# Patient Record
Sex: Female | Born: 1935
Health system: Southern US, Community
[De-identification: ages and names within clinical notes are randomized; demographics above are authoritative.]

## PROBLEM LIST (undated history)

## (undated) DIAGNOSIS — N289 Disorder of kidney and ureter, unspecified: Secondary | ICD-10-CM

## (undated) DIAGNOSIS — R0609 Other forms of dyspnea: Secondary | ICD-10-CM

## (undated) DIAGNOSIS — I10 Essential (primary) hypertension: Secondary | ICD-10-CM

## (undated) DIAGNOSIS — K449 Diaphragmatic hernia without obstruction or gangrene: Secondary | ICD-10-CM

## (undated) DIAGNOSIS — R112 Nausea with vomiting, unspecified: Secondary | ICD-10-CM

## (undated) DIAGNOSIS — J189 Pneumonia, unspecified organism: Secondary | ICD-10-CM

## (undated) DIAGNOSIS — K219 Gastro-esophageal reflux disease without esophagitis: Secondary | ICD-10-CM

## (undated) DIAGNOSIS — C9110 Chronic lymphocytic leukemia of B-cell type not having achieved remission: Secondary | ICD-10-CM

## (undated) DIAGNOSIS — J302 Other seasonal allergic rhinitis: Secondary | ICD-10-CM

## (undated) DIAGNOSIS — E785 Hyperlipidemia, unspecified: Secondary | ICD-10-CM

## (undated) DIAGNOSIS — Z973 Presence of spectacles and contact lenses: Secondary | ICD-10-CM

## (undated) DIAGNOSIS — R351 Nocturia: Secondary | ICD-10-CM

## (undated) DIAGNOSIS — M1990 Unspecified osteoarthritis, unspecified site: Secondary | ICD-10-CM

## (undated) DIAGNOSIS — E039 Hypothyroidism, unspecified: Secondary | ICD-10-CM

## (undated) DIAGNOSIS — N390 Urinary tract infection, site not specified: Secondary | ICD-10-CM

## (undated) DIAGNOSIS — Z9889 Other specified postprocedural states: Secondary | ICD-10-CM

## (undated) DIAGNOSIS — K529 Noninfective gastroenteritis and colitis, unspecified: Secondary | ICD-10-CM

## (undated) DIAGNOSIS — N200 Calculus of kidney: Secondary | ICD-10-CM

## (undated) DIAGNOSIS — Z8489 Family history of other specified conditions: Secondary | ICD-10-CM

## (undated) DIAGNOSIS — IMO0001 Reserved for inherently not codable concepts without codable children: Secondary | ICD-10-CM

## (undated) DIAGNOSIS — R7989 Other specified abnormal findings of blood chemistry: Secondary | ICD-10-CM

## (undated) DIAGNOSIS — R0600 Dyspnea, unspecified: Secondary | ICD-10-CM

## (undated) DIAGNOSIS — C4490 Unspecified malignant neoplasm of skin, unspecified: Secondary | ICD-10-CM

## (undated) HISTORY — DX: Other specified abnormal findings of blood chemistry: R79.89

## (undated) HISTORY — PX: ESOPHAGOGASTRODUODENOSCOPY: SHX1529

## (undated) HISTORY — PX: WISDOM TOOTH EXTRACTION: SHX21

## (undated) HISTORY — DX: Pneumonia, unspecified organism: J18.9

## (undated) HISTORY — DX: Essential (primary) hypertension: I10

## (undated) HISTORY — PX: CYSTOSCOPY W/ STONE MANIPULATION: SHX1427

## (undated) HISTORY — DX: Nocturia: R35.1

## (undated) HISTORY — PX: CYSTOSCOPY WITH STENT PLACEMENT: SHX5790

## (undated) HISTORY — PX: SKIN CANCER EXCISION: SHX779

## (undated) HISTORY — PX: ESOPHAGOGASTRODUODENOSCOPY (EGD) WITH ESOPHAGEAL DILATION: SHX5812

## (undated) HISTORY — DX: Diaphragmatic hernia without obstruction or gangrene: K44.9

## (undated) HISTORY — PX: TONSILLECTOMY: SUR1361

## (undated) HISTORY — DX: Dyspnea, unspecified: R06.00

## (undated) HISTORY — PX: LITHOTRIPSY: SUR834

## (undated) HISTORY — DX: Calculus of kidney: N20.0

## (undated) HISTORY — DX: Other forms of dyspnea: R06.09

## (undated) HISTORY — DX: Noninfective gastroenteritis and colitis, unspecified: K52.9

## (undated) HISTORY — PX: COLONOSCOPY: SHX174

## (undated) HISTORY — DX: Hyperlipidemia, unspecified: E78.5

## (undated) HISTORY — PX: KNEE ARTHROSCOPY: SUR90

---

## 1998-08-25 ENCOUNTER — Emergency Department (HOSPITAL_COMMUNITY): Admission: EM | Admit: 1998-08-25 | Discharge: 1998-08-25 | Payer: Self-pay | Admitting: Emergency Medicine

## 1998-09-19 ENCOUNTER — Other Ambulatory Visit: Admission: RE | Admit: 1998-09-19 | Discharge: 1998-09-19 | Payer: Self-pay | Admitting: *Deleted

## 1998-09-26 ENCOUNTER — Encounter: Payer: Self-pay | Admitting: *Deleted

## 1998-09-26 ENCOUNTER — Ambulatory Visit (HOSPITAL_COMMUNITY): Admission: RE | Admit: 1998-09-26 | Discharge: 1998-09-26 | Payer: Self-pay | Admitting: *Deleted

## 2001-02-20 ENCOUNTER — Ambulatory Visit (HOSPITAL_COMMUNITY): Admission: RE | Admit: 2001-02-20 | Discharge: 2001-02-20 | Payer: Self-pay | Admitting: *Deleted

## 2001-02-20 ENCOUNTER — Encounter: Payer: Self-pay | Admitting: *Deleted

## 2002-12-10 ENCOUNTER — Ambulatory Visit (HOSPITAL_COMMUNITY): Admission: RE | Admit: 2002-12-10 | Discharge: 2002-12-10 | Payer: Self-pay | Admitting: Family Medicine

## 2002-12-10 ENCOUNTER — Encounter: Payer: Self-pay | Admitting: Family Medicine

## 2008-04-26 ENCOUNTER — Emergency Department (HOSPITAL_COMMUNITY): Admission: EM | Admit: 2008-04-26 | Discharge: 2008-04-26 | Payer: Self-pay | Admitting: Emergency Medicine

## 2008-06-24 HISTORY — PX: CATARACT EXTRACTION, BILATERAL: SHX1313

## 2008-08-12 ENCOUNTER — Encounter: Admission: RE | Admit: 2008-08-12 | Discharge: 2008-08-12 | Payer: Self-pay | Admitting: Family Medicine

## 2009-03-01 ENCOUNTER — Other Ambulatory Visit: Admission: RE | Admit: 2009-03-01 | Discharge: 2009-03-01 | Payer: Self-pay | Admitting: Interventional Radiology

## 2009-03-01 ENCOUNTER — Encounter: Admission: RE | Admit: 2009-03-01 | Discharge: 2009-03-01 | Payer: Self-pay | Admitting: Family Medicine

## 2009-03-01 ENCOUNTER — Encounter (INDEPENDENT_AMBULATORY_CARE_PROVIDER_SITE_OTHER): Payer: Self-pay | Admitting: Interventional Radiology

## 2009-04-10 ENCOUNTER — Encounter (HOSPITAL_COMMUNITY): Admission: RE | Admit: 2009-04-10 | Discharge: 2009-06-22 | Payer: Self-pay | Admitting: Internal Medicine

## 2009-09-19 ENCOUNTER — Encounter: Payer: Self-pay | Admitting: Cardiology

## 2010-07-15 ENCOUNTER — Encounter: Payer: Self-pay | Admitting: Family Medicine

## 2010-09-23 DIAGNOSIS — K529 Noninfective gastroenteritis and colitis, unspecified: Secondary | ICD-10-CM

## 2010-09-23 HISTORY — DX: Noninfective gastroenteritis and colitis, unspecified: K52.9

## 2010-09-30 ENCOUNTER — Emergency Department (HOSPITAL_COMMUNITY): Payer: Medicare Other

## 2010-09-30 ENCOUNTER — Emergency Department (HOSPITAL_COMMUNITY)
Admission: EM | Admit: 2010-09-30 | Discharge: 2010-09-30 | Disposition: A | Discharge status: Home / Self Care | Payer: Medicare Other | Attending: Emergency Medicine | Admitting: Emergency Medicine

## 2010-09-30 DIAGNOSIS — K219 Gastro-esophageal reflux disease without esophagitis: Secondary | ICD-10-CM | POA: Insufficient documentation

## 2010-09-30 DIAGNOSIS — Q619 Cystic kidney disease, unspecified: Secondary | ICD-10-CM | POA: Insufficient documentation

## 2010-09-30 DIAGNOSIS — N390 Urinary tract infection, site not specified: Secondary | ICD-10-CM | POA: Insufficient documentation

## 2010-09-30 DIAGNOSIS — K449 Diaphragmatic hernia without obstruction or gangrene: Secondary | ICD-10-CM | POA: Insufficient documentation

## 2010-09-30 DIAGNOSIS — R109 Unspecified abdominal pain: Secondary | ICD-10-CM | POA: Insufficient documentation

## 2010-09-30 DIAGNOSIS — E785 Hyperlipidemia, unspecified: Secondary | ICD-10-CM | POA: Insufficient documentation

## 2010-09-30 DIAGNOSIS — K5730 Diverticulosis of large intestine without perforation or abscess without bleeding: Secondary | ICD-10-CM | POA: Insufficient documentation

## 2010-09-30 DIAGNOSIS — R110 Nausea: Secondary | ICD-10-CM | POA: Insufficient documentation

## 2010-09-30 DIAGNOSIS — I10 Essential (primary) hypertension: Secondary | ICD-10-CM | POA: Insufficient documentation

## 2010-09-30 LAB — CBC
HCT: 35.6 % — ABNORMAL LOW (ref 36.0–46.0)
MCV: 89.9 fL (ref 78.0–100.0)
RBC: 3.96 MIL/uL (ref 3.87–5.11)
WBC: 8.6 10*3/uL (ref 4.0–10.5)

## 2010-09-30 LAB — BASIC METABOLIC PANEL
Calcium: 9.1 mg/dL (ref 8.4–10.5)
GFR calc Af Amer: 59 mL/min — ABNORMAL LOW (ref >60)
GFR calc non Af Amer: 49 mL/min — ABNORMAL LOW (ref >60)
Sodium: 133 mEq/L — ABNORMAL LOW (ref 135–145)

## 2010-09-30 LAB — URINALYSIS, ROUTINE W REFLEX MICROSCOPIC
Bilirubin Urine: NEGATIVE
Glucose, UA: NEGATIVE mg/dL
Hgb urine dipstick: NEGATIVE
Specific Gravity, Urine: 1.012 (ref 1.005–1.030)
pH: 7 (ref 5.0–8.0)

## 2010-09-30 LAB — URINE MICROSCOPIC-ADD ON

## 2010-09-30 LAB — DIFFERENTIAL
Eosinophils Relative: 7 % — ABNORMAL HIGH (ref 0–5)
Lymphocytes Relative: 36 % (ref 12–46)
Lymphs Abs: 3.1 10*3/uL (ref 0.7–4.0)
Neutrophils Relative %: 47 % (ref 43–77)

## 2010-10-01 LAB — URINE CULTURE: Culture  Setup Time: 201204081135

## 2010-10-06 ENCOUNTER — Emergency Department (HOSPITAL_COMMUNITY): Payer: Medicare Other

## 2010-10-06 ENCOUNTER — Inpatient Hospital Stay (HOSPITAL_COMMUNITY)
Admission: EM | Admit: 2010-10-06 | Discharge: 2010-10-13 | DRG: 871 | Disposition: A | Discharge status: Home / Self Care | Payer: Medicare Other | Attending: Internal Medicine | Admitting: Internal Medicine

## 2010-10-06 DIAGNOSIS — R5381 Other malaise: Secondary | ICD-10-CM | POA: Diagnosis present

## 2010-10-06 DIAGNOSIS — A414 Sepsis due to anaerobes: Principal | ICD-10-CM | POA: Diagnosis present

## 2010-10-06 DIAGNOSIS — R109 Unspecified abdominal pain: Secondary | ICD-10-CM | POA: Diagnosis present

## 2010-10-06 DIAGNOSIS — Z7982 Long term (current) use of aspirin: Secondary | ICD-10-CM

## 2010-10-06 DIAGNOSIS — Z87440 Personal history of urinary (tract) infections: Secondary | ICD-10-CM

## 2010-10-06 DIAGNOSIS — D72829 Elevated white blood cell count, unspecified: Secondary | ICD-10-CM | POA: Diagnosis present

## 2010-10-06 DIAGNOSIS — R6521 Severe sepsis with septic shock: Secondary | ICD-10-CM | POA: Diagnosis present

## 2010-10-06 DIAGNOSIS — R4182 Altered mental status, unspecified: Secondary | ICD-10-CM | POA: Diagnosis present

## 2010-10-06 DIAGNOSIS — A419 Sepsis, unspecified organism: Secondary | ICD-10-CM | POA: Diagnosis present

## 2010-10-06 DIAGNOSIS — A0472 Enterocolitis due to Clostridium difficile, not specified as recurrent: Secondary | ICD-10-CM | POA: Diagnosis present

## 2010-10-06 DIAGNOSIS — K219 Gastro-esophageal reflux disease without esophagitis: Secondary | ICD-10-CM | POA: Diagnosis present

## 2010-10-06 DIAGNOSIS — E872 Acidosis, unspecified: Secondary | ICD-10-CM | POA: Diagnosis present

## 2010-10-06 DIAGNOSIS — E860 Dehydration: Secondary | ICD-10-CM | POA: Diagnosis present

## 2010-10-06 DIAGNOSIS — E876 Hypokalemia: Secondary | ICD-10-CM | POA: Diagnosis present

## 2010-10-06 DIAGNOSIS — I10 Essential (primary) hypertension: Secondary | ICD-10-CM | POA: Diagnosis present

## 2010-10-06 DIAGNOSIS — N179 Acute kidney failure, unspecified: Secondary | ICD-10-CM | POA: Diagnosis present

## 2010-10-06 DIAGNOSIS — N390 Urinary tract infection, site not specified: Secondary | ICD-10-CM | POA: Diagnosis present

## 2010-10-06 DIAGNOSIS — D649 Anemia, unspecified: Secondary | ICD-10-CM | POA: Diagnosis not present

## 2010-10-06 DIAGNOSIS — Z9849 Cataract extraction status, unspecified eye: Secondary | ICD-10-CM

## 2010-10-06 LAB — URINALYSIS, ROUTINE W REFLEX MICROSCOPIC
Bilirubin Urine: NEGATIVE
Hgb urine dipstick: NEGATIVE
Ketones, ur: 15 mg/dL — AB
Nitrite: NEGATIVE
pH: 5.5 (ref 5.0–8.0)

## 2010-10-06 LAB — CBC
HCT: 35.6 % — ABNORMAL LOW (ref 36.0–46.0)
MCH: 31 pg (ref 26.0–34.0)
MCV: 90.4 fL (ref 78.0–100.0)
Platelets: 247 10*3/uL (ref 150–400)
RBC: 3.94 MIL/uL (ref 3.87–5.11)

## 2010-10-06 LAB — DIFFERENTIAL
Eosinophils Absolute: 0 10*3/uL (ref 0.0–0.7)
Eosinophils Relative: 0 % (ref 0–5)
Lymphocytes Relative: 12 % (ref 12–46)
Lymphs Abs: 2.8 10*3/uL (ref 0.7–4.0)
Monocytes Relative: 8 % (ref 3–12)
Neutrophils Relative %: 80 % — ABNORMAL HIGH (ref 43–77)

## 2010-10-06 LAB — COMPREHENSIVE METABOLIC PANEL
ALT: 23 U/L (ref 0–35)
AST: 33 U/L (ref 0–37)
Albumin: 3.6 g/dL (ref 3.5–5.2)
Calcium: 9.7 mg/dL (ref 8.4–10.5)
Creatinine, Ser: 1.71 mg/dL — ABNORMAL HIGH (ref 0.4–1.2)
GFR calc Af Amer: 35 mL/min — ABNORMAL LOW (ref >60)
Sodium: 134 mEq/L — ABNORMAL LOW (ref 135–145)
Total Protein: 7.4 g/dL (ref 6.0–8.3)

## 2010-10-06 LAB — URINE MICROSCOPIC-ADD ON

## 2010-10-07 ENCOUNTER — Inpatient Hospital Stay (HOSPITAL_COMMUNITY): Payer: Medicare Other

## 2010-10-07 LAB — URINE CULTURE: Colony Count: 25000

## 2010-10-07 LAB — CBC
HCT: 30.3 % — ABNORMAL LOW (ref 36.0–46.0)
Hemoglobin: 10.2 g/dL — ABNORMAL LOW (ref 12.0–15.0)
MCHC: 33.7 g/dL (ref 30.0–36.0)
MCV: 89.4 fL (ref 78.0–100.0)
RDW: 13.1 % (ref 11.5–15.5)

## 2010-10-07 LAB — COMPREHENSIVE METABOLIC PANEL
ALT: 18 U/L (ref 0–35)
Alkaline Phosphatase: 69 U/L (ref 39–117)
BUN: 21 mg/dL (ref 6–23)
CO2: 19 mEq/L (ref 19–32)
Calcium: 8.6 mg/dL (ref 8.4–10.5)
GFR calc non Af Amer: 36 mL/min — ABNORMAL LOW (ref >60)
Glucose, Bld: 96 mg/dL (ref 70–99)
Sodium: 134 mEq/L — ABNORMAL LOW (ref 135–145)
Total Protein: 6 g/dL (ref 6.0–8.3)

## 2010-10-07 LAB — PHOSPHORUS: Phosphorus: 2.5 mg/dL (ref 2.3–4.6)

## 2010-10-07 LAB — TSH: TSH: 0.85 u[IU]/mL (ref 0.350–4.500)

## 2010-10-07 LAB — MAGNESIUM: Magnesium: 1.2 mg/dL — ABNORMAL LOW (ref 1.5–2.5)

## 2010-10-08 ENCOUNTER — Inpatient Hospital Stay (HOSPITAL_COMMUNITY): Payer: Medicare Other

## 2010-10-08 DIAGNOSIS — R6520 Severe sepsis without septic shock: Secondary | ICD-10-CM

## 2010-10-08 DIAGNOSIS — A0472 Enterocolitis due to Clostridium difficile, not specified as recurrent: Secondary | ICD-10-CM

## 2010-10-08 DIAGNOSIS — R6521 Severe sepsis with septic shock: Secondary | ICD-10-CM

## 2010-10-08 DIAGNOSIS — A419 Sepsis, unspecified organism: Secondary | ICD-10-CM

## 2010-10-08 LAB — COMPREHENSIVE METABOLIC PANEL
Alkaline Phosphatase: 59 U/L (ref 39–117)
BUN: 16 mg/dL (ref 6–23)
Chloride: 114 mEq/L — ABNORMAL HIGH (ref 96–112)
Glucose, Bld: 97 mg/dL (ref 70–99)
Potassium: 3.9 mEq/L (ref 3.5–5.1)
Total Bilirubin: 0.4 mg/dL (ref 0.3–1.2)

## 2010-10-08 LAB — D-DIMER, QUANTITATIVE: D-Dimer, Quant: 1.76 ug/mL-FEU — ABNORMAL HIGH (ref 0.00–0.48)

## 2010-10-08 LAB — DIFFERENTIAL
Eosinophils Absolute: 0 10*3/uL (ref 0.0–0.7)
Lymphocytes Relative: 12 % (ref 12–46)
Monocytes Absolute: 1.9 10*3/uL — ABNORMAL HIGH (ref 0.1–1.0)
Neutrophils Relative %: 80 % — ABNORMAL HIGH (ref 43–77)
WBC Morphology: INCREASED

## 2010-10-08 LAB — BASIC METABOLIC PANEL
CO2: 16 mEq/L — ABNORMAL LOW (ref 19–32)
Chloride: 107 mEq/L (ref 96–112)
Creatinine, Ser: 1.53 mg/dL — ABNORMAL HIGH (ref 0.4–1.2)
GFR calc Af Amer: 39 mL/min — ABNORMAL LOW (ref >60)
Potassium: 3.8 mEq/L (ref 3.5–5.1)
Sodium: 134 mEq/L — ABNORMAL LOW (ref 135–145)

## 2010-10-08 LAB — BRAIN NATRIURETIC PEPTIDE: Pro B Natriuretic peptide (BNP): 202 pg/mL — ABNORMAL HIGH (ref 0.0–100.0)

## 2010-10-08 LAB — APTT: aPTT: 31 seconds (ref 24–37)

## 2010-10-08 LAB — LIPASE, BLOOD: Lipase: 24 U/L (ref 11–59)

## 2010-10-08 LAB — LACTIC ACID, PLASMA: Lactic Acid, Venous: 1.6 mmol/L (ref 0.5–2.2)

## 2010-10-08 LAB — BLOOD GAS, ARTERIAL
Drawn by: 33234
FIO2: 0.21 %
O2 Saturation: 98.7 %
Patient temperature: 98.6

## 2010-10-08 LAB — PROTIME-INR: Prothrombin Time: 16.9 seconds — ABNORMAL HIGH (ref 11.6–15.2)

## 2010-10-08 LAB — CBC
Hemoglobin: 9.9 g/dL — ABNORMAL LOW (ref 12.0–15.0)
Platelets: 188 10*3/uL (ref 150–400)
RBC: 3.25 MIL/uL — ABNORMAL LOW (ref 3.87–5.11)
WBC: 23.9 10*3/uL — ABNORMAL HIGH (ref 4.0–10.5)

## 2010-10-08 LAB — AMYLASE: Amylase: 34 U/L (ref 0–105)

## 2010-10-08 LAB — CARDIAC PANEL(CRET KIN+CKTOT+MB+TROPI): Troponin I: 0.01 ng/mL (ref 0.00–0.06)

## 2010-10-08 LAB — CARBOXYHEMOGLOBIN: Total hemoglobin: 8.3 g/dL — ABNORMAL LOW (ref 12.5–16.0)

## 2010-10-09 DIAGNOSIS — R6521 Severe sepsis with septic shock: Secondary | ICD-10-CM

## 2010-10-09 DIAGNOSIS — R6520 Severe sepsis without septic shock: Secondary | ICD-10-CM

## 2010-10-09 DIAGNOSIS — A0472 Enterocolitis due to Clostridium difficile, not specified as recurrent: Secondary | ICD-10-CM

## 2010-10-09 DIAGNOSIS — A419 Sepsis, unspecified organism: Secondary | ICD-10-CM

## 2010-10-09 LAB — DIFFERENTIAL
Basophils Relative: 0 % (ref 0–1)
Lymphs Abs: 3.9 10*3/uL (ref 0.7–4.0)
Monocytes Relative: 7 % (ref 3–12)
Neutro Abs: 17.2 10*3/uL — ABNORMAL HIGH (ref 1.7–7.7)
Neutrophils Relative %: 74 % (ref 43–77)

## 2010-10-09 LAB — BASIC METABOLIC PANEL
CO2: 14 mEq/L — ABNORMAL LOW (ref 19–32)
Chloride: 117 mEq/L — ABNORMAL HIGH (ref 96–112)
Creatinine, Ser: 1.12 mg/dL (ref 0.4–1.2)
GFR calc Af Amer: 56 mL/min — ABNORMAL LOW (ref >60)
Potassium: 3.5 mEq/L (ref 3.5–5.1)
Sodium: 138 mEq/L (ref 135–145)

## 2010-10-09 LAB — CBC
Hemoglobin: 9.9 g/dL — ABNORMAL LOW (ref 12.0–15.0)
MCH: 30.2 pg (ref 26.0–34.0)
RBC: 3.28 MIL/uL — ABNORMAL LOW (ref 3.87–5.11)
WBC: 23.1 10*3/uL — ABNORMAL HIGH (ref 4.0–10.5)

## 2010-10-10 ENCOUNTER — Inpatient Hospital Stay (HOSPITAL_COMMUNITY): Payer: Medicare Other

## 2010-10-10 LAB — COMPREHENSIVE METABOLIC PANEL
ALT: 24 U/L (ref 0–35)
Alkaline Phosphatase: 65 U/L (ref 39–117)
BUN: 9 mg/dL (ref 6–23)
CO2: 17 mEq/L — ABNORMAL LOW (ref 19–32)
GFR calc non Af Amer: 57 mL/min — ABNORMAL LOW (ref >60)
Glucose, Bld: 83 mg/dL (ref 70–99)
Potassium: 3.4 mEq/L — ABNORMAL LOW (ref 3.5–5.1)
Sodium: 140 mEq/L (ref 135–145)
Total Bilirubin: 0.5 mg/dL (ref 0.3–1.2)

## 2010-10-10 LAB — MAGNESIUM: Magnesium: 1 mg/dL — ABNORMAL LOW (ref 1.5–2.5)

## 2010-10-10 LAB — PHOSPHORUS: Phosphorus: 2 mg/dL — ABNORMAL LOW (ref 2.3–4.6)

## 2010-10-10 LAB — DIFFERENTIAL
Eosinophils Absolute: 0.5 10*3/uL (ref 0.0–0.7)
Eosinophils Relative: 5 % (ref 0–5)
Lymphocytes Relative: 31 % (ref 12–46)
Lymphs Abs: 3.5 10*3/uL (ref 0.7–4.0)
Monocytes Relative: 7 % (ref 3–12)
Neutrophils Relative %: 57 % (ref 43–77)

## 2010-10-10 LAB — CBC
HCT: 26.1 % — ABNORMAL LOW (ref 36.0–46.0)
MCH: 30.4 pg (ref 26.0–34.0)
MCV: 89.1 fL (ref 78.0–100.0)
RBC: 2.93 MIL/uL — ABNORMAL LOW (ref 3.87–5.11)
WBC: 11.1 10*3/uL — ABNORMAL HIGH (ref 4.0–10.5)

## 2010-10-11 ENCOUNTER — Inpatient Hospital Stay (HOSPITAL_COMMUNITY): Payer: Medicare Other

## 2010-10-11 LAB — CBC
HCT: 31.1 % — ABNORMAL LOW (ref 36.0–46.0)
MCH: 30.3 pg (ref 26.0–34.0)
MCHC: 34.7 g/dL (ref 30.0–36.0)
MCV: 87.1 fL (ref 78.0–100.0)
Platelets: 260 10*3/uL (ref 150–400)
RDW: 13.3 % (ref 11.5–15.5)

## 2010-10-11 LAB — BASIC METABOLIC PANEL
BUN: 8 mg/dL (ref 6–23)
Calcium: 8.5 mg/dL (ref 8.4–10.5)
Creatinine, Ser: 0.91 mg/dL (ref 0.4–1.2)
GFR calc non Af Amer: 59 mL/min — ABNORMAL LOW (ref >60)
Glucose, Bld: 91 mg/dL (ref 70–99)
Potassium: 3.3 mEq/L — ABNORMAL LOW (ref 3.5–5.1)

## 2010-10-12 DIAGNOSIS — R609 Edema, unspecified: Secondary | ICD-10-CM

## 2010-10-13 LAB — BASIC METABOLIC PANEL
CO2: 22 mEq/L (ref 19–32)
Calcium: 8.2 mg/dL — ABNORMAL LOW (ref 8.4–10.5)
Creatinine, Ser: 1.03 mg/dL (ref 0.4–1.2)
GFR calc Af Amer: >60 mL/min (ref >60)
GFR calc non Af Amer: 51 mL/min — ABNORMAL LOW (ref >60)
Glucose, Bld: 97 mg/dL (ref 70–99)

## 2010-10-13 LAB — CBC
Hemoglobin: 9.5 g/dL — ABNORMAL LOW (ref 12.0–15.0)
MCH: 30.6 pg (ref 26.0–34.0)
MCHC: 35.1 g/dL (ref 30.0–36.0)
RDW: 13.7 % (ref 11.5–15.5)

## 2010-10-14 LAB — CULTURE, BLOOD (ROUTINE X 2)
Culture  Setup Time: 201204161330
Culture  Setup Time: 201204161330
Culture: NO GROWTH

## 2010-10-16 NOTE — Discharge Summary (Signed)
NAME:  Donna Burch, Donna Burch               ACCOUNT NO.:  [id number]  MEDICAL RECORD NO.:  [id number]           PATIENT TYPE:  I  LOCATION:  4508                         FACILITY:  [facility]  PHYSICIAN:  Thad Ranger, MD       DATE OF BIRTH:  [birth year]  DATE OF ADMISSION:  10/06/2010 DATE OF DISCHARGE:  10/13/2010                        DISCHARGE SUMMARY - REFERRING   PRIMARY CARE PHYSICIAN:  Stacie Acres. White, MD  DISCHARGE DIAGNOSES: 1. Septic shock, resolved. 2. Sepsis secondary to C diff colitis. 3. Altered mental status likely from sepsis and hypotension. 4. Acute renal failure, resolved. 5. Hypokalemia, hypomagnesemia, hyperphosphatemia, replaced     aggressively. 6. Non anion gap metabolic acidosis secondary to C diff colitis and     diarrhea, improved. 7. Dehydration, improved. 8. Severe deconditioning secondary to sepsis and colitis.  CONSULTATIONS:  Pulmonary Critical Care Medicine.  DISCHARGE MEDICATIONS: 1. Potassium 40 mg p.o. daily for 3 days. 2. Florastor 500 mg p.o. b.i.d. 3. Oral vancomycin 125 mg p.o. four times daily for 14 days. 4. Cholestyramine 4 g p.o. daily, to discontinue if no bowel movement     over 24 hours. 5. Atorvastatin. 6. Amlodipine 2.5 mg p.o. daily. 7. Aspirin 81 mg daily. 8. Multivitamin 1 tablet daily. 9. Omeprazole 20 mg p.o. daily.  HISTORY OF PRESENT ILLNESS:  At the time of admission, Ms. [name] is an 77 year old female who presented to the emergency room secondary to left costophrenic angle pain and fever and was diagnosed with UTI and was discharged on Bactrim.  She did not complete her antibiotic course and she presented again with feeling weak and lethargic.  She also had a fever with T-max of 102.2.  She also admitted having three to four bowel movements for the last 2 days prior to admission with nausea and one episode of vomiting.  In the emergency room, the patient was noticed to have white blood cell count of 23.4.  Radiological  data chest x-ray two- view October 06, 2010, no evidence of active pulmonary disease, esophageal hiatal hernia, behind the heart, no changes.  CT abdomen and pelvis without contrast October 07, 2010, stable appearance of the kidneys since previous study, tiny nonobstructing stone in the right kidney.  No evidence of renal obstruction, complex cyst in the lower pole of the right kidney, esophageal hiatal hernia.  Chest x-ray on October 08, 2010, no active disease in one-view.  Chest x-ray on October 08, 2010, interval placement of left IJ without complication, improved radiation in the lung volumes.  Abdominal x-ray on October 10, 2010, nonspecific nonobstructive bowel gas pattern with gas throughout the large and small bowel.  Abdominal x-ray on October 11, 2010, mild improvement in the adynamic ileus.  CT abdomen and pelvis without contrast on October 12, 2010, showed stable appearance of the kidney since previous study again demonstrated a nonobstructing right renal stone complex, cystic lesion in the lower pole of right kidney, vascular calcification in the right renal hilum pyelocaliectasis on the left without ureterectasis or obstructing stone changes, reflux nephropathy.  PERTINENT LAB DIAGNOSTIC DATA:  BMET at the time of discharge sodium 140, potassium  3.4, BUN 9, creatinine 1.0.  CBC white count 7.4, hemoglobin 9.5, hematocrit 27.1, platelets 261.  At the time of admission, white count was 23.5, hemoglobin 12.2, hematocrit 35.6, platelets 247.  Urine culture showed 25,000 colonies.  TSH 0.850.  C diff PCR was positive on October 07, 2010, lactic acid 1.6, procalcitonin 1.66.  D-dimer 1.76.  fecal occult blood test negative.  BRIEF HOSPITALIZATION COURSE:  Ms. Nathaniel is an 77 year old female who presented with fevers, nausea, vomiting, diarrhea and left-sided abdominal pain at the time of admission.  The patient had presented to the ED on October 06, 2010 after 1 month history of UTI and went  through three courses of antibiotics.  The patient was admitted and the patient had a rapid response on October 08, 2010 and was admitted to the ICU. 1. Septic shock.  The patient required Levophed and vasopressin for     the septic shock protocol.  On October 08, 2010, the patient was     noted to have positive C diff via PCR.  She was started on IV     Flagyl and p.o. vancomycin.  She is currently tolerating diet and     the diarrhea has been improving.  She has been started on     cholestyramine with a strict instructions to discontinue if no     bowel movement for more than 24 hours.  This was clearly explained     to the family at the time of discharge.  The patient will continue     oral vancomycin for 14 days.  She will follow up with her primary     care physician, Dr. Laurann Montana within next 2 days.  She was also     placed on Florastor. 2. Hypokalemia, hypomagnesemia, hyperphosphatemia likely secondary to     continuous diarrhea and sepsis.  The electrolyte abnormalities were     aggressively repleted. 3. Acute renal insufficiency secondary to septic shock and hypotension     with non anion gap metabolic acidosis.  Creatinine was 1.7 at the     time of admission.  The patient required vasopressors, IV fluids     which did improve the volume status and creatinine is improved to     1.0 and normalized at the time of discharge.  I have placed her     antihypertensives on hold until her follow-up with the primary care     physician. 4. Left-sided costophrenic angle pain.  The patient was initially     started on Rocephin secondary to the possibility of kidney stone or     pyelonephritis.  However, CT abdomen and pelvis repeated again on     October 12, 2010 did not show any obstructive uropathy on the left     side.  The patient was instructed to follow up with her urologist     Dr. Annabell Howells in 2 weeks.  PHYSICAL EXAMINATION:  VITAL SIGNS:  At time of discharge blood pressure 142/73,  temperature 98.2, pulse 88, respirations 18, O2 sats 90% on room air. GENERAL:  The patient is alert, awake and oriented x3, not in acute distress. HEENT:  Anicteric sclerae.  Pink conjunctivae.  Pupils reactive to light and accommodation.  EOMI. NECK:  Supple.  No lymphopathy.  No JVD. CVS:  S1, S2 clear.  Regular rate and rhythm. CHEST:  Clear to auscultation bilaterally. ABDOMEN:  Soft, nontender, nondistended.  Normal bowel sounds. EXTREMITIES:  No cyanosis, clubbing or edema noted in upper  or lower extremities bilaterally.  Discharge follow-up with Dr. Laurann Montana within next 2 weeks and Dr. Annabell Howells, Urology in next 2 weeks.  DISCHARGE TIME:  35 minutes.     Thad Ranger, MD     RR/MEDQ  D:  10/13/2010  T:  10/13/2010  Job:  [id number]  cc:   Stacie Acres. Cliffton Asters, M.D. Dr. Annabell Howells  Electronically Signed by Andres Labrum Khary Schaben  on 10/16/2010 05:36:10 PM

## 2010-10-17 ENCOUNTER — Telehealth: Payer: Self-pay | Admitting: Cardiology

## 2010-10-17 NOTE — Telephone Encounter (Signed)
Fax: [phone] OV, Echo, EKG

## 2010-10-25 ENCOUNTER — Ambulatory Visit (HOSPITAL_BASED_OUTPATIENT_CLINIC_OR_DEPARTMENT_OTHER)
Admission: RE | Admit: 2010-10-25 | Discharge: 2010-10-25 | Disposition: A | Discharge status: Home / Self Care | Payer: Medicare Other | Source: Ambulatory Visit | Attending: Urology | Admitting: Urology

## 2010-10-25 DIAGNOSIS — Z87442 Personal history of urinary calculi: Secondary | ICD-10-CM | POA: Insufficient documentation

## 2010-10-25 DIAGNOSIS — Z79899 Other long term (current) drug therapy: Secondary | ICD-10-CM | POA: Insufficient documentation

## 2010-10-25 DIAGNOSIS — R1031 Right lower quadrant pain: Secondary | ICD-10-CM | POA: Insufficient documentation

## 2010-10-25 DIAGNOSIS — Z7982 Long term (current) use of aspirin: Secondary | ICD-10-CM | POA: Insufficient documentation

## 2010-10-25 DIAGNOSIS — R1032 Left lower quadrant pain: Secondary | ICD-10-CM | POA: Insufficient documentation

## 2010-10-25 DIAGNOSIS — Z87440 Personal history of urinary (tract) infections: Secondary | ICD-10-CM | POA: Insufficient documentation

## 2010-10-25 LAB — POCT I-STAT 4, (NA,K, GLUC, HGB,HCT): Potassium: 4 mEq/L (ref 3.5–5.1)

## 2010-11-08 NOTE — Op Note (Signed)
NAME:  Donna Burch, Donna Burch               ACCOUNT NO.:  [id number]  MEDICAL RECORD NO.:  [id number]           PATIENT TYPE:  LOCATION:                                 FACILITY:  PHYSICIAN:  Excell Seltzer. Annabell Howells, M.D.    DATE OF BIRTH:  10-07-1926  DATE OF PROCEDURE:  10/25/2010 DATE OF DISCHARGE:                              OPERATIVE REPORT   PROCEDURES: 1. Cystoscopy. 2. Left retrograde pyelogram with interpretation. 3. Insertion of left double-J stent.  PREOPERATIVE DIAGNOSIS:  Left flank pain with possible ureteropelvic junction obstruction.  POSTOPERATIVE DIAGNOSIS:  Left flank pain with probable ureteropelvic junction obstruction.  SURGEON:  Excell Seltzer. Annabell Howells, M.D.  ANESTHESIA:  General.  DRAIN:  A 6-French 24-cm double-J stent.  COMPLICATIONS:  None.  INDICATIONS:  Ms. [name] is an 77 year old white female who has had complaint of moderate left flank pain with radiation to left lower quadrant following a recent UTI.  She had a CT scan done at [facility], did have one previously for history of stones.  She was found to have some increase dilation of left renal pelvis up from 13 mm 2 years ago to 23 mm.  There was concern that she may have a mild UPJ obstruction responsible for pain.  FINDINGS OF PROCEDURE:  She was given gentamicin 80 mg and was taken operating room where she was placed on the table in the lithotomy position.  Sedation was induced.  Perineum and genitalia were prepped with Betadine solution.  She was draped in usual sterile fashion. Cystoscopy was performed using a 22-French scope and 12- and 70-degree lenses.  Examination revealed a normal urethra.  The bladder wall had mild trabeculation.  The mucosa was generally unremarkable but there were a few scattered areas of follicular cystitis and evidence of cystitis cystica at the left ureteral orifice.  She did have a deep bas font which made initial localization of the orifice somewhat difficult. No tumors or stones  were identified.  The left ureteral orifice was cannulated with a 5-French open-ended catheter.  Contrast was instilled which revealed an entirely normal ureter up to the level UPJ where there was some narrowing and the renal pelvis had more of an inverted L-shape than a funnel shape consistent with UPJ obstruction.  The calyces were crisp without blunting but it was felt she did likely have some obstruction.  Once the retrograde pyelogram had been completed, a guidewire was passed to the kidney and a 6-French 24-cm double-J stent was inserted to the kidney under fluoroscopic guidance.  The wire was removed leaving a good coil in the kidney and a good coil in the bladder.  She was noted to have brisk flow through the stent consistent with some pressure in the collecting system.  At this point, the bladder was drained.  B and O suppository had been placed at the beginning of the procedure.  She was taken down from lithotomy position.  Her anesthetic was reversed.  She was moved to the recovery room in stable condition. There were no complications.     Excell Seltzer. Annabell Howells, M.D.     JJW/MEDQ  D:  10/25/2010  T:  10/25/2010  Job:  [id number]  cc:   Stacie Acres. Cliffton Asters, M.D. Fax: [phone]  Electronically Signed by Bjorn Pippin M.D. on 11/08/2010 10:50:50 AM

## 2010-11-09 ENCOUNTER — Other Ambulatory Visit: Payer: Self-pay | Admitting: Urology

## 2010-11-09 DIAGNOSIS — R109 Unspecified abdominal pain: Secondary | ICD-10-CM

## 2010-11-09 DIAGNOSIS — N1330 Unspecified hydronephrosis: Secondary | ICD-10-CM

## 2010-11-14 NOTE — H&P (Signed)
NAME:  Donna Burch, Donna Burch               ACCOUNT NO.:  [id number]  MEDICAL RECORD NO.:  [id number]           PATIENT TYPE:  LOCATION:                                 FACILITY:  PHYSICIAN:  Darvin Dials Bosie Helper, MD      DATE OF BIRTH:  01-Oct-1926  DATE OF ADMISSION:  10/06/2010 DATE OF DISCHARGE:                             HISTORY & PHYSICAL   CHIEF COMPLAINT:  Left costophrenic angle pain and fever and chills for 1 day.  HISTORY OF PRESENT ILLNESS:  This is an 77 year old female with a history of, 1. Kidney stone status post lithotripsy in the past. 2. History of recurrent urinary tract infection.  She has been recently seen in the emergency room secondary to left costophrenic angle pain and fever and diagnosed with urinary tract infection.  The patient was discharged on Bactrim.  She did not complete her antibiotic course as the patient presented again with feeling weak and lethargic, it started since yesterday associated with fever and chills.  T-max 102.2 today as per patient's home thermometer.  The patient also admitted she continued to complaint of pain on her left back.  The patient denies any pain with micturition.  The pain radiated from her left back to left inguinal area.  The patient did not notice any hematuria, did not notice any burning micturition, did not notice any foul-smelling urine.  The patient admitted she also have 3 to 4 bowel movement the last 2 days associated, denies any diarrhea but admitted it is more softer than normal.  The patient's condition associated with nausea and 1 episode of vomiting.  Denies any hemoptysis or hematemesis.  The patient also admitted she has chronic rib pain and back pain.  The patient denies any chest pain.  Denies any shortness of breath and she usually is active lady.  Evaluation in the emergency room did show she has white blood cells of 23.4.  A urinalysis adjusting recurrent urinary tract infection and possibility of acute  renal insufficiency.  History of toxic goiter followed up by Dr. Sharl Ma.  ALLERGIES:  PREDNISONE.  MEDICATIONS: 1. Norvasc 2.5 mg. 2. Aspirin 81 mg. 3. Multivitamin 1 tablet daily. 4. Omeprazole 20 mg. 5. Benicar 40/25 mg 1 tablet daily. 6. Atorvastatin 80 mg p.o. daily.  PAST SURGICAL HISTORY:  The patient has a history of cataract surgery and tonsillectomy.  The patient also has a history of rotator cuff tear.  SOCIAL HISTORY:  She lives alone with her dog.  She has 1 daughter and 2 grown sons.  She denies any smoking.  Denies any alcohol abuse.  Denies any illicit drug abuse.  As I mentioned, she is active with her daily living activities.  FAMILY HISTORY:  Positive for heart disease and diabetes mellitus.  SYSTEMIC REVIEW:  The patient denies any headache, denies any blurring of vision, denies any seizure, denies any numbness or weakness of her extremities.  Also denies any cough.  Denies any chest pain or significant shortness of breath.  During conversation, the patient complained of left rib pain, could not really relate this to her left costophrenic angle pain.  The patient denies any palpitations.  Denies any chest pain.  Denies any skipped beat.  Abdomen, denies any nausea, vomiting, or denies any abdominal pain but complained of nausea and she vomited once.  She had 2 to 3 bowel movements since yesterday, seemed soft.  Other per HPI.  PHYSICAL EXAMINATION:  VITAL SIGNS:  Temperature 99.3, blood pressure 108/41, pulse rate 105, respiratory rate 20, saturating 97% on room air. HEENT:  The patient lying comfortably on bed, not in respiratory distress or shortness of breath.  Pupils equal, reactive to light and accommodation.  Mucosa moist. NECK:  Supple.  No lymphadenopathy. HEART:  S1 and S2 with no added sound. LUNG:  Normal vesicular breathing.  No rales or crackles. ABDOMEN:  Soft, nontender.  Bowel sound positive.  No organomegaly, no rebound.  The patient has  positive costophrenic angle tenderness at the left side.  There is no point tenderness on the patient's back. EXTREMITIES:  No lower limb edema.  Peripheral pulse intact. CNS:  The patient awake, alert, and oriented x3 with no focal neurological finding.  LABORATORY DATA:  CBC, white blood cell 23.5, hemoglobin 12.2, hematocrit 35.6, platelets 247.  Urinalysis cloudy in appearance.  There is ketone of 14 and there is moderate leukocytes.  There is white blood cell 7 to 10 with uric acid crystals.  Sodium 134, potassium 4.7, chloride 105, CO2 19, glucose 93, BUN 24, and creatinine 1.71.  ASSESSMENT:  This is an 77 year old female presented with fever, chills, left hypochondriac pain, most likely secondary to recurrent UTI, who presented with leucocytosis and acute renal failure.  There is possibility of  uric acid stone.  The patient will be admitted to the hospital, started on IV fluid.  I will get urinalysis and blood culture. I also will get CT abdomen and pelvis without contrast as the patient's white blood cell is significantly elevated.  We will also get C. diff PCR as patient has recent ANTIBIOTICS ., .  We will start the patient on Rocephin IV.  The patient has uric acid stone.  We will begin the patient on potassium bicarb and we will start the patient on allopurinol.  We will consider consulting Urology for further evaluation of the patient's ongoing and recurrent urinary tract infection.  We also will be underlying stone need to be removed, but looking over the patient's CT scan from previous last evaluation on September 30, 2010,  the patient has mild 2.7 cm right renal cyst noted with tiny 2-mm nonobstructive right renal stone.  With ongoing leucocytosis and fever and acute renal failure, I will proceed with repeating her CT abdomen and pelvis to evaluate if any complicating bacterial nephritis. If the stone is bigger, may need further evaluation by urologist.  We will hold the  patient's Benicar and Bactrim as it may have a role in the increase in renal insufficiency or worsening creatinine.  Further recommendation as the hospital course progress.     Donna Burch Bosie Helper, MD    HIE/MEDQ  D:  10/06/2010  T:  10/07/2010  Job:  [id number]  cc:   Stacie Acres. Cliffton Asters, M.D.  Electronically Signed by Ebony Cargo MD on 11/14/2010 03:49:48 PM

## 2010-11-15 ENCOUNTER — Encounter (HOSPITAL_COMMUNITY)
Admission: RE | Admit: 2010-11-15 | Discharge: 2010-11-15 | Disposition: A | Discharge status: Home / Self Care | Payer: Medicare Other | Source: Ambulatory Visit | Attending: Urology | Admitting: Urology

## 2010-11-15 ENCOUNTER — Encounter (HOSPITAL_COMMUNITY): Payer: Self-pay

## 2010-11-15 DIAGNOSIS — N1330 Unspecified hydronephrosis: Secondary | ICD-10-CM

## 2010-11-15 DIAGNOSIS — R109 Unspecified abdominal pain: Secondary | ICD-10-CM | POA: Insufficient documentation

## 2010-11-15 HISTORY — DX: Disorder of kidney and ureter, unspecified: N28.9

## 2010-11-15 MED ORDER — FUROSEMIDE 10 MG/ML IJ SOLN: 32.0000 mg | Freq: Once | INTRAMUSCULAR | Status: DC

## 2010-11-15 MED ORDER — TECHNETIUM TC 99M MERTIATIDE
15.0000 | Freq: Once | INTRAVENOUS | Status: AC | PRN
  Administered 2010-11-15: 15 via INTRAVENOUS

## 2010-11-30 ENCOUNTER — Inpatient Hospital Stay (INDEPENDENT_AMBULATORY_CARE_PROVIDER_SITE_OTHER)
Admission: RE | Admit: 2010-11-30 | Discharge: 2010-11-30 | Disposition: A | Discharge status: Home / Self Care | Payer: Medicare Other | Source: Ambulatory Visit

## 2010-11-30 DIAGNOSIS — T6391XA Toxic effect of contact with unspecified venomous animal, accidental (unintentional), initial encounter: Secondary | ICD-10-CM

## 2011-01-23 NOTE — Telephone Encounter (Signed)
Already faxed.

## 2011-03-26 LAB — CBC
Platelets: 280
WBC: 8

## 2011-03-26 LAB — URINALYSIS, ROUTINE W REFLEX MICROSCOPIC
Glucose, UA: NEGATIVE
pH: 6.5

## 2011-03-26 LAB — POCT CARDIAC MARKERS
CKMB, poc: 1.8
Troponin i, poc: <0.05

## 2011-03-26 LAB — DIFFERENTIAL
Lymphocytes Relative: 37
Lymphs Abs: 2.9
Neutro Abs: 4
Neutrophils Relative %: 51

## 2011-03-26 LAB — URINE MICROSCOPIC-ADD ON

## 2011-03-26 LAB — POCT I-STAT, CHEM 8
BUN: 27 — ABNORMAL HIGH
Calcium, Ion: 1.22
Creatinine, Ser: 1.3 — ABNORMAL HIGH
TCO2: 25

## 2011-07-02 DIAGNOSIS — Z1231 Encounter for screening mammogram for malignant neoplasm of breast: Secondary | ICD-10-CM | POA: Diagnosis not present

## 2011-08-19 DIAGNOSIS — E785 Hyperlipidemia, unspecified: Secondary | ICD-10-CM | POA: Diagnosis not present

## 2011-08-19 DIAGNOSIS — I129 Hypertensive chronic kidney disease with stage 1 through stage 4 chronic kidney disease, or unspecified chronic kidney disease: Secondary | ICD-10-CM | POA: Diagnosis not present

## 2011-08-19 DIAGNOSIS — M25519 Pain in unspecified shoulder: Secondary | ICD-10-CM | POA: Diagnosis not present

## 2011-08-19 DIAGNOSIS — Z1331 Encounter for screening for depression: Secondary | ICD-10-CM | POA: Diagnosis not present

## 2011-09-18 DIAGNOSIS — E041 Nontoxic single thyroid nodule: Secondary | ICD-10-CM | POA: Diagnosis not present

## 2011-09-27 DIAGNOSIS — R29890 Loss of height: Secondary | ICD-10-CM | POA: Diagnosis not present

## 2011-09-27 DIAGNOSIS — E049 Nontoxic goiter, unspecified: Secondary | ICD-10-CM | POA: Diagnosis not present

## 2011-09-27 DIAGNOSIS — Z780 Asymptomatic menopausal state: Secondary | ICD-10-CM | POA: Diagnosis not present

## 2011-11-13 DIAGNOSIS — R300 Dysuria: Secondary | ICD-10-CM | POA: Diagnosis not present

## 2011-11-13 DIAGNOSIS — N390 Urinary tract infection, site not specified: Secondary | ICD-10-CM | POA: Diagnosis not present

## 2011-11-13 DIAGNOSIS — N8111 Cystocele, midline: Secondary | ICD-10-CM | POA: Diagnosis not present

## 2011-12-06 DIAGNOSIS — J069 Acute upper respiratory infection, unspecified: Secondary | ICD-10-CM | POA: Diagnosis not present

## 2011-12-06 DIAGNOSIS — R0789 Other chest pain: Secondary | ICD-10-CM | POA: Diagnosis not present

## 2012-01-10 ENCOUNTER — Encounter: Payer: Self-pay | Admitting: Cardiology

## 2012-02-17 DIAGNOSIS — I129 Hypertensive chronic kidney disease with stage 1 through stage 4 chronic kidney disease, or unspecified chronic kidney disease: Secondary | ICD-10-CM | POA: Diagnosis not present

## 2012-02-17 DIAGNOSIS — E785 Hyperlipidemia, unspecified: Secondary | ICD-10-CM | POA: Diagnosis not present

## 2012-03-02 DIAGNOSIS — N8111 Cystocele, midline: Secondary | ICD-10-CM | POA: Diagnosis not present

## 2012-03-31 DIAGNOSIS — Z23 Encounter for immunization: Secondary | ICD-10-CM | POA: Diagnosis not present

## 2012-03-31 DIAGNOSIS — E049 Nontoxic goiter, unspecified: Secondary | ICD-10-CM | POA: Diagnosis not present

## 2012-04-08 ENCOUNTER — Encounter: Payer: Self-pay | Admitting: Obstetrics and Gynecology

## 2012-04-08 ENCOUNTER — Ambulatory Visit (INDEPENDENT_AMBULATORY_CARE_PROVIDER_SITE_OTHER): Payer: Medicare Other | Admitting: Obstetrics and Gynecology

## 2012-04-08 VITALS — BP 126/72 | Ht 64.5 in | Wt 141.0 lb

## 2012-04-08 DIAGNOSIS — N8189 Other female genital prolapse: Secondary | ICD-10-CM

## 2012-04-08 DIAGNOSIS — N200 Calculus of kidney: Secondary | ICD-10-CM | POA: Diagnosis not present

## 2012-04-08 DIAGNOSIS — N390 Urinary tract infection, site not specified: Secondary | ICD-10-CM

## 2012-04-08 LAB — POCT URINALYSIS DIPSTICK
Glucose, UA: NEGATIVE
Nitrite, UA: NEGATIVE
Urobilinogen, UA: NEGATIVE

## 2012-04-08 NOTE — Progress Notes (Signed)
GYN PROBLEM VISIT  [name] is a 78 y.o. year old female,G3P3, who presents for a problem visit. She is referred by Dr.Wrenn of [facility] for evaluation of symptomatic pelvic relaxation.  Subjective: In June, she went on a trip to [place], [place] and just before leaving for that trip noticed that she is having pelvic pressure.  D continued on it but did not walk very much and continued having pelvic pressure for about a week.  She was seen by her urologist to whom she said check a urine sample and sent her here for evaluation.[name]  He described urethral prolapse as well as a cystocele and rectocele with a midline Defect.  Patient however.states that she is better now.she specifically denies pelvic pressure, abdominal pain, difficulty with urination, incomplete emptying, or dysuria. She had a remote history of kidney stones and had recurrent urinary tract infections with the last over a year ago. Pt states that she is unsure of when last pap was done but states that she would prefer to see Dr. Cliffton Asters before getting this done.   Objective:  BP 126/72  Ht 5' 4.5" (1.638 m)  Wt 141 lb (63.957 kg)  BMI 23.83 kg/m2    External genitalia: fat pad loss Vaginal: atrophic mucosa.  No significant prolapse or cystocele could be appreciated today Cervix: atrophic Adnexa: no palpable masses Uterus: normal single, nontender Rectal: good sphincter tone and no masses  Assessment: Symptomatic pelvic relaxation noted on previous examination but now with resolution of anatomic findings and symptoms.   Plan: The patient is offered a pessary cleaning as she has already stated that she is not interested in surgery.  She declines that at this time she is without symptoms. Urine culture and sensitivity Followup will be with her primary care physician unless she has other gynecologic concerns, per her request. Return to office prn if symptoms worsen or fail to improve.   Dierdre Forth,  MD  04/08/2012 2:07 PM

## 2012-04-08 NOTE — Patient Instructions (Signed)
Pamphlet on Pelvic relaxation

## 2012-04-10 LAB — URINE CULTURE: Colony Count: 55000

## 2012-07-03 DIAGNOSIS — Z1231 Encounter for screening mammogram for malignant neoplasm of breast: Secondary | ICD-10-CM | POA: Diagnosis not present

## 2012-08-18 DIAGNOSIS — I129 Hypertensive chronic kidney disease with stage 1 through stage 4 chronic kidney disease, or unspecified chronic kidney disease: Secondary | ICD-10-CM | POA: Diagnosis not present

## 2012-08-18 DIAGNOSIS — E785 Hyperlipidemia, unspecified: Secondary | ICD-10-CM | POA: Diagnosis not present

## 2012-08-18 DIAGNOSIS — K219 Gastro-esophageal reflux disease without esophagitis: Secondary | ICD-10-CM | POA: Diagnosis not present

## 2012-08-18 DIAGNOSIS — E049 Nontoxic goiter, unspecified: Secondary | ICD-10-CM | POA: Diagnosis not present

## 2012-09-14 DIAGNOSIS — R29890 Loss of height: Secondary | ICD-10-CM | POA: Diagnosis not present

## 2012-09-14 DIAGNOSIS — E049 Nontoxic goiter, unspecified: Secondary | ICD-10-CM | POA: Diagnosis not present

## 2012-09-14 DIAGNOSIS — Z780 Asymptomatic menopausal state: Secondary | ICD-10-CM | POA: Diagnosis not present

## 2012-10-15 DIAGNOSIS — M25569 Pain in unspecified knee: Secondary | ICD-10-CM | POA: Diagnosis not present

## 2012-10-15 DIAGNOSIS — I10 Essential (primary) hypertension: Secondary | ICD-10-CM | POA: Diagnosis not present

## 2012-10-16 DIAGNOSIS — M1710 Unilateral primary osteoarthritis, unspecified knee: Secondary | ICD-10-CM | POA: Diagnosis not present

## 2012-10-16 DIAGNOSIS — IMO0002 Reserved for concepts with insufficient information to code with codable children: Secondary | ICD-10-CM | POA: Diagnosis not present

## 2012-11-05 DIAGNOSIS — M25569 Pain in unspecified knee: Secondary | ICD-10-CM | POA: Diagnosis not present

## 2012-11-05 DIAGNOSIS — IMO0002 Reserved for concepts with insufficient information to code with codable children: Secondary | ICD-10-CM | POA: Diagnosis not present

## 2012-11-20 DIAGNOSIS — M1710 Unilateral primary osteoarthritis, unspecified knee: Secondary | ICD-10-CM | POA: Diagnosis not present

## 2012-11-24 DIAGNOSIS — IMO0002 Reserved for concepts with insufficient information to code with codable children: Secondary | ICD-10-CM | POA: Diagnosis not present

## 2012-11-27 DIAGNOSIS — M23305 Other meniscus derangements, unspecified medial meniscus, unspecified knee: Secondary | ICD-10-CM | POA: Diagnosis not present

## 2012-11-27 DIAGNOSIS — M2350 Chronic instability of knee, unspecified knee: Secondary | ICD-10-CM | POA: Diagnosis not present

## 2012-11-27 DIAGNOSIS — IMO0002 Reserved for concepts with insufficient information to code with codable children: Secondary | ICD-10-CM | POA: Diagnosis not present

## 2012-11-27 DIAGNOSIS — M23302 Other meniscus derangements, unspecified lateral meniscus, unspecified knee: Secondary | ICD-10-CM | POA: Diagnosis not present

## 2012-11-27 DIAGNOSIS — M9420 Chondromalacia, unspecified site: Secondary | ICD-10-CM | POA: Diagnosis not present

## 2012-11-27 DIAGNOSIS — M2240 Chondromalacia patellae, unspecified knee: Secondary | ICD-10-CM | POA: Diagnosis not present

## 2012-11-27 DIAGNOSIS — S83289A Other tear of lateral meniscus, current injury, unspecified knee, initial encounter: Secondary | ICD-10-CM | POA: Diagnosis not present

## 2013-02-08 DIAGNOSIS — Z961 Presence of intraocular lens: Secondary | ICD-10-CM | POA: Diagnosis not present

## 2013-02-08 DIAGNOSIS — H04129 Dry eye syndrome of unspecified lacrimal gland: Secondary | ICD-10-CM | POA: Diagnosis not present

## 2013-02-08 DIAGNOSIS — H43819 Vitreous degeneration, unspecified eye: Secondary | ICD-10-CM | POA: Diagnosis not present

## 2013-02-16 DIAGNOSIS — Z23 Encounter for immunization: Secondary | ICD-10-CM | POA: Diagnosis not present

## 2013-02-16 DIAGNOSIS — E049 Nontoxic goiter, unspecified: Secondary | ICD-10-CM | POA: Diagnosis not present

## 2013-02-16 DIAGNOSIS — E785 Hyperlipidemia, unspecified: Secondary | ICD-10-CM | POA: Diagnosis not present

## 2013-02-16 DIAGNOSIS — I129 Hypertensive chronic kidney disease with stage 1 through stage 4 chronic kidney disease, or unspecified chronic kidney disease: Secondary | ICD-10-CM | POA: Diagnosis not present

## 2013-03-04 DIAGNOSIS — M25569 Pain in unspecified knee: Secondary | ICD-10-CM | POA: Diagnosis not present

## 2013-03-25 DIAGNOSIS — E785 Hyperlipidemia, unspecified: Secondary | ICD-10-CM | POA: Diagnosis not present

## 2013-03-25 DIAGNOSIS — Z79899 Other long term (current) drug therapy: Secondary | ICD-10-CM | POA: Diagnosis not present

## 2013-08-03 DIAGNOSIS — K219 Gastro-esophageal reflux disease without esophagitis: Secondary | ICD-10-CM | POA: Diagnosis not present

## 2013-08-03 DIAGNOSIS — E785 Hyperlipidemia, unspecified: Secondary | ICD-10-CM | POA: Diagnosis not present

## 2013-08-03 DIAGNOSIS — N183 Chronic kidney disease, stage 3 unspecified: Secondary | ICD-10-CM | POA: Diagnosis not present

## 2013-08-03 DIAGNOSIS — Z0000 Encounter for general adult medical examination without abnormal findings: Secondary | ICD-10-CM | POA: Diagnosis not present

## 2013-08-03 DIAGNOSIS — Z23 Encounter for immunization: Secondary | ICD-10-CM | POA: Diagnosis not present

## 2013-08-03 DIAGNOSIS — J069 Acute upper respiratory infection, unspecified: Secondary | ICD-10-CM | POA: Diagnosis not present

## 2013-08-03 DIAGNOSIS — I129 Hypertensive chronic kidney disease with stage 1 through stage 4 chronic kidney disease, or unspecified chronic kidney disease: Secondary | ICD-10-CM | POA: Diagnosis not present

## 2013-08-03 DIAGNOSIS — N951 Menopausal and female climacteric states: Secondary | ICD-10-CM | POA: Diagnosis not present

## 2013-08-30 DIAGNOSIS — Z780 Asymptomatic menopausal state: Secondary | ICD-10-CM | POA: Diagnosis not present

## 2013-08-30 DIAGNOSIS — M810 Age-related osteoporosis without current pathological fracture: Secondary | ICD-10-CM | POA: Diagnosis not present

## 2013-09-10 DIAGNOSIS — M810 Age-related osteoporosis without current pathological fracture: Secondary | ICD-10-CM | POA: Diagnosis not present

## 2013-09-14 DIAGNOSIS — E049 Nontoxic goiter, unspecified: Secondary | ICD-10-CM | POA: Diagnosis not present

## 2013-09-16 ENCOUNTER — Other Ambulatory Visit: Payer: Self-pay | Admitting: Family Medicine

## 2013-10-06 ENCOUNTER — Other Ambulatory Visit (HOSPITAL_COMMUNITY): Payer: Self-pay | Admitting: Family Medicine

## 2013-10-06 ENCOUNTER — Ambulatory Visit (HOSPITAL_COMMUNITY)
Admission: RE | Admit: 2013-10-06 | Discharge: 2013-10-06 | Disposition: A | Discharge status: Home / Self Care | Payer: Medicare Other | Source: Ambulatory Visit | Attending: Family Medicine | Admitting: Family Medicine

## 2013-10-06 ENCOUNTER — Encounter (HOSPITAL_COMMUNITY): Payer: Self-pay

## 2013-10-06 DIAGNOSIS — M810 Age-related osteoporosis without current pathological fracture: Secondary | ICD-10-CM | POA: Diagnosis not present

## 2013-10-06 MED ORDER — ZOLEDRONIC ACID 5 MG/100ML IV SOLN
5.0000 mg | Freq: Once | INTRAVENOUS | Status: AC
  Administered 2013-10-06: 5 mg via INTRAVENOUS
  Filled 2013-10-06: qty 100

## 2013-10-06 MED ORDER — SODIUM CHLORIDE 0.9 % IV SOLN
Freq: Once | INTRAVENOUS | Status: AC
  Administered 2013-10-06: 13:00:00 via INTRAVENOUS

## 2013-10-06 NOTE — Discharge Instructions (Signed)
Drink  Fluids/water as tolerated over the next 72 hours °Tylenol or ibuprofen OTC as directed °Continue Calcium and Vit D as directed by your MD ° °Zoledronic Acid injection (Paget's Disease, Osteoporosis) °What is this medicine? °ZOLEDRONIC ACID (ZOE le dron ik AS id) lowers the amount of calcium loss from bone. It is used to treat Paget's disease and osteoporosis in women. °This medicine may be used for other purposes; ask your health care provider or pharmacist if you have questions. °COMMON BRAND NAME(S): Reclast, Zometa °What should I tell my health care provider before I take this medicine? °They need to know if you have any of these conditions: °-aspirin-sensitive asthma °-cancer, especially if you are receiving medicines used to treat cancer °-dental disease or wear dentures °-infection °-kidney disease °-low levels of calcium in the blood °-past surgery on the parathyroid gland or intestines °-receiving corticosteroids like dexamethasone or prednisone °-an unusual or allergic reaction to zoledronic acid, other medicines, foods, dyes, or preservatives °-pregnant or trying to get pregnant °-breast-feeding °How should I use this medicine? °This medicine is for infusion into a vein. It is given by a health care professional in a hospital or clinic setting. °Talk to your pediatrician regarding the use of this medicine in children. This medicine is not approved for use in children. °Overdosage: If you think you have taken too much of this medicine contact a poison control center or emergency room at once. °NOTE: This medicine is only for you. Do not share this medicine with others. °What if I miss a dose? °It is important not to miss your dose. Call your doctor or health care professional if you are unable to keep an appointment. °What may interact with this medicine? °-certain antibiotics given by injection °-NSAIDs, medicines for pain and inflammation, like ibuprofen or naproxen °-some diuretics like  bumetanide, furosemide °-teriparatide °This list may not describe all possible interactions. Give your health care provider a list of all the medicines, herbs, non-prescription drugs, or dietary supplements you use. Also tell them if you smoke, drink alcohol, or use illegal drugs. Some items may interact with your medicine. °What should I watch for while using this medicine? °Visit your doctor or health care professional for regular checkups. It may be some time before you see the benefit from this medicine. Do not stop taking your medicine unless your doctor tells you to. Your doctor may order blood tests or other tests to see how you are doing. °Women should inform their doctor if they wish to become pregnant or think they might be pregnant. There is a potential for serious side effects to an unborn child. Talk to your health care professional or pharmacist for more information. °You should make sure that you get enough calcium and vitamin D while you are taking this medicine. Discuss the foods you eat and the vitamins you take with your health care professional. °Some people who take this medicine have severe bone, joint, and/or muscle pain. This medicine may also increase your risk for jaw problems or a broken thigh bone. Tell your doctor right away if you have severe pain in your jaw, bones, joints, or muscles. Tell your doctor if you have any pain that does not go away or that gets worse. °Tell your dentist and dental surgeon that you are taking this medicine. You should not have major dental surgery while on this medicine. See your dentist to have a dental exam and fix any dental problems before starting this medicine. Take good care   care of your teeth while on this medicine. Make sure you see your dentist for regular follow-up appointments. What side effects may I notice from receiving this medicine? Side effects that you should report to your doctor or health care professional as soon as possible: -allergic  reactions like skin rash, itching or hives, swelling of the face, lips, or tongue -anxiety, confusion, or depression -breathing problems -changes in vision -eye pain -feeling faint or lightheaded, falls -jaw pain, especially after dental work -mouth sores -muscle cramps, stiffness, or weakness -trouble passing urine or change in the amount of urine Side effects that usually do not require medical attention (report to your doctor or health care professional if they continue or are bothersome): -bone, joint, or muscle pain -constipation -diarrhea -fever -hair loss -irritation at site where injected -loss of appetite -nausea, vomiting -stomach upset -trouble sleeping -trouble swallowing -weak or tired This list may not describe all possible side effects. Call your doctor for medical advice about side effects. You may report side effects to FDA at 1-800-FDA-1088. Where should I keep my medicine? This drug is given in a hospital or clinic and will not be stored at home. NOTE: This sheet is a summary. It may not cover all possible information. If you have questions about this medicine, talk to your doctor, pharmacist, or health care provider.  2014, [employer]. (2012-11-23 10:03:48)

## 2014-02-08 DIAGNOSIS — E559 Vitamin D deficiency, unspecified: Secondary | ICD-10-CM | POA: Diagnosis not present

## 2014-02-08 DIAGNOSIS — E785 Hyperlipidemia, unspecified: Secondary | ICD-10-CM | POA: Diagnosis not present

## 2014-02-08 DIAGNOSIS — N183 Chronic kidney disease, stage 3 unspecified: Secondary | ICD-10-CM | POA: Diagnosis not present

## 2014-02-08 DIAGNOSIS — I129 Hypertensive chronic kidney disease with stage 1 through stage 4 chronic kidney disease, or unspecified chronic kidney disease: Secondary | ICD-10-CM | POA: Diagnosis not present

## 2014-02-08 DIAGNOSIS — Z23 Encounter for immunization: Secondary | ICD-10-CM | POA: Diagnosis not present

## 2014-02-08 DIAGNOSIS — M810 Age-related osteoporosis without current pathological fracture: Secondary | ICD-10-CM | POA: Diagnosis not present

## 2014-03-17 DIAGNOSIS — R5383 Other fatigue: Secondary | ICD-10-CM | POA: Diagnosis not present

## 2014-03-17 DIAGNOSIS — E041 Nontoxic single thyroid nodule: Secondary | ICD-10-CM | POA: Diagnosis not present

## 2014-03-17 DIAGNOSIS — R6881 Early satiety: Secondary | ICD-10-CM | POA: Diagnosis not present

## 2014-03-17 DIAGNOSIS — R5381 Other malaise: Secondary | ICD-10-CM | POA: Diagnosis not present

## 2014-03-17 DIAGNOSIS — E049 Nontoxic goiter, unspecified: Secondary | ICD-10-CM | POA: Diagnosis not present

## 2014-03-21 ENCOUNTER — Other Ambulatory Visit: Payer: Self-pay | Admitting: Family Medicine

## 2014-03-21 DIAGNOSIS — R634 Abnormal weight loss: Secondary | ICD-10-CM

## 2014-03-25 ENCOUNTER — Ambulatory Visit
Admission: RE | Admit: 2014-03-25 | Discharge: 2014-03-25 | Disposition: A | Discharge status: Home / Self Care | Payer: Medicare Other | Source: Ambulatory Visit | Attending: Family Medicine | Admitting: Family Medicine

## 2014-03-25 DIAGNOSIS — I714 Abdominal aortic aneurysm, without rupture: Secondary | ICD-10-CM | POA: Diagnosis not present

## 2014-03-25 DIAGNOSIS — R634 Abnormal weight loss: Secondary | ICD-10-CM

## 2014-03-25 DIAGNOSIS — K8020 Calculus of gallbladder without cholecystitis without obstruction: Secondary | ICD-10-CM | POA: Diagnosis not present

## 2014-03-25 DIAGNOSIS — N2889 Other specified disorders of kidney and ureter: Secondary | ICD-10-CM | POA: Diagnosis not present

## 2014-03-25 DIAGNOSIS — K449 Diaphragmatic hernia without obstruction or gangrene: Secondary | ICD-10-CM | POA: Diagnosis not present

## 2014-03-25 MED ORDER — IOHEXOL 300 MG/ML  SOLN
100.0000 mL | Freq: Once | INTRAMUSCULAR | Status: AC | PRN
  Administered 2014-03-25: 100 mL via INTRAVENOUS

## 2014-03-26 ENCOUNTER — Encounter (HOSPITAL_COMMUNITY): Payer: Self-pay | Admitting: Emergency Medicine

## 2014-03-26 ENCOUNTER — Emergency Department (HOSPITAL_COMMUNITY)
Admission: EM | Admit: 2014-03-26 | Discharge: 2014-03-26 | Disposition: A | Discharge status: Home / Self Care | Payer: Medicare Other | Attending: Emergency Medicine | Admitting: Emergency Medicine

## 2014-03-26 ENCOUNTER — Emergency Department (HOSPITAL_COMMUNITY): Payer: Medicare Other

## 2014-03-26 DIAGNOSIS — K449 Diaphragmatic hernia without obstruction or gangrene: Secondary | ICD-10-CM | POA: Diagnosis not present

## 2014-03-26 DIAGNOSIS — Z8701 Personal history of pneumonia (recurrent): Secondary | ICD-10-CM | POA: Diagnosis not present

## 2014-03-26 DIAGNOSIS — R079 Chest pain, unspecified: Secondary | ICD-10-CM | POA: Insufficient documentation

## 2014-03-26 DIAGNOSIS — E785 Hyperlipidemia, unspecified: Secondary | ICD-10-CM | POA: Insufficient documentation

## 2014-03-26 DIAGNOSIS — R531 Weakness: Secondary | ICD-10-CM | POA: Diagnosis not present

## 2014-03-26 DIAGNOSIS — R221 Localized swelling, mass and lump, neck: Secondary | ICD-10-CM | POA: Diagnosis not present

## 2014-03-26 DIAGNOSIS — Z7982 Long term (current) use of aspirin: Secondary | ICD-10-CM | POA: Diagnosis not present

## 2014-03-26 DIAGNOSIS — Z79899 Other long term (current) drug therapy: Secondary | ICD-10-CM | POA: Diagnosis not present

## 2014-03-26 DIAGNOSIS — R0600 Dyspnea, unspecified: Secondary | ICD-10-CM | POA: Diagnosis not present

## 2014-03-26 DIAGNOSIS — Z87442 Personal history of urinary calculi: Secondary | ICD-10-CM | POA: Diagnosis not present

## 2014-03-26 DIAGNOSIS — Z8719 Personal history of other diseases of the digestive system: Secondary | ICD-10-CM | POA: Diagnosis not present

## 2014-03-26 DIAGNOSIS — E079 Disorder of thyroid, unspecified: Secondary | ICD-10-CM | POA: Diagnosis not present

## 2014-03-26 DIAGNOSIS — I10 Essential (primary) hypertension: Secondary | ICD-10-CM | POA: Insufficient documentation

## 2014-03-26 DIAGNOSIS — R0602 Shortness of breath: Secondary | ICD-10-CM | POA: Insufficient documentation

## 2014-03-26 DIAGNOSIS — R1310 Dysphagia, unspecified: Secondary | ICD-10-CM | POA: Diagnosis present

## 2014-03-26 DIAGNOSIS — Z87440 Personal history of urinary (tract) infections: Secondary | ICD-10-CM | POA: Diagnosis not present

## 2014-03-26 LAB — CBC WITH DIFFERENTIAL/PLATELET
BASOS ABS: 0 10*3/uL (ref 0.0–0.1)
Basophils Relative: 0 % (ref 0–1)
EOS PCT: 3 % (ref 0–5)
Eosinophils Absolute: 0.3 10*3/uL (ref 0.0–0.7)
HEMATOCRIT: 34.5 % — AB (ref 36.0–46.0)
Hemoglobin: 11.8 g/dL — ABNORMAL LOW (ref 12.0–15.0)
LYMPHS ABS: 6 10*3/uL — AB (ref 0.7–4.0)
LYMPHS PCT: 57 % — AB (ref 12–46)
MCH: 30.4 pg (ref 26.0–34.0)
MCHC: 34.2 g/dL (ref 30.0–36.0)
MCV: 88.9 fL (ref 78.0–100.0)
MONO ABS: 0.7 10*3/uL (ref 0.1–1.0)
Monocytes Relative: 6 % (ref 3–12)
NEUTROS ABS: 3.6 10*3/uL (ref 1.7–7.7)
Neutrophils Relative %: 34 % — ABNORMAL LOW (ref 43–77)
Platelets: 230 10*3/uL (ref 150–400)
RBC: 3.88 MIL/uL (ref 3.87–5.11)
RDW: 12.9 % (ref 11.5–15.5)
WBC: 10.5 10*3/uL (ref 4.0–10.5)

## 2014-03-26 LAB — I-STAT CHEM 8, ED
BUN: 16 mg/dL (ref 6–23)
CHLORIDE: 105 meq/L (ref 96–112)
CREATININE: 1 mg/dL (ref 0.50–1.10)
Calcium, Ion: 1.15 mmol/L (ref 1.13–1.30)
Glucose, Bld: 96 mg/dL (ref 70–99)
HCT: 36 % (ref 36.0–46.0)
Hemoglobin: 12.2 g/dL (ref 12.0–15.0)
Potassium: 3.8 mEq/L (ref 3.7–5.3)
SODIUM: 139 meq/L (ref 137–147)
TCO2: 21 mmol/L (ref 0–100)

## 2014-03-26 LAB — BASIC METABOLIC PANEL
ANION GAP: 13 (ref 5–15)
BUN: 17 mg/dL (ref 6–23)
CALCIUM: 9.7 mg/dL (ref 8.4–10.5)
CHLORIDE: 101 meq/L (ref 96–112)
CO2: 22 meq/L (ref 19–32)
CREATININE: 1.01 mg/dL (ref 0.50–1.10)
GFR calc Af Amer: 57 mL/min — ABNORMAL LOW (ref >90)
GFR calc non Af Amer: 49 mL/min — ABNORMAL LOW (ref >90)
GLUCOSE: 98 mg/dL (ref 70–99)
Potassium: 4.2 mEq/L (ref 3.7–5.3)
SODIUM: 136 meq/L — AB (ref 137–147)

## 2014-03-26 NOTE — ED Notes (Signed)
Pt sent by PMD. Pt had a CT exam of throat done today. Pt reports difficulty swallowing. Difficulty swallowing x 2 weeks. Worse over past few days. States she is able to swallow water, but it takes it a long time to go down. Pt has some soreness on palpation to mid upper abdomen. C/o shortness of breath since this morning, worse now. No acute distress. A/o x 4. Skin warm and dry.

## 2014-03-26 NOTE — ED Provider Notes (Signed)
CSN: [id number]     Arrival date & time 03/26/14  1801 History   First MD Initiated Contact with Patient 03/26/14 1803     Chief Complaint  Patient presents with  . Dysphagia  . Shortness of Breath     (Consider location/radiation/quality/duration/timing/severity/associated sxs/prior Treatment) HPI Comments: Pt presents with difficulty swallowing and SOB.  She states over the last 2-3 weeks she's had trouble swallowing. She feels like food gets stuck in her esophagus. She exhibits a liquids but she feels like it's going down slow. She feels short of breath because of a feeling of a lump in the center of her chest. She denies any cough or chest congestion. She has had some anorexia. She denies any other chest pain or discomfort. She denies any nausea or vomiting. She had a recent CT scan of her abdomen and pelvis for these symptoms which showed a large hiatal hernia and a mass on her left kidney which cannot be further characterized. She also has a small infrarenal aortic aneurysm. She went to a walk-in clinic today for these symptoms and had a chest x-ray which she was told showed a possible mass in her chest and was sent over here for further evaluation.   Past Medical History  Diagnosis Date  . Renal insufficiency   . Elevated brain natriuretic peptide (BNP) level     Slight  . Hiatal hernia   . Hypertension   . Hyperlipidemia   . Pneumonia     Required hospital stay at the time  . Nephrolithiasis   . Nocturia     x3  . Urinary tract infection   . Dyspnea on exertion     Improved with exercise  . Gastroenteritis 09/2010    c. dificile.  hospitalized in ICU x 3 days   Past Surgical History  Procedure Laterality Date  . Cataract extraction, bilateral  2010  . Wisdom tooth extraction    . Tonsillectomy     Family History  Problem Relation Age of Onset  . Heart attack Mother   . Hyperlipidemia Mother   . Asthma Father   . Diabetes Father    History  Substance Use Topics   . Smoking status: Never Smoker   . Smokeless tobacco: Never Used  . Alcohol Use: No   OB History   Grav Para Term Preterm Abortions TAB SAB Ect Mult Living   3 3        3      Review of Systems  Constitutional: Positive for appetite change. Negative for fever, chills, diaphoresis and fatigue.  HENT: Negative for congestion, rhinorrhea and sneezing.   Eyes: Negative.   Respiratory: Positive for shortness of breath. Negative for cough and chest tightness.   Cardiovascular: Positive for chest pain. Negative for leg swelling.  Gastrointestinal: Negative for nausea, vomiting, abdominal pain, diarrhea and blood in stool.  Genitourinary: Negative for frequency, hematuria, flank pain and difficulty urinating.  Musculoskeletal: Negative for arthralgias and back pain.  Skin: Negative for rash.  Neurological: Negative for dizziness, speech difficulty, weakness, numbness and headaches.      Allergies  Prednisone and Reclast  Home Medications   Prior to Admission medications   Medication Sig Start Date End Date Taking? Authorizing Provider  amLODipine (NORVASC) 5 MG tablet Take 5 mg by mouth daily.   Yes Historical Provider, MD  aspirin 81 MG tablet Take 81 mg by mouth daily.   Yes Historical Provider, MD  atorvastatin (LIPITOR) 80 MG tablet Take 80 mg  by mouth daily.   Yes Historical Provider, MD  B Complex-C (SUPER B COMPLEX PO) Take by mouth.   Yes Historical Provider, MD  cholecalciferol (VITAMIN D) 1000 UNITS tablet Take 2,000 Units by mouth 2 (two) times daily.    Yes Historical Provider, MD  magnesium chloride (SLOW-MAG) 64 MG TBEC SR tablet Take 1 tablet by mouth daily.    Yes Historical Provider, MD  omeprazole (PRILOSEC) 20 MG capsule Take 20 mg by mouth daily.   Yes Historical Provider, MD  Probiotic Product ([place]) Take 1 capsule by mouth daily.   Yes Historical Provider, MD  valsartan-hydrochlorothiazide (DIOVAN-HCT) 160-12.5 MG per tablet Take 1 tablet by  mouth daily.   Yes Historical Provider, MD   BP 141/67  Pulse 78  Temp(Src) 98.4 F (36.9 C) (Oral)  Resp 26  SpO2 99% Physical Exam  Constitutional: She is oriented to person, place, and time. She appears well-developed and well-nourished.  HENT:  Head: Normocephalic and atraumatic.  Eyes: Pupils are equal, round, and reactive to light.  Neck: Normal range of motion. Neck supple.  Cardiovascular: Normal rate, regular rhythm and normal heart sounds.   Pulmonary/Chest: Effort normal and breath sounds normal. No respiratory distress. She has no wheezes. She has no rales. She exhibits no tenderness.  Abdominal: Soft. Bowel sounds are normal. There is no tenderness. There is no rebound and no guarding.  Musculoskeletal: Normal range of motion. She exhibits no edema.  Lymphadenopathy:    She has no cervical adenopathy.  Neurological: She is alert and oriented to person, place, and time.  Skin: Skin is warm and dry. No rash noted.  Psychiatric: She has a normal mood and affect.    ED Course  Procedures (including critical care time) Labs Review Labs Reviewed  CBC WITH DIFFERENTIAL - Abnormal; Notable for the following:    Hemoglobin 11.8 (*)    HCT 34.5 (*)    Neutrophils Relative % 34 (*)    Lymphocytes Relative 57 (*)    Lymphs Abs 6.0 (*)    All other components within normal limits  BASIC METABOLIC PANEL - Abnormal; Notable for the following:    Sodium 136 (*)    GFR calc non Af Amer 49 (*)    GFR calc Af Amer 57 (*)    All other components within normal limits  I-STAT CHEM 8, ED    Imaging Review Ct Chest Wo Contrast  03/26/2014   CLINICAL DATA:  Dysphagia for 5 days, history of goiter, known hiatal hernia, shortness of breath  EXAM: CT CHEST WITHOUT CONTRAST  TECHNIQUE: Multidetector CT imaging of the chest was performed following the standard protocol without IV contrast.  COMPARISON:  Chest radiograph performed earlier today  FINDINGS: Large heterogeneous mass in the  neck and upper mediastinum on the right displaces the trachea toward the left side and causes mild tracheal narrowing. Mass measures 52 x 60 x 42 mm and appears to arise from the thyroid.  Moderate calcification of the aorta. Moderate hiatal hernia. No evidence of significant hilar or mediastinal lymphadenopathy. No pleural or pericardial effusion.  Lungs are clear with no evidence of infiltrate or consolidation. Images to the upper abdomen are unremarkable, with dense material within the gallbladder lumen consistent with vicarious excretion of contrast from CT scan performed 03/25/2014. No acute musculoskeletal findings.  IMPRESSION: 1. Large thyroid mass causes mass effect on the trachea as described above. Thyroid malignancy not excluded and thyroid ultrasound recommended. 2. Moderate hiatal hernia.  Electronically Signed   By: Skipper Cliche M.D.   On: 03/26/2014 20:01   Ct Abdomen Pelvis W Contrast  03/25/2014   CLINICAL DATA:  Upper abdominal pain and nausea; unexplained weight loss  EXAM: CT ABDOMEN AND PELVIS WITH CONTRAST  TECHNIQUE: Multidetector CT imaging of the abdomen and pelvis was performed using the standard protocol following bolus administration of intravenous contrast. Oral contrast was also administered.  CONTRAST:  129mL OMNIPAQUE IOHEXOL 300 MG/ML  SOLN  COMPARISON:  October 07, 2010 and October 11, 2010.  FINDINGS: There is a sizable hiatal type hernia. There is scarring in both lung bases. There is no bibasilar airspace consolidation.  There is evidence of hepatic steatosis. No focal liver lesions are identified. The gallbladder contains small calculi. Gallbladder wall is not thickened. There is no appreciable biliary duct dilatation.  Spleen, pancreas, and adrenals appear normal.  There is scarring in the lower pole the right kidney. There is a prominent extrarenal pelvis on the right. There is a small calcified right renal artery aneurysm at the level of the right renal pelvis measuring 7  x 7 mm, stable. There is a stable cyst arising from the lower pole of the right kidney measuring 4.0 x 3.6 cm. There is no appreciable right renal hydronephrosis. There is no intrarenal or ureteral calculus on the right.  On the left, there is a prominent extrarenal pelvis. There is some distortion in the posterior renal pelvis on the left due to a mass measuring 2.1 x 1.6 cm which has attenuation values higher than expected with a cyst. No other mass is seen on the left. There is no appreciable renal or ureteral calculus on the left.  There is aneurysmal dilatation in the mid to distal abdominal aorta inferior to the superior mesenteric and renal arteries with a maximum transverse diameter of 3.0 by 2.5 cm. There is no periaortic fluid. Atherosclerotic change is seen in the aorta and iliac arteries. There is no common iliac artery aneurysm.  In the pelvis, the urinary bladder is midline. There is air noted within the urinary bladder. The rectum is distended with air. There is no pelvic mass or fluid. Terminal ileum appears normal. Appendix is not seen. There is no periappendiceal region inflammation.  There is diffuse stool throughout the colon.  There is no bowel obstruction. No free air or portal venous air. There is no ascites, adenopathy, or abscess in the abdomen or pelvis. There is degenerative change in the lower thoracic and lumbar spine regions. There are no blastic or lytic bone lesions.  IMPRESSION: There is air in the urinary bladder. If patient has not been catheterized recently, infectious etiology must be of concern. Question vesicoenteric or vesicovaginal fistula as a potential etiology for this air. An actual fistula is not seen on this study currently.  Mass in the left kidney distorting the renal pelvis which cannot be classified as a simple cyst. Further evaluation warranted. Further evaluation with pre and post contrast MRI or CT should be considered. MRI is preferred in younger patients (due  to lack of ionizing radiation) and for evaluating calcified lesion(s).  Small distal abdominal aortic aneurysm.  No periaortic fluid.  Small stable right renal artery aneurysm at the level of the right renal pelvis. Scarring right kidney.  Hepatic steatosis.  Stable fairly sizable hiatal hernia.  Diffuse stool throughout colon.  Question a degree of constipation.  Cholelithiasis.   Electronically Signed   By: Lowella Grip M.D.   On: 03/25/2014  09:35   Dg Chest Port 1 View  03/26/2014   CLINICAL DATA:  Dysphasia, shortness of breath, and cough today, medicated hypertension  EXAM: PORTABLE CHEST - 1 VIEW  COMPARISON:  03/26/2014, 10/08/2010  FINDINGS: Heart size and vascular pattern are normal. Calcified and uncoiled aortic arch stable. Lungs clear. Inferior central mediastinal density appears separate from the aorta and likely represents a hiatal hernia. This appears similar to the prior study. Furthermore, CT scan performed April 2012 demonstrates a moderate hiatal hernia. Displacement of tracheal air column towards the left side is stable from 2012 as well.  IMPRESSION: Stable moderate hiatal hernia. Moderate leftward deviation of the trachea, also stable from numerous prior studies dating back to 2012.   Electronically Signed   By: Skipper Cliche M.D.   On: 03/26/2014 18:54     EKG Interpretation None      Date: 03/26/2014  Rate: 93  Rhythm: normal sinus rhythm  QRS Axis: normal  Intervals: normal  ST/T Wave abnormalities: normal  Conduction Disutrbances:none  Narrative Interpretation:   Old EKG Reviewed: none available   MDM   Final diagnoses:  Thyroid mass    Urine reviewed from walk in clinic negative.  Patient with a rather large mass adjacent to her thyroid gland. I had a long discussion with the patient whether she preferred to be admitted to the hospital or have an outpatient workup. She feels that she's able to take enough fluids and to be discharged. She has no increased  work of breathing or current shortness of breath. Oxygen saturation is 100%. She prefers to go home. I feel this is appropriate. I suggested that she start Ensure shakes. I advised her just to maintain a soft diet at this point. I also contacted her primary care physician, Dr. Dema Severin who feels comfortable arranging outpatient studies such as a thyroid ultrasound and possible biopsy of the mass. I also discussed with the patient her abnormal CT of the abdomen and pelvis which was recently done as outpatient. I advised patient to return here if she has any worsening shortness of breath or difficulty swallowing.  Malvin Johns, MD 03/26/14 2053

## 2014-03-26 NOTE — ED Notes (Signed)
Pt verbalized understanding of d/c instructions and directions for follow-up care. Pt voiced no additional questions or concerns.

## 2014-03-26 NOTE — Discharge Instructions (Signed)
YOU NEED TO RETURN TO THE ED IF YOU HAVE WORSENING SHORTNESS OF BREATH OR DIFFICULTY SWALLOWING.  DR WHITE IS GOING TO ARRANGE FOLLOW UP IMAGING STUDIES NEXT WEEK.

## 2014-03-26 NOTE — ED Notes (Signed)
MD at bedside. Dr. Belfi at bedside.  

## 2014-03-28 ENCOUNTER — Other Ambulatory Visit: Payer: Self-pay | Admitting: Internal Medicine

## 2014-03-28 ENCOUNTER — Other Ambulatory Visit: Payer: Self-pay | Admitting: Gastroenterology

## 2014-03-28 DIAGNOSIS — R1310 Dysphagia, unspecified: Secondary | ICD-10-CM

## 2014-03-28 DIAGNOSIS — R130 Aphagia: Secondary | ICD-10-CM

## 2014-03-28 DIAGNOSIS — E049 Nontoxic goiter, unspecified: Secondary | ICD-10-CM

## 2014-03-28 DIAGNOSIS — E041 Nontoxic single thyroid nodule: Secondary | ICD-10-CM | POA: Diagnosis not present

## 2014-03-29 ENCOUNTER — Ambulatory Visit
Admission: RE | Admit: 2014-03-29 | Discharge: 2014-03-29 | Disposition: A | Discharge status: Home / Self Care | Payer: Medicare Other | Source: Ambulatory Visit | Attending: Internal Medicine | Admitting: Internal Medicine

## 2014-03-29 DIAGNOSIS — E041 Nontoxic single thyroid nodule: Secondary | ICD-10-CM

## 2014-03-29 DIAGNOSIS — R130 Aphagia: Secondary | ICD-10-CM

## 2014-03-29 DIAGNOSIS — E042 Nontoxic multinodular goiter: Secondary | ICD-10-CM | POA: Diagnosis not present

## 2014-03-29 DIAGNOSIS — E049 Nontoxic goiter, unspecified: Secondary | ICD-10-CM

## 2014-03-30 ENCOUNTER — Other Ambulatory Visit: Payer: Medicare Other

## 2014-03-30 DIAGNOSIS — N281 Cyst of kidney, acquired: Secondary | ICD-10-CM | POA: Diagnosis not present

## 2014-03-31 ENCOUNTER — Other Ambulatory Visit: Payer: Self-pay | Admitting: Internal Medicine

## 2014-03-31 DIAGNOSIS — E041 Nontoxic single thyroid nodule: Secondary | ICD-10-CM

## 2014-04-06 ENCOUNTER — Ambulatory Visit
Admission: RE | Admit: 2014-04-06 | Discharge: 2014-04-06 | Disposition: A | Discharge status: Home / Self Care | Payer: Medicare Other | Source: Ambulatory Visit | Attending: Internal Medicine | Admitting: Internal Medicine

## 2014-04-06 ENCOUNTER — Other Ambulatory Visit (HOSPITAL_COMMUNITY)
Admission: RE | Admit: 2014-04-06 | Discharge: 2014-04-06 | Disposition: A | Discharge status: Home / Self Care | Payer: Medicare Other | Source: Ambulatory Visit | Attending: Diagnostic Radiology | Admitting: Diagnostic Radiology

## 2014-04-06 DIAGNOSIS — E041 Nontoxic single thyroid nodule: Secondary | ICD-10-CM | POA: Diagnosis not present

## 2014-04-25 ENCOUNTER — Encounter (HOSPITAL_COMMUNITY): Payer: Self-pay | Admitting: Emergency Medicine

## 2014-06-10 DIAGNOSIS — N390 Urinary tract infection, site not specified: Secondary | ICD-10-CM | POA: Diagnosis not present

## 2014-06-13 DIAGNOSIS — N390 Urinary tract infection, site not specified: Secondary | ICD-10-CM | POA: Diagnosis not present

## 2014-06-13 DIAGNOSIS — R109 Unspecified abdominal pain: Secondary | ICD-10-CM | POA: Diagnosis not present

## 2014-06-13 DIAGNOSIS — F439 Reaction to severe stress, unspecified: Secondary | ICD-10-CM | POA: Diagnosis not present

## 2014-06-20 DIAGNOSIS — N1330 Unspecified hydronephrosis: Secondary | ICD-10-CM | POA: Diagnosis not present

## 2014-06-20 DIAGNOSIS — N281 Cyst of kidney, acquired: Secondary | ICD-10-CM | POA: Diagnosis not present

## 2014-06-20 DIAGNOSIS — N8111 Cystocele, midline: Secondary | ICD-10-CM | POA: Diagnosis not present

## 2014-06-20 DIAGNOSIS — R5381 Other malaise: Secondary | ICD-10-CM | POA: Diagnosis not present

## 2014-06-20 DIAGNOSIS — N200 Calculus of kidney: Secondary | ICD-10-CM | POA: Diagnosis not present

## 2014-06-27 DIAGNOSIS — N281 Cyst of kidney, acquired: Secondary | ICD-10-CM | POA: Diagnosis not present

## 2014-06-27 DIAGNOSIS — R1012 Left upper quadrant pain: Secondary | ICD-10-CM | POA: Diagnosis not present

## 2014-06-27 DIAGNOSIS — N390 Urinary tract infection, site not specified: Secondary | ICD-10-CM | POA: Diagnosis not present

## 2014-08-12 DIAGNOSIS — Z0000 Encounter for general adult medical examination without abnormal findings: Secondary | ICD-10-CM | POA: Diagnosis not present

## 2014-08-12 DIAGNOSIS — Z1389 Encounter for screening for other disorder: Secondary | ICD-10-CM | POA: Diagnosis not present

## 2014-08-12 DIAGNOSIS — I700 Atherosclerosis of aorta: Secondary | ICD-10-CM | POA: Diagnosis not present

## 2014-08-12 DIAGNOSIS — I129 Hypertensive chronic kidney disease with stage 1 through stage 4 chronic kidney disease, or unspecified chronic kidney disease: Secondary | ICD-10-CM | POA: Diagnosis not present

## 2014-08-12 DIAGNOSIS — N183 Chronic kidney disease, stage 3 (moderate): Secondary | ICD-10-CM | POA: Diagnosis not present

## 2014-08-12 DIAGNOSIS — I714 Abdominal aortic aneurysm, without rupture: Secondary | ICD-10-CM | POA: Diagnosis not present

## 2014-08-12 DIAGNOSIS — E785 Hyperlipidemia, unspecified: Secondary | ICD-10-CM | POA: Diagnosis not present

## 2014-08-12 DIAGNOSIS — K219 Gastro-esophageal reflux disease without esophagitis: Secondary | ICD-10-CM | POA: Diagnosis not present

## 2014-08-12 DIAGNOSIS — H9193 Unspecified hearing loss, bilateral: Secondary | ICD-10-CM | POA: Diagnosis not present

## 2014-08-12 DIAGNOSIS — E049 Nontoxic goiter, unspecified: Secondary | ICD-10-CM | POA: Diagnosis not present

## 2014-08-12 DIAGNOSIS — M2550 Pain in unspecified joint: Secondary | ICD-10-CM | POA: Diagnosis not present

## 2014-08-12 DIAGNOSIS — M810 Age-related osteoporosis without current pathological fracture: Secondary | ICD-10-CM | POA: Diagnosis not present

## 2014-09-01 DIAGNOSIS — M7541 Impingement syndrome of right shoulder: Secondary | ICD-10-CM | POA: Diagnosis not present

## 2014-09-01 DIAGNOSIS — M79641 Pain in right hand: Secondary | ICD-10-CM | POA: Diagnosis not present

## 2014-10-04 DIAGNOSIS — M79641 Pain in right hand: Secondary | ICD-10-CM | POA: Diagnosis not present

## 2014-10-05 DIAGNOSIS — M79641 Pain in right hand: Secondary | ICD-10-CM | POA: Diagnosis not present

## 2014-10-14 DIAGNOSIS — M79641 Pain in right hand: Secondary | ICD-10-CM | POA: Diagnosis not present

## 2014-11-01 DIAGNOSIS — M79641 Pain in right hand: Secondary | ICD-10-CM | POA: Diagnosis not present

## 2014-11-01 DIAGNOSIS — M67911 Unspecified disorder of synovium and tendon, right shoulder: Secondary | ICD-10-CM | POA: Diagnosis not present

## 2014-11-01 DIAGNOSIS — M67912 Unspecified disorder of synovium and tendon, left shoulder: Secondary | ICD-10-CM | POA: Diagnosis not present

## 2014-11-01 DIAGNOSIS — M79672 Pain in left foot: Secondary | ICD-10-CM | POA: Diagnosis not present

## 2014-11-28 DIAGNOSIS — R1310 Dysphagia, unspecified: Secondary | ICD-10-CM | POA: Diagnosis not present

## 2014-12-01 ENCOUNTER — Emergency Department (HOSPITAL_COMMUNITY): Payer: Medicare Other

## 2014-12-01 ENCOUNTER — Encounter (HOSPITAL_COMMUNITY): Payer: Self-pay | Admitting: Emergency Medicine

## 2014-12-01 ENCOUNTER — Emergency Department (HOSPITAL_COMMUNITY)
Admission: EM | Admit: 2014-12-01 | Discharge: 2014-12-02 | Disposition: A | Discharge status: Home / Self Care | Payer: Medicare Other | Attending: Emergency Medicine | Admitting: Emergency Medicine

## 2014-12-01 DIAGNOSIS — Z8701 Personal history of pneumonia (recurrent): Secondary | ICD-10-CM | POA: Insufficient documentation

## 2014-12-01 DIAGNOSIS — Z87440 Personal history of urinary (tract) infections: Secondary | ICD-10-CM | POA: Insufficient documentation

## 2014-12-01 DIAGNOSIS — R0602 Shortness of breath: Secondary | ICD-10-CM | POA: Diagnosis not present

## 2014-12-01 DIAGNOSIS — Z87442 Personal history of urinary calculi: Secondary | ICD-10-CM | POA: Diagnosis not present

## 2014-12-01 DIAGNOSIS — E010 Iodine-deficiency related diffuse (endemic) goiter: Secondary | ICD-10-CM | POA: Diagnosis not present

## 2014-12-01 DIAGNOSIS — I10 Essential (primary) hypertension: Secondary | ICD-10-CM | POA: Insufficient documentation

## 2014-12-01 DIAGNOSIS — E041 Nontoxic single thyroid nodule: Secondary | ICD-10-CM | POA: Insufficient documentation

## 2014-12-01 DIAGNOSIS — E079 Disorder of thyroid, unspecified: Secondary | ICD-10-CM

## 2014-12-01 DIAGNOSIS — Z79899 Other long term (current) drug therapy: Secondary | ICD-10-CM | POA: Diagnosis not present

## 2014-12-01 DIAGNOSIS — Z7982 Long term (current) use of aspirin: Secondary | ICD-10-CM | POA: Diagnosis not present

## 2014-12-01 DIAGNOSIS — Z8719 Personal history of other diseases of the digestive system: Secondary | ICD-10-CM | POA: Diagnosis not present

## 2014-12-01 DIAGNOSIS — E785 Hyperlipidemia, unspecified: Secondary | ICD-10-CM | POA: Insufficient documentation

## 2014-12-01 DIAGNOSIS — D72820 Lymphocytosis (symptomatic): Secondary | ICD-10-CM | POA: Diagnosis not present

## 2014-12-01 DIAGNOSIS — E049 Nontoxic goiter, unspecified: Secondary | ICD-10-CM | POA: Diagnosis not present

## 2014-12-01 DIAGNOSIS — R1310 Dysphagia, unspecified: Secondary | ICD-10-CM | POA: Diagnosis present

## 2014-12-01 LAB — COMPREHENSIVE METABOLIC PANEL
ALBUMIN: 3.4 g/dL — AB (ref 3.5–5.0)
ALK PHOS: 69 U/L (ref 38–126)
ALT: 15 U/L (ref 14–54)
AST: 27 U/L (ref 15–41)
Anion gap: 10 (ref 5–15)
BUN: 19 mg/dL (ref 6–20)
CHLORIDE: 99 mmol/L — AB (ref 101–111)
CO2: 22 mmol/L (ref 22–32)
Calcium: 9.5 mg/dL (ref 8.9–10.3)
Creatinine, Ser: 1.06 mg/dL — ABNORMAL HIGH (ref 0.44–1.00)
GFR calc non Af Amer: 47 mL/min — ABNORMAL LOW (ref >60)
GFR, EST AFRICAN AMERICAN: 54 mL/min — AB (ref >60)
Glucose, Bld: 126 mg/dL — ABNORMAL HIGH (ref 65–99)
Potassium: 4.1 mmol/L (ref 3.5–5.1)
SODIUM: 131 mmol/L — AB (ref 135–145)
TOTAL PROTEIN: 7 g/dL (ref 6.5–8.1)
Total Bilirubin: 0.3 mg/dL (ref 0.3–1.2)

## 2014-12-01 MED ORDER — IOHEXOL 300 MG/ML  SOLN
75.0000 mL | Freq: Once | INTRAMUSCULAR | Status: AC | PRN
  Administered 2014-12-01: 100 mL via INTRAVENOUS

## 2014-12-01 NOTE — ED Notes (Signed)
Pt. reported difficulty swallowing while eating a peach this evening , pt. stated it feels like it is stuck in her throat with SOB , also reported history thyroid enlargement . Airway intact / speech clear .

## 2014-12-02 ENCOUNTER — Telehealth (HOSPITAL_COMMUNITY): Payer: Self-pay | Admitting: *Deleted

## 2014-12-02 ENCOUNTER — Emergency Department (HOSPITAL_COMMUNITY)
Admission: RE | Admit: 2014-12-02 | Discharge: 2014-12-02 | Disposition: A | Discharge status: Home / Self Care | Payer: Medicare Other | Source: Ambulatory Visit | Attending: Internal Medicine | Admitting: Internal Medicine

## 2014-12-02 ENCOUNTER — Other Ambulatory Visit (HOSPITAL_COMMUNITY): Payer: Self-pay | Admitting: Internal Medicine

## 2014-12-02 DIAGNOSIS — E041 Nontoxic single thyroid nodule: Secondary | ICD-10-CM

## 2014-12-02 LAB — CBC WITH DIFFERENTIAL/PLATELET
Basophils Absolute: 0 10*3/uL (ref 0.0–0.1)
Basophils Relative: 0 % (ref 0–1)
EOS PCT: 4 % (ref 0–5)
Eosinophils Absolute: 0.5 10*3/uL (ref 0.0–0.7)
HCT: 33 % — ABNORMAL LOW (ref 36.0–46.0)
HEMOGLOBIN: 11.3 g/dL — AB (ref 12.0–15.0)
LYMPHS ABS: 7.1 10*3/uL — AB (ref 0.7–4.0)
Lymphocytes Relative: 55 % — ABNORMAL HIGH (ref 12–46)
MCH: 30.8 pg (ref 26.0–34.0)
MCHC: 34.2 g/dL (ref 30.0–36.0)
MCV: 89.9 fL (ref 78.0–100.0)
MONO ABS: 0.9 10*3/uL (ref 0.1–1.0)
Monocytes Relative: 7 % (ref 3–12)
NEUTROS ABS: 4.3 10*3/uL (ref 1.7–7.7)
Neutrophils Relative %: 34 % — ABNORMAL LOW (ref 43–77)
Platelets: 234 10*3/uL (ref 150–400)
RBC: 3.67 MIL/uL — AB (ref 3.87–5.11)
RDW: 12.5 % (ref 11.5–15.5)
WBC: 12.9 10*3/uL — AB (ref 4.0–10.5)

## 2014-12-02 MED ORDER — LIDOCAINE HCL (PF) 1 % IJ SOLN
INTRAMUSCULAR | Status: AC
  Filled 2014-12-02: qty 10

## 2014-12-02 NOTE — Procedures (Signed)
US guided aspiration of the right thyroid cyst.  25 ml of amber colored fluid was removed.  No immediate complication.  Minimal blood loss.

## 2014-12-02 NOTE — ED Notes (Signed)
Dr. Polina at bedside. 

## 2014-12-02 NOTE — ED Notes (Signed)
Pt stable, ambulatory, states understanding of discharge instructions 

## 2014-12-02 NOTE — ED Provider Notes (Signed)
CSN: [id number]     Arrival date & time 12/01/14  2033 History   First MD Initiated Contact with Patient 12/01/14 2154     Chief Complaint  Patient presents with  . Dysphagia     (Consider location/radiation/quality/duration/timing/severity/associated sxs/prior Treatment) HPI Comments: Patient presents to the ER for evaluation of swelling in her throat. Patient reports that she was eating a peach earlier this evening, swallowed and it felt like a peach got stuck. She started to feel some difficulty swallowing, but that has resolved. She still feels fullness on the right side of her neck. Patient reports that she has had a thyroid mass there previously. She had the mass aspirated 6 months ago. She saw her doctor today about the swelling in her neck and he is arranging for the aspiration to be performed again. She was concerned about the difficulty swallowing tonight, which caused her to come to the ER.   Past Medical History  Diagnosis Date  . Renal insufficiency   . Elevated brain natriuretic peptide (BNP) level     Slight  . Hiatal hernia   . Hypertension   . Hyperlipidemia   . Pneumonia     Required hospital stay at the time  . Nephrolithiasis   . Nocturia     x3  . Urinary tract infection   . Dyspnea on exertion     Improved with exercise  . Gastroenteritis 09/2010    c. dificile.  hospitalized in ICU x 3 days   Past Surgical History  Procedure Laterality Date  . Cataract extraction, bilateral  2010  . Wisdom tooth extraction    . Tonsillectomy     Family History  Problem Relation Age of Onset  . Heart attack Mother   . Hyperlipidemia Mother   . Asthma Father   . Diabetes Father    History  Substance Use Topics  . Smoking status: Never Smoker   . Smokeless tobacco: Never Used  . Alcohol Use: No   OB History    Gravida Para Term Preterm AB TAB SAB Ectopic Multiple Living   3 3        3      Review of Systems  HENT: Positive for trouble swallowing.   All other  systems reviewed and are negative.     Allergies  Prednisone and Reclast  Home Medications   Prior to Admission medications   Medication Sig Start Date End Date Taking? Authorizing Provider  amLODipine (NORVASC) 5 MG tablet Take 5 mg by mouth daily.   Yes Historical Provider, MD  aspirin 81 MG tablet Take 81 mg by mouth daily.   Yes Historical Provider, MD  atorvastatin (LIPITOR) 80 MG tablet Take 80 mg by mouth daily at 6 PM.    Yes Historical Provider, MD  B Complex-C (SUPER B COMPLEX PO) Take 1 tablet by mouth daily.    Yes Historical Provider, MD  cholecalciferol (VITAMIN D) 1000 UNITS tablet Take 2,000 Units by mouth 2 (two) times daily.    Yes Historical Provider, MD  magnesium chloride (SLOW-MAG) 64 MG TBEC SR tablet Take 1 tablet by mouth 2 (two) times daily.    Yes Historical Provider, MD  omeprazole (PRILOSEC) 20 MG capsule Take 20 mg by mouth daily.   Yes Historical Provider, MD  Probiotic Product ([place]) Take 1 capsule by mouth daily.   Yes Historical Provider, MD  valsartan-hydrochlorothiazide (DIOVAN-HCT) 160-12.5 MG per tablet Take 1 tablet by mouth daily.   Yes Historical  Provider, MD   BP 155/92 mmHg  Pulse 99  Temp(Src) 97.8 F (36.6 C) (Oral)  Resp 15  Ht 5\' 3"  (1.6 m)  Wt 132 lb (59.875 kg)  BMI 23.39 kg/m2  SpO2 92% Physical Exam  Constitutional: She is oriented to person, place, and time. She appears well-developed and well-nourished. No distress.  HENT:  Head: Normocephalic and atraumatic.  Right Ear: Hearing normal.  Left Ear: Hearing normal.  Nose: Nose normal.  Mouth/Throat: Oropharynx is clear and moist and mucous membranes are normal.  Eyes: Conjunctivae and EOM are normal. Pupils are equal, round, and reactive to light.  Neck: Normal range of motion. Neck supple. No tracheal deviation present. Thyromegaly (right-sided) present.  Cardiovascular: Regular rhythm, S1 normal and S2 normal.  Exam reveals no gallop and no friction rub.    No murmur heard. Pulmonary/Chest: Effort normal and breath sounds normal. No respiratory distress. She exhibits no tenderness.  Abdominal: Soft. Normal appearance and bowel sounds are normal. There is no hepatosplenomegaly. There is no tenderness. There is no rebound, no guarding, no tenderness at McBurney's point and negative Murphy's sign. No hernia.  Musculoskeletal: Normal range of motion.  Neurological: She is alert and oriented to person, place, and time. She has normal strength. No cranial nerve deficit or sensory deficit. Coordination normal. GCS eye subscore is 4. GCS verbal subscore is 5. GCS motor subscore is 6.  Skin: Skin is warm, dry and intact. No rash noted. No cyanosis.  Psychiatric: She has a normal mood and affect. Her speech is normal and behavior is normal. Thought content normal.  Nursing note and vitals reviewed.   ED Course  Procedures (including critical care time) Labs Review Labs Reviewed  CBC WITH DIFFERENTIAL/PLATELET - Abnormal; Notable for the following:    WBC 12.9 (*)    RBC 3.67 (*)    Hemoglobin 11.3 (*)    HCT 33.0 (*)    Neutrophils Relative % 34 (*)    Lymphocytes Relative 55 (*)    Lymphs Abs 7.1 (*)    All other components within normal limits  COMPREHENSIVE METABOLIC PANEL - Abnormal; Notable for the following:    Sodium 131 (*)    Chloride 99 (*)    Glucose, Bld 126 (*)    Creatinine, Ser 1.06 (*)    Albumin 3.4 (*)    GFR calc non Af Amer 47 (*)    GFR calc Af Amer 54 (*)    All other components within normal limits    Imaging Review Dg Chest 2 View  12/01/2014   CLINICAL DATA:  Shortness of breath.  Dysphagia.  EXAM: CHEST  2 VIEW  COMPARISON:  03/26/2014  FINDINGS: Heart size is normal. Both lungs are clear. No evidence pleural effusion. Small to moderate hiatal hernia again seen.  IMPRESSION: No active cardiopulmonary disease.  Stable small to moderate hiatal hernia.   Electronically Signed   By: Earle Gell M.D.   On: 12/01/2014  21:41   Ct Soft Tissue Neck W Contrast  12/02/2014   CLINICAL DATA:  Dysphagia for 3 days, piece of peach got stuck today.  EXAM: CT NECK WITH CONTRAST  TECHNIQUE: Multidetector CT imaging of the neck was performed using the standard protocol following the bolus administration of intravenous contrast.  CONTRAST:  141mL OMNIPAQUE IOHEXOL 300 MG/ML  SOLN  COMPARISON:  None.  FINDINGS: Pharynx and larynx: Negative.  Salivary glands: Negative.  Thyroid: RIGHT thyromegaly with dominant 6 x 3.6 x 7.2 cm (AP by  transverse by CC) cystic and solid nodule. A thyroid displaces the trachea and esophagus to the LEFT , airway remains patent.  Lymph nodes: Subcentimeter pretracheal lymph nodes. No lymphadenopathy by CT size criteria.  Vascular: Calcific atherosclerosis of the carotid bulbs without large vessel occlusion.  Limited intracranial: Normal.  Visualized orbits: Bilateral ocular lens implants, otherwise unremarkable.  Mastoids and visualized paranasal sinuses: Mild paranasal sinus mucosal thickening, small RIGHT maxillary mucosal retention cyst. No paranasal sinus air-fluid levels. The mastoid air cells are well aerated.  Skeleton: Moderate C4-5, moderate C6-7 degenerative discs without destructive bony lesions. Prominent venous structures posterior neck.  Upper chest: Lung apices are clear.  IMPRESSION: RIGHT thyromegaly with dominant cystic and solid 6 x 3.6 x 7.2 cm nodule which displaces the trachea and esophagus to the LEFT, airway remains patent. Recommend further characterization with thyroid sonogram on a nonemergent basis.   Electronically Signed   By: Elon Alas M.D.   On: 12/02/2014 00:18     EKG Interpretation   Date/Time:  Thursday December 01 2014 20:58:53 EDT Ventricular Rate:  80 PR Interval:  174 QRS Duration: 76 QT Interval:  360 QTC Calculation: 415 R Axis:   57 Text Interpretation:  Normal sinus rhythm Normal ECG Confirmed by POLLINA   MD, CHRISTOPHER [phone]) on 12/01/2014 9:58:23  PM      MDM   Final diagnoses:  Thyroid mass    Presents to the emergency department for evaluation of difficulty swallowing. Patient reports a previous history of thyroid mass that was cystic in nature and was drained by needle aspiration. This was 6 months ago. She appears to have had progressive reaccumulation of the fluid. Tonight she had trouble swallowing and felt short of breath, but the symptoms have resolved. She is comfortable. She is breathing without difficulty. CT scan shows reaccumulation of cystic lesion, but airway is patent. Patient is appropriate for discharge, was told that she should stay on liquid diet or thickened liquids, no solid foods and call her doctor tomorrow to see if the aspiration procedure can be performed tomorrow.  He should and family were told that if she starts to have any worsening symptoms she should call 911 in the proper back directly to the ER.   Orpah Greek, MD 12/02/14 0030

## 2014-12-02 NOTE — Discharge Instructions (Signed)
Thyroid Biopsy °The thyroid gland is a butterfly-shaped gland situated in the front of the neck. It produces hormones which affect metabolism, growth and development, and body temperature. A thyroid biopsy is a procedure in which small samples of tissue or fluid are removed from the thyroid gland or mass and examined under a microscope. This test is done to determine the cause of thyroid problems, such as infection, cancer, or other thyroid problems. °There are 2 ways to obtain samples: °1. Fine needle biopsy. Samples are removed using a thin needle inserted through the skin and into the thyroid gland or mass. °2. Open biopsy. Samples are removed after a cut (incision) is made through the skin. °LET YOUR CAREGIVER KNOW ABOUT:  °· Allergies. °· Medications taken including herbs, eye drops, over-the-counter medications, and creams. °· Use of steroids (by mouth or creams). °· Previous problems with anesthetics or numbing medicine. °· Possibility of pregnancy, if this applies. °· History of blood clots (thrombophlebitis). °· History of bleeding or blood problems. °· Previous surgery. °· Other health problems. °RISKS AND COMPLICATIONS °· Bleeding from the site. The risk of bleeding is higher if you have a bleeding disorder or are taking any blood thinning medications (anticoagulants). °· Infection. °· Injury to structures near the thyroid gland. °BEFORE THE PROCEDURE  °This is a procedure that can be done as an outpatient. Confirm the time that you need to arrive for your procedure. Confirm whether there is a need to fast or withhold any medications. A blood sample may be done to determine your blood clotting time. Medicine may be given to help you relax (sedative). °PROCEDURE °Fine needle biopsy. °You will be awake during the procedure. You may be asked to lie on your back with your head tipped backward to extend your neck. Let your caregiver know if you cannot tolerate the positioning. An area on your neck will be  cleansed. A needle is inserted through the skin of your neck. You may feel a mild discomfort during this procedure. You may be asked to avoid coughing, talking, swallowing, or making sounds during some portions of the procedure. The needle is withdrawn once tissue or fluid samples have been removed. Pressure may be applied to the neck to reduce swelling and ensure that bleeding has stopped. The samples will be sent for examination.  °Open biopsy. °You will be given general anesthesia. You will be asleep during the procedure. An incision is made in your neck. A sample of thyroid tissue or the mass is removed. The tissue sample or mass will be sent for examination. The sample or mass may be examined during the biopsy. If the sample or mass contains cancer cells, some or all of the thyroid gland may be removed. The incision is closed with stitches. °AFTER THE PROCEDURE  °Your recovery will be assessed and monitored. If there are no problems, as an outpatient, you should be able to go home shortly after the procedure. °If you had a fine needle biopsy: °· You may have soreness at the biopsy site for 1 to 2 days. °If you had an open biopsy:  °· You may have soreness at the biopsy site for 3 to 4 days. °· You may have a hoarse voice or sore throat for 1 to 2 days. °Obtaining the Test Results °It is your responsibility to obtain your test results. Do not assume everything is normal if you have not heard from your caregiver or the medical facility. It is important for you to follow up   on all of your test results. °HOME CARE INSTRUCTIONS  °· Keeping your head raised on a pillow when you are lying down may ease biopsy site discomfort. °· Supporting the back of your head and neck with both hands as you sit up from a lying position may ease biopsy site discomfort. °· Only take over-the-counter or prescription medicines for pain, discomfort, or fever as directed by your caregiver. °· Throat lozenges or gargling with warm salt  water may help to soothe a sore throat. °SEEK IMMEDIATE MEDICAL CARE IF:  °· You have severe bleeding from the biopsy site. °· You have difficulty swallowing. °· You have a fever. °· You have increased pain, swelling, redness, or warmth at the biopsy site. °· You notice pus coming from the biopsy site. °· You have swollen glands (lymph nodes) in your neck. °Document Released: 04/07/2007 Document Revised: 10/05/2012 Document Reviewed: 09/02/2013 °ExitCare® Patient Information ©2015 [employer]. This information is not intended to replace advice given to you by your health care provider. Make sure you discuss any questions you have with your health care provider. ° °

## 2014-12-04 LAB — PATHOLOGIST SMEAR REVIEW: PATH REVIEW: REACTIVE

## 2014-12-05 ENCOUNTER — Ambulatory Visit: Payer: Self-pay | Admitting: Surgery

## 2014-12-05 ENCOUNTER — Other Ambulatory Visit: Payer: Self-pay | Admitting: Internal Medicine

## 2014-12-05 DIAGNOSIS — E041 Nontoxic single thyroid nodule: Secondary | ICD-10-CM | POA: Diagnosis not present

## 2014-12-07 ENCOUNTER — Other Ambulatory Visit: Payer: Self-pay | Admitting: Physician Assistant

## 2014-12-07 DIAGNOSIS — K449 Diaphragmatic hernia without obstruction or gangrene: Secondary | ICD-10-CM | POA: Diagnosis not present

## 2014-12-07 DIAGNOSIS — R1310 Dysphagia, unspecified: Secondary | ICD-10-CM

## 2014-12-07 DIAGNOSIS — K219 Gastro-esophageal reflux disease without esophagitis: Secondary | ICD-10-CM | POA: Diagnosis not present

## 2014-12-07 DIAGNOSIS — E041 Nontoxic single thyroid nodule: Secondary | ICD-10-CM | POA: Diagnosis not present

## 2014-12-07 DIAGNOSIS — Z8719 Personal history of other diseases of the digestive system: Secondary | ICD-10-CM | POA: Diagnosis not present

## 2014-12-09 ENCOUNTER — Ambulatory Visit
Admission: RE | Admit: 2014-12-09 | Discharge: 2014-12-09 | Disposition: A | Discharge status: Home / Self Care | Payer: Medicare Other | Source: Ambulatory Visit | Attending: Physician Assistant | Admitting: Physician Assistant

## 2014-12-09 DIAGNOSIS — R1310 Dysphagia, unspecified: Secondary | ICD-10-CM | POA: Diagnosis not present

## 2014-12-09 DIAGNOSIS — K449 Diaphragmatic hernia without obstruction or gangrene: Secondary | ICD-10-CM | POA: Diagnosis not present

## 2015-01-02 ENCOUNTER — Encounter (HOSPITAL_COMMUNITY): Payer: Self-pay

## 2015-01-02 ENCOUNTER — Encounter (HOSPITAL_COMMUNITY)
Admission: RE | Admit: 2015-01-02 | Discharge: 2015-01-02 | Disposition: A | Discharge status: Home / Self Care | Payer: Medicare Other | Source: Ambulatory Visit | Attending: Surgery | Admitting: Surgery

## 2015-01-02 DIAGNOSIS — E041 Nontoxic single thyroid nodule: Secondary | ICD-10-CM | POA: Diagnosis present

## 2015-01-02 DIAGNOSIS — K449 Diaphragmatic hernia without obstruction or gangrene: Secondary | ICD-10-CM | POA: Diagnosis not present

## 2015-01-02 DIAGNOSIS — E042 Nontoxic multinodular goiter: Secondary | ICD-10-CM | POA: Diagnosis not present

## 2015-01-02 DIAGNOSIS — Z79899 Other long term (current) drug therapy: Secondary | ICD-10-CM | POA: Diagnosis not present

## 2015-01-02 DIAGNOSIS — K219 Gastro-esophageal reflux disease without esophagitis: Secondary | ICD-10-CM | POA: Diagnosis not present

## 2015-01-02 DIAGNOSIS — M1990 Unspecified osteoarthritis, unspecified site: Secondary | ICD-10-CM | POA: Diagnosis not present

## 2015-01-02 DIAGNOSIS — E063 Autoimmune thyroiditis: Secondary | ICD-10-CM | POA: Diagnosis not present

## 2015-01-02 DIAGNOSIS — R1310 Dysphagia, unspecified: Secondary | ICD-10-CM | POA: Diagnosis not present

## 2015-01-02 DIAGNOSIS — Z7982 Long term (current) use of aspirin: Secondary | ICD-10-CM | POA: Diagnosis not present

## 2015-01-02 DIAGNOSIS — I10 Essential (primary) hypertension: Secondary | ICD-10-CM | POA: Diagnosis not present

## 2015-01-02 HISTORY — DX: Presence of spectacles and contact lenses: Z97.3

## 2015-01-02 HISTORY — DX: Unspecified osteoarthritis, unspecified site: M19.90

## 2015-01-02 HISTORY — DX: Reserved for inherently not codable concepts without codable children: IMO0001

## 2015-01-02 HISTORY — DX: Other seasonal allergic rhinitis: J30.2

## 2015-01-02 HISTORY — DX: Gastro-esophageal reflux disease without esophagitis: K21.9

## 2015-01-02 LAB — BASIC METABOLIC PANEL
ANION GAP: 7 (ref 5–15)
BUN: 20 mg/dL (ref 6–20)
CALCIUM: 9.8 mg/dL (ref 8.9–10.3)
CHLORIDE: 106 mmol/L (ref 101–111)
CO2: 26 mmol/L (ref 22–32)
CREATININE: 1 mg/dL (ref 0.44–1.00)
GFR, EST AFRICAN AMERICAN: 58 mL/min — AB (ref >60)
GFR, EST NON AFRICAN AMERICAN: 50 mL/min — AB (ref >60)
Glucose, Bld: 106 mg/dL — ABNORMAL HIGH (ref 65–99)
Potassium: 4.6 mmol/L (ref 3.5–5.1)
SODIUM: 139 mmol/L (ref 135–145)

## 2015-01-02 LAB — CBC
HCT: 33.5 % — ABNORMAL LOW (ref 36.0–46.0)
Hemoglobin: 11.2 g/dL — ABNORMAL LOW (ref 12.0–15.0)
MCH: 30.4 pg (ref 26.0–34.0)
MCHC: 33.4 g/dL (ref 30.0–36.0)
MCV: 90.8 fL (ref 78.0–100.0)
Platelets: 252 10*3/uL (ref 150–400)
RBC: 3.69 MIL/uL — AB (ref 3.87–5.11)
RDW: 12.7 % (ref 11.5–15.5)
WBC: 13.1 10*3/uL — AB (ref 4.0–10.5)

## 2015-01-02 MED ORDER — CEFAZOLIN SODIUM-DEXTROSE 2-3 GM-% IV SOLR
2.0000 g | INTRAVENOUS | Status: AC
  Administered 2015-01-03: 2 g via INTRAVENOUS
  Filled 2015-01-02: qty 50

## 2015-01-02 NOTE — Pre-Procedure Instructions (Signed)
    Donna Burch  01/02/2015      [facility] - [place], [address] [place] [address] Phone: [phone] Fax: [phone]  [id number] - [place], [place] - [address] [place] [address] Phone: [phone] Fax: [phone]    Your procedure is scheduled on Tuesday, July 12th, 2016 at 9:00 AM  Report to [facility] at 7:00 A.M.  Call this number if you have problems the morning of surgery:  [phone]   Remember:  Do not eat food or drink liquids after midnight.  Take these medicines the morning of surgery with A SIP OF WATER: Amlodipine (Norvasc), Omeprazole (Prilosec).   Stop taking: Aspirin, NSAIDs, Ibuprofen, BC's, Goody's, Naproxen, Aleve, Fish Oil, all herbal medications, and all vitamins.    Do not wear jewelry, make-up or nail polish.  Do not wear lotions, powders, or perfumes.  You may wear deodorant.  Do not shave 48 hours prior to surgery.    Do not bring valuables to the hospital.  [facility] is not responsible for any belongings or valuables.  Contacts, dentures or bridgework may not be worn into surgery.  Leave your suitcase in the car.  After surgery it may be brought to your room.  For patients admitted to the hospital, discharge time will be determined by your treatment team.  Patients discharged the day of surgery will not be allowed to drive home.   Special instructions:  See attached.   Please read over the following fact sheets that you were given. Pain Booklet, Coughing and Deep Breathing and Surgical Site Infection Prevention

## 2015-01-02 NOTE — Progress Notes (Signed)
PCP- Dr. Harlan Stains  Cardiologist - pt. States she no longer has a cardiologist but used to Dr. Doreatha Lew  EKG- 12/01/14 - Epic CXR- 12/01/14 - Epic  Echo- 09/19/09 - Epic Stress Test - 09/19/09 -Epic Cardiac Cath - denies

## 2015-01-03 ENCOUNTER — Encounter (HOSPITAL_COMMUNITY): Payer: Self-pay | Admitting: Surgery

## 2015-01-03 ENCOUNTER — Observation Stay (HOSPITAL_COMMUNITY)
Admission: RE | Admit: 2015-01-03 | Discharge: 2015-01-04 | Disposition: A | Discharge status: Home / Self Care | Payer: Medicare Other | Source: Ambulatory Visit | Attending: Surgery | Admitting: Surgery

## 2015-01-03 ENCOUNTER — Ambulatory Visit (HOSPITAL_COMMUNITY): Payer: Medicare Other | Admitting: Anesthesiology

## 2015-01-03 ENCOUNTER — Encounter (HOSPITAL_COMMUNITY)
Admission: RE | Disposition: A | Discharge status: Home / Self Care | Payer: Self-pay | Source: Ambulatory Visit | Attending: Surgery

## 2015-01-03 ENCOUNTER — Ambulatory Visit (HOSPITAL_COMMUNITY): Payer: Medicare Other | Admitting: Vascular Surgery

## 2015-01-03 DIAGNOSIS — E041 Nontoxic single thyroid nodule: Secondary | ICD-10-CM | POA: Diagnosis not present

## 2015-01-03 DIAGNOSIS — E063 Autoimmune thyroiditis: Secondary | ICD-10-CM | POA: Insufficient documentation

## 2015-01-03 DIAGNOSIS — I10 Essential (primary) hypertension: Secondary | ICD-10-CM | POA: Diagnosis not present

## 2015-01-03 DIAGNOSIS — E042 Nontoxic multinodular goiter: Secondary | ICD-10-CM | POA: Diagnosis not present

## 2015-01-03 DIAGNOSIS — K219 Gastro-esophageal reflux disease without esophagitis: Secondary | ICD-10-CM | POA: Insufficient documentation

## 2015-01-03 DIAGNOSIS — K449 Diaphragmatic hernia without obstruction or gangrene: Secondary | ICD-10-CM | POA: Insufficient documentation

## 2015-01-03 DIAGNOSIS — R1310 Dysphagia, unspecified: Secondary | ICD-10-CM | POA: Diagnosis not present

## 2015-01-03 DIAGNOSIS — Z7982 Long term (current) use of aspirin: Secondary | ICD-10-CM | POA: Insufficient documentation

## 2015-01-03 DIAGNOSIS — M1990 Unspecified osteoarthritis, unspecified site: Secondary | ICD-10-CM | POA: Insufficient documentation

## 2015-01-03 DIAGNOSIS — Z79899 Other long term (current) drug therapy: Secondary | ICD-10-CM | POA: Insufficient documentation

## 2015-01-03 HISTORY — DX: Nausea with vomiting, unspecified: Z98.890

## 2015-01-03 HISTORY — DX: Hypothyroidism, unspecified: E03.9

## 2015-01-03 HISTORY — DX: Family history of other specified conditions: Z84.89

## 2015-01-03 HISTORY — DX: Unspecified malignant neoplasm of skin, unspecified: C44.90

## 2015-01-03 HISTORY — PX: THYROID LOBECTOMY: SHX420

## 2015-01-03 HISTORY — DX: Nausea with vomiting, unspecified: R11.2

## 2015-01-03 HISTORY — DX: Urinary tract infection, site not specified: N39.0

## 2015-01-03 SURGERY — LOBECTOMY, THYROID
Anesthesia: General | Site: Neck | Laterality: Right

## 2015-01-03 MED ORDER — GLYCOPYRROLATE 0.2 MG/ML IJ SOLN
INTRAMUSCULAR | Status: DC | PRN
  Administered 2015-01-03: 0.4 mg via INTRAVENOUS

## 2015-01-03 MED ORDER — EPHEDRINE SULFATE 50 MG/ML IJ SOLN
INTRAMUSCULAR | Status: DC | PRN
  Administered 2015-01-03: 5 mg via INTRAVENOUS
  Administered 2015-01-03: 10 mg via INTRAVENOUS

## 2015-01-03 MED ORDER — ONDANSETRON HCL 4 MG PO TABS: 4.0000 mg | ORAL_TABLET | Freq: Four times a day (QID) | ORAL | Status: DC | PRN

## 2015-01-03 MED ORDER — OXYCODONE HCL 5 MG PO TABS: 5.0000 mg | ORAL_TABLET | Freq: Once | ORAL | Status: DC | PRN

## 2015-01-03 MED ORDER — ONDANSETRON HCL 4 MG/2ML IJ SOLN: 4.0000 mg | Freq: Four times a day (QID) | INTRAMUSCULAR | Status: DC | PRN

## 2015-01-03 MED ORDER — OXYCODONE HCL 5 MG/5ML PO SOLN: 5.0000 mg | Freq: Once | ORAL | Status: DC | PRN

## 2015-01-03 MED ORDER — HYDROCHLOROTHIAZIDE 12.5 MG PO CAPS
12.5000 mg | ORAL_CAPSULE | Freq: Every day | ORAL | Status: DC
  Administered 2015-01-03: 12.5 mg via ORAL
  Filled 2015-01-03: qty 1

## 2015-01-03 MED ORDER — MAGNESIUM CHLORIDE 64 MG PO TBEC
1.0000 | DELAYED_RELEASE_TABLET | Freq: Two times a day (BID) | ORAL | Status: DC
  Administered 2015-01-03: 64 mg via ORAL
  Filled 2015-01-03 (×4): qty 1

## 2015-01-03 MED ORDER — PROPOFOL 10 MG/ML IV BOLUS
INTRAVENOUS | Status: DC | PRN
  Administered 2015-01-03: 90 mg via INTRAVENOUS

## 2015-01-03 MED ORDER — MIDAZOLAM HCL 2 MG/2ML IJ SOLN
INTRAMUSCULAR | Status: AC
  Filled 2015-01-03: qty 2

## 2015-01-03 MED ORDER — KCL IN DEXTROSE-NACL 20-5-0.45 MEQ/L-%-% IV SOLN
INTRAVENOUS | Status: DC
  Administered 2015-01-03: 14:00:00 via INTRAVENOUS
  Filled 2015-01-03: qty 1000

## 2015-01-03 MED ORDER — FENTANYL CITRATE (PF) 100 MCG/2ML IJ SOLN
INTRAMUSCULAR | Status: DC | PRN
  Administered 2015-01-03: 50 ug via INTRAVENOUS
  Administered 2015-01-03: 100 ug via INTRAVENOUS

## 2015-01-03 MED ORDER — HYDROMORPHONE HCL 1 MG/ML IJ SOLN: 1.0000 mg | INTRAMUSCULAR | Status: DC | PRN

## 2015-01-03 MED ORDER — IRBESARTAN 150 MG PO TABS
150.0000 mg | ORAL_TABLET | Freq: Every day | ORAL | Status: DC
  Administered 2015-01-03: 150 mg via ORAL
  Filled 2015-01-03: qty 1

## 2015-01-03 MED ORDER — DEXAMETHASONE SODIUM PHOSPHATE 4 MG/ML IJ SOLN
INTRAMUSCULAR | Status: AC
  Filled 2015-01-03: qty 2

## 2015-01-03 MED ORDER — LIDOCAINE HCL (CARDIAC) 20 MG/ML IV SOLN
INTRAVENOUS | Status: DC | PRN
  Administered 2015-01-03: 20 mg via INTRAVENOUS

## 2015-01-03 MED ORDER — ONDANSETRON HCL 4 MG/2ML IJ SOLN
INTRAMUSCULAR | Status: AC
  Filled 2015-01-03: qty 2

## 2015-01-03 MED ORDER — FENTANYL CITRATE (PF) 100 MCG/2ML IJ SOLN
25.0000 ug | INTRAMUSCULAR | Status: DC | PRN
  Administered 2015-01-03 (×3): 25 ug via INTRAVENOUS

## 2015-01-03 MED ORDER — HEMOSTATIC AGENTS (NO CHARGE) OPTIME
TOPICAL | Status: DC | PRN
  Administered 2015-01-03: 1 via TOPICAL

## 2015-01-03 MED ORDER — PANTOPRAZOLE SODIUM 40 MG PO TBEC
40.0000 mg | DELAYED_RELEASE_TABLET | Freq: Every day | ORAL | Status: DC
  Administered 2015-01-03: 40 mg via ORAL
  Filled 2015-01-03: qty 1

## 2015-01-03 MED ORDER — ROCURONIUM BROMIDE 100 MG/10ML IV SOLN
INTRAVENOUS | Status: DC | PRN
  Administered 2015-01-03: 40 mg via INTRAVENOUS

## 2015-01-03 MED ORDER — FENTANYL CITRATE (PF) 100 MCG/2ML IJ SOLN
INTRAMUSCULAR | Status: AC
  Filled 2015-01-03: qty 2

## 2015-01-03 MED ORDER — PROPOFOL 10 MG/ML IV BOLUS
INTRAVENOUS | Status: AC
  Filled 2015-01-03: qty 20

## 2015-01-03 MED ORDER — EPHEDRINE SULFATE 50 MG/ML IJ SOLN
INTRAMUSCULAR | Status: AC
  Filled 2015-01-03: qty 1

## 2015-01-03 MED ORDER — ONDANSETRON HCL 4 MG/2ML IJ SOLN
INTRAMUSCULAR | Status: DC | PRN
  Administered 2015-01-03: 4 mg via INTRAVENOUS

## 2015-01-03 MED ORDER — ROCURONIUM BROMIDE 50 MG/5ML IV SOLN
INTRAVENOUS | Status: AC
  Filled 2015-01-03: qty 1

## 2015-01-03 MED ORDER — FENTANYL CITRATE (PF) 250 MCG/5ML IJ SOLN
INTRAMUSCULAR | Status: AC
  Filled 2015-01-03: qty 5

## 2015-01-03 MED ORDER — HYDROCODONE-ACETAMINOPHEN 5-325 MG PO TABS: 1.0000 | ORAL_TABLET | ORAL | Status: DC | PRN

## 2015-01-03 MED ORDER — 0.9 % SODIUM CHLORIDE (POUR BTL) OPTIME
TOPICAL | Status: DC | PRN
  Administered 2015-01-03: 1000 mL

## 2015-01-03 MED ORDER — ONDANSETRON HCL 4 MG/2ML IJ SOLN
4.0000 mg | Freq: Once | INTRAMUSCULAR | Status: AC | PRN
  Administered 2015-01-03: 4 mg via INTRAVENOUS

## 2015-01-03 MED ORDER — LIDOCAINE HCL (CARDIAC) 20 MG/ML IV SOLN
INTRAVENOUS | Status: AC
  Filled 2015-01-03: qty 5

## 2015-01-03 MED ORDER — STERILE WATER FOR INJECTION IJ SOLN
INTRAMUSCULAR | Status: AC
  Filled 2015-01-03: qty 10

## 2015-01-03 MED ORDER — LACTATED RINGERS IV SOLN
INTRAVENOUS | Status: DC
  Administered 2015-01-03: 07:00:00 via INTRAVENOUS

## 2015-01-03 MED ORDER — ACETAMINOPHEN 325 MG PO TABS: 650.0000 mg | ORAL_TABLET | ORAL | Status: DC | PRN

## 2015-01-03 MED ORDER — AMLODIPINE BESYLATE 5 MG PO TABS: 5.0000 mg | ORAL_TABLET | Freq: Every day | ORAL | Status: DC

## 2015-01-03 MED ORDER — VALSARTAN-HYDROCHLOROTHIAZIDE 160-12.5 MG PO TABS: 1.0000 | ORAL_TABLET | Freq: Every day | ORAL | Status: DC

## 2015-01-03 MED ORDER — NEOSTIGMINE METHYLSULFATE 10 MG/10ML IV SOLN
INTRAVENOUS | Status: DC | PRN
  Administered 2015-01-03: 3 mg via INTRAVENOUS

## 2015-01-03 MED ORDER — PHENYLEPHRINE HCL 10 MG/ML IJ SOLN
INTRAMUSCULAR | Status: DC | PRN
  Administered 2015-01-03 (×4): 80 ug via INTRAVENOUS

## 2015-01-03 SURGICAL SUPPLY — 48 items
ATTRACTOMAT 16X20 MAGNETIC DRP (DRAPES) ×3 IMPLANT
BENZOIN TINCTURE PRP APPL 2/3 (GAUZE/BANDAGES/DRESSINGS) ×3 IMPLANT
BLADE SURG 10 STRL SS (BLADE) ×3 IMPLANT
BLADE SURG 15 STRL LF DISP TIS (BLADE) ×1 IMPLANT
BLADE SURG 15 STRL SS (BLADE) ×2
CANISTER SUCTION 2500CC (MISCELLANEOUS) ×3 IMPLANT
CHLORAPREP W/TINT 10.5 ML (MISCELLANEOUS) ×3 IMPLANT
CLIP TI MEDIUM 24 (CLIP) ×3 IMPLANT
CLIP TI WIDE RED SMALL 24 (CLIP) ×3 IMPLANT
CLOSURE WOUND 1/2 X4 (GAUZE/BANDAGES/DRESSINGS) ×1
CONT SPEC 4OZ CLIKSEAL STRL BL (MISCELLANEOUS) ×3 IMPLANT
COVER SURGICAL LIGHT HANDLE (MISCELLANEOUS) ×3 IMPLANT
CRADLE DONUT ADULT HEAD (MISCELLANEOUS) ×3 IMPLANT
DRAPE PED LAPAROTOMY (DRAPES) ×3 IMPLANT
DRAPE UTILITY XL STRL (DRAPES) ×3 IMPLANT
ELECT CAUTERY BLADE 6.4 (BLADE) ×3 IMPLANT
ELECT REM PT RETURN 9FT ADLT (ELECTROSURGICAL) ×3
ELECTRODE REM PT RTRN 9FT ADLT (ELECTROSURGICAL) ×1 IMPLANT
GAUZE SPONGE 4X4 12PLY STRL (GAUZE/BANDAGES/DRESSINGS) ×3 IMPLANT
GAUZE SPONGE 4X4 16PLY XRAY LF (GAUZE/BANDAGES/DRESSINGS) ×3 IMPLANT
GLOVE BIO SURGEON STRL SZ7.5 (GLOVE) ×3 IMPLANT
GLOVE BIOGEL PI IND STRL 7.5 (GLOVE) ×2 IMPLANT
GLOVE BIOGEL PI INDICATOR 7.5 (GLOVE) ×4
GLOVE ECLIPSE 7.5 STRL STRAW (GLOVE) ×6 IMPLANT
GLOVE SURG ORTHO 8.0 STRL STRW (GLOVE) ×3 IMPLANT
GOWN STRL REUS W/ TWL LRG LVL3 (GOWN DISPOSABLE) ×1 IMPLANT
GOWN STRL REUS W/ TWL XL LVL3 (GOWN DISPOSABLE) ×1 IMPLANT
GOWN STRL REUS W/TWL LRG LVL3 (GOWN DISPOSABLE) ×2
GOWN STRL REUS W/TWL XL LVL3 (GOWN DISPOSABLE) ×2
HEMOSTAT SURGICEL 2X4 FIBR (HEMOSTASIS) ×3 IMPLANT
KIT BASIN OR (CUSTOM PROCEDURE TRAY) ×3 IMPLANT
KIT ROOM TURNOVER OR (KITS) ×3 IMPLANT
NS IRRIG 1000ML POUR BTL (IV SOLUTION) ×3 IMPLANT
PACK SURGICAL SETUP 50X90 (CUSTOM PROCEDURE TRAY) ×3 IMPLANT
PAD ARMBOARD 7.5X6 YLW CONV (MISCELLANEOUS) ×3 IMPLANT
PENCIL BUTTON HOLSTER BLD 10FT (ELECTRODE) ×3 IMPLANT
SHEARS HARMONIC 9CM CVD (BLADE) ×3 IMPLANT
SPONGE INTESTINAL PEANUT (DISPOSABLE) ×3 IMPLANT
STRIP CLOSURE SKIN 1/2X4 (GAUZE/BANDAGES/DRESSINGS) ×2 IMPLANT
SUT MNCRL AB 4-0 PS2 18 (SUTURE) ×3 IMPLANT
SUT SILK 2 0 (SUTURE) ×3
SUT SILK 2-0 18XBRD TIE 12 (SUTURE) ×1 IMPLANT
SUT VIC AB 3-0 SH 18 (SUTURE) ×6 IMPLANT
SYR BULB 3OZ (MISCELLANEOUS) ×3 IMPLANT
TOWEL OR 17X24 6PK STRL BLUE (TOWEL DISPOSABLE) ×3 IMPLANT
TOWEL OR 17X26 10 PK STRL BLUE (TOWEL DISPOSABLE) ×3 IMPLANT
TUBE CONNECTING 12'X1/4 (SUCTIONS) ×1
TUBE CONNECTING 12X1/4 (SUCTIONS) ×2 IMPLANT

## 2015-01-03 NOTE — Op Note (Signed)
Procedure Note  Pre-operative Diagnosis:  Right thyroid nodule  Post-operative Diagnosis:  same  Surgeon:  Earnstine Regal, MD, FACS  Assistant:  none   Procedure:  Right thyroid lobectomy  Anesthesia:  General  Estimated Blood Loss:  minimal  Drains: none         Specimen: thyroid lobe to pathology  Indications:  The patient is a 81 year old female who presents with a thyroid nodule. Patient is referred by Dr. Delrae Rend for evaluation of right thyroid mass. Patient has had a long-standing complex mass of the right thyroid lobe dating back to 2002. She has had 2 previous aspirations of the cystic portion of this mass. This improves her complaints of dysphagia. Recent ultrasound examination showed the right thyroid lobe to measure 6.9 x 4.2 x 4.7 cm. It was complex with partially cystic and partially solid components. Left thyroid lobe was normal in size with no identified nodules. There was no lymphadenopathy. Thyroid function is normal with a TSH level of 2.13. Patient states that she has never been on thyroid medication. She has had no prior head and neck surgery other than a small skin cancer removed from the right neck. There is a family history of thyroid surgery and the patient's sister. There is no family history of other endocrine neoplasms. Patient complains of significant dysphagia. She has a moderate globus sensation. She does have issues with esophageal reflux and hiatal hernia and is followed by Dr. Teena Irani from gastroenterology. Patient presents today for right thyroid lobectomy for relief of her compressive symptoms.  Procedure Details: Procedure was done in OR #9 at the [facility].  The patient was brought to the operating room and placed in a supine position on the operating room table.  Following administration of general anesthesia, the patient was positioned and then prepped and draped in the usual aseptic fashion.  After ascertaining that an  adequate level of anesthesia had been achieved, a Kocher incision was made with #15 blade.  Dissection was carried through subcutaneous tissues and platysma. Hemostasis was achieved with the electrocautery.  Skin flaps were elevated cephalad and caudad from the thyroid notch to the sternal notch.  A self-retaining retractor was placed for exposure.  Strap muscles were incised in the midline and dissection was begun on the right side.  Strap muscles were reflected laterally.  The right thyroid lobe was markedly enlarged by a dominant nodule occupying nearly the entire lobe.  The lobe was gently mobilized with blunt dissection.  Superior pole vessels were dissected out and divided individually between small and medium Ligaclips with the Harmonic scalpel.  The thyroid lobe was rolled anteriorly.  Branches of the inferior thyroid artery were divided between small Ligaclips with the Harmonic scalpel.  Inferior venous tributaries were divided between Ligaclips.  Both the superior and inferior parathyroid glands were identified and preserved on their vascular pedicles.  The recurrent laryngeal nerve was identified and preserved along its course.  The ligament of Gwenlyn Found was released with the electrocautery and the gland was mobilized onto the anterior trachea. Isthmus was mobilized across the midline.  There was no pyramidal lobe present.  The thyroid parenchyma was transected at the junction of the isthmus and contralateral thyroid lobe with the Harmonic scalpel.  The thyroid lobe and isthmus were submitted to pathology for review.  The neck was irrigated with warm saline.  Fibular was placed throughout the operative field.  Strap muscles were reapproximated in the midline with interrupted 3-0  Vicryl sutures.  Platysma was closed with interrupted 3-0 Vicryl sutures.  Skin was closed with a running 4-0 Monocryl subcuticular suture.  Wound was washed and dried and benzoin and steri-strips were applied.  Dry gauze dressing  was placed.  The patient was awakened from anesthesia and brought to the recovery room.  The patient tolerated the procedure well.   Earnstine Regal, MD, [facility] Office: [phone]

## 2015-01-03 NOTE — H&P (Signed)
General Surgery [facility]  [name] DOB: 02/22/1927 Undefined / Language: Donna Burch / Race: White Female  History of Present Illness  The patient is a 81 year old female who presents with a thyroid nodule. Patient is referred by Dr. Delrae Rend for evaluation of right thyroid mass. Patient has had a long-standing complex mass of the right thyroid lobe dating back to 2002. She has had 2 previous aspirations of the cystic portion of this mass. This improves her complaints of dysphagia. Recent ultrasound examination showed the right thyroid lobe to measure 6.9 x 4.2 x 4.7 cm. It was complex with partially cystic and partially solid components. Left thyroid lobe was normal in size with no identified nodules. There was no lymphadenopathy. Thyroid function is normal with a TSH level of 2.13. Patient states that she has never been on thyroid medication. She has had no prior head and neck surgery other than a small skin cancer removed from the right neck. There is a family history of thyroid surgery and the patient's sister. There is no family history of other endocrine neoplasms. Patient complains of significant dysphagia. She has a moderate globus sensation. She does have issues with esophageal reflux and hiatal hernia and is followed by Dr. Teena Irani from gastroenterology. Patient presents today to discuss right thyroid lobectomy for relief of her compressive symptoms.   Other Problems  Arthritis Gastroesophageal Reflux Disease  Past Surgical History Tonsillectomy  Diagnostic Studies History Mammogram 1-3 years ago Pap Smear >5 years ago  Allergies No Known Drug Allergies06/13/2016  Medication History  Aspirin EC (81MG  Tablet DR, Oral) Active. Magnesium Gluconate (250MG  Tablet, Oral) Active. Atorvastatin Calcium (80MG  Tablet, Oral) Active. Valsartan-Hydrochlorothiazide (160-12.5MG  Tablet, Oral) Active. AmLODIPine Besylate (10MG  Tablet,  Oral) Active. Omeprazole (20MG  Capsule DR, Oral) Active. Medications Reconciled  Social History Tobacco use Never smoker.  Review of Systems HEENT Present- Hoarseness and Wears glasses/contact lenses. Not Present- Earache, Hearing Loss, Nose Bleed, Oral Ulcers, Ringing in the Ears, Seasonal Allergies, Sinus Pain, Sore Throat, Visual Disturbances and Yellow Eyes.   Vitals Weight: 132 lb Height: 63in Body Surface Area: 1.63 m Body Mass Index: 23.38 kg/m Temp.: 97.97F(Temporal)  Pulse: 97 (Regular)  BP: 126/80 (Sitting, Left Arm, Standard)   Physical Exam  General - appears comfortable, no distress; not diaphorectic  HEENT - normocephalic; sclerae clear, gaze conjugate; mucous membranes moist, dentition good; voice normal  Neck - asymmetric on extension; no palpable anterior or posterior cervical adenopathy; obvious smooth relatively firm mass occupying most of the right thyroid lobe, approximately 6 cm in size, mobile, mildly tender; left lobe without palpable abnormality  Chest - clear bilaterally without rhonchi, rales, or wheeze  Cor - regular rhythm with normal rate; no significant murmur  Ext - non-tender without significant edema or lymphedema  Neuro - grossly intact; mild tremor    Assessment & Plan   THYROID NODULE, UNINODULAR (241.0  E04.1)  Patient presents today accompanied by her granddaughter. I provided them with written literature on thyroid surgery to review at home.  Patient has a dominant mass occupying much of the right thyroid lobe. This is partially cystic and she has improved symptomatically with 2 previous aspirations. Patient has noted some hoarseness. Her main complaint is globus sensation and dysphagia. She would like to proceed with right thyroid lobectomy which as been recommended by her endocrinologist.  I discussed right thyroid lobectomy with the patient and her granddaughter. I explained the location of the surgical  incision. We discussed potential  complications including recurrent laryngeal nerve injury and injury to parathyroid glands. We discussed the possible need for thyroid hormone replacement following surgery. We discussed the hospital stay to be anticipated. They understand and wish to proceed in the near future.  The risks and benefits of the procedure have been discussed at length with the patient. The patient understands the proposed procedure, potential alternative treatments, and the course of recovery to be expected. All of the patient's questions have been answered at this time. The patient wishes to proceed with surgery.  Earnstine Regal, MD, [facility] Office: [phone]

## 2015-01-03 NOTE — Transfer of Care (Signed)
Immediate Anesthesia Transfer of Care Note  Patient: Donna Burch  Procedure(s) Performed: Procedure(s): RIGHT THYROID LOBECTOMY (Right)  Patient Location: PACU  Anesthesia Type:General  Level of Consciousness: awake, alert , oriented and patient cooperative  Airway & Oxygen Therapy: Patient Spontanous Breathing and Patient connected to nasal cannula oxygen  Post-op Assessment: Report given to RN and Post -op Vital signs reviewed and stable  Post vital signs: Reviewed and stable  Last Vitals:  Filed Vitals:   01/03/15 0718  BP: 149/59  Pulse: 80  Temp: 36.6 C  Resp: 20    Complications: No apparent anesthesia complications

## 2015-01-03 NOTE — Interval H&P Note (Signed)
History and Physical Interval Note:  01/03/2015 8:51 AM  Donna Burch  has presented today for surgery, with the diagnosis of right thyroid nodule  The various methods of treatment have been discussed with the patient and family. After consideration of risks, benefits and other options for treatment, the patient has consented to    Procedure(s): RIGHT THYROID LOBECTOMY (Right) as a surgical intervention .    The patient's history has been reviewed, patient examined, no change in status, stable for surgery.  I have reviewed the patient's chart and labs.  Questions were answered to the patient's satisfaction.    Earnstine Regal, MD, [facility] Office: [place]

## 2015-01-03 NOTE — Anesthesia Postprocedure Evaluation (Signed)
  Anesthesia Post-op Note  Patient: Donna Burch  Procedure(s) Performed: Procedure(s): RIGHT THYROID LOBECTOMY (Right)  Patient Location: PACU  Anesthesia Type:General  Level of Consciousness: awake, alert  and oriented  Airway and Oxygen Therapy: Patient Spontanous Breathing and Patient connected to nasal cannula oxygen  Post-op Pain: mild  Post-op Assessment: Post-op Vital signs reviewed, Patient's Cardiovascular Status Stable, Respiratory Function Stable, Patent Airway and Pain level controlled              Post-op Vital Signs: stable  Last Vitals:  Filed Vitals:   01/03/15 2144  BP: 130/57  Pulse: 103  Temp: 36.8 C  Resp: 18    Complications: No apparent anesthesia complications

## 2015-01-03 NOTE — Anesthesia Preprocedure Evaluation (Addendum)
Anesthesia Evaluation  Patient identified by MRN, date of birth, ID band Patient awake    Reviewed: Allergy & Precautions, NPO status , Patient's Chart, lab work & pertinent test results, Unable to perform ROS - Chart review only  Airway Mallampati: II  TM Distance: >3 FB Neck ROM: Full    Dental  (+) Teeth Intact, Dental Advisory Given   Pulmonary  breath sounds clear to auscultation        Cardiovascular hypertension, Rhythm:Regular Rate:Normal     Neuro/Psych    GI/Hepatic   Endo/Other    Renal/GU      Musculoskeletal   Abdominal   Peds  Hematology   Anesthesia Other Findings   Reproductive/Obstetrics                            Anesthesia Physical Anesthesia Plan  ASA: II  Anesthesia Plan: General   Post-op Pain Management:    Induction: Intravenous  Airway Management Planned: Oral ETT  Additional Equipment:   Intra-op Plan:   Post-operative Plan: Extubation in OR  Informed Consent: I have reviewed the patients History and Physical, chart, labs and discussed the procedure including the risks, benefits and alternatives for the proposed anesthesia with the patient or authorized representative who has indicated his/her understanding and acceptance.   Dental advisory given  Plan Discussed with: CRNA and Anesthesiologist  Anesthesia Plan Comments:         Anesthesia Quick Evaluation

## 2015-01-04 ENCOUNTER — Encounter (HOSPITAL_COMMUNITY): Payer: Self-pay | Admitting: Surgery

## 2015-01-04 DIAGNOSIS — E042 Nontoxic multinodular goiter: Secondary | ICD-10-CM | POA: Diagnosis not present

## 2015-01-04 MED ORDER — HYDROCODONE-ACETAMINOPHEN 5-325 MG PO TABS: 1.0000 | ORAL_TABLET | ORAL | Status: DC | PRN

## 2015-01-04 NOTE — Progress Notes (Signed)
Patient discharged home with instructions. 

## 2015-01-04 NOTE — Discharge Summary (Signed)
Physician Discharge Summary [facility]  Patient ID: Donna Burch MRN: [id number] DOB/AGE: [birth year] 81 y.o.  Admit date: 01/03/2015 Discharge date: 01/04/2015  Admission Diagnoses:  Right thyroid nodule  Discharge Diagnoses:  Principal Problem:   Thyroid nodule, right Active Problems:   Thyroid nodule   Discharged Condition: good  Hospital Course: Patient was admitted for observation following thyroid surgery.  Post op course was uncomplicated.  Pain was well controlled.  Tolerated diet.  Patient was prepared for discharge home on POD#1.  Consults: None  Treatments: surgery: right thyroid lobectomy  Discharge Exam: Blood pressure 126/61, pulse 99, temperature 98.8 F (37.1 C), temperature source Oral, resp. rate 18, height 5' 3.5" (1.613 m), weight 59.875 kg (132 lb), SpO2 93 %. HEENT - clear Neck - wound dry with small blood on dressing; voice normal, soft Chest - clear bilaterally Cor - RRR  Disposition: Home  Discharge Instructions    Apply dressing    Complete by:  As directed   Apply light gauze dressing to wound before discharge home today.     Diet - low sodium heart healthy    Complete by:  As directed      Discharge instructions    Complete by:  As directed   [facility]  THYROID & PARATHYROID SURGERY:  POST-OP INSTRUCTIONS  Always review your discharge instruction sheet from the facility where your surgery was performed.  A prescription for pain medication may be given to you upon discharge.  Take your pain medication as prescribed.  If narcotic pain medicine is not needed, then you may take acetaminophen (Tylenol) or ibuprofen (Advil) as needed.  Take your usually prescribed medications unless otherwise directed.  If you need a refill on your pain medication, please contact your pharmacy. They will contact our office to request authorization.  Prescriptions will not be processed by our office after 5 pm or on  weekends.  Start with a light diet upon arrival home, such as soup and crackers or toast.  Be sure to drink plenty of fluids daily.  Resume your normal diet the day after surgery.  Most patients will experience some swelling and bruising on the chest and neck area.  Ice packs will help.  Swelling and bruising can take several days to resolve.   It is common to experience some constipation after surgery.  Increasing fluid intake and taking a stool softener will usually help or prevent this problem.  A mild laxative (Milk of Magnesia or Miralax) should be taken according to package directions if there has been no bowel movement after 48 hours.  You have steri-strips and a gauze dressing over your incision.  You may remove the gauze bandage on the second day after surgery, and you may shower at that time.  Leave your steri-strips (small skin tapes) in place directly over the incision.  These strips should remain on the skin for 5-7 days and then be removed.  You may get them wet in the shower and pat them dry.  You may resume regular (light) daily activities beginning the next day - such as daily self-care, walking, climbing stairs - gradually increasing activities as tolerated.  You may have sexual intercourse when it is comfortable.  Refrain from any heavy lifting or straining until approved by your doctor.  You may drive when you no longer are taking prescription pain medication, you can comfortably wear a seatbelt, and you can safely maneuver your car and apply brakes.  You should see your doctor in the office for a follow-up appointment approximately two to three weeks after your surgery.  Make sure that you call for this appointment within a day or two after you arrive home to insure a convenient appointment time.  WHEN TO CALL YOUR DOCTOR: -- Fever greater than 101.5 -- Inability to urinate -- Nausea and/or vomiting - persistent -- Extreme swelling or bruising -- Continued bleeding from  incision -- Increased pain, redness, or drainage from the incision -- Difficulty swallowing or breathing -- Muscle cramping or spasms -- Numbness or tingling in hands or around lips  The clinic staff is available to answer your questions during regular business hours.  Please don't hesitate to call and ask to speak to one of the nurses if you have concerns.  Earnstine Regal, MD, [facility] Office: [phone]  Website: [email or web address]     Increase activity slowly    Complete by:  As directed      Remove dressing in 24 hours    Complete by:  As directed             Medication List    TAKE these medications        amLODipine 5 MG tablet  Commonly known as:  NORVASC  Take 5 mg by mouth daily.     aspirin 81 MG tablet  Take 81 mg by mouth daily.     atorvastatin 80 MG tablet  Commonly known as:  LIPITOR  Take 80 mg by mouth daily at 6 PM.     cholecalciferol 1000 UNITS tablet  Commonly known as:  VITAMIN D  Take 2,000 Units by mouth 2 (two) times daily.     COLON FORMULA PO  Take 1 capsule by mouth daily.     HYDROcodone-acetaminophen 5-325 MG per tablet  Commonly known as:  NORCO/VICODIN  Take 1-2 tablets by mouth every 4 (four) hours as needed for moderate pain.     omeprazole 20 MG capsule  Commonly known as:  PRILOSEC  Take 20 mg by mouth daily.     SLOW-MAG 64 MG Tbec SR tablet  Generic drug:  magnesium chloride  Take 1 tablet by mouth 2 (two) times daily.     valsartan-hydrochlorothiazide 160-12.5 MG per tablet  Commonly known as:  DIOVAN-HCT  Take 1 tablet by mouth daily.         Earnstine Regal, MD, [facility] Office: [phone]   Signed: Earnstine Regal 01/04/2015, 7:46 AM

## 2015-01-05 NOTE — Progress Notes (Signed)
Quick Note:  Please contact patient and notify of benign pathology results.  Amillya Chavira M. Wrenn Willcox, MD, FACS [facility] Office: [phone]   ______ 

## 2015-02-09 DIAGNOSIS — K219 Gastro-esophageal reflux disease without esophagitis: Secondary | ICD-10-CM | POA: Diagnosis not present

## 2015-02-09 DIAGNOSIS — Z23 Encounter for immunization: Secondary | ICD-10-CM | POA: Diagnosis not present

## 2015-02-09 DIAGNOSIS — M79645 Pain in left finger(s): Secondary | ICD-10-CM | POA: Diagnosis not present

## 2015-02-09 DIAGNOSIS — M25561 Pain in right knee: Secondary | ICD-10-CM | POA: Diagnosis not present

## 2015-02-09 DIAGNOSIS — M79644 Pain in right finger(s): Secondary | ICD-10-CM | POA: Diagnosis not present

## 2015-02-09 DIAGNOSIS — I129 Hypertensive chronic kidney disease with stage 1 through stage 4 chronic kidney disease, or unspecified chronic kidney disease: Secondary | ICD-10-CM | POA: Diagnosis not present

## 2015-02-09 DIAGNOSIS — N183 Chronic kidney disease, stage 3 (moderate): Secondary | ICD-10-CM | POA: Diagnosis not present

## 2015-02-09 DIAGNOSIS — E785 Hyperlipidemia, unspecified: Secondary | ICD-10-CM | POA: Diagnosis not present

## 2015-02-10 DIAGNOSIS — H43813 Vitreous degeneration, bilateral: Secondary | ICD-10-CM | POA: Diagnosis not present

## 2015-02-10 DIAGNOSIS — H04123 Dry eye syndrome of bilateral lacrimal glands: Secondary | ICD-10-CM | POA: Diagnosis not present

## 2015-02-10 DIAGNOSIS — Z961 Presence of intraocular lens: Secondary | ICD-10-CM | POA: Diagnosis not present

## 2015-02-10 DIAGNOSIS — H52203 Unspecified astigmatism, bilateral: Secondary | ICD-10-CM | POA: Diagnosis not present

## 2015-02-15 DIAGNOSIS — M79641 Pain in right hand: Secondary | ICD-10-CM | POA: Diagnosis not present

## 2015-02-15 DIAGNOSIS — R202 Paresthesia of skin: Secondary | ICD-10-CM | POA: Diagnosis not present

## 2015-02-15 DIAGNOSIS — M65331 Trigger finger, right middle finger: Secondary | ICD-10-CM | POA: Diagnosis not present

## 2015-02-21 DIAGNOSIS — R1310 Dysphagia, unspecified: Secondary | ICD-10-CM | POA: Diagnosis not present

## 2015-03-15 DIAGNOSIS — R202 Paresthesia of skin: Secondary | ICD-10-CM | POA: Diagnosis not present

## 2015-03-15 DIAGNOSIS — M65331 Trigger finger, right middle finger: Secondary | ICD-10-CM | POA: Diagnosis not present

## 2015-03-20 DIAGNOSIS — Z8639 Personal history of other endocrine, nutritional and metabolic disease: Secondary | ICD-10-CM | POA: Diagnosis not present

## 2015-03-20 DIAGNOSIS — R5383 Other fatigue: Secondary | ICD-10-CM | POA: Diagnosis not present

## 2015-03-20 DIAGNOSIS — E049 Nontoxic goiter, unspecified: Secondary | ICD-10-CM | POA: Diagnosis not present

## 2015-03-22 DIAGNOSIS — D72820 Lymphocytosis (symptomatic): Secondary | ICD-10-CM | POA: Diagnosis not present

## 2015-03-22 DIAGNOSIS — E890 Postprocedural hypothyroidism: Secondary | ICD-10-CM | POA: Diagnosis not present

## 2015-03-22 DIAGNOSIS — R5383 Other fatigue: Secondary | ICD-10-CM | POA: Diagnosis not present

## 2015-03-22 DIAGNOSIS — R700 Elevated erythrocyte sedimentation rate: Secondary | ICD-10-CM | POA: Diagnosis not present

## 2015-04-04 DIAGNOSIS — M65331 Trigger finger, right middle finger: Secondary | ICD-10-CM | POA: Diagnosis not present

## 2015-04-04 DIAGNOSIS — R202 Paresthesia of skin: Secondary | ICD-10-CM | POA: Diagnosis not present

## 2015-04-11 ENCOUNTER — Encounter: Payer: Self-pay | Admitting: Hematology

## 2015-04-11 ENCOUNTER — Other Ambulatory Visit (HOSPITAL_COMMUNITY)
Admission: RE | Admit: 2015-04-11 | Discharge: 2015-04-11 | Disposition: A | Discharge status: Home / Self Care | Payer: Medicare Other | Source: Ambulatory Visit | Attending: Hematology | Admitting: Hematology

## 2015-04-11 ENCOUNTER — Telehealth: Payer: Self-pay | Admitting: Hematology

## 2015-04-11 ENCOUNTER — Ambulatory Visit (HOSPITAL_BASED_OUTPATIENT_CLINIC_OR_DEPARTMENT_OTHER): Payer: Medicare Other | Admitting: Hematology

## 2015-04-11 ENCOUNTER — Other Ambulatory Visit: Payer: PRIVATE HEALTH INSURANCE

## 2015-04-11 ENCOUNTER — Other Ambulatory Visit (HOSPITAL_BASED_OUTPATIENT_CLINIC_OR_DEPARTMENT_OTHER): Payer: Medicare Other

## 2015-04-11 VITALS — BP 154/59 | HR 90 | Temp 97.5°F | Resp 18 | Ht 63.5 in | Wt 132.6 lb

## 2015-04-11 DIAGNOSIS — D649 Anemia, unspecified: Secondary | ICD-10-CM

## 2015-04-11 DIAGNOSIS — C9110 Chronic lymphocytic leukemia of B-cell type not having achieved remission: Secondary | ICD-10-CM | POA: Diagnosis not present

## 2015-04-11 DIAGNOSIS — D72820 Lymphocytosis (symptomatic): Secondary | ICD-10-CM | POA: Insufficient documentation

## 2015-04-11 LAB — CBC & DIFF AND RETIC
BASO%: 0.4 % (ref 0.0–2.0)
Basophils Absolute: 0.1 10*3/uL (ref 0.0–0.1)
EOS%: 2.5 % (ref 0.0–7.0)
Eosinophils Absolute: 0.4 10*3/uL (ref 0.0–0.5)
HCT: 31.6 % — ABNORMAL LOW (ref 34.8–46.6)
HGB: 10.9 g/dL — ABNORMAL LOW (ref 11.6–15.9)
Immature Retic Fract: 2.4 % (ref 1.60–10.00)
LYMPH%: 54.8 % — AB (ref 14.0–49.7)
MCH: 31.1 pg (ref 25.1–34.0)
MCHC: 34.5 g/dL (ref 31.5–36.0)
MCV: 90.3 fL (ref 79.5–101.0)
MONO#: 1.1 10*3/uL — ABNORMAL HIGH (ref 0.1–0.9)
MONO%: 7.7 % (ref 0.0–14.0)
NEUT#: 4.9 10*3/uL (ref 1.5–6.5)
NEUT%: 34.6 % — AB (ref 38.4–76.8)
PLATELETS: 269 10*3/uL (ref 145–400)
RBC: 3.5 10*6/uL — AB (ref 3.70–5.45)
RDW: 13.7 % (ref 11.2–14.5)
Retic %: 1.2 % (ref 0.70–2.10)
Retic Ct Abs: 42 10*3/uL (ref 33.70–90.70)
WBC: 14.1 10*3/uL — ABNORMAL HIGH (ref 3.9–10.3)
lymph#: 7.7 10*3/uL — ABNORMAL HIGH (ref 0.9–3.3)

## 2015-04-11 LAB — COMPREHENSIVE METABOLIC PANEL (CC13)
ALT: 20 U/L (ref 0–55)
ANION GAP: 8 meq/L (ref 3–11)
AST: 25 U/L (ref 5–34)
Albumin: 3.5 g/dL (ref 3.5–5.0)
Alkaline Phosphatase: 88 U/L (ref 40–150)
BUN: 24.7 mg/dL (ref 7.0–26.0)
CHLORIDE: 101 meq/L (ref 98–109)
CO2: 23 meq/L (ref 22–29)
Calcium: 9.5 mg/dL (ref 8.4–10.4)
Creatinine: 0.9 mg/dL (ref 0.6–1.1)
EGFR: 61 mL/min/{1.73_m2} — AB (ref >90)
Glucose: 94 mg/dl (ref 70–140)
POTASSIUM: 4.2 meq/L (ref 3.5–5.1)
Sodium: 132 mEq/L — ABNORMAL LOW (ref 136–145)
Total Bilirubin: 0.4 mg/dL (ref 0.20–1.20)
Total Protein: 7.1 g/dL (ref 6.4–8.3)

## 2015-04-11 LAB — LACTATE DEHYDROGENASE (CC13): LDH: 169 U/L (ref 125–245)

## 2015-04-11 LAB — CHCC SMEAR

## 2015-04-11 NOTE — Telephone Encounter (Signed)
per pof to sch pt appt-gave pt copy of avs °

## 2015-04-11 NOTE — Progress Notes (Signed)
[name]    HEMATOLOGY/ONCOLOGY CONSULTATION NOTE  Date of Service: 04/11/2015  Patient Care Team: Harlan Stains, MD as PCP - General (Family Medicine)  CHIEF COMPLAINTS/PURPOSE OF CONSULTATION:  Lymphocytosis  HISTORY OF PRESENTING ILLNESS:  Donna Burch is a wonderful 81 y.o. female who has been referred to [place] by Dr Harlan Stains for evaluation and management of lymphocytosis.  She has a history of hypertension, dyslipidemia, renal insufficiency, GERD, seasonal allergies who recently in July 2016 had a left thyroid lobectomy for a thyroid nodule which showed benign multinodular goiter with scattered area of lymphocytic thyroiditis. Around that time she had labs which showed some mild lymphocytosis. She was having some fatigue after surgery and decreased appetite but no fevers or night sweats. Her CBC was followed up with a repeat CBC on 03/22/2015 which showed increase in leukocytosis with a WBC count of 16.9k of which 9.5k were lymphocytes. As a result of her increasing lymphocytosis she was referred to [place] for further evaluation. In the clinic today the patient notes she is feeling overall quite okay. Has not noticed any increasing lymph nodes swelling in her neck under the armpits or her groin. No abdominal pain or distention. No fever/chills/night sweats. The fatigue that she had after surgery has improved. She had repeat blood counts today that showed WBC count of 14.1k with 7.7k lymphocytes. Hemoglobin was 10.9 with an MCV of 90.3. Platelets were normal at 269k. Her hemoglobin is not a significant change since last month.  She has no other acute new symptoms. He is agreeable to blood testing to figure out the cause of her lymphocytosis.  MEDICAL HISTORY:  Past Medical History  Diagnosis Date  . Elevated brain natriuretic peptide (BNP) level     Slight  . Hiatal hernia   . Hypertension   . Hyperlipidemia   . Nocturia     x3  . Gastroenteritis 09/2010    c. dificile.  hospitalized  in ICU x 3 days  . Dyspnea on exertion     Improved with exercise  . Renal insufficiency   . Nephrolithiasis   . GERD (gastroesophageal reflux disease)   . History of Clostridium difficile ?03/2012    "after taking ATB for one of my UTI's"  . Wears glasses   . Seasonal allergies   . Skin cancer     right anterior neck  . PONV (postoperative nausea and vomiting)   . Family history of adverse reaction to anesthesia     son, Marita Snellen, w/PONV  . Pneumonia     Required hospital stay at the time  . Hypothyroidism   . Frequent UTI     "used to; none lately" (01/03/2015)  . Arthritis     "right middle finger; hands" (01/03/2015)    SURGICAL HISTORY: Past Surgical History  Procedure Laterality Date  . Cataract extraction, bilateral  2010  . Wisdom tooth extraction    . Lithotripsy      "didn't work"  . Esophagogastroduodenoscopy    . Esophagogastroduodenoscopy (egd) with esophageal dilation    . Colonoscopy    . Knee arthroscopy Left   . Thyroid lobectomy Right 01/03/2015  . Cystoscopy with stent placement    . Cystoscopy w/ stone manipulation Right   . Tonsillectomy    . Skin cancer excision Right     anterior neck  . Thyroid lobectomy Right 01/03/2015    Procedure: RIGHT THYROID LOBECTOMY;  Surgeon: Armandina Gemma, MD;  Location: [facility];  Service: General;  Laterality: Right;  SOCIAL HISTORY: Social History   Social History  . Marital Status: Widowed    Spouse Name: N/A  . Number of Children: N/A  . Years of Education: N/A   Occupational History  . Not on file.   Social History Main Topics  . Smoking status: Never Smoker   . Smokeless tobacco: Never Used  . Alcohol Use: No  . Drug Use: No  . Sexual Activity: No   Other Topics Concern  . Not on file   Social History Narrative    FAMILY HISTORY: Family History  Problem Relation Age of Onset  . Heart attack Mother   . Hyperlipidemia Mother   . Asthma Father   . Diabetes Father     ALLERGIES:  is allergic to  prednisone and reclast.  MEDICATIONS:  Current Outpatient Prescriptions  Medication Sig Dispense Refill  . amLODipine (NORVASC) 5 MG tablet Take 5 mg by mouth daily.    [name] aspirin 81 MG tablet Take 81 mg by mouth daily.    [name] atorvastatin (LIPITOR) 80 MG tablet Take 80 mg by mouth daily at 6 PM.     . cholecalciferol (VITAMIN D) 1000 UNITS tablet Take 2,000 Units by mouth 2 (two) times daily.     [name] HYDROcodone-acetaminophen (NORCO/VICODIN) 5-325 MG per tablet Take 1-2 tablets by mouth every 4 (four) hours as needed for moderate pain. 20 tablet 0  . levothyroxine (SYNTHROID, LEVOTHROID) 50 MCG tablet     . magnesium chloride (SLOW-MAG) 64 MG TBEC SR tablet Take 1 tablet by mouth 2 (two) times daily.     . Nutritional Supplements (COLON FORMULA PO) Take 1 capsule by mouth daily.     [name] omeprazole (PRILOSEC) 20 MG capsule Take 20 mg by mouth daily.    . valsartan-hydrochlorothiazide (DIOVAN-HCT) 160-12.5 MG per tablet Take 1 tablet by mouth daily.     No current facility-administered medications for this visit.    REVIEW OF SYSTEMS:    10 Point review of Systems was done is negative except as noted above.  PHYSICAL EXAMINATION: ECOG PERFORMANCE STATUS: 2 - Symptomatic, <50% confined to bed  . Filed Vitals:   04/11/15 1038  BP: 154/59  Pulse: 90  Temp: 97.5 F (36.4 C)  Resp: 18   Filed Weights   04/11/15 1038  Weight: 132 lb 9.6 oz (60.147 kg)   .Body mass index is 23.12 kg/(m^2).  GENERAL: elderly lady in no acute distress and comfortable . SKIN: skin color, texture, turgor are normal, no rashes or significant lesions EYES: normal, conjunctiva are pink and non-injected, sclera clear OROPHARYNX:no exudate, no erythema and lips, buccal mucosa, and tongue normal  NECK: supple, no JVD, thyroid normal size, non-tender, without nodularity LYMPH:  no palpable lymphadenopathy in the cervical, axillary or inguinal LUNGS: clear to auscultation with normal respiratory effort HEART:  regular rate & rhythm,  no murmurs and no lower extremity edema ABDOMEN: abdomen soft, non-tender, normoactive bowel sounds  Musculoskeletal: no cyanosis of digits and no clubbing  PSYCH: alert & oriented x 3 with fluent speech NEURO: no focal motor/sensory deficits  LABORATORY DATA:  I have reviewed the data as listed  . CBC Latest Ref Rng 04/11/2015 01/02/2015 12/01/2014  WBC 3.9 - 10.3 10e3/uL 14.1(H) 13.1(H) 12.9(H)  Hemoglobin 11.6 - 15.9 g/dL 10.9(L) 11.2(L) 11.3(L)  Hematocrit 34.8 - 46.6 % 31.6(L) 33.5(L) 33.0(L)  Platelets 145 - 400 10e3/uL 269 252 234    . CMP Latest Ref Rng 04/11/2015 01/02/2015 12/01/2014  Glucose 70 - 140  mg/dl 94 106(H) 126(H)  BUN 7.0 - 26.0 mg/dL 24.7 20 19   Creatinine 0.6 - 1.1 mg/dL 0.9 1.00 1.06(H)  Sodium 136 - 145 mEq/L 132(L) 139 131(L)  Potassium 3.5 - 5.1 mEq/L 4.2 4.6 4.1  Chloride 101 - 111 mmol/L - 106 99(L)  CO2 22 - 29 mEq/L 23 26 22   Calcium 8.4 - 10.4 mg/dL 9.5 9.8 9.5  Total Protein 6.4 - 8.3 g/dL 7.1 - 7.0  Total Bilirubin 0.20 - 1.20 mg/dL 0.40 - 0.3  Alkaline Phos 40 - 150 U/L 88 - 69  AST 5 - 34 U/L 25 - 27  ALT 0 - 55 U/L 20 - 15   Component     Latest Ref Rng 04/11/2015  HCV Ab     NEGATIVE NEGATIVE  HIV     NONREACTIVE NONREACTIVE  LDH     125 - 245 U/L 169   Peripheral Blood Smear (04/11/2015)  Normocytic normochromic RBCs , nontender schistocytes, few acanthocytes. Increased mature appearing lymphocytes and multiple smudge cells suggestive of CLL .no increased blasts .pelgeroid neutrophils noted.      RADIOGRAPHIC STUDIES: I have personally reviewed the radiological images as listed and agreed with the findings in the report. No results found.  ASSESSMENT & PLAN:   81 year old female with multiple medical comorbidities as noted above.  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 Rai stage 0 - no discernible lymphadenopathy, splenomegaly. Has mild anemia but this seems to be less likely from CLL. Given her pelgeroid  neutrophils and acanthocytes cannot rule out some element of age-related myelodysplasia. No thrombocytopenia noted. Patient has no overt constitutional symptoms such as fevers/chills/drenching night sweats/weight loss more than 10% of body weight. Plan. -Patient was counseled about this likely diagnosis, natural history of the disease and prognosis as well as indications for treatment. -The current status of her CLL does not want any treatment at this time. -We will get a CLL fish panel on her next follow-up. -Return to care in 3 months with repeat CBC, CMP and CLL fish panel. -If her counts remain stable on three-month follow-up might be able to follow her every 6 months depending on the tempo of her disease and lymphocyte doubling time.  All of the patients questions were answeredto her apparent satisfaction. The patient knows to call the clinic with any problems, questions or concerns.  I spent 50 minutes counseling the patient face to face. The total time spent in the appointment was 60 minutes and more than 50% was on counseling and direct patient cares.    Sullivan Lone MD [place] AAHIVMS [facility] Hematology/Oncology Physician [place]  (Office):       [phone] (Work cell):  [phone] (Fax):           [phone]  04/11/2015 10:51 AM

## 2015-04-12 LAB — HIV ANTIBODY (ROUTINE TESTING W REFLEX): HIV 1&2 Ab, 4th Generation: NONREACTIVE

## 2015-04-12 LAB — HEPATITIS C ANTIBODY: HCV Ab: NEGATIVE

## 2015-04-14 LAB — FLOW CYTOMETRY

## 2015-05-02 ENCOUNTER — Telehealth: Payer: Self-pay | Admitting: *Deleted

## 2015-05-02 DIAGNOSIS — R202 Paresthesia of skin: Secondary | ICD-10-CM | POA: Diagnosis not present

## 2015-05-02 NOTE — Telephone Encounter (Signed)
RN contacted family member and explained to her the current condition and information needed. Patient family member verbalized understanding.

## 2015-05-02 NOTE — Telephone Encounter (Signed)
TC from pt's daughter Alverda Skeans) regarding what she feels is conflicting reports of her mother's condition.  She is requesting call back from Dr. Irene Limbo regarding her mother's 'leukemia'  Please call daughter @ [phone]

## 2015-05-16 DIAGNOSIS — E890 Postprocedural hypothyroidism: Secondary | ICD-10-CM | POA: Diagnosis not present

## 2015-06-01 DIAGNOSIS — R202 Paresthesia of skin: Secondary | ICD-10-CM | POA: Diagnosis not present

## 2015-06-02 DIAGNOSIS — E890 Postprocedural hypothyroidism: Secondary | ICD-10-CM | POA: Diagnosis not present

## 2015-06-02 DIAGNOSIS — R5383 Other fatigue: Secondary | ICD-10-CM | POA: Diagnosis not present

## 2015-06-02 DIAGNOSIS — N814 Uterovaginal prolapse, unspecified: Secondary | ICD-10-CM | POA: Diagnosis not present

## 2015-06-02 DIAGNOSIS — K5900 Constipation, unspecified: Secondary | ICD-10-CM | POA: Diagnosis not present

## 2015-06-07 DIAGNOSIS — G5601 Carpal tunnel syndrome, right upper limb: Secondary | ICD-10-CM | POA: Diagnosis not present

## 2015-06-27 DIAGNOSIS — G5601 Carpal tunnel syndrome, right upper limb: Secondary | ICD-10-CM | POA: Diagnosis not present

## 2015-06-27 DIAGNOSIS — G5602 Carpal tunnel syndrome, left upper limb: Secondary | ICD-10-CM | POA: Diagnosis not present

## 2015-06-28 DIAGNOSIS — R5383 Other fatigue: Secondary | ICD-10-CM | POA: Diagnosis not present

## 2015-06-28 DIAGNOSIS — Z8639 Personal history of other endocrine, nutritional and metabolic disease: Secondary | ICD-10-CM | POA: Diagnosis not present

## 2015-06-28 DIAGNOSIS — E039 Hypothyroidism, unspecified: Secondary | ICD-10-CM | POA: Diagnosis not present

## 2015-06-28 DIAGNOSIS — L659 Nonscarring hair loss, unspecified: Secondary | ICD-10-CM | POA: Diagnosis not present

## 2015-06-30 DIAGNOSIS — G5602 Carpal tunnel syndrome, left upper limb: Secondary | ICD-10-CM | POA: Diagnosis not present

## 2015-07-13 DIAGNOSIS — K5900 Constipation, unspecified: Secondary | ICD-10-CM | POA: Diagnosis not present

## 2015-07-13 DIAGNOSIS — N183 Chronic kidney disease, stage 3 (moderate): Secondary | ICD-10-CM | POA: Diagnosis not present

## 2015-07-13 DIAGNOSIS — G5602 Carpal tunnel syndrome, left upper limb: Secondary | ICD-10-CM | POA: Diagnosis not present

## 2015-07-13 DIAGNOSIS — I129 Hypertensive chronic kidney disease with stage 1 through stage 4 chronic kidney disease, or unspecified chronic kidney disease: Secondary | ICD-10-CM | POA: Diagnosis not present

## 2015-07-13 DIAGNOSIS — E785 Hyperlipidemia, unspecified: Secondary | ICD-10-CM | POA: Diagnosis not present

## 2015-07-13 DIAGNOSIS — R531 Weakness: Secondary | ICD-10-CM | POA: Diagnosis not present

## 2015-07-13 DIAGNOSIS — H9190 Unspecified hearing loss, unspecified ear: Secondary | ICD-10-CM | POA: Diagnosis not present

## 2015-07-14 ENCOUNTER — Telehealth: Payer: Self-pay | Admitting: Hematology

## 2015-07-14 ENCOUNTER — Ambulatory Visit (HOSPITAL_BASED_OUTPATIENT_CLINIC_OR_DEPARTMENT_OTHER): Payer: Medicare Other

## 2015-07-14 ENCOUNTER — Ambulatory Visit (HOSPITAL_BASED_OUTPATIENT_CLINIC_OR_DEPARTMENT_OTHER): Payer: Medicare Other | Admitting: Hematology

## 2015-07-14 VITALS — BP 141/65 | HR 76 | Temp 97.5°F | Resp 18 | Ht 63.5 in | Wt 130.5 lb

## 2015-07-14 DIAGNOSIS — C9110 Chronic lymphocytic leukemia of B-cell type not having achieved remission: Secondary | ICD-10-CM | POA: Diagnosis not present

## 2015-07-14 DIAGNOSIS — D649 Anemia, unspecified: Secondary | ICD-10-CM

## 2015-07-14 LAB — COMPREHENSIVE METABOLIC PANEL
ALT: 17 U/L (ref 0–55)
AST: 23 U/L (ref 5–34)
Albumin: 3.2 g/dL — ABNORMAL LOW (ref 3.5–5.0)
Alkaline Phosphatase: 84 U/L (ref 40–150)
Anion Gap: 8 mEq/L (ref 3–11)
BILIRUBIN TOTAL: 0.36 mg/dL (ref 0.20–1.20)
BUN: 22 mg/dL (ref 7.0–26.0)
CHLORIDE: 104 meq/L (ref 98–109)
CO2: 22 meq/L (ref 22–29)
Calcium: 9.5 mg/dL (ref 8.4–10.4)
Creatinine: 1 mg/dL (ref 0.6–1.1)
EGFR: 52 mL/min/{1.73_m2} — AB (ref >90)
GLUCOSE: 100 mg/dL (ref 70–140)
Potassium: 4.5 mEq/L (ref 3.5–5.1)
SODIUM: 135 meq/L — AB (ref 136–145)
TOTAL PROTEIN: 7 g/dL (ref 6.4–8.3)

## 2015-07-14 LAB — CBC & DIFF AND RETIC
BASO%: 0.4 % (ref 0.0–2.0)
Basophils Absolute: 0 10*3/uL (ref 0.0–0.1)
EOS ABS: 0.3 10*3/uL (ref 0.0–0.5)
EOS%: 3 % (ref 0.0–7.0)
HCT: 31.9 % — ABNORMAL LOW (ref 34.8–46.6)
HGB: 10.7 g/dL — ABNORMAL LOW (ref 11.6–15.9)
Immature Retic Fract: 1.3 % — ABNORMAL LOW (ref 1.60–10.00)
LYMPH%: 47.5 % (ref 14.0–49.7)
MCH: 30.5 pg (ref 25.1–34.0)
MCHC: 33.5 g/dL (ref 31.5–36.0)
MCV: 90.9 fL (ref 79.5–101.0)
MONO#: 0.9 10*3/uL (ref 0.1–0.9)
MONO%: 8.1 % (ref 0.0–14.0)
NEUT%: 41 % (ref 38.4–76.8)
NEUTROS ABS: 4.4 10*3/uL (ref 1.5–6.5)
Platelets: 279 10*3/uL (ref 145–400)
RBC: 3.51 10*6/uL — AB (ref 3.70–5.45)
RDW: 13.2 % (ref 11.2–14.5)
RETIC CT ABS: 34.05 10*3/uL (ref 33.70–90.70)
Retic %: 0.97 % (ref 0.70–2.10)
WBC: 10.8 10*3/uL — ABNORMAL HIGH (ref 3.9–10.3)
lymph#: 5.1 10*3/uL — ABNORMAL HIGH (ref 0.9–3.3)

## 2015-07-14 NOTE — Telephone Encounter (Signed)
GAVE PATIENT AVS REPORT AND APPOINTMENTS FOR July. PATIENT SENT BACK TO LAB TODAY.

## 2015-07-19 ENCOUNTER — Encounter: Payer: Self-pay | Admitting: Hematology

## 2015-07-19 NOTE — Progress Notes (Signed)
[name]  HEMATOLOGY ONCOLOGY PROGRESS NOTE  Date of service: .07/14/2015  Patient Care Team: Harlan Stains, MD as PCP - General (Family Medicine)  Diagnosis: CLL Rai Stage 0  Current Treatment: Observation  INTERVAL HISTORY: Ms Gunnett is here for her scheduled follow-up along with her daughter. She notes no acute new symptoms. No fevers or chills. No night sweats. No enlarged lymph nodes. No unexplained weight loss. Her WBC counts are stable and are in fact lower than last visit. CLL Fish panel was sent with labs and is currently pending. No other acute new focal symptoms.  REVIEW OF SYSTEMS:    10 Point review of systems of done and is negative except as noted above.  . Past Medical History  Diagnosis Date  . Elevated brain natriuretic peptide (BNP) level     Slight  . Hiatal hernia   . Hypertension   . Hyperlipidemia   . Nocturia     x3  . Gastroenteritis 09/2010    c. dificile.  hospitalized in ICU x 3 days  . Dyspnea on exertion     Improved with exercise  . Renal insufficiency   . Nephrolithiasis   . GERD (gastroesophageal reflux disease)   . History of Clostridium difficile ?03/2012    "after taking ATB for one of my UTI's"  . Wears glasses   . Seasonal allergies   . Skin cancer     right anterior neck  . PONV (postoperative nausea and vomiting)   . Family history of adverse reaction to anesthesia     son, Marita Snellen, w/PONV  . Pneumonia     Required hospital stay at the time  . Hypothyroidism   . Frequent UTI     "used to; none lately" (01/03/2015)  . Arthritis     "right middle finger; hands" (01/03/2015)    . Past Surgical History  Procedure Laterality Date  . Cataract extraction, bilateral  2010  . Wisdom tooth extraction    . Lithotripsy      "didn't work"  . Esophagogastroduodenoscopy    . Esophagogastroduodenoscopy (egd) with esophageal dilation    . Colonoscopy    . Knee arthroscopy Left   . Thyroid lobectomy Right 01/03/2015  . Cystoscopy with stent  placement    . Cystoscopy w/ stone manipulation Right   . Tonsillectomy    . Skin cancer excision Right     anterior neck  . Thyroid lobectomy Right 01/03/2015    Procedure: RIGHT THYROID LOBECTOMY;  Surgeon: Armandina Gemma, MD;  Location: [facility];  Service: General;  Laterality: Right;    . Social History  Substance Use Topics  . Smoking status: Never Smoker   . Smokeless tobacco: Never Used  . Alcohol Use: No    ALLERGIES:  is allergic to prednisone and reclast.  MEDICATIONS:  Current Outpatient Prescriptions  Medication Sig Dispense Refill  . amLODipine (NORVASC) 5 MG tablet Take 5 mg by mouth daily.    [name] aspirin 81 MG tablet Take 81 mg by mouth daily.    [name] atorvastatin (LIPITOR) 80 MG tablet Take 80 mg by mouth daily at 6 PM.     . cholecalciferol (VITAMIN D) 1000 UNITS tablet Take 2,000 Units by mouth 2 (two) times daily.     [name] HYDROcodone-acetaminophen (NORCO/VICODIN) 5-325 MG per tablet Take 1-2 tablets by mouth every 4 (four) hours as needed for moderate pain. 20 tablet 0  . levothyroxine (SYNTHROID, LEVOTHROID) 50 MCG tablet     . magnesium chloride (SLOW-MAG) 64  MG TBEC SR tablet Take 1 tablet by mouth 2 (two) times daily.     . Nutritional Supplements (COLON FORMULA PO) Take 1 capsule by mouth daily.     [name] omeprazole (PRILOSEC) 20 MG capsule Take 20 mg by mouth daily.    . valsartan-hydrochlorothiazide (DIOVAN-HCT) 160-12.5 MG per tablet Take 1 tablet by mouth daily.     No current facility-administered medications for this visit.    PHYSICAL EXAMINATION: ECOG PERFORMANCE STATUS: 2 - Symptomatic, <50% confined to bed  . Filed Vitals:   07/14/15 1003  BP: 141/65  Pulse: 76  Temp: 97.5 F (36.4 C)  Resp: 18    Filed Weights   07/14/15 1003  Weight: 130 lb 8 oz (59.194 kg)   .Body mass index is 22.75 kg/(m^2).  GENERAL: elderly lady in no acute distress and comfortable . SKIN: skin color, texture, turgor are normal, no rashes or significant lesions EYES:  normal, conjunctiva are pink and non-injected, sclera clear OROPHARYNX:no exudate, no erythema and lips, buccal mucosa, and tongue normal  NECK: supple, no JVD, thyroid normal size, non-tender, without nodularity LYMPH: no palpable lymphadenopathy in the cervical, axillary or inguinal LUNGS: clear to auscultation with normal respiratory effort HEART: regular rate & rhythm, no murmurs and no lower extremity edema ABDOMEN: abdomen soft, non-tender, normoactive bowel sounds  Musculoskeletal: no cyanosis of digits and no clubbing  PSYCH: alert & oriented x 3 with fluent speech NEURO: no focal motor/sensory deficits  LABORATORY DATA:   I have reviewed the data as listed  . CBC Latest Ref Rng 07/14/2015 04/11/2015 01/02/2015  WBC 3.9 - 10.3 10e3/uL 10.8(H) 14.1(H) 13.1(H)  Hemoglobin 11.6 - 15.9 g/dL 10.7(L) 10.9(L) 11.2(L)  Hematocrit 34.8 - 46.6 % 31.9(L) 31.6(L) 33.5(L)  Platelets 145 - 400 10e3/uL 279 269 252   . CBC    Component Value Date/Time   WBC 10.8* 07/14/2015 1127   WBC 13.1* 01/02/2015 1355   RBC 3.51* 07/14/2015 1127   RBC 3.69* 01/02/2015 1355   HGB 10.7* 07/14/2015 1127   HGB 11.2* 01/02/2015 1355   HCT 31.9* 07/14/2015 1127   HCT 33.5* 01/02/2015 1355   PLT 279 07/14/2015 1127   PLT 252 01/02/2015 1355   MCV 90.9 07/14/2015 1127   MCV 90.8 01/02/2015 1355   MCH 30.5 07/14/2015 1127   MCH 30.4 01/02/2015 1355   MCHC 33.5 07/14/2015 1127   MCHC 33.4 01/02/2015 1355   RDW 13.2 07/14/2015 1127   RDW 12.7 01/02/2015 1355   LYMPHSABS 5.1* 07/14/2015 1127   LYMPHSABS 7.1* 12/01/2014 2054   MONOABS 0.9 07/14/2015 1127   MONOABS 0.9 12/01/2014 2054   EOSABS 0.3 07/14/2015 1127   EOSABS 0.5 12/01/2014 2054   BASOSABS 0.0 07/14/2015 1127   BASOSABS 0.0 12/01/2014 2054     . CMP Latest Ref Rng 07/14/2015 04/11/2015 01/02/2015  Glucose 70 - 140 mg/dl 100 94 106(H)  BUN 7.0 - 26.0 mg/dL 22.0 24.7 20  Creatinine 0.6 - 1.1 mg/dL 1.0 0.9 1.00  Sodium 136 - 145  mEq/L 135(L) 132(L) 139  Potassium 3.5 - 5.1 mEq/L 4.5 4.2 4.6  Chloride 101 - 111 mmol/L - - 106  CO2 22 - 29 mEq/L 22 23 26   Calcium 8.4 - 10.4 mg/dL 9.5 9.5 9.8  Total Protein 6.4 - 8.3 g/dL 7.0 7.1 -  Total Bilirubin 0.20 - 1.20 mg/dL 0.36 0.40 -  Alkaline Phos 40 - 150 U/L 84 88 -  AST 5 - 34 U/L 23 25 -  ALT  0 - 55 U/L 17 20 -     RADIOGRAPHIC STUDIES: I have personally reviewed the radiological images as listed and agreed with the findings in the report. No results found.  ASSESSMENT & PLAN:   82 year old female with multiple medical comorbidities as noted above.  1) CLL. Rai stage 0 - no discernible lymphadenopathy, splenomegaly. Has mild anemia which remains stable but this seems to be less likely from CLL. Given her pelgeroid neutrophils and acanthocytes cannot rule out some element of age-related myelodysplasia. No thrombocytopenia noted. Patient has no overt constitutional symptoms such as fevers/chills/drenching night sweats/weight loss more than 10% of body weight. WBC counts today are down from 14.1 k to 10.8k. And lymphocyte counts abdomen from 7.1k to 5.1k Plan. -No indication for treatment of her CLL at this time. -We will send out a CLL fish panel today. -Continue follow-up with primary care physician  Return to care in 6 months with repeat CBC, CMP. Earlier follow-up if any new concerns or questions.  I spent 15 minutes counseling the patient face to face. The total time spent in the appointment was 20 minutes and more than 50% was on counseling and direct patient cares.    Sullivan Lone MD [place] AAHIVMS [facility] Hematology/Oncology Physician [facility]  (Office):       [phone] (Work cell):  [phone] (Fax):           [phone]

## 2015-07-21 ENCOUNTER — Other Ambulatory Visit: Payer: Self-pay | Admitting: *Deleted

## 2015-07-21 LAB — FISH,CLL PROGNOSTIC PANEL

## 2015-07-28 ENCOUNTER — Telehealth: Payer: Self-pay | Admitting: *Deleted

## 2015-07-28 NOTE — Telephone Encounter (Signed)
Daughter,Jill left message requesting lab results from ~ 2 weeks ago. Can be reached at [phone]

## 2015-07-30 NOTE — Telephone Encounter (Signed)
Hello Loren/Donna Burch,  Could you plz call Donna Burch/her daughter and let them know the results of her labs. Her WBC is stable with her CLL and has not increased. Her CLL FISH study showed an isolated 13q deletion that typically portends an indolent course and is considered a favorable prognosis finding. Thanks, GK

## 2015-08-01 ENCOUNTER — Telehealth: Payer: Self-pay | Admitting: *Deleted

## 2015-08-01 NOTE — Telephone Encounter (Signed)
vm message received @ 1:33 pm from pt's daughter requesting results of lab work from 07/14/15.  TC back to daughter and reviewed lab work with her and reviewed her mother's future appts.  No other issues voiced.

## 2015-08-09 DIAGNOSIS — I129 Hypertensive chronic kidney disease with stage 1 through stage 4 chronic kidney disease, or unspecified chronic kidney disease: Secondary | ICD-10-CM | POA: Diagnosis not present

## 2015-08-09 DIAGNOSIS — E039 Hypothyroidism, unspecified: Secondary | ICD-10-CM | POA: Diagnosis not present

## 2015-08-09 DIAGNOSIS — E785 Hyperlipidemia, unspecified: Secondary | ICD-10-CM | POA: Diagnosis not present

## 2015-08-09 DIAGNOSIS — Z8639 Personal history of other endocrine, nutritional and metabolic disease: Secondary | ICD-10-CM | POA: Diagnosis not present

## 2015-08-14 ENCOUNTER — Telehealth: Payer: Self-pay | Admitting: *Deleted

## 2015-08-14 NOTE — Telephone Encounter (Signed)
Left voicemail for Sharee Pimple (daughter).

## 2015-08-14 NOTE — Telephone Encounter (Signed)
-----   Message from Brunetta Genera, MD sent at 07/19/2015  1:04 PM EST ----- Duwayne Heck, could you please call the patient and let her know that her WBC counts with her CLL were stable and in fact better than last time. No treatment needed at this time. We'll follow up in 6 months as per plan.  Sullivan Lone MD MS

## 2015-08-14 NOTE — Telephone Encounter (Signed)
Called patient with no answer/busy. Called daughter, Sharee Pimple and left a voicemail with results. Gave daughter a call back number for inquires.

## 2015-08-15 DIAGNOSIS — J40 Bronchitis, not specified as acute or chronic: Secondary | ICD-10-CM | POA: Diagnosis not present

## 2015-08-31 DIAGNOSIS — R531 Weakness: Secondary | ICD-10-CM | POA: Diagnosis not present

## 2015-08-31 DIAGNOSIS — E890 Postprocedural hypothyroidism: Secondary | ICD-10-CM | POA: Diagnosis not present

## 2015-08-31 DIAGNOSIS — M549 Dorsalgia, unspecified: Secondary | ICD-10-CM | POA: Diagnosis not present

## 2015-08-31 DIAGNOSIS — N390 Urinary tract infection, site not specified: Secondary | ICD-10-CM | POA: Diagnosis not present

## 2015-09-01 ENCOUNTER — Emergency Department (HOSPITAL_COMMUNITY): Payer: Medicare Other

## 2015-09-01 ENCOUNTER — Encounter (HOSPITAL_COMMUNITY): Payer: Self-pay | Admitting: Vascular Surgery

## 2015-09-01 ENCOUNTER — Inpatient Hospital Stay (HOSPITAL_COMMUNITY)
Admission: EM | Admit: 2015-09-01 | Discharge: 2015-09-06 | DRG: 312 | Disposition: A | Discharge status: Home Health | Payer: Medicare Other | Attending: Internal Medicine | Admitting: Internal Medicine

## 2015-09-01 DIAGNOSIS — I440 Atrioventricular block, first degree: Secondary | ICD-10-CM | POA: Diagnosis present

## 2015-09-01 DIAGNOSIS — I951 Orthostatic hypotension: Secondary | ICD-10-CM

## 2015-09-01 DIAGNOSIS — E876 Hypokalemia: Secondary | ICD-10-CM | POA: Diagnosis present

## 2015-09-01 DIAGNOSIS — K219 Gastro-esophageal reflux disease without esophagitis: Secondary | ICD-10-CM | POA: Diagnosis not present

## 2015-09-01 DIAGNOSIS — I498 Other specified cardiac arrhythmias: Secondary | ICD-10-CM | POA: Diagnosis present

## 2015-09-01 DIAGNOSIS — Z23 Encounter for immunization: Secondary | ICD-10-CM

## 2015-09-01 DIAGNOSIS — E871 Hypo-osmolality and hyponatremia: Secondary | ICD-10-CM

## 2015-09-01 DIAGNOSIS — R748 Abnormal levels of other serum enzymes: Secondary | ICD-10-CM

## 2015-09-01 DIAGNOSIS — R55 Syncope and collapse: Secondary | ICD-10-CM | POA: Diagnosis not present

## 2015-09-01 DIAGNOSIS — E860 Dehydration: Secondary | ICD-10-CM | POA: Diagnosis present

## 2015-09-01 DIAGNOSIS — Z87442 Personal history of urinary calculi: Secondary | ICD-10-CM

## 2015-09-01 DIAGNOSIS — R112 Nausea with vomiting, unspecified: Secondary | ICD-10-CM | POA: Diagnosis present

## 2015-09-01 DIAGNOSIS — J40 Bronchitis, not specified as acute or chronic: Secondary | ICD-10-CM | POA: Diagnosis present

## 2015-09-01 DIAGNOSIS — N390 Urinary tract infection, site not specified: Secondary | ICD-10-CM | POA: Diagnosis present

## 2015-09-01 DIAGNOSIS — R0602 Shortness of breath: Secondary | ICD-10-CM

## 2015-09-01 DIAGNOSIS — E785 Hyperlipidemia, unspecified: Secondary | ICD-10-CM | POA: Diagnosis present

## 2015-09-01 DIAGNOSIS — R05 Cough: Secondary | ICD-10-CM | POA: Diagnosis not present

## 2015-09-01 DIAGNOSIS — Z8619 Personal history of other infectious and parasitic diseases: Secondary | ICD-10-CM | POA: Diagnosis not present

## 2015-09-01 DIAGNOSIS — I10 Essential (primary) hypertension: Secondary | ICD-10-CM | POA: Diagnosis not present

## 2015-09-01 DIAGNOSIS — R531 Weakness: Secondary | ICD-10-CM | POA: Diagnosis not present

## 2015-09-01 DIAGNOSIS — Z7982 Long term (current) use of aspirin: Secondary | ICD-10-CM

## 2015-09-01 DIAGNOSIS — C9110 Chronic lymphocytic leukemia of B-cell type not having achieved remission: Secondary | ICD-10-CM

## 2015-09-01 DIAGNOSIS — I4949 Other premature depolarization: Secondary | ICD-10-CM

## 2015-09-01 DIAGNOSIS — R059 Cough, unspecified: Secondary | ICD-10-CM | POA: Diagnosis present

## 2015-09-01 DIAGNOSIS — E872 Acidosis: Secondary | ICD-10-CM | POA: Diagnosis present

## 2015-09-01 DIAGNOSIS — Z888 Allergy status to other drugs, medicaments and biological substances status: Secondary | ICD-10-CM

## 2015-09-01 DIAGNOSIS — R001 Bradycardia, unspecified: Secondary | ICD-10-CM | POA: Diagnosis not present

## 2015-09-01 DIAGNOSIS — D649 Anemia, unspecified: Secondary | ICD-10-CM | POA: Diagnosis present

## 2015-09-01 DIAGNOSIS — K8020 Calculus of gallbladder without cholecystitis without obstruction: Secondary | ICD-10-CM | POA: Diagnosis present

## 2015-09-01 DIAGNOSIS — E039 Hypothyroidism, unspecified: Secondary | ICD-10-CM | POA: Diagnosis not present

## 2015-09-01 DIAGNOSIS — I492 Junctional premature depolarization: Secondary | ICD-10-CM

## 2015-09-01 HISTORY — DX: Chronic lymphocytic leukemia of B-cell type not having achieved remission: C91.10

## 2015-09-01 LAB — BASIC METABOLIC PANEL
Anion gap: 13 (ref 5–15)
BUN: 24 mg/dL — AB (ref 6–20)
CHLORIDE: 92 mmol/L — AB (ref 101–111)
CO2: 18 mmol/L — ABNORMAL LOW (ref 22–32)
CREATININE: 0.98 mg/dL (ref 0.44–1.00)
Calcium: 9.4 mg/dL (ref 8.9–10.3)
GFR calc Af Amer: 59 mL/min — ABNORMAL LOW (ref >60)
GFR calc non Af Amer: 51 mL/min — ABNORMAL LOW (ref >60)
Glucose, Bld: 142 mg/dL — ABNORMAL HIGH (ref 65–99)
Potassium: 3.8 mmol/L (ref 3.5–5.1)
SODIUM: 123 mmol/L — AB (ref 135–145)

## 2015-09-01 LAB — URINALYSIS, ROUTINE W REFLEX MICROSCOPIC
BILIRUBIN URINE: NEGATIVE
GLUCOSE, UA: NEGATIVE mg/dL
HGB URINE DIPSTICK: NEGATIVE
KETONES UR: NEGATIVE mg/dL
Nitrite: NEGATIVE
PROTEIN: NEGATIVE mg/dL
Specific Gravity, Urine: 1.011 (ref 1.005–1.030)
pH: 7 (ref 5.0–8.0)

## 2015-09-01 LAB — CBC
HCT: 31.6 % — ABNORMAL LOW (ref 36.0–46.0)
Hemoglobin: 10.9 g/dL — ABNORMAL LOW (ref 12.0–15.0)
MCH: 30.1 pg (ref 26.0–34.0)
MCHC: 34.5 g/dL (ref 30.0–36.0)
MCV: 87.3 fL (ref 78.0–100.0)
PLATELETS: 274 10*3/uL (ref 150–400)
RBC: 3.62 MIL/uL — ABNORMAL LOW (ref 3.87–5.11)
RDW: 12.9 % (ref 11.5–15.5)
WBC: 11.7 10*3/uL — ABNORMAL HIGH (ref 4.0–10.5)

## 2015-09-01 LAB — URINE MICROSCOPIC-ADD ON

## 2015-09-01 LAB — CBG MONITORING, ED: Glucose-Capillary: 89 mg/dL (ref 65–99)

## 2015-09-01 LAB — HEPATIC FUNCTION PANEL
ALK PHOS: 76 U/L (ref 38–126)
ALT: 23 U/L (ref 14–54)
AST: 34 U/L (ref 15–41)
Albumin: 3.1 g/dL — ABNORMAL LOW (ref 3.5–5.0)
Total Bilirubin: 0.4 mg/dL (ref 0.3–1.2)
Total Protein: 6.8 g/dL (ref 6.5–8.1)

## 2015-09-01 LAB — MAGNESIUM: Magnesium: 1.4 mg/dL — ABNORMAL LOW (ref 1.7–2.4)

## 2015-09-01 LAB — TROPONIN I: Troponin I: <0.03 ng/mL (ref <0.031)

## 2015-09-01 LAB — SODIUM, URINE, RANDOM: SODIUM UR: 69 mmol/L

## 2015-09-01 LAB — BRAIN NATRIURETIC PEPTIDE: B Natriuretic Peptide: 46.2 pg/mL (ref 0.0–100.0)

## 2015-09-01 MED ORDER — VITAMIN D 1000 UNITS PO TABS
2000.0000 [IU] | ORAL_TABLET | Freq: Two times a day (BID) | ORAL | Status: DC
  Administered 2015-09-01 – 2015-09-06 (×10): 2000 [IU] via ORAL
  Filled 2015-09-01 (×10): qty 2

## 2015-09-01 MED ORDER — PANTOPRAZOLE SODIUM 40 MG PO TBEC
40.0000 mg | DELAYED_RELEASE_TABLET | Freq: Every day | ORAL | Status: DC
  Administered 2015-09-02 – 2015-09-06 (×5): 40 mg via ORAL
  Filled 2015-09-01 (×5): qty 1

## 2015-09-01 MED ORDER — SODIUM CHLORIDE 0.9 % IV SOLN
Freq: Once | INTRAVENOUS | Status: AC
  Administered 2015-09-01: 19:00:00 via INTRAVENOUS

## 2015-09-01 MED ORDER — IRBESARTAN 150 MG PO TABS
150.0000 mg | ORAL_TABLET | Freq: Every day | ORAL | Status: DC
  Administered 2015-09-01 – 2015-09-02 (×2): 150 mg via ORAL
  Filled 2015-09-01 (×2): qty 1

## 2015-09-01 MED ORDER — ACETAMINOPHEN 325 MG PO TABS: 650.0000 mg | ORAL_TABLET | Freq: Four times a day (QID) | ORAL | Status: DC | PRN

## 2015-09-01 MED ORDER — DM-GUAIFENESIN ER 30-600 MG PO TB12
1.0000 | ORAL_TABLET | Freq: Two times a day (BID) | ORAL | Status: DC
  Administered 2015-09-01 – 2015-09-06 (×10): 1 via ORAL
  Filled 2015-09-01 (×10): qty 1

## 2015-09-01 MED ORDER — ALBUTEROL SULFATE (2.5 MG/3ML) 0.083% IN NEBU
2.5000 mg | INHALATION_SOLUTION | RESPIRATORY_TRACT | Status: DC | PRN
  Administered 2015-09-03: 2.5 mg via RESPIRATORY_TRACT
  Filled 2015-09-01: qty 3

## 2015-09-01 MED ORDER — HYDROCODONE-ACETAMINOPHEN 5-325 MG PO TABS: 1.0000 | ORAL_TABLET | ORAL | Status: DC | PRN

## 2015-09-01 MED ORDER — COLON FORMULA 166.67-500 MG PO CAPS: 1.0000 | ORAL_CAPSULE | Freq: Every day | ORAL | Status: DC

## 2015-09-01 MED ORDER — ENOXAPARIN SODIUM 40 MG/0.4ML ~~LOC~~ SOLN
40.0000 mg | Freq: Every day | SUBCUTANEOUS | Status: DC
  Administered 2015-09-01 – 2015-09-05 (×5): 40 mg via SUBCUTANEOUS
  Filled 2015-09-01 (×5): qty 0.4

## 2015-09-01 MED ORDER — ZOLPIDEM TARTRATE 5 MG PO TABS: 5.0000 mg | ORAL_TABLET | Freq: Every evening | ORAL | Status: DC | PRN

## 2015-09-01 MED ORDER — SODIUM CHLORIDE 0.9 % IV BOLUS (SEPSIS)
1500.0000 mL | Freq: Once | INTRAVENOUS | Status: AC
  Administered 2015-09-01: 1500 mL via INTRAVENOUS

## 2015-09-01 MED ORDER — MAGNESIUM CHLORIDE 64 MG PO TBEC
1.0000 | DELAYED_RELEASE_TABLET | Freq: Two times a day (BID) | ORAL | Status: DC
  Administered 2015-09-01 – 2015-09-06 (×10): 64 mg via ORAL
  Filled 2015-09-01 (×14): qty 1

## 2015-09-01 MED ORDER — ONDANSETRON HCL 4 MG PO TABS: 4.0000 mg | ORAL_TABLET | Freq: Four times a day (QID) | ORAL | Status: DC | PRN

## 2015-09-01 MED ORDER — SODIUM CHLORIDE 0.9 % IV SOLN
INTRAVENOUS | Status: AC
  Administered 2015-09-01: 21:00:00 via INTRAVENOUS

## 2015-09-01 MED ORDER — LEVOTHYROXINE SODIUM 50 MCG PO TABS
50.0000 ug | ORAL_TABLET | Freq: Every day | ORAL | Status: DC
  Administered 2015-09-02: 50 ug via ORAL
  Filled 2015-09-01: qty 1

## 2015-09-01 MED ORDER — AMLODIPINE BESYLATE 5 MG PO TABS
5.0000 mg | ORAL_TABLET | Freq: Every day | ORAL | Status: DC
  Administered 2015-09-02: 5 mg via ORAL
  Filled 2015-09-01: qty 1

## 2015-09-01 MED ORDER — DEXTROSE 5 % IV SOLN
1.0000 g | INTRAVENOUS | Status: DC
  Administered 2015-09-01 – 2015-09-02 (×2): 1 g via INTRAVENOUS
  Filled 2015-09-01 (×3): qty 10

## 2015-09-01 MED ORDER — ONDANSETRON HCL 4 MG/2ML IJ SOLN: 4.0000 mg | Freq: Four times a day (QID) | INTRAMUSCULAR | Status: DC | PRN

## 2015-09-01 MED ORDER — ASPIRIN EC 81 MG PO TBEC
81.0000 mg | DELAYED_RELEASE_TABLET | Freq: Every day | ORAL | Status: DC
  Administered 2015-09-02 – 2015-09-06 (×5): 81 mg via ORAL
  Filled 2015-09-01 (×5): qty 1

## 2015-09-01 MED ORDER — ATORVASTATIN CALCIUM 80 MG PO TABS
80.0000 mg | ORAL_TABLET | Freq: Every day | ORAL | Status: DC
  Administered 2015-09-02 – 2015-09-05 (×4): 80 mg via ORAL
  Filled 2015-09-01 (×4): qty 1

## 2015-09-01 MED ORDER — ACETAMINOPHEN 650 MG RE SUPP: 650.0000 mg | Freq: Four times a day (QID) | RECTAL | Status: DC | PRN

## 2015-09-01 NOTE — ED Provider Notes (Signed)
CSN: [id number]     Arrival date & time 09/01/15  1350 History   First MD Initiated Contact with Patient 09/01/15 1722     Chief Complaint  Patient presents with  . Loss of Consciousness     (Consider location/radiation/quality/duration/timing/severity/associated sxs/prior Treatment) HPI Comments: Patient from home with generalized weakness for the past 3 days worsening since Sunday night. Had one episode of vomiting yesterday with syncopal episode while she was sitting on the couch. She did not fall or hit her head. Her family found her slumped over with vomit on her shirt. She was initially confused after with this. She saw her PCP 2 days ago and was placed on antibiotics for urine infection. Denies any fever but has had chills. She was told she had low sodium and elevated kidney function. Denies any chest pain or shortness of breath. Point to some left flank pain and pain with urination. Denies any current dizziness but has lightheadedness with standing and generalized weakness. No fever. No focal weakness, numbness or tingling. No bowel or bladder incontinence.  Patient is a 82 y.o. female presenting with syncope. The history is provided by the patient and a relative.  Loss of Consciousness Associated symptoms: dizziness, nausea, vomiting and weakness   Associated symptoms: no chest pain, no fever, no headaches and no shortness of breath     Past Medical History  Diagnosis Date  . Elevated brain natriuretic peptide (BNP) level     Slight  . Hiatal hernia   . Hypertension   . Hyperlipidemia   . Nocturia     x3  . Gastroenteritis 09/2010    c. dificile.  hospitalized in ICU x 3 days  . Dyspnea on exertion     Improved with exercise  . Renal insufficiency   . Nephrolithiasis   . GERD (gastroesophageal reflux disease)   . History of Clostridium difficile ?03/2012    "after taking ATB for one of my UTI's"  . Wears glasses   . Seasonal allergies   . Skin cancer     right anterior  neck  . PONV (postoperative nausea and vomiting)   . Family history of adverse reaction to anesthesia     son, Marita Snellen, w/PONV  . Pneumonia     Required hospital stay at the time  . Hypothyroidism   . Frequent UTI     "used to; none lately" (01/03/2015)  . Arthritis     "right middle finger; hands" (01/03/2015)  . CLL (chronic lymphocytic leukemia) ([place])    Past Surgical History  Procedure Laterality Date  . Cataract extraction, bilateral  2010  . Wisdom tooth extraction    . Lithotripsy      "didn't work"  . Esophagogastroduodenoscopy    . Esophagogastroduodenoscopy (egd) with esophageal dilation    . Colonoscopy    . Knee arthroscopy Left   . Thyroid lobectomy Right 01/03/2015  . Cystoscopy with stent placement    . Cystoscopy w/ stone manipulation Right   . Tonsillectomy    . Skin cancer excision Right     anterior neck  . Thyroid lobectomy Right 01/03/2015    Procedure: RIGHT THYROID LOBECTOMY;  Surgeon: Armandina Gemma, MD;  Location: [facility];  Service: General;  Laterality: Right;   Family History  Problem Relation Age of Onset  . Heart attack Mother   . Hyperlipidemia Mother   . Asthma Father   . Diabetes Father    Social History  Substance Use Topics  . Smoking status: Never  Smoker   . Smokeless tobacco: Never Used  . Alcohol Use: No   OB History    Gravida Para Term Preterm AB TAB SAB Ectopic Multiple Living   3 3        3      Review of Systems  Constitutional: Positive for activity change, appetite change and fatigue. Negative for fever.  HENT: Negative for congestion and mouth sores.   Respiratory: Negative for cough, chest tightness and shortness of breath.   Cardiovascular: Positive for syncope. Negative for chest pain and leg swelling.  Gastrointestinal: Positive for nausea and vomiting. Negative for abdominal pain.  Genitourinary: Negative for dysuria, hematuria, vaginal bleeding and vaginal discharge.  Musculoskeletal: Positive for myalgias and arthralgias.   Neurological: Positive for dizziness, weakness and light-headedness. Negative for headaches.    A complete 10 system review of systems was obtained and all systems are negative except as noted in the HPI and PMH.    Allergies  Prednisone and Reclast  Home Medications   Prior to Admission medications   Medication Sig Start Date End Date Taking? Authorizing Provider  amLODipine (NORVASC) 5 MG tablet Take 5 mg by mouth daily.   Yes Historical Provider, MD  aspirin 81 MG tablet Take 81 mg by mouth daily.   Yes Historical Provider, MD  atorvastatin (LIPITOR) 80 MG tablet Take 80 mg by mouth daily at 6 PM.    Yes Historical Provider, MD  cholecalciferol (VITAMIN D) 1000 UNITS tablet Take 2,000 Units by mouth 2 (two) times daily.    Yes Historical Provider, MD  ciprofloxacin (CIPRO) 250 MG tablet Take 250 mg by mouth 2 (two) times daily. Started on 08-31-15   Yes Historical Provider, MD  levothyroxine (SYNTHROID, LEVOTHROID) 50 MCG tablet  03/21/15  Yes Historical Provider, MD  magnesium chloride (SLOW-MAG) 64 MG TBEC SR tablet Take 1 tablet by mouth 2 (two) times daily.    Yes Historical Provider, MD  Nutritional Supplements (COLON FORMULA PO) Take 1 capsule by mouth daily.    Yes Historical Provider, MD  omeprazole (PRILOSEC) 20 MG capsule Take 20 mg by mouth daily.   Yes Historical Provider, MD  valsartan-hydrochlorothiazide (DIOVAN-HCT) 160-12.5 MG per tablet Take 1 tablet by mouth daily.   Yes Historical Provider, MD  HYDROcodone-acetaminophen (NORCO/VICODIN) 5-325 MG per tablet Take 1-2 tablets by mouth every 4 (four) hours as needed for moderate pain. Patient not taking: Reported on 09/01/2015 01/04/15   Armandina Gemma, MD   BP 136/58 mmHg  Pulse 93  Temp(Src) 97.5 F (36.4 C) (Oral)  Resp 21  SpO2 96% Physical Exam  Constitutional: She is oriented to person, place, and time. She appears well-developed and well-nourished. No distress.  Ill appearing, dry mucus membranes  HENT:  Head:  Normocephalic and atraumatic.  Mouth/Throat: Oropharynx is clear and moist. No oropharyngeal exudate.  Eyes: Conjunctivae and EOM are normal. Pupils are equal, round, and reactive to light.  Neck: Normal range of motion. Neck supple.  No meningismus.  Cardiovascular: Normal rate, regular rhythm, normal heart sounds and intact distal pulses.   No murmur heard. Pulmonary/Chest: Effort normal and breath sounds normal. No respiratory distress. She exhibits no tenderness.  Abdominal: Soft. There is no tenderness. There is no rebound and no guarding.  Musculoskeletal: Normal range of motion. She exhibits tenderness. She exhibits no edema.  L CVAT  Neurological: She is alert and oriented to person, place, and time. No cranial nerve deficit. She exhibits normal muscle tone. Coordination normal.  No ataxia on  finger to nose bilaterally. No pronator drift. 4/5 strength throughout. CN 2-12 intact.Equal grip strength. Sensation intact.   Skin: Skin is warm.  Psychiatric: She has a normal mood and affect. Her behavior is normal.  Nursing note and vitals reviewed.   ED Course  Procedures (including critical care time) Labs Review Labs Reviewed  BASIC METABOLIC PANEL - Abnormal; Notable for the following:    Sodium 123 (*)    Chloride 92 (*)    CO2 18 (*)    Glucose, Bld 142 (*)    BUN 24 (*)    GFR calc non Af Amer 51 (*)    GFR calc Af Amer 59 (*)    All other components within normal limits  CBC - Abnormal; Notable for the following:    WBC 11.7 (*)    RBC 3.62 (*)    Hemoglobin 10.9 (*)    HCT 31.6 (*)    All other components within normal limits  URINALYSIS, ROUTINE W REFLEX MICROSCOPIC (NOT AT [facility]) - Abnormal; Notable for the following:    Leukocytes, UA MODERATE (*)    All other components within normal limits  HEPATIC FUNCTION PANEL - Abnormal; Notable for the following:    Albumin 3.1 (*)    Bilirubin, Direct <0.1 (*)    All other components within normal limits  MAGNESIUM -  Abnormal; Notable for the following:    Magnesium 1.4 (*)    All other components within normal limits  URINE MICROSCOPIC-ADD ON - Abnormal; Notable for the following:    Squamous Epithelial / LPF 0-5 (*)    Bacteria, UA RARE (*)    All other components within normal limits  URINE CULTURE  RESPIRATORY VIRUS PANEL  CULTURE, EXPECTORATED SPUTUM-ASSESSMENT  BRAIN NATRIURETIC PEPTIDE  TROPONIN I  SODIUM, URINE, RANDOM  TSH  LIPID PANEL  OSMOLALITY  OSMOLALITY, URINE  INFLUENZA PANEL BY PCR (TYPE A & B, H1N1)  LIPASE, BLOOD  LACTIC ACID, PLASMA  LACTIC ACID, PLASMA  PROCALCITONIN  PROTIME-INR  APTT  CBG MONITORING, ED    Imaging Review Dg Chest 2 View  09/01/2015  CLINICAL DATA:  Weakness EXAM: CHEST  2 VIEW COMPARISON:  12/01/2014 FINDINGS: Heart size within normal limits. Negative for heart failure or edema. Negative for infiltrate effusion or mass. Lungs remain clear Moderately large hiatal hernia Interval right thyroid surgery. IMPRESSION: No active cardiopulmonary disease. Electronically Signed   By: Franchot Gallo M.D.   On: 09/01/2015 18:16   Ct Head Wo Contrast  09/01/2015  CLINICAL DATA:  82 year old female with several syncopal episodes for the past 5 days, currently being treated for urinary tract infection. EXAM: CT HEAD WITHOUT CONTRAST TECHNIQUE: Contiguous axial images were obtained from the base of the skull through the vertex without intravenous contrast. COMPARISON:  No priors. FINDINGS: Mild cerebral atrophy. Patchy and confluent areas of decreased attenuation are noted throughout the deep and periventricular white matter of the cerebral hemispheres bilaterally, compatible with chronic microvascular ischemic disease. No acute intracranial abnormalities. Specifically, no evidence of acute intracranial hemorrhage, no definite findings of acute/subacute cerebral ischemia, no mass, mass effect, hydrocephalus or abnormal intra or extra-axial fluid collections. Visualized  paranasal sinuses and mastoids are well pneumatized. No acute displaced skull fractures are identified. IMPRESSION: 1. No acute intracranial abnormalities. 2. Mild cerebral atrophy with chronic microvascular ischemic changes in cerebral white matter, as above. Electronically Signed   By: Vinnie Langton M.D.   On: 09/01/2015 18:49   I have personally reviewed and evaluated  these images and lab results as part of my medical decision-making.   EKG Interpretation   Date/Time:  Friday September 01 2015 14:58:38 EST Ventricular Rate:  85 PR Interval:  184 QRS Duration: 84 QT Interval:  354 QTC Calculation: 421 R Axis:   78 Text Interpretation:  * Poor data quality, interpretation may be  adversely affected Normal sinus rhythm Normal ECG No significant change  was found Confirmed by [place] [phone]) on 09/01/2015 4:52:05  PM      MDM   Final diagnoses:  Hyponatremia  Generalized weakness with recent diagnosis of UTI. Decreased by mouth intake at home with one episode of vomiting and near syncopal episode.  Awake and alert. Dry mucous membranes. EKG is unchanged. Nonfocal neurological exam.  Labs show hyponatremia of 123 with normal creatinine. Urinalysis positive for infection. Metabolic acidosis with bicarbonate of 18.  Gentle IV hydration started. Treat UTI. Suspect syncope due to orthostasis and UTI. No chest pain or shortness of breath.  Admission for hydration and correction of hyponatremia discussed with Dr. Blaine Hamper.    Ezequiel Essex, MD 09/01/15 [id number]

## 2015-09-01 NOTE — ED Notes (Signed)
Pt placed on bedpan

## 2015-09-01 NOTE — ED Notes (Signed)
Charge RN called for pts daughter.

## 2015-09-01 NOTE — ED Notes (Signed)
Patient transported to CT 

## 2015-09-01 NOTE — ED Notes (Signed)
Pt visibly shaking. Pt alert while this is happening.

## 2015-09-01 NOTE — ED Notes (Signed)
Pt reports to the ED for eval of several episodes of syncope since Sunday night. She also reports an episode of N/V. Pt was seen by PCP and had some blood work done and was found to have a low sodium and altered kidney function. Pt denies any CP or SOB. Pt A&Ox4, resp e/u, and skin warm and dry.

## 2015-09-01 NOTE — H&P (Addendum)
[facility] History and Physical  Donna Burch [id number] DOB: 05-16-1927 DOA: 09/01/2015  Referring physician: ED physician PCP: Vidal Schwalbe, MD  Specialists:   Chief Complaint: Cough, generalized weakness, nausea, vomiting, increased urinary frequency, syncope, hyponatremia  HPI: Donna Burch is a 82 y.o. female with PMH of CLL, hypertension, hyperlipidemia, GERD, hypothyroidism, kidney stone, C. difficile colitis, skin cancer, recurrent UTI, arthritis, who presents with cough, generalized weakness, nausea, vomiting, increased urinary frequency, syncope, hyponatremia.  Patient reports that she has been having coughing in the past 3 weeks. Initially he coughed up yellow color sputum, which becomes clear in the past several days. Patient does not have chest pain, shortness of breath. No runny nose or sore throat.  She has generalized weakness since Sunday. She passed out for a few seconds yesterday per her daughter. She also has increased urinary frequency, but no dysuria or burning on urination. She was seen by her PCP 2 days ago, and diagnosed as UTI after urinalysis test. She was started with ciprofloxacin. Patient was called by the PCP today, telling her that her sodium is low, and advised her to come to the hospital for further eval and treatment. Patient reports that she has nausea and vomited once yesterday. No abdominal pain or diarrhea. Patient does not have fever, chills, chest pain. Patient does not have unilateral weakness, numbness or tingling sensations.  In ED, patient was found to have sodium of 123, bicarbonate 18, creatinine normal. Troponin negative, BNP 46.2, WBC 11.7, temperature normal, no tachycardia, negative chest x-ray, negative CT-head negative for acute intracranial abnormalities. Patient is admitted to inpatient for further eval and treatment.   EKG: Independently reviewed. QTC 421, no ischemic change.  Where does patient live?   At home Can  patient participate in ADLs?  Yes    Review of Systems:   General: no fevers, chills, no changes in body weight, has poor appetite, has fatigue HEENT: no blurry vision, hearing changes or sore throat Pulm: no dyspnea, has coughing, no wheezing CV: no chest pain, no palpitations Abd: has nausea, vomiting, no abdominal pain, diarrhea, constipation GU: no dysuria, burning on urination, has increased urinary frequency, no hematuria  Ext: no leg edema Neuro: no unilateral weakness, numbness, or tingling, no vision change or hearing loss Skin: no rash MSK: No muscle spasm, no deformity, no limitation of range of movement in spin Heme: No easy bruising.  Travel history: No recent long distant travel.  Allergy:  Allergies  Allergen Reactions  . Prednisone Other (See Comments)    "Jittery"  . Reclast [Zoledronic Acid] Other (See Comments)    Pt did not have a memory, from the day after she took that medication     Past Medical History  Diagnosis Date  . Elevated brain natriuretic peptide (BNP) level     Slight  . Hiatal hernia   . Hypertension   . Hyperlipidemia   . Nocturia     x3  . Gastroenteritis 09/2010    c. dificile.  hospitalized in ICU x 3 days  . Dyspnea on exertion     Improved with exercise  . Renal insufficiency   . Nephrolithiasis   . GERD (gastroesophageal reflux disease)   . History of Clostridium difficile ?03/2012    "after taking ATB for one of my UTI's"  . Wears glasses   . Seasonal allergies   . Skin cancer     right anterior neck  . PONV (postoperative nausea and vomiting)   . Family  history of adverse reaction to anesthesia     son, Marita Snellen, w/PONV  . Pneumonia     Required hospital stay at the time  . Hypothyroidism   . Frequent UTI     "used to; none lately" (01/03/2015)  . Arthritis     "right middle finger; hands" (01/03/2015)  . CLL (chronic lymphocytic leukemia) ([place])     Past Surgical History  Procedure Laterality Date  . Cataract  extraction, bilateral  2010  . Wisdom tooth extraction    . Lithotripsy      "didn't work"  . Esophagogastroduodenoscopy    . Esophagogastroduodenoscopy (egd) with esophageal dilation    . Colonoscopy    . Knee arthroscopy Left   . Thyroid lobectomy Right 01/03/2015  . Cystoscopy with stent placement    . Cystoscopy w/ stone manipulation Right   . Tonsillectomy    . Skin cancer excision Right     anterior neck  . Thyroid lobectomy Right 01/03/2015    Procedure: RIGHT THYROID LOBECTOMY;  Surgeon: Armandina Gemma, MD;  Location: [facility];  Service: General;  Laterality: Right;    Social History:  reports that she has never smoked. She has never used smokeless tobacco. She reports that she does not drink alcohol or use illicit drugs.  Family History:  Family History  Problem Relation Age of Onset  . Heart attack Mother   . Hyperlipidemia Mother   . Asthma Father   . Diabetes Father      Prior to Admission medications   Medication Sig Start Date End Date Taking? Authorizing Provider  amLODipine (NORVASC) 5 MG tablet Take 5 mg by mouth daily.   Yes Historical Provider, MD  aspirin 81 MG tablet Take 81 mg by mouth daily.   Yes Historical Provider, MD  atorvastatin (LIPITOR) 80 MG tablet Take 80 mg by mouth daily at 6 PM.    Yes Historical Provider, MD  cholecalciferol (VITAMIN D) 1000 UNITS tablet Take 2,000 Units by mouth 2 (two) times daily.    Yes Historical Provider, MD  ciprofloxacin (CIPRO) 250 MG tablet Take 250 mg by mouth 2 (two) times daily. Started on 08-31-15   Yes Historical Provider, MD  levothyroxine (SYNTHROID, LEVOTHROID) 50 MCG tablet  03/21/15  Yes Historical Provider, MD  magnesium chloride (SLOW-MAG) 64 MG TBEC SR tablet Take 1 tablet by mouth 2 (two) times daily.    Yes Historical Provider, MD  Nutritional Supplements (COLON FORMULA PO) Take 1 capsule by mouth daily.    Yes Historical Provider, MD  omeprazole (PRILOSEC) 20 MG capsule Take 20 mg by mouth daily.   Yes  Historical Provider, MD  valsartan-hydrochlorothiazide (DIOVAN-HCT) 160-12.5 MG per tablet Take 1 tablet by mouth daily.   Yes Historical Provider, MD  HYDROcodone-acetaminophen (NORCO/VICODIN) 5-325 MG per tablet Take 1-2 tablets by mouth every 4 (four) hours as needed for moderate pain. Patient not taking: Reported on 09/01/2015 01/04/15   Armandina Gemma, MD    Physical Exam: Filed Vitals:   09/01/15 2125 09/01/15 2128 09/01/15 2145 09/01/15 2225  BP:   129/58 136/58  Pulse:   92 93  Temp: 97.9 F (36.6 C) 97.9 F (36.6 C)  97.5 F (36.4 C)  TempSrc: Rectal   Oral  Resp:   27 21  SpO2:   98% 96%   General: Not in acute distress. Dry mucus and membrane. HEENT:       Eyes: PERRL, EOMI, no scleral icterus.       ENT: No discharge  from the ears and nose, no pharynx injection, no tonsillar enlargement.        Neck: No JVD, no bruit, no mass felt. Heme: No neck lymph node enlargement. Cardiac: S1/S2, RRR, No murmurs, No gallops or rubs. Pulm: No rales, wheezing, rhonchi or rubs. Abd: Soft, nondistended, nontender, no rebound pain, no organomegaly, BS present. Ext: No pitting leg edema bilaterally. 2+DP/PT pulse bilaterally. Musculoskeletal: No joint deformities, No joint redness or warmth, no limitation of ROM in spin. Skin: No rashes.  Neuro: Alert, oriented X3, cranial nerves II-XII grossly intact, moves all extremities normally. Muscle strength 5/5 in all extremities, sensation to light touch intact. Knee reflex 1+ bilaterally. Negative Babinski's sign. Normal finger to nose test. Psych: Patient is not psychotic, no suicidal or hemocidal ideation.  Labs on Admission:  Basic Metabolic Panel:  Recent Labs Lab 09/01/15 1454 09/01/15 1758  NA 123*  --   K 3.8  --   CL 92*  --   CO2 18*  --   GLUCOSE 142*  --   BUN 24*  --   CREATININE 0.98  --   CALCIUM 9.4  --   MG  --  1.4*   Liver Function Tests:  Recent Labs Lab 09/01/15 1758  AST 34  ALT 23  ALKPHOS 76  BILITOT  0.4  PROT 6.8  ALBUMIN 3.1*   No results for input(s): LIPASE, AMYLASE in the last 168 hours. No results for input(s): AMMONIA in the last 168 hours. CBC:  Recent Labs Lab 09/01/15 1454  WBC 11.7*  HGB 10.9*  HCT 31.6*  MCV 87.3  PLT 274   Cardiac Enzymes:  Recent Labs Lab 09/01/15 1758  TROPONINI <0.03    BNP (last 3 results)  Recent Labs  09/01/15 1758  BNP 46.2    ProBNP (last 3 results) No results for input(s): PROBNP in the last 8760 hours.  CBG:  Recent Labs Lab 09/01/15 2057  GLUCAP 89    Radiological Exams on Admission: Dg Chest 2 View  09/01/2015  CLINICAL DATA:  Weakness EXAM: CHEST  2 VIEW COMPARISON:  12/01/2014 FINDINGS: Heart size within normal limits. Negative for heart failure or edema. Negative for infiltrate effusion or mass. Lungs remain clear Moderately large hiatal hernia Interval right thyroid surgery. IMPRESSION: No active cardiopulmonary disease. Electronically Signed   By: Franchot Gallo M.D.   On: 09/01/2015 18:16   Ct Head Wo Contrast  09/01/2015  CLINICAL DATA:  82 year old female with several syncopal episodes for the past 5 days, currently being treated for urinary tract infection. EXAM: CT HEAD WITHOUT CONTRAST TECHNIQUE: Contiguous axial images were obtained from the base of the skull through the vertex without intravenous contrast. COMPARISON:  No priors. FINDINGS: Mild cerebral atrophy. Patchy and confluent areas of decreased attenuation are noted throughout the deep and periventricular white matter of the cerebral hemispheres bilaterally, compatible with chronic microvascular ischemic disease. No acute intracranial abnormalities. Specifically, no evidence of acute intracranial hemorrhage, no definite findings of acute/subacute cerebral ischemia, no mass, mass effect, hydrocephalus or abnormal intra or extra-axial fluid collections. Visualized paranasal sinuses and mastoids are well pneumatized. No acute displaced skull fractures are  identified. IMPRESSION: 1. No acute intracranial abnormalities. 2. Mild cerebral atrophy with chronic microvascular ischemic changes in cerebral white matter, as above. Electronically Signed   By: Vinnie Langton M.D.   On: 09/01/2015 18:49    Assessment/Plan Principal Problem:   Syncope Active Problems:   Recurrent UTI (urinary tract infection)   CLL (chronic  lymphocytic leukemia) (HCC)   Hypertension   Hyperlipidemia   GERD (gastroesophageal reflux disease)   Hypothyroidism   Nausea & vomiting   Hyponatremia   Cough   Syncope: Etiology is not clear. Differential diagnosis include orthostatic status secondary to dehydration due to nausea vomiting, vasovagal syncope and UTI. Patient does not have any chest pain or palpitation, less likely to have cardiac etiology. No signs of unilateral weakness numbness or tenderness, less likely to have TIA or stroke.  -will admit to tele bed -check orthostatic vital signs -IV fluid: Normal saline bolus 1.5 L, followed by 100 mL per hour -PT/OT -Frequent neuro check  Recurrent UTI (urinary tract infection): she was diagnosed with UTI by PCP 2 days ago. She was given ciprofloxacin, still has symptoms. Patient is not septic on admission. Hemodynamically stable. Pt does not meet criteria for sepsis. Lactate is pending -switch to IV rocephin -f/u Bx and Ux. -will get Procalcitonin and trend lactic acid levels -IVF: 1.5L of NS bolus in ED, followed by 100 cc/h as above  Cough: Patient has been coughing for 3 weeks. Initially coughed up yellow color sputum, which become clear. Chest x-ray is negative. Patient does not have chest pain or shortness breath. Lungs clear on auscultation. Patient may have upper respiratory viral infection. -Mucinex of a cough -Check flu PCR and respiratory virus panel -sputum culture  CLL (chronic lymphocytic leukemia) [facility]): followed by hematologist dr. Sullivan Lone. Last seen was on 07/19/15. Per Dr.  Lupita Shutter note,  pt has CCL of Rai stage 0.  No indication for treatment of her CLL at this time. WBC=11.7 -f/u with hematology  Hyponatremia: Likely due to combination of decreased oral intake, GI los from vomiting and continuation HCTZ. -Switch that Diovan-HCTZ to Irbesartan -IVF as above -check plasma and urine osma  HLD: Last LDL was not on records -Continue home medications: Lipitor -Check FLP  Hypertension: -Switch that Diovan-HCTZ to Irbesartan as above -continue amlodipin  GERD: -Protonix  Nausea and vomiting: Unclear etiology. Patient does not have abdominal pain, likely due to UTI. -Check lipase -Treated UTI as above -When necessary Zofran for nausea  Hypothyroidism: Last TSH was 0.85 on 10/07/10 -Continue home Synthroid -Check TSH   DVT ppx: SQ Lovenox  Code Status: Full code Family Communication:   Yes, patient's daughter  at bed side Disposition Plan: Admit to inpatient   Date of Service 09/01/2015    Ivor Costa [facility] Pager [phone]  If 7PM-7AM, please contact night-coverage [email or web address] Password [email or web address] 09/01/2015, 11:21 PM

## 2015-09-02 ENCOUNTER — Encounter (HOSPITAL_COMMUNITY): Payer: Self-pay | Admitting: *Deleted

## 2015-09-02 DIAGNOSIS — E039 Hypothyroidism, unspecified: Secondary | ICD-10-CM | POA: Insufficient documentation

## 2015-09-02 DIAGNOSIS — E038 Other specified hypothyroidism: Secondary | ICD-10-CM

## 2015-09-02 LAB — BASIC METABOLIC PANEL
ANION GAP: 13 (ref 5–15)
BUN: 18 mg/dL (ref 6–20)
CHLORIDE: 98 mmol/L — AB (ref 101–111)
CO2: 18 mmol/L — ABNORMAL LOW (ref 22–32)
Calcium: 8.4 mg/dL — ABNORMAL LOW (ref 8.9–10.3)
Creatinine, Ser: 0.88 mg/dL (ref 0.44–1.00)
GFR calc Af Amer: >60 mL/min (ref >60)
GFR calc non Af Amer: 58 mL/min — ABNORMAL LOW (ref >60)
GLUCOSE: 123 mg/dL — AB (ref 65–99)
POTASSIUM: 4 mmol/L (ref 3.5–5.1)
SODIUM: 129 mmol/L — AB (ref 135–145)

## 2015-09-02 LAB — LIPID PANEL
CHOL/HDL RATIO: 2.4 ratio
Cholesterol: 117 mg/dL (ref 0–200)
HDL: 48 mg/dL (ref >40)
LDL CALC: 64 mg/dL (ref 0–99)
Triglycerides: 27 mg/dL (ref <150)
VLDL: 5 mg/dL (ref 0–40)

## 2015-09-02 LAB — OSMOLALITY, URINE: Osmolality, Ur: 378 mOsm/kg (ref 300–900)

## 2015-09-02 LAB — INFLUENZA PANEL BY PCR (TYPE A & B)
H1N1 flu by pcr: NOT DETECTED
Influenza A By PCR: NEGATIVE
Influenza B By PCR: NEGATIVE

## 2015-09-02 LAB — GLUCOSE, CAPILLARY
Glucose-Capillary: 80 mg/dL (ref 65–99)
Glucose-Capillary: 111 mg/dL — ABNORMAL HIGH (ref 65–99)

## 2015-09-02 LAB — APTT: aPTT: 31 seconds (ref 24–37)

## 2015-09-02 LAB — LACTIC ACID, PLASMA
LACTIC ACID, VENOUS: 0.8 mmol/L (ref 0.5–2.0)
LACTIC ACID, VENOUS: 1.1 mmol/L (ref 0.5–2.0)

## 2015-09-02 LAB — TSH: TSH: 5.879 u[IU]/mL — AB (ref 0.350–4.500)

## 2015-09-02 LAB — PROCALCITONIN: Procalcitonin: <0.1 ng/mL

## 2015-09-02 LAB — MAGNESIUM: Magnesium: 1.1 mg/dL — ABNORMAL LOW (ref 1.7–2.4)

## 2015-09-02 LAB — LIPASE, BLOOD: Lipase: 74 U/L — ABNORMAL HIGH (ref 11–51)

## 2015-09-02 LAB — PROTIME-INR
INR: 1.26 (ref 0.00–1.49)
PROTHROMBIN TIME: 16 s — AB (ref 11.6–15.2)

## 2015-09-02 LAB — OSMOLALITY: OSMOLALITY: 270 mosm/kg — AB (ref 275–295)

## 2015-09-02 MED ORDER — MAGNESIUM SULFATE 50 % IJ SOLN
3.0000 g | Freq: Once | INTRAVENOUS | Status: AC
  Administered 2015-09-02: 3 g via INTRAVENOUS
  Filled 2015-09-02: qty 6

## 2015-09-02 MED ORDER — LEVOTHYROXINE SODIUM 75 MCG PO TABS: 75.0000 ug | ORAL_TABLET | Freq: Every day | ORAL | Status: DC

## 2015-09-02 MED ORDER — PNEUMOCOCCAL VAC POLYVALENT 25 MCG/0.5ML IJ INJ
0.5000 mL | INJECTION | INTRAMUSCULAR | Status: AC
  Administered 2015-09-03: 0.5 mL via INTRAMUSCULAR
  Filled 2015-09-02: qty 0.5
  Filled 2015-09-02: qty 1

## 2015-09-02 MED ORDER — LEVOTHYROXINE SODIUM 75 MCG PO TABS
75.0000 ug | ORAL_TABLET | Freq: Every day | ORAL | Status: DC
  Administered 2015-09-03 – 2015-09-06 (×4): 75 ug via ORAL
  Filled 2015-09-02 (×4): qty 1

## 2015-09-02 MED ORDER — SODIUM BICARBONATE 650 MG PO TABS
650.0000 mg | ORAL_TABLET | Freq: Two times a day (BID) | ORAL | Status: DC
  Administered 2015-09-02 – 2015-09-03 (×3): 650 mg via ORAL
  Filled 2015-09-02 (×3): qty 1

## 2015-09-02 MED ORDER — SODIUM CHLORIDE 0.9 % IV BOLUS (SEPSIS)
500.0000 mL | Freq: Once | INTRAVENOUS | Status: AC
  Administered 2015-09-02: 500 mL via INTRAVENOUS

## 2015-09-02 MED ORDER — SODIUM CHLORIDE 0.9 % IV SOLN
INTRAVENOUS | Status: DC
  Administered 2015-09-02 – 2015-09-03 (×2): via INTRAVENOUS

## 2015-09-02 NOTE — Plan of Care (Signed)
Problem: Acute Rehab PT Goals(only PT should resolve) Goal: Pt Will Go Up/Down Stairs With AD as needed

## 2015-09-02 NOTE — Significant Event (Signed)
Rapid Response Event Note  Overview: Time Called: 1430 Arrival Time: 1433    Initial Focused Assessment: Called to see patient for onset of weakness, low blood pressure. Upon arrival pale, thin elderly woman in bed with oxygen and cloth on her head. Responsive to verbal stimuli. Alert and follows commands. States she just got very weak. Nurse reports onset of symptoms about 5 minutes after walking with PT, while patient was sitting in the chair. Upon arrival MD is on the phone with the nurse. Saline bolus ordered and infusing.   Interventions: Repeat BP [id number] systolic and patient feeling better. Call to [facility] revealed drop in HR to  40's at  1425. Some question about junctional escape beats from [employer]. Requested them to post strips.  Event Summary:   at      at          Baron Hamper

## 2015-09-02 NOTE — Progress Notes (Addendum)
PROGRESS NOTE  Donna Burch [id number] DOB: August 15, 1926 DOA: 09/01/2015 PCP: Vidal Schwalbe, MD  HPI/Recap of past 24 hours:  No n/v, no ab pain, no diarrhea , no cough, reported feeling weak, but better over all, daughter and son in room  Addendum: patient walked with PT became significant orthostatic at the end the her walk, sbp standing 80/40 per RN, patient returned to bed and laying down bp better with fluids bolus running, denies chest pain, no sob.  Assessment/Plan: Principal Problem:   Syncope Active Problems:   Recurrent UTI (urinary tract infection)   CLL (chronic lymphocytic leukemia) (HCC)   Hypertension   Hyperlipidemia   GERD (gastroesophageal reflux disease)   Hypothyroidism   Nausea & vomiting   Hyponatremia   Cough  Syncope: Etiology is not clear. Differential diagnosis include orthostatic status secondary to dehydration due to nausea vomiting, vasovagal syncope and UTI. Patient does not have any chest pain or palpitation, less likely to have cardiac etiology. No signs of unilateral weakness numbness or tenderness, less likely to have TIA or stroke. Will keep on tele to r/o arrhythmia.  Ct head no acute findings. No hypoxia, no chest pain, less likely PE. Patient is clinically significantly dehydrated.  - orthostatic vital signs positive heart rate increased from 81 laying to 99 upon standing. Became significantly orthostatic while walking short distance with PT on 3/11, review tele during this episodes there is bradycardia, ? Few junctional rhythm? Will get ekg. Continue tele. Patient has h/o prednisone allergy ( with significant anxiety), will hold off on stress dose steroids. Consider ted hose and abdominal binder, increase salt intake,  check am cortisol level. Will hold all home bp meds for now. Cardiology consulted due to junctional rhythm with hypotension, concerning arrhythmia induced syncope. -IV fluid: Normal saline bolus 1.5 Lx1 on admission, followed  by maintenance fluids.  -PT/OT  Hyponatremia: Likely due to combination of decreased oral intake, GI loss from vomiting and home meds HCTZ. -Switch that Diovan-HCTZ to Irbesartan -IVF as above -urine studies in the use of hctz not reliable.  Hypomagnesemia: replace mag.   Recurrent UTI (urinary tract infection): she was diagnosed with UTI by PCP 2 days ago. She was given ciprofloxacin, still has symptoms. Patient is not septic on admission. Hemodynamically stable. Pt does not meet criteria for sepsis. Lactate wnl. -switch to IV rocephin -f/u Bx and Ux. -Procalcitonin less than 0.1, lactic acid trended down from 1.1 on admission to 0.8 after hydration.  Cough: Patient has been coughing for 3 weeks. Reported sick contact, Initially coughed up yellow color sputum, which become clear. Chest x-ray is negative. Patient does not have chest pain or shortness breath. Lungs clear on auscultation. Patient may have upper respiratory viral infection. Bronchitis. -Mucinex of a cough -flu PCR negative and respiratory virus panel -sputum culture, on abx to cover possible bronchitis  Nausea and vomiting: Unclear etiology. Reported vomited once on Thursday, denies acid reflux symptom, denies dysphagia, she reported she coughed too much, possibly cough induced, Patient does not have abdominal pain, could also be due to UTI. -EGD 11/2014; 8 cm hiatal hernia. No distal esophageal stricture. 2. Intermittent tertiary contractions in the distal esophagus lipase mildly elevated, consider Gi consult if symptom persists Lipase elevated? Keep on clears for now, repeat lipase , curently no n/v, no ab pain, will advance diet as tolerated. -Treated UTI as above,  -When necessary Zofran for nausea   CLL (chronic lymphocytic leukemia) ([place]): followed by hematologist dr. Sullivan Lone. Last seen  was on 07/19/15. Per Dr. Lupita Shutter note, pt has CCL of Rai stage 0. No indication for treatment of her CLL at this time.  WBC=11.7 -f/u with hematology   HLD: Last LDL was not on records -Continue home medications: Lipitor -FLP ldl 64  Hypertension: -Switch that Diovan-HCTZ to Irbesartan as above -continue amlodipin  GERD: -Protonix   Hypothyroidism: Last TSH was 0.85 on 10/07/10 -increase Synthroid from 55mcg to 33mcg -TSH 5.8   DVT ppx: SQ Lovenox  Code Status: Full code Family Communication: Yes, patient's daughterand son at bed side Disposition Plan: not medically ready to be discharged , eventually home in a few days    Consultants:  none  Procedures:  none  Antibiotics:  Rocephin from admission   Objective: BP 138/60 mmHg  Pulse 93  Temp(Src) 97.8 F (36.6 C) (Oral)  Resp 20  Wt 61.054 kg (134 lb 9.6 oz)  SpO2 93%  Intake/Output Summary (Last 24 hours) at 09/02/15 0919 Last data filed at 09/02/15 0600  Gross per 24 hour  Intake 938.33 ml  Output   1500 ml  Net -561.67 ml   Filed Weights   09/02/15 0333  Weight: 61.054 kg (134 lb 9.6 oz)    Exam:   General:  NAD,well developed but  Thin (reported lost weight when she had c diff a few years ago, never regain it back)  Cardiovascular: RRR  Respiratory: CTABL  Abdomen: Soft/ND/NT, positive BS  Musculoskeletal: No Edema  Neuro: aaox3  Data Reviewed: Basic Metabolic Panel:  Recent Labs Lab 09/01/15 1454 09/01/15 1758  NA 123*  --   K 3.8  --   CL 92*  --   CO2 18*  --   GLUCOSE 142*  --   BUN 24*  --   CREATININE 0.98  --   CALCIUM 9.4  --   MG  --  1.4*   Liver Function Tests:  Recent Labs Lab 09/01/15 1758  AST 34  ALT 23  ALKPHOS 76  BILITOT 0.4  PROT 6.8  ALBUMIN 3.1*    Recent Labs Lab 09/02/15 0052  LIPASE 74*   No results for input(s): AMMONIA in the last 168 hours. CBC:  Recent Labs Lab 09/01/15 1454  WBC 11.7*  HGB 10.9*  HCT 31.6*  MCV 87.3  PLT 274   Cardiac Enzymes:    Recent Labs Lab 09/01/15 1758  TROPONINI <0.03   BNP (last 3  results)  Recent Labs  09/01/15 1758  BNP 46.2    ProBNP (last 3 results) No results for input(s): PROBNP in the last 8760 hours.  CBG:  Recent Labs Lab 09/01/15 2057 09/02/15 0702  GLUCAP 89 80    No results found for this or any previous visit (from the past 240 hour(s)).   Studies: Dg Chest 2 View  09/01/2015  CLINICAL DATA:  Weakness EXAM: CHEST  2 VIEW COMPARISON:  12/01/2014 FINDINGS: Heart size within normal limits. Negative for heart failure or edema. Negative for infiltrate effusion or mass. Lungs remain clear Moderately large hiatal hernia Interval right thyroid surgery. IMPRESSION: No active cardiopulmonary disease. Electronically Signed   By: Franchot Gallo M.D.   On: 09/01/2015 18:16   Ct Head Wo Contrast  09/01/2015  CLINICAL DATA:  82 year old female with several syncopal episodes for the past 5 days, currently being treated for urinary tract infection. EXAM: CT HEAD WITHOUT CONTRAST TECHNIQUE: Contiguous axial images were obtained from the base of the skull through the vertex without intravenous contrast. COMPARISON:  No priors. FINDINGS: Mild cerebral atrophy. Patchy and confluent areas of decreased attenuation are noted throughout the deep and periventricular white matter of the cerebral hemispheres bilaterally, compatible with chronic microvascular ischemic disease. No acute intracranial abnormalities. Specifically, no evidence of acute intracranial hemorrhage, no definite findings of acute/subacute cerebral ischemia, no mass, mass effect, hydrocephalus or abnormal intra or extra-axial fluid collections. Visualized paranasal sinuses and mastoids are well pneumatized. No acute displaced skull fractures are identified. IMPRESSION: 1. No acute intracranial abnormalities. 2. Mild cerebral atrophy with chronic microvascular ischemic changes in cerebral white matter, as above. Electronically Signed   By: Vinnie Langton M.D.   On: 09/01/2015 18:49    Scheduled Meds: .  amLODipine  5 mg Oral Daily  . aspirin EC  81 mg Oral Daily  . atorvastatin  80 mg Oral q1800  . cefTRIAXone (ROCEPHIN)  IV  1 g Intravenous Q24H  . cholecalciferol  2,000 Units Oral BID  . dextromethorphan-guaiFENesin  1 tablet Oral BID  . enoxaparin (LOVENOX) injection  40 mg Subcutaneous QHS  . irbesartan  150 mg Oral Daily  . levothyroxine  50 mcg Oral QAC breakfast  . magnesium chloride  1 tablet Oral BID  . pantoprazole  40 mg Oral Daily    Continuous Infusions:    Time spent: 35 mins   Dim Meisinger MD, PhD  [facility] Pager [phone]. If 7PM-7AM, please contact night-coverage at [email or web address], password [facility] 09/02/2015, 9:19 AM  LOS: 1 day

## 2015-09-02 NOTE — Progress Notes (Signed)
Around 2pm, pt's BP dropped all of sudden to 50/20, it was happened after about 5-10 min  pt walked with PT, pt felt like passing out, called RRT, MD, 500cc bolus provided and patient came back to normal position, latest BP 111/58, will continue to monitor the patient.

## 2015-09-02 NOTE — Evaluation (Signed)
Physical Therapy Evaluation Patient Details Name: Donna Burch MRN: [id number] DOB: 12-29-1926 Today's Date: 09/02/2015   History of Present Illness  82 y.o. female with PMH of CLL, hypertension, hyperlipidemia, GERD, hypothyroidism, kidney stone, C. difficile colitis, skin cancer, recurrent UTI, arthritis, who presents with cough, generalized weakness, nausea, vomiting, increased urinary frequency, syncope, hyponatremia  Clinical Impression  Pt was able to be up in hallway with droplet precautions observed, has used IV pole to provide touch assist, but may not need AD at all given her PLOF.  Will try stairs with no rail next visit to ensure she is going to safely return to home.    Follow Up Recommendations Home health PT;Supervision - Intermittent    Equipment Recommendations  Other (comment) (will assess as needed)    Recommendations for Other Services       Precautions / Restrictions Precautions Precautions: Fall (telemetry) Restrictions Weight Bearing Restrictions: No      Mobility  Bed Mobility Overal bed mobility: Modified Independent                Transfers Overall transfer level: Modified independent Equipment used: 1 person hand held assist (for safety)                Ambulation/Gait Ambulation/Gait assistance: Supervision;Min guard Ambulation Distance (Feet): 175 Feet Assistive device: 1 person hand held assist (used IV pole) Gait Pattern/deviations: Step-through pattern;Narrow base of support Gait velocity: reduced Gait velocity interpretation: Below normal speed for age/gender General Gait Details: controlled pace and safe maneuvering with obstacles  Stairs            Wheelchair Mobility    Modified Rankin (Stroke Patients Only)       Balance                                             Pertinent Vitals/Pain Pain Assessment: No/denies pain    Home Living Family/patient expects to be discharged to::  Private residence Living Arrangements: Alone Available Help at Discharge: Family;Available PRN/intermittently Type of Home: House Home Access: Stairs to enter Entrance Stairs-Rails: None Entrance Stairs-Number of Steps: 1+1 Home Layout: One level Home Equipment: Cane - single point      Prior Function Level of Independence: Independent               Hand Dominance        Extremity/Trunk Assessment   Upper Extremity Assessment: Overall WFL for tasks assessed           Lower Extremity Assessment: Generalized weakness      Cervical / Trunk Assessment: Normal  Communication   Communication: HOH  Cognition Arousal/Alertness: Awake/alert Behavior During Therapy: WFL for tasks assessed/performed Overall Cognitive Status: Within Functional Limits for tasks assessed                      General Comments General comments (skin integrity, edema, etc.): Pt is having some weakness and SOB at admission but not evident with eval.  On IV for dehydration    Exercises        Assessment/Plan    PT Assessment Patient needs continued PT services  PT Diagnosis Abnormality of gait   PT Problem List Decreased strength;Decreased range of motion;Decreased activity tolerance;Decreased balance;Decreased mobility;Decreased coordination;Cardiopulmonary status limiting activity  PT Treatment Interventions DME instruction;Gait training;Stair training;Functional mobility training;Therapeutic activities;Therapeutic exercise;Balance training;Neuromuscular re-education;Patient/family  education   PT Goals (Current goals can be found in the Care Plan section) Acute Rehab PT Goals Patient Stated Goal: to be able to get home safely PT Goal Formulation: With patient/family Time For Goal Achievement: 09/16/15 Potential to Achieve Goals: Good    Frequency Min 3X/week   Barriers to discharge Inaccessible home environment;Decreased caregiver support      Co-evaluation                End of Session   Activity Tolerance: Patient tolerated treatment well;Patient limited by fatigue Patient left: in chair;with call bell/phone within reach;with family/visitor present Nurse Communication: Mobility status         Time: [id number] PT Time Calculation (min) (ACUTE ONLY): 26 min   Charges:   PT Evaluation $PT Eval Low Complexity: 1 Procedure PT Treatments $Gait Training: 8-22 mins   PT G Codes:        Ramond Dial 09/25/15, 2:26 PM   Mee Hives, PT MS Acute Rehab Dept. Number: [facility] [id number] and [place] [phone]

## 2015-09-02 NOTE — Consult Note (Signed)
CARDIOLOGY INPATIENT CONSULTATION NOTE  Patient ID: Donna Burch MRN: [id number], DOB/AGE: 29-Nov-1926   Admit date: 09/01/2015   Primary Physician: Vidal Schwalbe, MD Primary Cardiologist: none  Reason for Consult:   Dizziness, concern for cardiogenic arrhythmia related dizziness  Requesting Physician: Dr. Erlinda Hong  HPI: This is a 82 y.o. female with no prior history of CAD or syncope, history HTN on diuretics, HLD, GERD, hypothyroidism and CLL (not on treatment) who presented with nausea, vomiting and lightheadedness.  Patient initially presented with syncope to her PCP on 3/08. Her sodium level was 129 at that time and she was told to go to ED for evaluation of dehydration. She said that she passed out on Thursday. She was standing when she felt lightheaded and sat down. She does not remember what happened after that but when she came to senses, she had vomit all over the chest. She did not have similar symptoms in the past. Prior to that she has been having cough and congestion of the chest. She is a poor historian.  Today she was planned to have PT and she sat up and started feeling lightheaded. Her HR was 40-50 bpm at that time. She was found to be in junctional rhythm at that time. Her standing BP was 80/40. She was given 500 cc of bolus and her BP improved. Her symptoms also improved. She did not have any prodrome, no flushing, palpitations, chest pain prior to the episode was noted.    Problem List: Past Medical History  Diagnosis Date  . Elevated brain natriuretic peptide (BNP) level     Slight  . Hiatal hernia   . Hypertension   . Hyperlipidemia   . Nocturia     x3  . Gastroenteritis 09/2010    c. dificile.  hospitalized in ICU x 3 days  . Dyspnea on exertion     Improved with exercise  . Renal insufficiency   . Nephrolithiasis   . GERD (gastroesophageal reflux disease)   . History of Clostridium difficile ?03/2012    "after taking ATB for one of my UTI's"  . Wears  glasses   . Seasonal allergies   . Skin cancer     right anterior neck  . PONV (postoperative nausea and vomiting)   . Family history of adverse reaction to anesthesia     son, Marita Snellen, w/PONV  . Pneumonia     Required hospital stay at the time  . Hypothyroidism   . Frequent UTI     "used to; none lately" (01/03/2015)  . Arthritis     "right middle finger; hands" (01/03/2015)  . CLL (chronic lymphocytic leukemia) ([place])     Past Surgical History  Procedure Laterality Date  . Cataract extraction, bilateral  2010  . Wisdom tooth extraction    . Lithotripsy      "didn't work"  . Esophagogastroduodenoscopy    . Esophagogastroduodenoscopy (egd) with esophageal dilation    . Colonoscopy    . Knee arthroscopy Left   . Thyroid lobectomy Right 01/03/2015  . Cystoscopy with stent placement    . Cystoscopy w/ stone manipulation Right   . Tonsillectomy    . Skin cancer excision Right     anterior neck  . Thyroid lobectomy Right 01/03/2015    Procedure: RIGHT THYROID LOBECTOMY;  Surgeon: Armandina Gemma, MD;  Location: [place];  Service: General;  Laterality: Right;     Allergies:  Allergies  Allergen Reactions  . Prednisone Other (See Comments)    "  Jittery"  . Reclast [Zoledronic Acid] Other (See Comments)    Pt did not have a memory, from the day after she took that medication      Home Medications Current Facility-Administered Medications  Medication Dose Route Frequency Provider Last Rate Last Dose  . 0.9 %  sodium chloride infusion   Intravenous Continuous Florencia Reasons, MD 75 mL/hr at 09/02/15 1529    . acetaminophen (TYLENOL) tablet 650 mg  650 mg Oral Q6H PRN Ivor Costa, MD       Or  . acetaminophen (TYLENOL) suppository 650 mg  650 mg Rectal Q6H PRN Ivor Costa, MD      . albuterol (PROVENTIL) (2.5 MG/3ML) 0.083% nebulizer solution 2.5 mg  2.5 mg Nebulization Q4H PRN Ivor Costa, MD      . aspirin EC tablet 81 mg  81 mg Oral Daily Ivor Costa, MD   81 mg at 09/02/15 0924  . atorvastatin  (LIPITOR) tablet 80 mg  80 mg Oral q1800 Ivor Costa, MD   80 mg at 09/02/15 1716  . cefTRIAXone (ROCEPHIN) 1 g in dextrose 5 % 50 mL IVPB  1 g Intravenous Q24H Ivor Costa, MD 100 mL/hr at 09/01/15 2126 1 g at 09/01/15 2126  . cholecalciferol (VITAMIN D) tablet 2,000 Units  2,000 Units Oral BID Ivor Costa, MD   2,000 Units at 09/02/15 [phone]  . dextromethorphan-guaiFENesin (MUCINEX DM) 30-600 MG per 12 hr tablet 1 tablet  1 tablet Oral BID Ivor Costa, MD   1 tablet at 09/02/15 0924  . enoxaparin (LOVENOX) injection 40 mg  40 mg Subcutaneous QHS Ivor Costa, MD   40 mg at 09/01/15 2328  . HYDROcodone-acetaminophen (NORCO/VICODIN) 5-325 MG per tablet 1-2 tablet  1-2 tablet Oral Q4H PRN Ivor Costa, MD      . Derrill Memo ON 09/03/2015] levothyroxine (SYNTHROID, LEVOTHROID) tablet 75 mcg  75 mcg Oral QAC breakfast Florencia Reasons, MD      . magnesium chloride (SLOW-MAG) 64 MG SR tablet 64 mg  1 tablet Oral BID Ivor Costa, MD   64 mg at 09/02/15 0924  . ondansetron (ZOFRAN) tablet 4 mg  4 mg Oral Q6H PRN Ivor Costa, MD       Or  . ondansetron [facility]) injection 4 mg  4 mg Intravenous Q6H PRN Ivor Costa, MD      . pantoprazole (PROTONIX) EC tablet 40 mg  40 mg Oral Daily Ivor Costa, MD   40 mg at 09/02/15 0924  . sodium bicarbonate tablet 650 mg  650 mg Oral BID Florencia Reasons, MD   650 mg at 09/02/15 1204  . zolpidem (AMBIEN) tablet 5 mg  5 mg Oral QHS PRN Ivor Costa, MD         Family History  Problem Relation Age of Onset  . Heart attack Mother   . Hyperlipidemia Mother   . Asthma Father   . Diabetes Father      Social History   Social History  . Marital Status: Widowed    Spouse Name: N/A  . Number of Children: N/A  . Years of Education: N/A   Occupational History  . Not on file.   Social History Main Topics  . Smoking status: Never Smoker   . Smokeless tobacco: Never Used  . Alcohol Use: No  . Drug Use: No  . Sexual Activity: No   Other Topics Concern  . Not on file   Social History Narrative     Review  of Systems: General: fatigue increase  weight negative for chills, fever, night sweats  Cardiovascular: denied chest pain, dyspnea, PND, orthopnea, leg swelling, increased abdominal girth, syncope, presyncope, palpitations.  Dermatological: negative for rash Respiratory: productive cough recently negative for wheezing  Urologic: positive for UTI negative for hematuria Abdominal: nausea/vomiting negative for diarrhea, bright red blood per rectum, melena, or hematemesis Neurologic: negative for visual changes, syncope, or dizziness Hematology: anemia, on iron supplements Psychiatry: non suicidal/homicidal  Musculoskeletal: no back pain   Physical Exam: Vitals: BP 111/58 mmHg  Pulse 70  Temp(Src) 97.5 F (36.4 C) (Oral)  Resp 20  Wt 61.054 kg (134 lb 9.6 oz)  SpO2 99% General: not in acute distress Neck: JVP flat, neck supple Heart: regular rate and rhythm, S1, S2, no murmurs  Lungs: CTAB  GI: non tender, non distended, bowel sounds present Extremities: no edema Neuro: AAO x 3  Psych: normal affect, no anxiety   Labs:   Results for orders placed or performed during the hospital encounter of 09/01/15 (from the past 24 hour(s))  CBG monitoring, ED     Status: None   Collection Time: 09/01/15  8:57 PM  Result Value Ref Range   Glucose-Capillary 89 65 - 99 mg/dL  Urinalysis, Routine w reflex microscopic (not at [facility])     Status: Abnormal   Collection Time: 09/01/15 10:01 PM  Result Value Ref Range   Color, Urine YELLOW YELLOW   APPearance CLEAR CLEAR   Specific Gravity, Urine 1.011 1.005 - 1.030   pH 7.0 5.0 - 8.0   Glucose, UA NEGATIVE NEGATIVE mg/dL   Hgb urine dipstick NEGATIVE NEGATIVE   Bilirubin Urine NEGATIVE NEGATIVE   Ketones, ur NEGATIVE NEGATIVE mg/dL   Protein, ur NEGATIVE NEGATIVE mg/dL   Nitrite NEGATIVE NEGATIVE   Leukocytes, UA MODERATE (A) NEGATIVE  Urine microscopic-add on     Status: Abnormal   Collection Time: 09/01/15 10:01 PM  Result Value Ref Range     Squamous Epithelial / LPF 0-5 (A) NONE SEEN   WBC, UA 6-30 0 - 5 WBC/hpf   RBC / HPF 0-5 0 - 5 RBC/hpf   Bacteria, UA RARE (A) NONE SEEN  Urine culture     Status: None (Preliminary result)   Collection Time: 09/01/15 10:02 PM  Result Value Ref Range   Specimen Description URINE, CLEAN CATCH    Special Requests NONE    Culture NO GROWTH < 12 HOURS    Report Status PENDING   Sodium, urine, random     Status: None   Collection Time: 09/01/15 10:02 PM  Result Value Ref Range   Sodium, Ur 69 mmol/L  Influenza panel by PCR (type A & B, H1N1)     Status: None   Collection Time: 09/01/15 11:40 PM  Result Value Ref Range   Influenza A By PCR NEGATIVE NEGATIVE   Influenza B By PCR NEGATIVE NEGATIVE   H1N1 flu by pcr NOT DETECTED NOT DETECTED  Lactic acid, plasma     Status: None   Collection Time: 09/02/15 12:51 AM  Result Value Ref Range   Lactic Acid, Venous 1.1 0.5 - 2.0 mmol/L  Osmolality     Status: Abnormal   Collection Time: 09/02/15 12:52 AM  Result Value Ref Range   Osmolality 270 (L) 275 - 295 mOsm/kg  Lipase, blood     Status: Abnormal   Collection Time: 09/02/15 12:52 AM  Result Value Ref Range   Lipase 74 (H) 11 - 51 U/L  Procalcitonin     Status: None  Collection Time: 09/02/15 12:52 AM  Result Value Ref Range   Procalcitonin <0.10 ng/mL  Protime-INR     Status: Abnormal   Collection Time: 09/02/15 12:52 AM  Result Value Ref Range   Prothrombin Time 16.0 (H) 11.6 - 15.2 seconds   INR 1.26 0.00 - 1.49  APTT     Status: None   Collection Time: 09/02/15 12:52 AM  Result Value Ref Range   aPTT 31 24 - 37 seconds  TSH     Status: Abnormal   Collection Time: 09/02/15 12:53 AM  Result Value Ref Range   TSH 5.879 (H) 0.350 - 4.500 uIU/mL  Lipid panel     Status: None   Collection Time: 09/02/15 12:54 AM  Result Value Ref Range   Cholesterol 117 0 - 200 mg/dL   Triglycerides 27 <150 mg/dL   HDL 48 >40 mg/dL   Total CHOL/HDL Ratio 2.4 RATIO   VLDL 5 0 - 40  mg/dL   LDL Cholesterol 64 0 - 99 mg/dL  Lactic acid, plasma     Status: None   Collection Time: 09/02/15  2:32 AM  Result Value Ref Range   Lactic Acid, Venous 0.8 0.5 - 2.0 mmol/L  Glucose, capillary     Status: None   Collection Time: 09/02/15  7:02 AM  Result Value Ref Range   Glucose-Capillary 80 65 - 99 mg/dL  Magnesium     Status: Abnormal   Collection Time: 09/02/15  9:56 AM  Result Value Ref Range   Magnesium 1.1 (L) 1.7 - 2.4 mg/dL  Basic metabolic panel     Status: Abnormal   Collection Time: 09/02/15  9:56 AM  Result Value Ref Range   Sodium 129 (L) 135 - 145 mmol/L   Potassium 4.0 3.5 - 5.1 mmol/L   Chloride 98 (L) 101 - 111 mmol/L   CO2 18 (L) 22 - 32 mmol/L   Glucose, Bld 123 (H) 65 - 99 mg/dL   BUN 18 6 - 20 mg/dL   Creatinine, Ser 0.88 0.44 - 1.00 mg/dL   Calcium 8.4 (L) 8.9 - 10.3 mg/dL   GFR calc non Af Amer 58 (L) >60 mL/min   GFR calc Af Amer >60 >60 mL/min   Anion gap 13 5 - 15  Osmolality, urine     Status: None   Collection Time: 09/02/15 10:24 AM  Result Value Ref Range   Osmolality, Ur 378 300 - 900 mOsm/kg  Glucose, capillary     Status: Abnormal   Collection Time: 09/02/15  2:36 PM  Result Value Ref Range   Glucose-Capillary 111 (H) 65 - 99 mg/dL   Comment 1 Notify RN    Comment 2 Document in Chart      Radiology/Studies: Dg Chest 2 View  09/01/2015  CLINICAL DATA:  Weakness EXAM: CHEST  2 VIEW COMPARISON:  12/01/2014 FINDINGS: Heart size within normal limits. Negative for heart failure or edema. Negative for infiltrate effusion or mass. Lungs remain clear Moderately large hiatal hernia Interval right thyroid surgery. IMPRESSION: No active cardiopulmonary disease. Electronically Signed   By: Franchot Gallo M.D.   On: 09/01/2015 18:16   Ct Head Wo Contrast  09/01/2015  CLINICAL DATA:  82 year old female with several syncopal episodes for the past 5 days, currently being treated for urinary tract infection. EXAM: CT HEAD WITHOUT CONTRAST  TECHNIQUE: Contiguous axial images were obtained from the base of the skull through the vertex without intravenous contrast. COMPARISON:  No priors. FINDINGS: Mild cerebral atrophy.  Patchy and confluent areas of decreased attenuation are noted throughout the deep and periventricular white matter of the cerebral hemispheres bilaterally, compatible with chronic microvascular ischemic disease. No acute intracranial abnormalities. Specifically, no evidence of acute intracranial hemorrhage, no definite findings of acute/subacute cerebral ischemia, no mass, mass effect, hydrocephalus or abnormal intra or extra-axial fluid collections. Visualized paranasal sinuses and mastoids are well pneumatized. No acute displaced skull fractures are identified. IMPRESSION: 1. No acute intracranial abnormalities. 2. Mild cerebral atrophy with chronic microvascular ischemic changes in cerebral white matter, as above. Electronically Signed   By: Vinnie Langton M.D.   On: 09/01/2015 18:49    EKG: normal sinus rhythm, first AV block, normal axis, no ST/T wave changes  Echo: pending  Cardiac cath: none  Medical decision making:  Discussed care with the patient and the daughter Discussed care with the physician on the phone Reviewed labs and imaging personally Reviewed prior records  ASSESSMENT AND PLAN:  This is a 82 y.o. female with no prior history of syncope with recent viral illness and nausea/vomiting who was treated with diuretics for HTN presented with syncopal episode.    Principal Problem:   Syncope Active Problems:   Recurrent UTI (urinary tract infection)   CLL (chronic lymphocytic leukemia) ([facility])   Hypertension   Hyperlipidemia   GERD (gastroesophageal reflux disease)   Hypothyroidism   Nausea & vomiting   Hyponatremia   Cough   Thyroid activity decreased   Hypomagnesemia   Orthostatic hypotension with junctional rhythm Telemetry showed AV dissociation with competing junctional rhythm at the  rate of 40. Appears to be orthostatic related event, can be explained with symptoms of dehydration and response to IV fluids EKG changes might be due to increased vagal tone, patient has good HR variability on telemetry review, lowest HR was 60s except the event today with highest HR in 100s.  Discussed about event monitor for evaluation of arrhythmia/rhythm disturbances Due to recent viral illness and improvement in symptoms, will reserve PPM for now Keep potassium >4, calcium >9, magnesium >2  Signed, Flossie Dibble, MD MS 09/02/2015, 6:34 PM

## 2015-09-03 ENCOUNTER — Inpatient Hospital Stay (HOSPITAL_COMMUNITY): Payer: Medicare Other

## 2015-09-03 DIAGNOSIS — R55 Syncope and collapse: Secondary | ICD-10-CM | POA: Diagnosis not present

## 2015-09-03 DIAGNOSIS — I4949 Other premature depolarization: Secondary | ICD-10-CM

## 2015-09-03 DIAGNOSIS — I951 Orthostatic hypotension: Secondary | ICD-10-CM

## 2015-09-03 DIAGNOSIS — R001 Bradycardia, unspecified: Secondary | ICD-10-CM | POA: Insufficient documentation

## 2015-09-03 LAB — MAGNESIUM: Magnesium: 1.7 mg/dL (ref 1.7–2.4)

## 2015-09-03 LAB — BASIC METABOLIC PANEL
Anion gap: 7 (ref 5–15)
BUN: 13 mg/dL (ref 6–20)
CHLORIDE: 104 mmol/L (ref 101–111)
CO2: 19 mmol/L — ABNORMAL LOW (ref 22–32)
Calcium: 8 mg/dL — ABNORMAL LOW (ref 8.9–10.3)
Creatinine, Ser: 0.84 mg/dL (ref 0.44–1.00)
GFR calc Af Amer: >60 mL/min (ref >60)
GFR calc non Af Amer: >60 mL/min (ref >60)
GLUCOSE: 102 mg/dL — AB (ref 65–99)
POTASSIUM: 3.2 mmol/L — AB (ref 3.5–5.1)
Sodium: 130 mmol/L — ABNORMAL LOW (ref 135–145)

## 2015-09-03 LAB — CBC
HCT: 27.1 % — ABNORMAL LOW (ref 36.0–46.0)
HEMOGLOBIN: 9.1 g/dL — AB (ref 12.0–15.0)
MCH: 29.9 pg (ref 26.0–34.0)
MCHC: 33.6 g/dL (ref 30.0–36.0)
MCV: 89.1 fL (ref 78.0–100.0)
PLATELETS: 255 10*3/uL (ref 150–400)
RBC: 3.04 MIL/uL — AB (ref 3.87–5.11)
RDW: 13.6 % (ref 11.5–15.5)
WBC: 9.3 10*3/uL (ref 4.0–10.5)

## 2015-09-03 LAB — URINE CULTURE

## 2015-09-03 LAB — CORTISOL: Cortisol, Plasma: 9.5 ug/dL

## 2015-09-03 LAB — T4, FREE: FREE T4: 1 ng/dL (ref 0.61–1.12)

## 2015-09-03 LAB — GLUCOSE, CAPILLARY: Glucose-Capillary: 90 mg/dL (ref 65–99)

## 2015-09-03 MED ORDER — IPRATROPIUM-ALBUTEROL 0.5-2.5 (3) MG/3ML IN SOLN
3.0000 mL | Freq: Three times a day (TID) | RESPIRATORY_TRACT | Status: DC
  Administered 2015-09-03 – 2015-09-04 (×3): 3 mL via RESPIRATORY_TRACT
  Filled 2015-09-03 (×3): qty 3

## 2015-09-03 MED ORDER — FLUTICASONE PROPIONATE 50 MCG/ACT NA SUSP
1.0000 | Freq: Once | NASAL | Status: AC
  Administered 2015-09-03: 1 via NASAL
  Filled 2015-09-03: qty 16

## 2015-09-03 MED ORDER — POLYETHYLENE GLYCOL 3350 17 G PO PACK
17.0000 g | PACK | Freq: Every day | ORAL | Status: DC
  Administered 2015-09-03 – 2015-09-04 (×2): 17 g via ORAL
  Filled 2015-09-03 (×3): qty 1

## 2015-09-03 MED ORDER — IPRATROPIUM-ALBUTEROL 0.5-2.5 (3) MG/3ML IN SOLN: 3.0000 mL | Freq: Three times a day (TID) | RESPIRATORY_TRACT | Status: DC

## 2015-09-03 MED ORDER — SODIUM BICARBONATE 650 MG PO TABS
650.0000 mg | ORAL_TABLET | Freq: Three times a day (TID) | ORAL | Status: DC
  Administered 2015-09-03 – 2015-09-05 (×5): 650 mg via ORAL
  Filled 2015-09-03 (×5): qty 1

## 2015-09-03 MED ORDER — POTASSIUM CHLORIDE CRYS ER 20 MEQ PO TBCR
40.0000 meq | EXTENDED_RELEASE_TABLET | Freq: Once | ORAL | Status: AC
  Administered 2015-09-03: 40 meq via ORAL
  Filled 2015-09-03: qty 2

## 2015-09-03 MED ORDER — SALINE SPRAY 0.65 % NA SOLN
1.0000 | NASAL | Status: DC | PRN
  Administered 2015-09-03: 1 via NASAL
  Filled 2015-09-03: qty 44

## 2015-09-03 MED ORDER — DOXYCYCLINE HYCLATE 100 MG PO TABS
100.0000 mg | ORAL_TABLET | Freq: Two times a day (BID) | ORAL | Status: DC
  Administered 2015-09-03 – 2015-09-06 (×6): 100 mg via ORAL
  Filled 2015-09-03 (×6): qty 1

## 2015-09-03 NOTE — Progress Notes (Signed)
Patient Name: Donna Burch Date of Encounter: 09/03/2015  Principal Problem:   Syncope Active Problems:   Recurrent UTI (urinary tract infection)   CLL (chronic lymphocytic leukemia) (HCC)   Hypertension   Hyperlipidemia   GERD (gastroesophageal reflux disease)   Hypothyroidism   Nausea & vomiting   Hyponatremia   Cough   Thyroid activity decreased   Hypomagnesemia   Bradycardia   Symptomatic bradycardia   Junctional escape rhythm   Orthostatic hypotension   Primary Cardiologist: New Patient Profile: 82 y.o. female with no prior history of CAD or syncope, history HTN on diuretics, HLD, GERD, hypothyroidism and CLL (not on treatment), admitted 03/10 with N&V, low Na+, syncope, UTI. Cards saw 03/11 for dizziness in association w/ HR 40-50, junctional rhythm.  SUBJECTIVE: Denies presyncope or dizziness today,   OBJECTIVE Filed Vitals:   09/02/15 1450 09/02/15 1953 09/03/15 0310 09/03/15 0538  BP: 111/58 118/50  138/54  Pulse: 70 86  79  Temp:  98.2 F (36.8 C)  97.5 F (36.4 C)  TempSrc:  Oral    Resp:  20  20  Weight:    140 lb 8 oz (63.73 kg)  SpO2: 99% 97% 99% 99%    Intake/Output Summary (Last 24 hours) at 09/03/15 [id number] Last data filed at 09/03/15 0600  Gross per 24 hour  Intake 2946.25 ml  Output    900 ml  Net 2046.25 ml   Filed Weights   09/02/15 0333 09/03/15 0538  Weight: 134 lb 9.6 oz (61.054 kg) 140 lb 8 oz (63.73 kg)    PHYSICAL EXAM General: Well developed, well nourished, female in no acute distress. Head: Normocephalic, atraumatic.  Neck: Supple without bruits, JVD. Lungs:  Resp regular and unlabored, CTA. Heart: RRR, S1, S2, no S3, S4, or murmur; no rub. Abdomen: Soft, non-tender, non-distended, BS + x 4.  Extremities: No clubbing, cyanosis, edema.  Neuro: Alert and oriented X 3. Moves all extremities spontaneously. Psych: Normal affect.  LABS: CBC: Recent Labs  09/01/15 1454 09/03/15 0359  WBC 11.7* 9.3  HGB 10.9* 9.1*    HCT 31.6* 27.1*  MCV 87.3 89.1  PLT 274 255   INR: Recent Labs  09/02/15 0052  INR 123456   Basic Metabolic Panel: Recent Labs  09/02/15 0956 09/03/15 0359  NA 129* 130*  K 4.0 3.2*  CL 98* 104  CO2 18* 19*  GLUCOSE 123* 102*  BUN 18 13  CREATININE 0.88 0.84  CALCIUM 8.4* 8.0*  MG 1.1* 1.7   Liver Function Tests: Recent Labs  09/01/15 1758  AST 34  ALT 23  ALKPHOS 76  BILITOT 0.4  PROT 6.8  ALBUMIN 3.1*   Cardiac Enzymes: Recent Labs  09/01/15 1758  TROPONINI <0.03   BNP:  B NATRIURETIC PEPTIDE  Date/Time Value Ref Range Status  09/01/2015 05:58 PM 46.2 0.0 - 100.0 pg/mL Final   Fasting Lipid Panel: Recent Labs  09/02/15 0054  CHOL 117  HDL 48  LDLCALC 64  TRIG 27  CHOLHDL 2.4   Thyroid Function Tests: Recent Labs  09/02/15 0053  TSH 5.879*   TELE:  SR, Junctional rhythm high 40s, w/ AV dissociation, no pauses > 2 sec, no HR sustained < 45      Radiology/Studies: Dg Chest 2 View 09/01/2015  CLINICAL DATA:  Weakness EXAM: CHEST  2 VIEW COMPARISON:  12/01/2014 FINDINGS: Heart size within normal limits. Negative for heart failure or edema. Negative for infiltrate effusion or mass. Lungs remain clear Moderately  large hiatal hernia Interval right thyroid surgery. IMPRESSION: No active cardiopulmonary disease. Electronically Signed   By: Franchot Gallo M.D.   On: 09/01/2015 18:16   Ct Head Wo Contrast 09/01/2015  CLINICAL DATA:  82 year old female with several syncopal episodes for the past 5 days, currently being treated for urinary tract infection. EXAM: CT HEAD WITHOUT CONTRAST TECHNIQUE: Contiguous axial images were obtained from the base of the skull through the vertex without intravenous contrast. COMPARISON:  No priors. FINDINGS: Mild cerebral atrophy. Patchy and confluent areas of decreased attenuation are noted throughout the deep and periventricular white matter of the cerebral hemispheres bilaterally, compatible with chronic microvascular  ischemic disease. No acute intracranial abnormalities. Specifically, no evidence of acute intracranial hemorrhage, no definite findings of acute/subacute cerebral ischemia, no mass, mass effect, hydrocephalus or abnormal intra or extra-axial fluid collections. Visualized paranasal sinuses and mastoids are well pneumatized. No acute displaced skull fractures are identified. IMPRESSION: 1. No acute intracranial abnormalities. 2. Mild cerebral atrophy with chronic microvascular ischemic changes in cerebral white matter, as above. Electronically Signed   By: Vinnie Langton M.D.   On: 09/01/2015 18:49     Current Medications:  . aspirin EC  81 mg Oral Daily  . atorvastatin  80 mg Oral q1800  . cefTRIAXone (ROCEPHIN)  IV  1 g Intravenous Q24H  . cholecalciferol  2,000 Units Oral BID  . dextromethorphan-guaiFENesin  1 tablet Oral BID  . enoxaparin (LOVENOX) injection  40 mg Subcutaneous QHS  . levothyroxine  75 mcg Oral QAC breakfast  . magnesium chloride  1 tablet Oral BID  . pantoprazole  40 mg Oral Daily  . pneumococcal 23 valent vaccine  0.5 mL Intramuscular Tomorrow-1000  . potassium chloride  40 mEq Oral Once  . sodium bicarbonate  650 mg Oral BID   . sodium chloride 75 mL/hr at 09/03/15 0442    ASSESSMENT AND PLAN: 1. Principal Problem:   Syncope, with    Symptomatic bradycardia   Junctional escape rhythm   Orthostatic hypotension - Telemetry showed AV dissociation with competing junctional rhythm at the rate in the 40s - Appears to be orthostatic related event, explained with symptoms of dehydration and response to IV fluids - EKG changes might be due to increased vagal tone, patient has good HR variability on telemetry review  - lowest HR was 60s (except the event 03/11) with highest HR in 100s.  - may need event monitor for evaluation of arrhythmia/rhythm disturbances - Due to recent illness and improvement in symptoms with hydration, no PPM for now - Keep potassium >4, calcium  >9, magnesium >2  2. Hypomagnesemia - Mg 1.2 on admission, then 1.1 - was on Slo-mag bid PTA, may need to increase or change to Mag-Ox - per IM  3. Thyroid activity decreased - TSH elevated - hx thyroid nodule - will ck Free T4 as low thyroid would contribute to bradycardia - mgt per IM  Otherwise, per IM Active Problems:   Recurrent UTI (urinary tract infection)   CLL (chronic lymphocytic leukemia) (HCC)   Hypertension   Hyperlipidemia   GERD (gastroesophageal reflux disease)   Hypothyroidism   Nausea & vomiting   Hyponatremia   Cough   Signed, Lenoard Aden 8:33 AM 09/03/2015   Attending Note:   The patient was seen and examined.  Agree with assessment and plan as noted above.  Changes made to the above note as needed.  1. Syncope:  ? Orthostasis. She did have transient AV block  Tele looks great today  Will get echo. Anticipate dC tomorrow if she remains stable    Thayer Headings, Brooke Bonito., MD, [facility] 09/03/2015, 11:38 AM [address]  [place] Pager [phone]

## 2015-09-03 NOTE — Progress Notes (Signed)
.Donna Burch   PROGRESS NOTE  Donna Burch [id number] DOB: 09/18/1926 DOA: 09/01/2015 PCP: Vidal Schwalbe, MD  HPI/Recap of past 24 hours:  C/o sob, increased cough, nasal congestion, she was put on oxygen last night,  No n/v, no ab pain, no diarrhea ,    Assessment/Plan: Principal Problem:   Syncope Active Problems:   Recurrent UTI (urinary tract infection)   CLL (chronic lymphocytic leukemia) (HCC)   Hypertension   Hyperlipidemia   GERD (gastroesophageal reflux disease)   Hypothyroidism   Nausea & vomiting   Hyponatremia   Cough   Thyroid activity decreased   Hypomagnesemia   Bradycardia   Symptomatic bradycardia   Junctional escape rhythm   Orthostatic hypotension  Syncope:  Etiology is not clear. Differential diagnosis include orthostatic status secondary to dehydration due to nausea vomiting, vasovagal syncope, UTI or arrhythmia. No signs of unilateral weakness numbness or tenderness, less likely to have TIA or stroke. Ct head no acute findings. No hypoxia, no chest pain, less likely PE. Patient is clinically significantly dehydrated.  - orthostatic vital signs positive heart rate increased from 81 laying to 99 upon standing. Became significantly orthostatic while walking short distance with PT on 3/11, review tele during this episodes there is bradycardia, ? Few junctional rhythm?  Continue tele. Patient has h/o prednisone allergy ( with significant anxiety), will hold off on stress dose steroids. Consider ted hose and abdominal binder, increase salt intake,  am cortisol level unremarkable.  all home bp meds held for now.  -IV fluid: Normal saline bolus 1.5 Lx1 on admission, followed by maintenance fluids. ivf d/cecd on 3/12 due to sign of fluids overload. -PT/OT   Junctional rhythm and bradycardia: Cardiology consulted due to junctional rhythm with hypotension, concerning arrhythmia induced syncope.echo pending, will likely need event monitor, will follow cardiology  recommendation.  Hyponatremia:  Likely due to combination of decreased oral intake, GI loss from vomiting and home meds HCTZ. -HCTZ held -s/p IVf , ns improving -urine studies in the use of hctz not reliable.  Hypokalemia/Hypomagnesemia: replace k/mag.  Mild metabolic acidosis: on sodium bicarb tabs   Recurrent UTI (urinary tract infection):  she was diagnosed with UTI by PCP 2 days ago. She was given ciprofloxacin, still has symptoms. Patient is not septic on admission.  Pt does not meet criteria for sepsis. Lactate wnl. -switch to IV rocephin -f/u Bx and Ux, no growth, -Procalcitonin less than 0.1, lactic acid trended down from 1.1 on admission to 0.8 after hydration. -due to sign of fluids overload, change iv abx to oral doxycycline  Cough: Patient has been coughing for 3 weeks. Reported sick contact, Initially coughed up yellow color sputum, which become clear. Initial Chest x-ray is negative. Persistent cough, repeat cxr on 3/12 after hydration+ bronchitis. -Mucinex of a cough -flu PCR negative and respiratory virus panel pending -sputum culture, on abx to cover possible bronchitis  Nausea and vomiting: Unclear etiology. Reported vomited once on Thursday, denies acid reflux symptom, denies dysphagia, she reported she coughed too much, possibly cough induced, Patient does not have abdominal pain, could also be due to UTI. -EGD 11/2014; 8 cm hiatal hernia. No distal esophageal stricture. 2. Intermittent tertiary contractions in the distal esophagus lipase mildly elevated, consider Gi consult if symptom persists Lipase elevated? Keep on clears for now, repeat lipase , curently no n/v, no ab pain, will advance diet as tolerated. -Treated UTI as above,  -When necessary Zofran for nausea   CLL (chronic lymphocytic leukemia) ([place]): followed by hematologist  dr. Sullivan Lone. Last seen was on 07/19/15. Per Dr. Lupita Shutter note, pt has CCL of Rai stage 0. No indication for treatment of  her CLL at this time. WBC=11.7 -f/u with hematology   Normocytic Anemia: hgb on presentation 10.7 likely due to hemoconcentration, hgb 9 on 3/12, likely baseline, anemia work up  HLD: Last LDL was not on records -Continue home medications: Lipitor -FLP ldl 64  Hypertension: Now orthostatic hypotension, all home bp meds held  GERD: -Protonix   Hypothyroidism: Last TSH was 0.85 on 10/07/10 -increase Synthroid from 55mcg to 11mcg -TSH 5.8   DVT ppx: SQ Lovenox  Code Status: Full code Family Communication: patient Disposition Plan: not medically ready to be discharged , eventually home in a few days    Consultants:  cardiology  Procedures:  none  Antibiotics:  Rocephin from admission to 3/12  Doxycycline from 3/12   Objective: BP 138/54 mmHg  Pulse 79  Temp(Src) 97.5 F (36.4 C) (Oral)  Resp 20  Wt 63.73 kg (140 lb 8 oz)  SpO2 99%  Intake/Output Summary (Last 24 hours) at 09/03/15 0808 Last data filed at 09/03/15 0600  Gross per 24 hour  Intake 2946.25 ml  Output    900 ml  Net 2046.25 ml   Filed Weights   09/02/15 0333 09/03/15 0538  Weight: 61.054 kg (134 lb 9.6 oz) 63.73 kg (140 lb 8 oz)    Exam:   General:  NAD,well developed but  Thin (reported lost weight when she had c diff a few years ago, never regain it back)  Cardiovascular: RRR  Respiratory: diffuse crackles which is now compare to 3/11  Abdomen: Soft/ND/NT, positive BS  Musculoskeletal: No Edema  Neuro: aaox3  Data Reviewed: Basic Metabolic Panel:  Recent Labs Lab 09/01/15 1454 09/01/15 1758 09/02/15 0956 09/03/15 0359  NA 123*  --  129* 130*  K 3.8  --  4.0 3.2*  CL 92*  --  98* 104  CO2 18*  --  18* 19*  GLUCOSE 142*  --  123* 102*  BUN 24*  --  18 13  CREATININE 0.98  --  0.88 0.84  CALCIUM 9.4  --  8.4* 8.0*  MG  --  1.4* 1.1* 1.7   Liver Function Tests:  Recent Labs Lab 09/01/15 1758  AST 34  ALT 23  ALKPHOS 76  BILITOT 0.4  PROT 6.8  ALBUMIN  3.1*    Recent Labs Lab 09/02/15 0052  LIPASE 74*   No results for input(s): AMMONIA in the last 168 hours. CBC:  Recent Labs Lab 09/01/15 1454 09/03/15 0359  WBC 11.7* 9.3  HGB 10.9* 9.1*  HCT 31.6* 27.1*  MCV 87.3 89.1  PLT 274 255   Cardiac Enzymes:    Recent Labs Lab 09/01/15 1758  TROPONINI <0.03   BNP (last 3 results)  Recent Labs  09/01/15 1758  BNP 46.2    ProBNP (last 3 results) No results for input(s): PROBNP in the last 8760 hours.  CBG:  Recent Labs Lab 09/01/15 2057 09/02/15 0702 09/02/15 1436 09/03/15 0637  GLUCAP 89 80 111* 90    Recent Results (from the past 240 hour(s))  Urine culture     Status: None (Preliminary result)   Collection Time: 09/01/15 10:02 PM  Result Value Ref Range Status   Specimen Description URINE, CLEAN CATCH  Final   Special Requests NONE  Final   Culture NO GROWTH < 12 HOURS  Final   Report Status PENDING  Incomplete     Studies: No results found.  Scheduled Meds: . aspirin EC  81 mg Oral Daily  . atorvastatin  80 mg Oral q1800  . cefTRIAXone (ROCEPHIN)  IV  1 g Intravenous Q24H  . cholecalciferol  2,000 Units Oral BID  . dextromethorphan-guaiFENesin  1 tablet Oral BID  . enoxaparin (LOVENOX) injection  40 mg Subcutaneous QHS  . levothyroxine  75 mcg Oral QAC breakfast  . magnesium chloride  1 tablet Oral BID  . pantoprazole  40 mg Oral Daily  . pneumococcal 23 valent vaccine  0.5 mL Intramuscular Tomorrow-1000  . potassium chloride  40 mEq Oral Once  . sodium bicarbonate  650 mg Oral BID    Continuous Infusions: . sodium chloride 75 mL/hr at 09/03/15 0442     Time spent: 35 mins   Any Mcneice MD, PhD  [facility] Pager [phone]. If 7PM-7AM, please contact night-coverage at [email or web address], password [facility] 09/03/2015, 8:08 AM  LOS: 2 days

## 2015-09-04 ENCOUNTER — Inpatient Hospital Stay (HOSPITAL_COMMUNITY): Payer: Medicare Other

## 2015-09-04 ENCOUNTER — Ambulatory Visit (HOSPITAL_COMMUNITY): Payer: Medicare Other

## 2015-09-04 LAB — RETICULOCYTES
RBC.: 3.15 MIL/uL — AB (ref 3.87–5.11)
Retic Count, Absolute: 34.7 10*3/uL (ref 19.0–186.0)
Retic Ct Pct: 1.1 % (ref 0.4–3.1)

## 2015-09-04 LAB — BASIC METABOLIC PANEL
Anion gap: 5 (ref 5–15)
BUN: 12 mg/dL (ref 6–20)
CHLORIDE: 106 mmol/L (ref 101–111)
CO2: 22 mmol/L (ref 22–32)
CREATININE: 0.83 mg/dL (ref 0.44–1.00)
Calcium: 8.4 mg/dL — ABNORMAL LOW (ref 8.9–10.3)
GFR calc non Af Amer: >60 mL/min (ref >60)
Glucose, Bld: 81 mg/dL (ref 65–99)
POTASSIUM: 4.2 mmol/L (ref 3.5–5.1)
Sodium: 133 mmol/L — ABNORMAL LOW (ref 135–145)

## 2015-09-04 LAB — VITAMIN B12: VITAMIN B 12: 708 pg/mL (ref 180–914)

## 2015-09-04 LAB — RESPIRATORY VIRUS PANEL
ADENOVIRUS: NEGATIVE
INFLUENZA A: NEGATIVE
Influenza B: NEGATIVE
Metapneumovirus: NEGATIVE
Parainfluenza 1: NEGATIVE
Parainfluenza 2: NEGATIVE
Parainfluenza 3: NEGATIVE
RESPIRATORY SYNCYTIAL VIRUS A: NEGATIVE
RESPIRATORY SYNCYTIAL VIRUS B: NEGATIVE
RHINOVIRUS: NEGATIVE

## 2015-09-04 LAB — GLUCOSE, CAPILLARY
GLUCOSE-CAPILLARY: 77 mg/dL (ref 65–99)
GLUCOSE-CAPILLARY: 107 mg/dL — AB (ref 65–99)
GLUCOSE-CAPILLARY: 92 mg/dL (ref 65–99)

## 2015-09-04 LAB — IRON AND TIBC
IRON: 54 ug/dL (ref 28–170)
SATURATION RATIOS: 27 % (ref 10.4–31.8)
TIBC: 203 ug/dL — AB (ref 250–450)
UIBC: 149 ug/dL

## 2015-09-04 LAB — HEMOGLOBIN A1C
HEMOGLOBIN A1C: 6 % — AB (ref 4.8–5.6)
MEAN PLASMA GLUCOSE: 126 mg/dL

## 2015-09-04 LAB — FOLATE: Folate: 27.7 ng/mL (ref >5.9)

## 2015-09-04 LAB — MAGNESIUM: MAGNESIUM: 1.5 mg/dL — AB (ref 1.7–2.4)

## 2015-09-04 LAB — LIPASE, BLOOD: Lipase: 71 U/L — ABNORMAL HIGH (ref 11–51)

## 2015-09-04 MED ORDER — IPRATROPIUM-ALBUTEROL 0.5-2.5 (3) MG/3ML IN SOLN: 3.0000 mL | Freq: Four times a day (QID) | RESPIRATORY_TRACT | Status: DC | PRN

## 2015-09-04 MED ORDER — COSYNTROPIN 0.25 MG IJ SOLR
0.2500 mg | Freq: Once | INTRAMUSCULAR | Status: AC
  Administered 2015-09-05: 0.25 mg via INTRAVENOUS
  Filled 2015-09-04 (×2): qty 0.25

## 2015-09-04 MED ORDER — MAGNESIUM SULFATE 2 GM/50ML IV SOLN
2.0000 g | Freq: Once | INTRAVENOUS | Status: AC
  Administered 2015-09-04: 2 g via INTRAVENOUS
  Filled 2015-09-04: qty 50

## 2015-09-04 MED ORDER — MEGESTROL ACETATE 400 MG/10ML PO SUSP
400.0000 mg | Freq: Every day | ORAL | Status: DC
  Administered 2015-09-04 – 2015-09-06 (×3): 400 mg via ORAL
  Filled 2015-09-04 (×6): qty 10

## 2015-09-04 NOTE — Care Management Important Message (Signed)
Important Message  Patient Details  Name: Donna Burch MRN: [id number] Date of Birth: [birth year]   Medicare Important Message Given:  Yes    Erenest Rasher, RN 09/04/2015, 11:12 AM

## 2015-09-04 NOTE — Progress Notes (Signed)
Patient Name: Donna Burch Date of Encounter: 09/04/2015   Primary Cardiologist: Dr. Harlan Stains  Patient Profile: Donna Burch is a 82 y.o. female with no prior history of CAD or syncope, history HTN on diuretics, HLD, GERD, hypothyroidism and CLL (not on treatment), admitted 03/10 with N&V, low Na+, syncope, UTI. Cards saw 03/11 for dizziness in association w/ HR 40-50, junctional rhythm.  She had Myoview in 2011 that showed no ischemic changes.   SUBJECTIVE: Denies chest pain, SOB, no pre syncope or syncope since admission.  She says she feels fine.  Has only walked one time since admission and did not have any light headiness.    OBJECTIVE Filed Vitals:   09/04/15 0354 09/04/15 0357 09/04/15 0806 09/04/15 0819  BP:  140/50 145/61   Pulse:  79    Temp:  98.2 F (36.8 C)    TempSrc:  Oral    Resp:      Weight: 134 lb 1.6 oz (60.827 kg)     SpO2:  94%  98%    Intake/Output Summary (Last 24 hours) at 09/04/15 1141 Last data filed at 09/04/15 0354  Gross per 24 hour  Intake   1740 ml  Output   1600 ml  Net    140 ml   Filed Weights   09/02/15 0333 09/03/15 0538 09/04/15 0354  Weight: 134 lb 9.6 oz (61.054 kg) 140 lb 8 oz (63.73 kg) 134 lb 1.6 oz (60.827 kg)    PHYSICAL EXAM General: Well developed, well nourished, female in no acute distress. Head: Normocephalic, atraumatic.  Neck: Supple without bruits, No JVD. Lungs:  Resp regular and unlabored, CTA. Heart: RRR, S1, S2, no S3, S4, No murmur; no rub. Abdomen: Soft, non-tender, non-distended, BS + x 4.  Extremities: No clubbing, cyanosis, edema.  Neuro: Alert and oriented X 3. Moves all extremities spontaneously. Psych: Normal affect.  LABS: CBC: Recent Labs  09/01/15 1454 09/03/15 0359  WBC 11.7* 9.3  HGB 10.9* 9.1*  HCT 31.6* 27.1*  MCV 87.3 89.1  PLT 274 255   INR: Recent Labs  09/02/15 0052  INR 123456   Basic Metabolic Panel: Recent Labs  09/03/15 0359 09/04/15 0547  NA 130* 133*  K  3.2* 4.2  CL 104 106  CO2 19* 22  GLUCOSE 102* 81  BUN 13 12  CREATININE 0.84 0.83  CALCIUM 8.0* 8.4*  MG 1.7 1.5*   Liver Function Tests: Recent Labs  09/01/15 1758  AST 34  ALT 23  ALKPHOS 76  BILITOT 0.4  PROT 6.8  ALBUMIN 3.1*   Cardiac Enzymes: Recent Labs  09/01/15 1758  TROPONINI <0.03    Hemoglobin A1C: Recent Labs  09/03/15 0359  HGBA1C 6.0*   Fasting Lipid Panel: Recent Labs  09/02/15 0054  CHOL 117  HDL 48  LDLCALC 64  TRIG 27  CHOLHDL 2.4   Thyroid Function Tests: Recent Labs  09/02/15 0053  TSH 5.879*   Anemia Panel: Recent Labs  09/04/15 0547 09/04/15 0600  VITAMINB12 708  --   FOLATE  --  27.7  TIBC 203*  --   IRON 54  --   RETICCTPCT 1.1  --     TELE: NSR       ECG: NSR with 1 AVB  Echo: Pending  Radiology/Studies: Dg Chest 2 View  09/03/2015  CLINICAL DATA:  82 year old female with history shortness of breath and productive cough. Thick clear mucus production for 1 month. EXAM: CHEST  2 VIEW  COMPARISON:  Chest x-ray 09/01/2015. FINDINGS: Lung volumes are normal. No consolidative airspace disease. With trace bilateral pleural effusions. Very mild diffuse peribronchial cuffing. No pneumothorax. No suspicious appearing pulmonary nodules or masses. No evidence of pulmonary edema. Heart size is normal. Upper mediastinal contours are within normal limits. Large hiatal hernia. Surgical clips on the right side of the thoracic inlet, likely from prior thyroid surgery. IMPRESSION: 1. Mild diffuse peribronchial cuffing suggesting a mild acute bronchitis. 2. Trace bilateral pleural effusions. 3. Atherosclerosis. Electronically Signed   By: Vinnie Langton M.D.   On: 09/03/2015 14:32     Current Medications:  . aspirin EC  81 mg Oral Daily  . atorvastatin  80 mg Oral q1800  . cholecalciferol  2,000 Units Oral BID  . dextromethorphan-guaiFENesin  1 tablet Oral BID  . doxycycline  100 mg Oral Q12H  . enoxaparin (LOVENOX) injection  40  mg Subcutaneous QHS  . ipratropium-albuterol  3 mL Nebulization TID  . levothyroxine  75 mcg Oral QAC breakfast  . magnesium chloride  1 tablet Oral BID  . magnesium sulfate 1 - 4 g bolus IVPB  2 g Intravenous Once  . pantoprazole  40 mg Oral Daily  . polyethylene glycol  17 g Oral Daily  . sodium bicarbonate  650 mg Oral TID      ASSESSMENT AND PLAN: Principal Problem:   Syncope Active Problems:   Recurrent UTI (urinary tract infection)   CLL (chronic lymphocytic leukemia) (HCC)   Hypertension   Hyperlipidemia   GERD (gastroesophageal reflux disease)   Hypothyroidism   Nausea & vomiting   Hyponatremia   Cough   Thyroid activity decreased   Hypomagnesemia   Symptomatic bradycardia   Junctional escape rhythm   Orthostatic hypotension  1. Syncope - Echo pending, will follow to assess for wall motion abnormalities, valve disorders.  - Hyponatremia present on admission, improving 133 today.  - Could wear 30 day event monitor outpatient. - Most likely orthostasis in the setting of dehydration from recent GI illness.  - MD to advise ok to sign off pending echo  Signed, Arbutus Leas , NP 11:41 AM 09/04/2015   I have seen and examined the patient along with Arbutus Leas , NP.  I have reviewed the chart, notes and new data.  I agree with NP's note.  Key new complaints: very shaky when she is hungry Key examination changes: normal exam Key new findings / data: hyponatremia improving; ECG normal except very mild first degree AV block.  PLAN: History most compatible with hypotension as the cause for her syncope. Suspect transient junctional rhythm was caused by vagal response and is unlikely to be a cause of syncope. Plan 30 day event monitor at DC. Still waiting for the echo, but if normal LVEF and no serious valvular abnormalities, the rest of her CV evaluation can be performed as an outpatient.  Sanda Klein, MD, [place] [phone] 09/04/2015, 2:59 PM

## 2015-09-04 NOTE — Progress Notes (Addendum)
.Donna Burch   PROGRESS NOTE  ITHA CAMPTON [id number] DOB: 21-Oct-1926 DOA: 09/01/2015 PCP: Vidal Schwalbe, MD  HPI/Recap of past 24 hours:  Feeling better, very anxious and poor historian, family in room Report some nausea in the morning for the last several month, no vomiting except on Thursday when she has syncope episode,  no ab pain, no diarrhea ,    Assessment/Plan: Principal Problem:   Syncope Active Problems:   Recurrent UTI (urinary tract infection)   CLL (chronic lymphocytic leukemia) (HCC)   Hypertension   Hyperlipidemia   GERD (gastroesophageal reflux disease)   Hypothyroidism   Nausea & vomiting   Hyponatremia   Cough   Thyroid activity decreased   Hypomagnesemia   Symptomatic bradycardia   Junctional escape rhythm   Orthostatic hypotension  Syncope:  Etiology is not clear. Differential diagnosis include orthostatic status secondary to dehydration due to nausea vomiting, vasovagal syncope, UTI or arrhythmia. No signs of unilateral weakness numbness or tenderness, less likely to have TIA or stroke. Ct head no acute findings. No hypoxia, no chest pain, less likely PE. Patient is clinically significantly dehydrated.  - orthostatic vital signs positive heart rate increased from 81 laying to 99 upon standing. Became significantly orthostatic while walking short distance with PT on 3/11, review tele during this episodes there is bradycardia, ? Few junctional rhythm?  Continue tele. Patient has h/o prednisone allergy ( with significant anxiety), will hold off on stress dose steroids. Consider ted hose and abdominal binder, increase salt intake,  am cortisol level unremarkable.  all home bp meds held for now.  -IV fluid: Normal saline bolus 1.5 Lx1 on admission, followed by maintenance fluids. ivf d/cecd on 3/12 due to sign of fluids overload. -PT/OT  Patient's endocrinologist Dr Earline Mayotte called to recommend consytropin stim test to r/o adrenal insufficency, will order baseline  acth level then stim test. Dr Earline Mayotte is available at [phone] for questions. Dr Earline Mayotte would like to see patient this week after discharge. Per dr Earline Mayotte, though prednisone listed as allergy, patient tolerated hydrocortisone in the past.   Junctional rhythm and bradycardia: Cardiology consulted due to junctional rhythm with hypotension, concerning arrhythmia induced syncope.echo pending, will likely need event monitor, will follow cardiology recommendation.  Hyponatremia:  Likely due to combination of decreased oral intake, GI loss from vomiting and home meds HCTZ. -HCTZ held -s/p IVf , ns improving -urine studies in the use of hctz not reliable.  Hypokalemia/Hypomagnesemia: replace k/mag.  Mild metabolic acidosis: on sodium bicarb tabs ,better, will continue for one more day.  Recurrent UTI (urinary tract infection):  she was diagnosed with UTI by PCP 2 days ago. She was given ciprofloxacin, still has symptoms. Patient is not septic on admission.  Pt does not meet criteria for sepsis. Lactate wnl. -switch to IV rocephin -f/u Bx and Ux, no growth, -Procalcitonin less than 0.1, lactic acid trended down from 1.1 on admission to 0.8 after hydration. -due to sign of fluids overload, change iv abx to oral doxycycline  Cough: Patient has been coughing for 3 weeks. Reported sick contact, Initially coughed up yellow color sputum, which become clear. Initial Chest x-ray is negative. Persistent cough, repeat cxr on 3/12 after hydration+ bronchitis. -Mucinex of a cough -flu PCR negative and respiratory virus panel pending -sputum culture, on abx to cover possible bronchitis  Nausea and vomiting: Unclear etiology. Reported vomited once on Thursday, denies acid reflux symptom, denies dysphagia, she reported she coughed too much, possibly cough induced, Patient does not  have abdominal pain, could also be due to UTI. -EGD 11/2014; 8 cm hiatal hernia. No distal esophageal stricture. 2. Intermittent  tertiary contractions in the distal esophagus lipase mildly elevated, consider Gi consult if symptom persists Lipase elevated? Keep on clears for now, curently no n/v, no ab pain, tolerating regular diet, repeat lipase remain mildly elevated, will check abdominal US. -Treated UTI as above,  -has not require any Zofran for nausea in the hospital   CLL (chronic lymphocytic leukemia) [facility]): followed by hematologist dr. Sullivan Lone. Last seen was on 07/19/15. Per Dr. Lupita Shutter note, pt has CCL of Rai stage 0. No indication for treatment of her CLL at this time. WBC=11.7 -f/u with hematology   Normocytic Anemia: hgb on presentation 10.7 likely due to hemoconcentration, hgb 9 on 3/12, likely baseline, anemia work up  HLD: Last LDL was not on records -Continue home medications: Lipitor -FLP ldl 64  Hypertension: Now orthostatic hypotension, all home bp meds held  GERD: -Protonix   Hypothyroidism: Last TSH was 0.85 on 10/07/10 -increase Synthroid from 51mcg to 25mcg -TSH 5.8   DVT ppx: SQ Lovenox  Code Status: Full code Family Communication: patient and her daughter and sister in room Disposition Plan: not medically ready to be discharged , eventually home in a few days    Consultants:  cardiology  Procedures:  none  Antibiotics:  Rocephin from admission to 3/12  Doxycycline from 3/12   Objective: BP 145/61 mmHg  Pulse 79  Temp(Src) 98.2 F (36.8 C) (Oral)  Resp 19  Wt 60.827 kg (134 lb 1.6 oz)  SpO2 98%  Intake/Output Summary (Last 24 hours) at 09/04/15 1219 Last data filed at 09/04/15 0354  Gross per 24 hour  Intake   1740 ml  Output   1600 ml  Net    140 ml   Filed Weights   09/02/15 0333 09/03/15 0538 09/04/15 0354  Weight: 61.054 kg (134 lb 9.6 oz) 63.73 kg (140 lb 8 oz) 60.827 kg (134 lb 1.6 oz)    Exam:   General:  NAD,well developed but  Thin (reported lost weight when she had c diff a few years ago, never regain it  back)  Cardiovascular: RRR  Respiratory: diffuse crackles heard on 3/11 has much improved, no wheezing, no rhonchi  Abdomen: Soft/ND/NT, positive BS  Musculoskeletal: No Edema  Neuro: aaox3  Data Reviewed: Basic Metabolic Panel:  Recent Labs Lab 09/01/15 1454 09/01/15 1758 09/02/15 0956 09/03/15 0359 09/04/15 0547  NA 123*  --  129* 130* 133*  K 3.8  --  4.0 3.2* 4.2  CL 92*  --  98* 104 106  CO2 18*  --  18* 19* 22  GLUCOSE 142*  --  123* 102* 81  BUN 24*  --  18 13 12   CREATININE 0.98  --  0.88 0.84 0.83  CALCIUM 9.4  --  8.4* 8.0* 8.4*  MG  --  1.4* 1.1* 1.7 1.5*   Liver Function Tests:  Recent Labs Lab 09/01/15 1758  AST 34  ALT 23  ALKPHOS 76  BILITOT 0.4  PROT 6.8  ALBUMIN 3.1*    Recent Labs Lab 09/02/15 0052 09/04/15 0547  LIPASE 74* 71*   No results for input(s): AMMONIA in the last 168 hours. CBC:  Recent Labs Lab 09/01/15 1454 09/03/15 0359  WBC 11.7* 9.3  HGB 10.9* 9.1*  HCT 31.6* 27.1*  MCV 87.3 89.1  PLT 274 255   Cardiac Enzymes:    Recent Labs Lab  09/01/15 1758  TROPONINI <0.03   BNP (last 3 results)  Recent Labs  09/01/15 1758  BNP 46.2    ProBNP (last 3 results) No results for input(s): PROBNP in the last 8760 hours.  CBG:  Recent Labs Lab 09/02/15 1436 09/03/15 0637 09/04/15 0606 09/04/15 0653 09/04/15 1207  GLUCAP 111* 90 77 107* 92    Recent Results (from the past 240 hour(s))  Urine culture     Status: None   Collection Time: 09/01/15 10:02 PM  Result Value Ref Range Status   Specimen Description URINE, CLEAN CATCH  Final   Special Requests NONE  Final   Culture MULTIPLE SPECIES PRESENT, SUGGEST RECOLLECTION  Final   Report Status 09/03/2015 FINAL  Final  Culture, blood (routine x 2)     Status: None (Preliminary result)   Collection Time: 09/03/15  1:20 PM  Result Value Ref Range Status   Specimen Description BLOOD LEFT HAND  Final   Special Requests BOTTLES DRAWN AEROBIC ONLY 5CC  Final    Culture NO GROWTH < 24 HOURS  Final   Report Status PENDING  Incomplete  Culture, blood (routine x 2)     Status: None (Preliminary result)   Collection Time: 09/03/15  1:30 PM  Result Value Ref Range Status   Specimen Description BLOOD LEFT HAND  Final   Special Requests BOTTLES DRAWN AEROBIC ONLY 4CC  Final   Culture NO GROWTH < 24 HOURS  Final   Report Status PENDING  Incomplete     Studies: Dg Chest 2 View  09/03/2015  CLINICAL DATA:  82 year old female with history shortness of breath and productive cough. Thick clear mucus production for 1 month. EXAM: CHEST  2 VIEW COMPARISON:  Chest x-ray 09/01/2015. FINDINGS: Lung volumes are normal. No consolidative airspace disease. With trace bilateral pleural effusions. Very mild diffuse peribronchial cuffing. No pneumothorax. No suspicious appearing pulmonary nodules or masses. No evidence of pulmonary edema. Heart size is normal. Upper mediastinal contours are within normal limits. Large hiatal hernia. Surgical clips on the right side of the thoracic inlet, likely from prior thyroid surgery. IMPRESSION: 1. Mild diffuse peribronchial cuffing suggesting a mild acute bronchitis. 2. Trace bilateral pleural effusions. 3. Atherosclerosis. Electronically Signed   By: Vinnie Langton M.D.   On: 09/03/2015 14:32    Scheduled Meds: . aspirin EC  81 mg Oral Daily  . atorvastatin  80 mg Oral q1800  . cholecalciferol  2,000 Units Oral BID  . dextromethorphan-guaiFENesin  1 tablet Oral BID  . doxycycline  100 mg Oral Q12H  . enoxaparin (LOVENOX) injection  40 mg Subcutaneous QHS  . ipratropium-albuterol  3 mL Nebulization TID  . levothyroxine  75 mcg Oral QAC breakfast  . magnesium chloride  1 tablet Oral BID  . magnesium sulfate 1 - 4 g bolus IVPB  2 g Intravenous Once  . pantoprazole  40 mg Oral Daily  . polyethylene glycol  17 g Oral Daily  . sodium bicarbonate  650 mg Oral TID    Continuous Infusions:     Time spent: 35 mins   Azaylah Stailey  MD, PhD  [facility] Pager [phone]. If 7PM-7AM, please contact night-coverage at [email or web address], password [facility] 09/04/2015, 12:19 PM  LOS: 3 days

## 2015-09-04 NOTE — Evaluation (Signed)
Occupational Therapy Evaluation Patient Details Name: Donna Burch MRN: [id number] DOB: February 21, 1927 Today's Date: 09/04/2015    History of Present Illness 81 y.o. female with PMH of CLL, hypertension, hyperlipidemia, GERD, hypothyroidism, kidney stone, C. difficile colitis, skin cancer, recurrent UTI, arthritis, who presents with cough, generalized weakness, nausea, vomiting, increased urinary frequency, syncope, hyponatremia   Clinical Impression   Pt is an 82 y/o female admitted as above currently demonstrating deficits in her ability to perform ADL's and self care (see OT problem list below). She should benefit from acute OT to assist in maximizing independence with ADL's prior to anticipated d/c home as pt was independent PLOF and lives alone.    Follow Up Recommendations  Home health OT;Supervision - Intermittent    Equipment Recommendations   (Cont to monitor for needs. Pt states that she has toilet riser)    Recommendations for Other Services       Precautions / Restrictions Precautions Precautions: Fall Precaution Comments: telemetry Restrictions Weight Bearing Restrictions: No      Mobility Bed Mobility Overal bed mobility: Modified Independent                Transfers Overall transfer level: Needs assistance Equipment used: Rolling walker (2 wheeled) Transfers: Sit to/from [facility] Sit to Stand: Min assist Stand pivot transfers: Supervision       General transfer comment: Min assist sit to stand from toilet and bed this am.     Balance Overall balance assessment: Needs assistance Sitting-balance support: Bilateral upper extremity supported;Feet supported Sitting balance-Leahy Scale: Good     Standing balance support: Bilateral upper extremity supported Standing balance-Leahy Scale: Fair                              ADL Overall ADL's : Needs assistance/impaired     Grooming: Wash/dry hands;Wash/dry face;Set  up;Sitting   Upper Body Bathing: Set up;Sitting   Lower Body Bathing: Minimal assistance;Sit to/from stand   Upper Body Dressing : Set up;Sitting   Lower Body Dressing: Minimal assistance;Sit to/from stand   Toilet Transfer: RW;Ambulation;Grab bars;Minimal assistance Toilet Transfer Details (indicate cue type and reason): Pt would benefit from 3:1 over toilet for increased ease with sit to stand. Toileting- Water quality scientist and Hygiene: Min guard;Sit to/from stand       Functional mobility during ADLs: Minimal assistance;Rolling walker General ADL Comments: Pt presents with deficits with ADL and self care tasks s/p recent admit. Monitor HR/BP during functional mobility as pt was with noted decreased HR after PT on 09/02/15. Check Orthostatic Vitals. Pt appears very fatigued during toileting tasks and should benefit from acute OT to assist in maximizing independence with ADL's for anticipated d/c home. Rec HHOT and PRN supervision at this time.     Vision  Wears glasses at all times. No change from baseline.   Perception     Praxis      Pertinent Vitals/Pain Pain Assessment: Faces Faces Pain Scale: Hurts a little bit Pain Location: Right flank (My Kidney) Pain Descriptors / Indicators: Tender Pain Intervention(s): Limited activity within patient's tolerance;Monitored during session;Repositioned     Hand Dominance Right   Extremity/Trunk Assessment Upper Extremity Assessment Upper Extremity Assessment: Overall WFL for tasks assessed   Lower Extremity Assessment Lower Extremity Assessment: Defer to PT evaluation;Generalized weakness   Cervical / Trunk Assessment Cervical / Trunk Assessment: Normal   Communication Communication Communication: HOH   Cognition Arousal/Alertness: Awake/alert Behavior During Therapy:  WFL for tasks assessed/performed Overall Cognitive Status: Within Functional Limits for tasks assessed                     General Comments        Exercises       Shoulder Instructions      Home Living Family/patient expects to be discharged to:: Private residence Living Arrangements: Alone Available Help at Discharge: Family;Available PRN/intermittently Type of Home: House Home Access: Stairs to enter Entrance Stairs-Number of Steps: 1+1 Entrance Stairs-Rails: None Home Layout: One level     Bathroom Shower/Tub: Tub/shower unit (Unclear- Pt states tub then states shower. "I don't have a hard time with that" ) Shower/tub characteristics: Curtain       Home Equipment: Cane - single point;Toilet riser          Prior Functioning/Environment Level of Independence: Independent             OT Diagnosis: Generalized weakness;Other (comment) (Cardiac issues)   OT Problem List: Decreased activity tolerance;Decreased knowledge of precautions;Decreased knowledge of use of DME or AE;Cardiopulmonary status limiting activity;Pain   OT Treatment/Interventions: Self-care/ADL training;Energy conservation;DME and/or AE instruction;Patient/family education;Therapeutic activities    OT Goals(Current goals can be found in the care plan section) Acute Rehab OT Goals Patient Stated Goal: to be able to get home safely Time For Goal Achievement: 09/18/15 Potential to Achieve Goals: Good ADL Goals Pt Will Perform Grooming: with modified independence;sitting Pt Will Perform Lower Body Bathing: with modified independence;sit to/from stand;with adaptive equipment Pt Will Perform Lower Body Dressing: with modified independence;with adaptive equipment;sit to/from stand Pt Will Transfer to Toilet: with modified independence;ambulating;bedside commode Pt Will Perform Toileting - Clothing Manipulation and hygiene: with modified independence;sit to/from stand Pt Will Perform Tub/Shower Transfer: Tub transfer;Shower transfer;ambulating;with min guard assist;shower seat;grab bars;rolling walker  OT Frequency: Min 2X/week   Barriers to  D/C: Other (comment)  Lives alone       Co-evaluation              End of Session Equipment Utilized During Treatment: Gait belt;Rolling walker  Activity Tolerance: Patient tolerated treatment well;Patient limited by lethargy Patient left: in bed;with call bell/phone within reach;with bed alarm set;Other (comment) (With Respiratory Therapy )   Time: [id number] OT Time Calculation (min): 17 min Charges:  OT General Charges $OT Visit: 1 Procedure OT Evaluation $OT Eval Moderate Complexity: 1 Procedure G-Codes:    Almyra Deforest, OTR/L 09/04/2015, 8:42 AM

## 2015-09-05 ENCOUNTER — Other Ambulatory Visit: Payer: Self-pay | Admitting: Cardiology

## 2015-09-05 ENCOUNTER — Inpatient Hospital Stay (HOSPITAL_COMMUNITY): Payer: Medicare Other

## 2015-09-05 DIAGNOSIS — R55 Syncope and collapse: Secondary | ICD-10-CM

## 2015-09-05 LAB — ACTH STIMULATION, 3 TIME POINTS
CORTISOL BASE: 3.4 ug/dL
Cortisol, 30 Min: 16.8 ug/dL
Cortisol, 60 Min: 21.1 ug/dL

## 2015-09-05 LAB — CBC
HEMATOCRIT: 30.1 % — AB (ref 36.0–46.0)
Hemoglobin: 10 g/dL — ABNORMAL LOW (ref 12.0–15.0)
MCH: 29.9 pg (ref 26.0–34.0)
MCHC: 33.2 g/dL (ref 30.0–36.0)
MCV: 89.9 fL (ref 78.0–100.0)
PLATELETS: 255 10*3/uL (ref 150–400)
RBC: 3.35 MIL/uL — ABNORMAL LOW (ref 3.87–5.11)
RDW: 13.7 % (ref 11.5–15.5)
WBC: 10.3 10*3/uL (ref 4.0–10.5)

## 2015-09-05 LAB — GLUCOSE, CAPILLARY: GLUCOSE-CAPILLARY: 86 mg/dL (ref 65–99)

## 2015-09-05 LAB — HEPATIC FUNCTION PANEL
ALBUMIN: 2.8 g/dL — AB (ref 3.5–5.0)
ALK PHOS: 69 U/L (ref 38–126)
ALT: 25 U/L (ref 14–54)
AST: 32 U/L (ref 15–41)
BILIRUBIN DIRECT: 0.1 mg/dL (ref 0.1–0.5)
BILIRUBIN TOTAL: 0.5 mg/dL (ref 0.3–1.2)
Indirect Bilirubin: 0.4 mg/dL (ref 0.3–0.9)
Total Protein: 6 g/dL — ABNORMAL LOW (ref 6.5–8.1)

## 2015-09-05 LAB — BASIC METABOLIC PANEL
Anion gap: 11 (ref 5–15)
BUN: 16 mg/dL (ref 6–20)
CALCIUM: 8.9 mg/dL (ref 8.9–10.3)
CO2: 23 mmol/L (ref 22–32)
CREATININE: 1 mg/dL (ref 0.44–1.00)
Chloride: 99 mmol/L — ABNORMAL LOW (ref 101–111)
GFR, EST AFRICAN AMERICAN: 57 mL/min — AB (ref >60)
GFR, EST NON AFRICAN AMERICAN: 50 mL/min — AB (ref >60)
Glucose, Bld: 100 mg/dL — ABNORMAL HIGH (ref 65–99)
Potassium: 4.4 mmol/L (ref 3.5–5.1)
SODIUM: 133 mmol/L — AB (ref 135–145)

## 2015-09-05 LAB — ECHOCARDIOGRAM COMPLETE: Weight: 2142.4 oz

## 2015-09-05 LAB — LIPASE, BLOOD: Lipase: 73 U/L — ABNORMAL HIGH (ref 11–51)

## 2015-09-05 LAB — MAGNESIUM: MAGNESIUM: 1.3 mg/dL — AB (ref 1.7–2.4)

## 2015-09-05 MED ORDER — SENNOSIDES-DOCUSATE SODIUM 8.6-50 MG PO TABS
2.0000 | ORAL_TABLET | Freq: Two times a day (BID) | ORAL | Status: DC
  Administered 2015-09-05 – 2015-09-06 (×3): 2 via ORAL
  Filled 2015-09-05 (×3): qty 2

## 2015-09-05 MED ORDER — MAGNESIUM SULFATE 2 GM/50ML IV SOLN
2.0000 g | Freq: Once | INTRAVENOUS | Status: AC
  Administered 2015-09-05: 2 g via INTRAVENOUS
  Filled 2015-09-05: qty 50

## 2015-09-05 MED ORDER — AMLODIPINE BESYLATE 2.5 MG PO TABS
2.5000 mg | ORAL_TABLET | Freq: Every day | ORAL | Status: DC
  Administered 2015-09-05 – 2015-09-06 (×2): 2.5 mg via ORAL
  Filled 2015-09-05 (×2): qty 1

## 2015-09-05 MED ORDER — ENSURE ENLIVE PO LIQD
237.0000 mL | ORAL | Status: DC
  Administered 2015-09-05: 237 mL via ORAL

## 2015-09-05 MED ORDER — SODIUM CHLORIDE 1 G PO TABS
1.0000 g | ORAL_TABLET | Freq: Two times a day (BID) | ORAL | Status: DC
  Administered 2015-09-05 – 2015-09-06 (×2): 1 g via ORAL
  Filled 2015-09-05 (×3): qty 1

## 2015-09-05 NOTE — Progress Notes (Signed)
  Echocardiogram 2D Echocardiogram has been performed.  Donna Burch 09/05/2015, 10:25 AM

## 2015-09-05 NOTE — Progress Notes (Addendum)
Donna Burch   PROGRESS NOTE  Donna Burch [id number] DOB: [birth year] DOA: 09/01/2015 PCP: Vidal Schwalbe, MD  Brief Summary:  Hx of CLL, HTN, recurrent UTI, HLD, who presents with cough, generalized weakness, nausea, vomiting, increased urinary frequency, syncope, hyponatremia, orthostatic hypotension, IVF, culture negative, negative flu pcr/respiratory viral panel. Brief junctional rhythm, bradycardia with hypotension 3/11 pm , cardiology consulted. Echo unremarkable. All home bp meds held since admission, restarted bp meds gradually since 3/14. Mild elevation of lipase of unclear etiology, + gallstone, no ab pain, does has poor appetite. Passing gallstones intermittently? outpatient GI follow up. Plan to discharge home on 3/15 with home health, patient is advised not to drive until cleared by pmd.  Patient is to close follow up with endocrinologist Dr Earline Mayotte this week. (please see Dr Esmond Harps contact info listed below)     HPI/Recap of past 24 hours:  Feeling better, very anxious and poor historian, family not in room  no ab pain, has chronic constipation, reported poor appetite and persistent generalized weakness.   Assessment/Plan: Principal Problem:   Syncope Active Problems:   Recurrent UTI (urinary tract infection)   CLL (chronic lymphocytic leukemia) (HCC)   Hypertension   Hyperlipidemia   GERD (gastroesophageal reflux disease)   Hypothyroidism   Nausea & vomiting   Hyponatremia   Cough   Thyroid activity decreased   Hypomagnesemia   Symptomatic bradycardia   Junctional escape rhythm   Orthostatic hypotension  Syncope/orthosatic hypotension/junctional bradycardia:  Etiology is not clear. Differential diagnosis include orthostatic status secondary to dehydration due to nausea vomiting, vasovagal syncope, UTI or arrhythmia. CT head no acute findings. No hypoxia, no chest pain, less likely PE. Patient is clinically significantly dehydrated.  -  Became significantly  orthostatic after walking short distance with PT on 3/11, tele during this episode revealed  bradycardia, ? Few junctional rhythm?  Patient started ted hose and salt tabs,  am cortisol level unremarkable.  all home bp meds held since admission, restarted norvasc at lower dose on 3/14  -IV fluid: Normal saline bolus 1.5 Lx1 on admission, followed by maintenance fluids. ivf d/cecd on 3/12 due to sign of fluids overload. -PT/OT  -Patient's endocrinologist Dr Earline Mayotte called to recommend consytropin stim test to r/o adrenal insufficency, baseline ACTH and cortisol  level and stim test ordered, there is an appropriate increase of cortisol with stim test.   Dr Earline Mayotte is available at [phone] for questions. Dr Earline Mayotte would like to see patient this week after discharge. Per dr Earline Mayotte, though prednisone listed as allergy, patient tolerated hydrocortisone in the past. Per patient h/o prednisone allergy ( with significant anxiety), will defer trial of hydrocortisone to Dr Earline Mayotte at follow up  Junctional rhythm and bradycardia: Cardiology consulted due to junctional rhythm with hypotension, concerning arrhythmia induced syncope.echo unremarkable, cardiology will arrange outpatient monitoring.  Hyponatremia:  Likely due to combination of decreased oral intake, GI loss from vomiting and home meds HCTZ. -HCTZ held -s/p IVF , ns improving, on salt tabs. -urine studies in the use of hctz not reliable.  Hypokalemia/Hypomagnesemia: replace k/mag.  Mild metabolic acidosis: resolved with  sodium bicarb tabs.   Recurrent UTI (urinary tract infection):  she was diagnosed with UTI by PCP 2 days ago. She was given ciprofloxacin, still has symptoms. Patient is not septic on admission.  Pt does not meet criteria for sepsis. Lactate wnl. -f/u Bx and Ux, no growth, -Procalcitonin less than 0.1, lactic acid trended down from 1.1 on admission to 0.8 after hydration. -  was started on rocephin on admission, due to sign of fluids  overload, change iv abx to oral doxycycline  Cough: Patient has been coughing for 3 weeks. Reported sick contact, Initially coughed up yellow color sputum, which become clear. Initial Chest x-ray is negative. Persistent cough, repeat cxr on 3/12 after hydration+ bronchitis. -Mucinex of a cough -flu PCR negative and respiratory virus panel negative -sputum sample not collected, on abx to cover possible bronchitis  Nausea and vomiting: Unclear etiology. Reported vomited x1 on Thursday, denies acid reflux symptom, denies dysphagia, she reported she coughed too much, possibly cough induced, Patient does not have abdominal pain, could also be due to UTI. -EGD 11/2014; 8 cm hiatal hernia. No distal esophageal stricture. 2. Intermittent tertiary contractions in the distal esophagus  -Lipase elevated?  curently no n/v, no ab pain, tolerating regular diet, repeat lipase remain mildly elevated, abdominal US reveal gallstones, passing gallstones? Need outpatient gi follow up. -has not required any Zofran for nausea in the hospital   Hypertension: orthostatic hypotension on admission, all home bp meds held since admission, restarted low dose norvasc on 3/14. Continue to adjust bp meds.   CLL (chronic lymphocytic leukemia) [facility]): followed by hematologist dr. Sullivan Lone. Last seen was on 07/19/15. Per Dr. Lupita Shutter note, pt has CCL of Rai stage 0. No indication for treatment of her CLL at this time. WBC=11.7 -f/u with hematology   Normocytic Anemia: hgb on presentation 10.7 likely due to hemoconcentration, hgb 9 on 3/12, likely baseline, anemia work up with normal b12/folate/ iron level/FOBt pending. recit count inappropriately low.  HLD:  -Continue home medications: Lipitor -FLP ldl 64   GERD: -Protonix   Hypothyroidism: Last TSH was 0.85 on 10/07/10 -increase Synthroid from 56mcg to 7mcg -TSH 5.8 -pmd to repeat tsh in 4-6 weeks.   DVT ppx: SQ Lovenox  Code Status: Full code Family  Communication: patient Disposition Plan: home with home health , likely 3/15    Consultants:  cardiology  Procedures:  none  Antibiotics:  Rocephin from admission to 3/12  Doxycycline from 3/12   Objective: BP 137/59 mmHg  Pulse 74  Temp(Src) 98.6 F (37 C) (Oral)  Resp 19  Wt 60.737 kg (133 lb 14.4 oz)  SpO2 96%  Intake/Output Summary (Last 24 hours) at 09/05/15 1314 Last data filed at 09/05/15 1248  Gross per 24 hour  Intake   1080 ml  Output    500 ml  Net    580 ml   Filed Weights   09/03/15 0538 09/04/15 0354 09/05/15 0651  Weight: 63.73 kg (140 lb 8 oz) 60.827 kg (134 lb 1.6 oz) 60.737 kg (133 lb 14.4 oz)    Exam:   General:  NAD,well developed but  Thin (reported lost weight when she had c diff a few years ago, never regain it back), very anxious, poor historian.  Cardiovascular: RRR  Respiratory: diffuse crackles heard on 3/11 has largely resolved, no wheezing, no rhonchi  Abdomen: Soft/ND/NT, positive BS  Musculoskeletal: No Edema  Neuro: aaox3  Data Reviewed: Basic Metabolic Panel:  Recent Labs Lab 09/01/15 1454 09/01/15 1758 09/02/15 0956 09/03/15 0359 09/04/15 0547 09/05/15 0658  NA 123*  --  129* 130* 133* 133*  K 3.8  --  4.0 3.2* 4.2 4.4  CL 92*  --  98* 104 106 99*  CO2 18*  --  18* 19* 22 23  GLUCOSE 142*  --  123* 102* 81 100*  BUN 24*  --  18 13 12  16  CREATININE 0.98  --  0.88 0.84 0.83 1.00  CALCIUM 9.4  --  8.4* 8.0* 8.4* 8.9  MG  --  1.4* 1.1* 1.7 1.5* 1.3*   Liver Function Tests:  Recent Labs Lab 09/01/15 1758 09/05/15 0658  AST 34 32  ALT 23 25  ALKPHOS 76 69  BILITOT 0.4 0.5  PROT 6.8 6.0*  ALBUMIN 3.1* 2.8*    Recent Labs Lab 09/02/15 0052 09/04/15 0547 09/05/15 0658  LIPASE 74* 71* 73*   No results for input(s): AMMONIA in the last 168 hours. CBC:  Recent Labs Lab 09/01/15 1454 09/03/15 0359 09/05/15 0658  WBC 11.7* 9.3 10.3  HGB 10.9* 9.1* 10.0*  HCT 31.6* 27.1* 30.1*  MCV 87.3  89.1 89.9  PLT 274 255 255   Cardiac Enzymes:    Recent Labs Lab 09/01/15 1758  TROPONINI <0.03   BNP (last 3 results)  Recent Labs  09/01/15 1758  BNP 46.2    ProBNP (last 3 results) No results for input(s): PROBNP in the last 8760 hours.  CBG:  Recent Labs Lab 09/03/15 0637 09/04/15 0606 09/04/15 0653 09/04/15 1207 09/05/15 0657  GLUCAP 90 77 107* 92 86    Recent Results (from the past 240 hour(s))  Urine culture     Status: None   Collection Time: 09/01/15 10:02 PM  Result Value Ref Range Status   Specimen Description URINE, CLEAN CATCH  Final   Special Requests NONE  Final   Culture MULTIPLE SPECIES PRESENT, SUGGEST RECOLLECTION  Final   Report Status 09/03/2015 FINAL  Final  Respiratory virus panel     Status: None   Collection Time: 09/01/15 11:40 PM  Result Value Ref Range Status   Source - RVPAN NASAL SWAB  Corrected   Respiratory Syncytial Virus A Negative Negative Final   Respiratory Syncytial Virus B Negative Negative Final   Influenza A Negative Negative Final   Influenza B Negative Negative Final   Parainfluenza 1 Negative Negative Final   Parainfluenza 2 Negative Negative Final   Parainfluenza 3 Negative Negative Final   Metapneumovirus Negative Negative Final   Rhinovirus Negative Negative Final   Adenovirus Negative Negative Final    Comment: (NOTE) Performed At: [facility] [place], [place] [id number] Lindon Romp MD [id number]   Culture, blood (routine x 2)     Status: None (Preliminary result)   Collection Time: 09/03/15  1:20 PM  Result Value Ref Range Status   Specimen Description BLOOD LEFT HAND  Final   Special Requests BOTTLES DRAWN AEROBIC ONLY 5CC  Final   Culture NO GROWTH 2 DAYS  Final   Report Status PENDING  Incomplete  Culture, blood (routine x 2)     Status: None (Preliminary result)   Collection Time: 09/03/15  1:30 PM  Result Value Ref Range Status   Specimen Description BLOOD LEFT  HAND  Final   Special Requests BOTTLES DRAWN AEROBIC ONLY 4CC  Final   Culture NO GROWTH 2 DAYS  Final   Report Status PENDING  Incomplete     Studies: US Abdomen Complete  09/04/2015  CLINICAL DATA:  82 year old with personal history of recurrent urinary tract infections, current history of CLL, presenting with episodes of nausea and vomiting over the past several months and elevated lipase on recent laboratory evaluation. EXAM: ABDOMEN ULTRASOUND COMPLETE COMPARISON:  Urinary tract ultrasound 06/27/2014 [facility]. CT abdomen and pelvis 03/25/2014 and earlier. FINDINGS: Gallbladder: Multiple mobile echogenic gallstones, the largest measuring about  5 mm. No gallbladder wall thickening or pericholecystic fluid. Negative sonographic Murphy sign according to the ultrasound technologist. Common bile duct: Diameter: Approximately 3 mm centrally. Distal duct obscured by duodenal bowel gas. Liver: Normal size and echotexture without focal parenchymal abnormality. Patent portal vein with hepatopetal flow. IVC: Patent in its intrahepatic portion. Obscured outside the liver by bowel gas. Pancreas: Not visualized due to overlying bowel gas. Spleen: Normal size and echotexture without focal parenchymal abnormality. Right Kidney: Length: Approximately 12.6 cm (measurement includes a lower pole cyst as noted on the prior examinations). Approximate 5 cm simple cyst arising from the lower pole the right kidney, increased in size since 2015. No significant focal renal parenchymal abnormality. Mild diffuse cortical thinning as noted previously. No evidence of hydronephrosis. Left Kidney: Length: Approximately 9.9 cm. Dilated renal pelvis as noted previously. No calyceal dilation. No focal parenchymal abnormality. Mild diffuse cortical thinning as noted previously. Abdominal aorta: Obscured in its mid to distal portion by bowel gas. Moderate atherosclerosis involving the proximal aorta with maximum diameter  approximately 2.2 cm. Other findings: None. IMPRESSION: 1. Cholelithiasis without sonographic evidence of acute cholecystitis. 2. Nonvisualization of the pancreas due to overlying bowel gas. 3. Chronic left UPJ stenosis accounting for a dilated left renal pelvis, unchanged dating back to 2012. 4. Mild diffuse cortical thinning involving both kidneys as noted previously. 5. Extrahepatic IVC and mid and distal abdominal aorta obscured by overlying bowel gas and therefore not evaluated. Electronically Signed   By: Evangeline Dakin M.D.   On: 09/04/2015 15:47    Scheduled Meds: . aspirin EC  81 mg Oral Daily  . atorvastatin  80 mg Oral q1800  . cholecalciferol  2,000 Units Oral BID  . dextromethorphan-guaiFENesin  1 tablet Oral BID  . doxycycline  100 mg Oral Q12H  . enoxaparin (LOVENOX) injection  40 mg Subcutaneous QHS  . levothyroxine  75 mcg Oral QAC breakfast  . magnesium chloride  1 tablet Oral BID  . magnesium sulfate 1 - 4 g bolus IVPB  2 g Intravenous Once  . megestrol  400 mg Oral Daily  . pantoprazole  40 mg Oral Daily  . senna-docusate  2 tablet Oral BID  . sodium bicarbonate  650 mg Oral TID    Continuous Infusions:     Time spent: 35 mins   Pleasant Bensinger MD, PhD  [facility] Pager [phone]. If 7PM-7AM, please contact night-coverage at [email or web address], password [facility] 09/05/2015, 1:14 PM  LOS: 4 days

## 2015-09-05 NOTE — Care Management Note (Addendum)
Case Management Note  Patient Details  Name: Donna Burch MRN: [id number] Date of Birth: 1927-03-21  Subjective/Objective:   Pt lives alone, states cousin may come from [place] to stay with her for several days.  Agrees to home health nursing and physical therapy.  Provided list of agencies and pt wants to wait until her dtr arrives after work and get her to make a choice.  CM will f/u in the morning.                        Expected Discharge Plan:  [place]  In-House Referral:     Discharge planning Services  CM Consult  Post Acute Care Choice:  Home Health Choice offered to:  Patient  DME Arranged:    DME Agency:     HH Arranged:  RN, PT [place] Agency:     Status of Service:  In process, will continue to follow  Medicare Important Message Given:  Yes Date Medicare IM Given:    Medicare IM give by:    Date Additional Medicare IM Given:    Additional Medicare Important Message give by:     If discussed at [place] of Stay Meetings, dates discussed:    Additional CommentsGirard Cooter, RN 09/05/2015, 4:27 PM  09/06/15 0817:  [place], CM NOTIFIED LIAISON.  WILL NEED F2F AND HOME HEALTH ORDERS FOR PT, OT, RN.

## 2015-09-05 NOTE — Progress Notes (Signed)
OT Cancellation Note  Patient Details Name: Donna Burch MRN: [id number] DOB: [birth year]   Cancelled Treatment:    Reason Eval/Treat Not Completed: Other (comment) (fatigue s/p PT session- Ot reattempt later)  Vonita Moss   OTR/L Pager: [phone] Office: [phone] .  09/05/2015, 11:12 AM

## 2015-09-05 NOTE — Progress Notes (Signed)
   Echo findings reviewed. Normal LVEF of 65-70%. No WMA nor significant valve abnormalities. Given normal echo, the remainder of her evaluation can be performed as an outpatient. 30 day event monitor has been ordered. Our office will contact the patient with appt date and time to get fitted for monitor. Please call if any additional needs.    SIMMONS, BRITTAINY 09/05/2015  Agree. Sanda Klein, MD, [facility] [phone] office [phone] pager

## 2015-09-05 NOTE — Progress Notes (Signed)
Pt took off from Droplet precaution, respiratory panel virus test final result  was negative.

## 2015-09-05 NOTE — Progress Notes (Signed)
Initial Nutrition Assessment   INTERVENTION:  Ensure Enlive po once daily, each supplement provides 350 kcal and 20 grams of protein Encourage adequate healthful PO intake  NUTRITION DIAGNOSIS:   Predicted suboptimal nutrient intake related to poor appetite as evidenced by per patient/family report.   GOAL:   Patient will meet greater than or equal to 90% of their needs   MONITOR:   PO intake, Supplement acceptance, Labs, Skin, I & O's, Weight trends  REASON FOR ASSESSMENT:   Consult Assessment of nutrition requirement/status  ASSESSMENT:   82 y.o. female with PMH of CLL, hypertension, hyperlipidemia, GERD, hypothyroidism, kidney stone, C. difficile colitis, skin cancer, recurrent UTI, arthritis, who presents with cough, generalized weakness, nausea, vomiting, increased urinary frequency, syncope, hyponatremia.  Pt reports that she has not had much of an appetite for the past couple months after having to euthanize her dog. She doesn't get hungry but, she forces herself to eat breakfast and dinner, sometimes lunch. When asked if she eats protein-rich foods she states that her daughter has been encouraging her to eat more and has gotten her protein bars. Pt likes Ensure and is interested in that as well. She reports eating 75% of lunch. Meal completion has been 65-100% per nursing notes. Pt denies nausea. RD encouraged adequate healthful PO intake throughout the day.   Labs: low sodium, low magensium  Diet Order:  Diet regular Room service appropriate?: Yes; Fluid consistency:: Thin  Skin:  Reviewed, no issues  Last BM:  3/13  Height:   Ht Readings from Last 1 Encounters:  09/05/15 5' 3.5" (1.613 m)    Weight:   Wt Readings from Last 1 Encounters:  09/05/15 133 lb 14.4 oz (60.737 kg)    Ideal Body Weight:  53.4 kg  BMI:  Body mass index is 23.34 kg/(m^2).  Estimated Nutritional Needs:   Kcal:  1400-1600  Protein:  70-80 grams  Fluid:  1.4-1.6  L/day  EDUCATION NEEDS:   No education needs identified at this time  [place], LDN Inpatient Clinical Dietitian Pager: [phone] After Hours Pager: [phone]

## 2015-09-05 NOTE — Progress Notes (Signed)
Physical Therapy Treatment Patient Details Name: Donna Burch MRN: [id number] DOB: [birth year] Today's Date: 09/05/2015    History of Present Illness 82 y.o. female with PMH of CLL, hypertension, hyperlipidemia, GERD, hypothyroidism, kidney stone, C. difficile colitis, skin cancer, recurrent UTI, arthritis, who presents with cough, generalized weakness, nausea, vomiting, increased urinary frequency, syncope, hyponatremia    PT Comments    Upon return to bed following mobility, pt BP 146/59.  Follow Up Recommendations  Home health PT;Supervision - Intermittent     Equipment Recommendations  None recommended by PT    Recommendations for Other Services       Precautions / Restrictions Precautions Precautions: Fall    Mobility  Bed Mobility Overal bed mobility: Modified Independent                Transfers   Equipment used: None   Sit to Stand: Supervision Stand pivot transfers: Supervision       General transfer comment: supervision for safety only  Ambulation/Gait Ambulation/Gait assistance: Min guard Ambulation Distance (Feet): 50 Feet   Gait Pattern/deviations: Step-through pattern;Decreased stride length Gait velocity: mildly decreased Gait velocity interpretation: Below normal speed for age/gender General Gait Details: Gait limited to in room for ADLs due to fatigue. Pt declining hallway ambulation stating she is too tired from all the testing this morning.   Stairs            Wheelchair Mobility    Modified Rankin (Stroke Patients Only)       Balance                                    Cognition Arousal/Alertness: Awake/alert Behavior During Therapy: WFL for tasks assessed/performed Overall Cognitive Status: Within Functional Limits for tasks assessed                      Exercises      General Comments        Pertinent Vitals/Pain Pain Assessment: No/denies pain    Home Living                      Prior Function            PT Goals (current goals can now be found in the care plan section) Acute Rehab PT Goals Patient Stated Goal: to be able to get home safely PT Goal Formulation: With patient/family Time For Goal Achievement: 09/16/15 Potential to Achieve Goals: Good Progress towards PT goals: Progressing toward goals    Frequency  Min 3X/week    PT Plan Current plan remains appropriate    Co-evaluation             End of Session   Activity Tolerance: Patient limited by fatigue Patient left: in bed;with call bell/phone within reach     Time: [id number] PT Time Calculation (min) (ACUTE ONLY): 16 min  Charges:  $Therapeutic Activity: 8-22 mins                    G Codes:      Lorriane Shire 09/05/2015, 10:28 AM

## 2015-09-06 DIAGNOSIS — E871 Hypo-osmolality and hyponatremia: Secondary | ICD-10-CM

## 2015-09-06 LAB — BASIC METABOLIC PANEL
ANION GAP: 9 (ref 5–15)
BUN: 22 mg/dL — ABNORMAL HIGH (ref 6–20)
CALCIUM: 8.8 mg/dL — AB (ref 8.9–10.3)
CO2: 23 mmol/L (ref 22–32)
CREATININE: 0.97 mg/dL (ref 0.44–1.00)
Chloride: 102 mmol/L (ref 101–111)
GFR, EST AFRICAN AMERICAN: 60 mL/min — AB (ref >60)
GFR, EST NON AFRICAN AMERICAN: 51 mL/min — AB (ref >60)
Glucose, Bld: 87 mg/dL (ref 65–99)
Potassium: 3.9 mmol/L (ref 3.5–5.1)
SODIUM: 134 mmol/L — AB (ref 135–145)

## 2015-09-06 LAB — LIPASE, BLOOD: LIPASE: 74 U/L — AB (ref 11–51)

## 2015-09-06 LAB — MAGNESIUM: Magnesium: 1.8 mg/dL (ref 1.7–2.4)

## 2015-09-06 LAB — ACTH: C206 ACTH: 19.1 pg/mL (ref 7.2–63.3)

## 2015-09-06 LAB — GLUCOSE, CAPILLARY: Glucose-Capillary: 100 mg/dL — ABNORMAL HIGH (ref 65–99)

## 2015-09-06 LAB — OCCULT BLOOD X 1 CARD TO LAB, STOOL: FECAL OCCULT BLD: NEGATIVE

## 2015-09-06 MED ORDER — DM-GUAIFENESIN ER 30-600 MG PO TB12: 1.0000 | ORAL_TABLET | Freq: Two times a day (BID) | ORAL | Status: DC

## 2015-09-06 MED ORDER — DOXYCYCLINE HYCLATE 100 MG PO TABS: 100.0000 mg | ORAL_TABLET | Freq: Two times a day (BID) | ORAL | Status: DC

## 2015-09-06 MED ORDER — AMLODIPINE BESYLATE 2.5 MG PO TABS: 2.5000 mg | ORAL_TABLET | Freq: Every day | ORAL | Status: DC

## 2015-09-06 MED ORDER — ENSURE ENLIVE PO LIQD: 237.0000 mL | ORAL | Status: DC

## 2015-09-06 MED ORDER — SODIUM CHLORIDE 1 G PO TABS: 1.0000 g | ORAL_TABLET | Freq: Two times a day (BID) | ORAL | Status: DC

## 2015-09-06 NOTE — Discharge Summary (Signed)
Physician Discharge Summary  Donna Burch MBW:[id number] DOB: [birth year] DOA: 09/01/2015  PCP: Vidal Schwalbe, MD  Admit date: 09/01/2015 Discharge date: 09/06/2015  Time spent: 35 minutes  Recommendations for Outpatient Follow-up:  Needs B-met and Mg level to follow electrolytes abnormalities.  Follow with endocrinologist  Needs to follow up with GI for cholelithiasis and abnormal lipase level.   Discharge Diagnoses:    Syncope   Recurrent UTI (urinary tract infection)   CLL (chronic lymphocytic leukemia) (HCC)   Hypertension   Hyperlipidemia   GERD (gastroesophageal reflux disease)   Hypothyroidism   Nausea & vomiting   Hyponatremia   Cough   Thyroid activity decreased   Hypomagnesemia   Symptomatic bradycardia   Junctional escape rhythm   Orthostatic hypotension   Discharge Condition: stable.   Diet recommendation: heart healthy   Filed Weights   09/04/15 0354 09/05/15 0651 09/06/15 0445  Weight: 60.827 kg (134 lb 1.6 oz) 60.737 kg (133 lb 14.4 oz) 61.054 kg (134 lb 9.6 oz)    History of present illness:    Hospital Course:  Hx of CLL, HTN, recurrent UTI, HLD, who presents with cough, generalized weakness, nausea, vomiting, increased urinary frequency, syncope, hyponatremia, orthostatic hypotension, IVF, culture negative, negative flu pcr/respiratory viral panel. Brief junctional rhythm, bradycardia with hypotension 3/11 pm , cardiology consulted. Echo unremarkable. All home bp meds held since admission, restarted bp meds gradually since 3/14. Mild elevation of lipase of unclear etiology, + gallstone, no ab pain, does has poor appetite. Passing gallstones intermittently? outpatient GI follow up. Plan to discharge home on 3/15 with home health, patient is advised not to drive until cleared by pmd. Patient is to close follow up with endocrinologist Dr Earline Mayotte this week. (please see Dr Esmond Harps contact info listed below)  Syncope/orthosatic hypotension/junctional  bradycardia:  Etiology is not clear. Differential diagnosis include orthostatic status secondary to dehydration due to nausea vomiting, vasovagal syncope, UTI or arrhythmia. CT head no acute findings. No hypoxia, no chest pain, less likely PE. Patient is clinically significantly dehydrated.  - Became significantly orthostatic after walking short distance with PT on 3/11, tele during this episode revealed bradycardia, ? Few junctional rhythm? Patient started ted hose and salt tabs, am cortisol level unremarkable. all home bp meds held since admission, restarted norvasc at lower dose on 3/14  -IV fluid: Normal saline bolus 1.5 Lx1 on admission, followed by maintenance fluids. ivf d/cecd on 3/12 due to sign of fluids overload.  -Patient's endocrinologist Dr Earline Mayotte called to recommend consytropin stim test to r/o adrenal insufficency, baseline ACTH and cortisol level and stim test ordered, there is an appropriate increase of cortisol with stim test.  Dr Earline Mayotte is available at [phone] for questions. Dr Earline Mayotte would like to see patient this week after discharge. Per dr Earline Mayotte, though prednisone listed as allergy, patient tolerated hydrocortisone in the past. Per patient h/o prednisone allergy ( with significant anxiety), will defer trial of hydrocortisone to Dr Earline Mayotte at follow up  Junctional rhythm and bradycardia: Cardiology consulted due to junctional rhythm with hypotension, concerning arrhythmia induced syncope.echo unremarkable, cardiology will arrange outpatient monitoring. Need holter   Hyponatremia:  Likely due to combination of decreased oral intake, GI loss from vomiting and home meds HCTZ. -HCTZ held -s/p IVF , ns improving, on salt tabs. -urine studies in the use of hctz not reliable.  improved.   Hypokalemia/Hypomagnesemia: replaced k/mag.  Mild metabolic acidosis: resolved with sodium bicarb tabs.   Recurrent UTI (urinary tract infection):  she  was diagnosed with UTI by PCP  2 days ago. She was given ciprofloxacin, still has symptoms. Patient is not septic on admission. Pt does not meet criteria for sepsis. Lactate wnl. -f/u Bx and Ux, no growth, -Procalcitonin less than 0.1, lactic acid trended down from 1.1 on admission to 0.8 after hydration. -was started on rocephin on admission, due to sign of fluids overload, change iv abx to oral doxycycline  Cough: Patient has been coughing for 3 weeks. Reported sick contact, Initially coughed up yellow color sputum, which become clear. Initial Chest x-ray is negative. Persistent cough, repeat cxr on 3/12 after hydration+ bronchitis. -Mucinex of a cough -flu PCR negative and respiratory virus panel negative -sputum sample not collected, on abx to cover possible bronchitis Will provide 2 more days of doxycycline to complete 7 days of antibiotics.   Nausea and vomiting: Unclear etiology. Reported vomited x1 on Thursday, denies acid reflux symptom, denies dysphagia, she reported she coughed too much, possibly cough induced, Patient does not have abdominal pain, could also be due to UTI. -EGD 11/2014; 8 cm hiatal hernia. No distal esophageal stricture. 2. Intermittent tertiary contractions in the distal esophagus  -Lipase elevated? curently no n/v, no ab pain, tolerating regular diet, repeat lipase remain mildly elevated, abdominal US reveal gallstones, passing gallstones? Need outpatient gi follow up. Need to be clear by cardiology prior to any procedure.  -has not required any Zofran for nausea in the hospital   Hypertension: orthostatic hypotension on admission, all home bp meds held since admission, restarted low dose norvasc on 3/14. Continue to adjust bp meds. Stable.  CLL (chronic lymphocytic leukemia) [facility]): followed by hematologist dr. Sullivan Lone. Last seen was on 07/18/78. Per Dr. Lupita Shutter note, pt has CCL of Rai stage 0. No indication for treatment of her CLL at this time. WBC=11.7 -f/u with  hematology   Normocytic Anemia: hgb on presentation 10.7 likely due to hemoconcentration, hgb 9 on 3/12, likely baseline, anemia work up with normal b12/folate/ iron level/FOBt pending. Follow up outpatient.   HLD:  -Continue home medications: Lipitor -FLP ldl 64   GERD: -Protonix   Hypothyroidism: Last TSH was 0.85 on 10/07/10 -increase Synthroid from 36mg to 737m -TSH 5.8 -pmd to repeat tsh in 4-6 weeks.   Procedures:  Cardiology   Consultations:    Discharge Exam: Filed Vitals:   09/05/15 2137 09/06/15 0445  BP: 137/66 136/56  Pulse: 91 89  Temp: 98.6 F (37 C) 98 F (36.7 C)  Resp: 18 21    General: NAD Cardiovascular: S 1, S 2 RRR Respiratory: CTA  Discharge Instructions   Discharge Instructions    Face-to-face encounter (required for Medicare/Medicaid patients)    Complete by:  As directed   I Xu,Fang certify that this patient is under my care and that I, or a nurse practitioner or physician's assistant working with me, had a face-to-face encounter that meets the physician face-to-face encounter requirements with this patient on 09/04/2015. The encounter with the patient was in whole, or in part for the following medical condition(s) which is the primary reason for home health care (List medical condition): FTT  The encounter with the patient was in whole, or in part, for the following medical condition, which is the primary reason for home health care:  FTT  I certify that, based on my findings, the following services are medically necessary home health services:  Physical therapy  Reason for Medically Necessary Home Health Services:  Skilled Nursing- Change/Decline in Patient Status  My clinical findings support the need for the above services:  Unsafe ambulation due to balance issues  Further, I certify that my clinical findings support that this patient is homebound due to:  Unsafe ambulation due to balance issues     Home Health    Complete by:  As  directed   To provide the following care/treatments:   PT OT       Increase activity slowly    Complete by:  As directed           Current Discharge Medication List    START taking these medications   Details  dextromethorphan-guaiFENesin (MUCINEX DM) 30-600 MG 12hr tablet Take 1 tablet by mouth 2 (two) times daily. Qty: 10 tablet, Refills: 0    doxycycline (VIBRA-TABS) 100 MG tablet Take 1 tablet (100 mg total) by mouth every 12 (twelve) hours. Qty: 4 tablet, Refills: 0    feeding supplement, ENSURE ENLIVE, (ENSURE ENLIVE) LIQD Take 237 mLs by mouth daily. Qty: 237 mL, Refills: 12    sodium chloride 1 g tablet Take 1 tablet (1 g total) by mouth 2 (two) times daily with a meal. Qty: 10 tablet, Refills: 0      CONTINUE these medications which have CHANGED   Details  amLODipine (NORVASC) 2.5 MG tablet Take 1 tablet (2.5 mg total) by mouth daily. Qty: 30 tablet, Refills: 0      CONTINUE these medications which have NOT CHANGED   Details  aspirin 81 MG tablet Take 81 mg by mouth daily.    atorvastatin (LIPITOR) 80 MG tablet Take 80 mg by mouth daily at 6 PM.     cholecalciferol (VITAMIN D) 1000 UNITS tablet Take 2,000 Units by mouth 2 (two) times daily.     levothyroxine (SYNTHROID, LEVOTHROID) 50 MCG tablet     magnesium chloride (SLOW-MAG) 64 MG TBEC SR tablet Take 1 tablet by mouth 2 (two) times daily.     Nutritional Supplements (COLON FORMULA PO) Take 1 capsule by mouth daily.     omeprazole (PRILOSEC) 20 MG capsule Take 20 mg by mouth daily.      STOP taking these medications     ciprofloxacin (CIPRO) 250 MG tablet      valsartan-hydrochlorothiazide (DIOVAN-HCT) 160-12.5 MG per tablet      HYDROcodone-acetaminophen (NORCO/VICODIN) 5-325 MG per tablet        Allergies  Allergen Reactions  . Prednisone Other (See Comments)    "Jittery"  . Reclast [Zoledronic Acid] Other (See Comments)    Pt did not have a memory, from the day after she took that  medication    Follow-up Information    Follow up with KERR,JEFFREY, MD On 09/08/2015.   Specialty:  Endocrinology   Why:  earlier if possible, repeat basic labs at follow up   Contact information:   [address] [place] [address] [phone]       Follow up with Vidal Schwalbe, MD In 1 week.   Specialty:  Family Medicine   Why:  hospital discharge follow up, repeat bmp at follow up   Contact information:   [place] [address] [phone]       Follow up with [facility] In 2 weeks.   Why:  n/v, elevated lipase, gallstone   Contact information:   [address] [place] [address] [phone]       Follow up with [facility]  Specialty:  Cardiology   Why:  cardiology office will contact you to arrange holter monitor   Contact information:   [address], [place] [phone]       The results of significant diagnostics from this hospitalization (including imaging, microbiology, ancillary and laboratory) are listed below for reference.    Significant Diagnostic Studies: Dg Chest 2 View  09/03/2015  CLINICAL DATA:  82 year old female with history shortness of breath and productive cough. Thick clear mucus production for 1 month. EXAM: CHEST  2 VIEW COMPARISON:  Chest x-ray 09/01/2015. FINDINGS: Lung volumes are normal. No consolidative airspace disease. With trace bilateral pleural effusions. Very mild diffuse peribronchial cuffing. No pneumothorax. No suspicious appearing pulmonary nodules or masses. No evidence of pulmonary edema. Heart size is normal. Upper mediastinal contours are within normal limits. Large hiatal hernia. Surgical clips on the right side of the thoracic inlet, likely from prior thyroid surgery. IMPRESSION: 1. Mild diffuse peribronchial cuffing suggesting a mild acute bronchitis. 2. Trace bilateral pleural effusions.  3. Atherosclerosis. Electronically Signed   By: Vinnie Langton M.D.   On: 09/03/2015 14:32   Dg Chest 2 View  09/01/2015  CLINICAL DATA:  Weakness EXAM: CHEST  2 VIEW COMPARISON:  12/01/2014 FINDINGS: Heart size within normal limits. Negative for heart failure or edema. Negative for infiltrate effusion or mass. Lungs remain clear Moderately large hiatal hernia Interval right thyroid surgery. IMPRESSION: No active cardiopulmonary disease. Electronically Signed   By: Franchot Gallo M.D.   On: 09/01/2015 18:16   Ct Head Wo Contrast  09/01/2015  CLINICAL DATA:  82 year old female with several syncopal episodes for the past 5 days, currently being treated for urinary tract infection. EXAM: CT HEAD WITHOUT CONTRAST TECHNIQUE: Contiguous axial images were obtained from the base of the skull through the vertex without intravenous contrast. COMPARISON:  No priors. FINDINGS: Mild cerebral atrophy. Patchy and confluent areas of decreased attenuation are noted throughout the deep and periventricular white matter of the cerebral hemispheres bilaterally, compatible with chronic microvascular ischemic disease. No acute intracranial abnormalities. Specifically, no evidence of acute intracranial hemorrhage, no definite findings of acute/subacute cerebral ischemia, no mass, mass effect, hydrocephalus or abnormal intra or extra-axial fluid collections. Visualized paranasal sinuses and mastoids are well pneumatized. No acute displaced skull fractures are identified. IMPRESSION: 1. No acute intracranial abnormalities. 2. Mild cerebral atrophy with chronic microvascular ischemic changes in cerebral white matter, as above. Electronically Signed   By: Vinnie Langton M.D.   On: 09/01/2015 18:49   US Abdomen Complete  09/04/2015  CLINICAL DATA:  82 year old with personal history of recurrent urinary tract infections, current history of CLL, presenting with episodes of nausea and vomiting over the past several months and elevated  lipase on recent laboratory evaluation. EXAM: ABDOMEN ULTRASOUND COMPLETE COMPARISON:  Urinary tract ultrasound 06/27/2014 [facility]. CT abdomen and pelvis 03/25/2014 and earlier. FINDINGS: Gallbladder: Multiple mobile echogenic gallstones, the largest measuring about 5 mm. No gallbladder wall thickening or pericholecystic fluid. Negative sonographic Murphy sign according to the ultrasound technologist. Common bile duct: Diameter: Approximately 3 mm centrally. Distal duct obscured by duodenal bowel gas. Liver: Normal size and echotexture without focal parenchymal abnormality. Patent portal vein with hepatopetal flow. IVC: Patent in its intrahepatic portion. Obscured outside the liver by bowel gas. Pancreas: Not visualized due to overlying bowel gas. Spleen: Normal size and echotexture without focal parenchymal abnormality. Right Kidney: Length: Approximately 12.6 cm (measurement includes a lower pole cyst as noted on  the prior examinations). Approximate 5 cm simple cyst arising from the lower pole the right kidney, increased in size since 2015. No significant focal renal parenchymal abnormality. Mild diffuse cortical thinning as noted previously. No evidence of hydronephrosis. Left Kidney: Length: Approximately 9.9 cm. Dilated renal pelvis as noted previously. No calyceal dilation. No focal parenchymal abnormality. Mild diffuse cortical thinning as noted previously. Abdominal aorta: Obscured in its mid to distal portion by bowel gas. Moderate atherosclerosis involving the proximal aorta with maximum diameter approximately 2.2 cm. Other findings: None. IMPRESSION: 1. Cholelithiasis without sonographic evidence of acute cholecystitis. 2. Nonvisualization of the pancreas due to overlying bowel gas. 3. Chronic left UPJ stenosis accounting for a dilated left renal pelvis, unchanged dating back to 2012. 4. Mild diffuse cortical thinning involving both kidneys as noted previously. 5. Extrahepatic IVC and  mid and distal abdominal aorta obscured by overlying bowel gas and therefore not evaluated. Electronically Signed   By: Evangeline Dakin M.D.   On: 09/04/2015 15:47    Microbiology: Recent Results (from the past 240 hour(s))  Urine culture     Status: None   Collection Time: 09/01/15 10:02 PM  Result Value Ref Range Status   Specimen Description URINE, CLEAN CATCH  Final   Special Requests NONE  Final   Culture MULTIPLE SPECIES PRESENT, SUGGEST RECOLLECTION  Final   Report Status 09/03/2015 FINAL  Final  Respiratory virus panel     Status: None   Collection Time: 09/01/15 11:40 PM  Result Value Ref Range Status   Source - RVPAN NASAL SWAB  Corrected   Respiratory Syncytial Virus A Negative Negative Final   Respiratory Syncytial Virus B Negative Negative Final   Influenza A Negative Negative Final   Influenza B Negative Negative Final   Parainfluenza 1 Negative Negative Final   Parainfluenza 2 Negative Negative Final   Parainfluenza 3 Negative Negative Final   Metapneumovirus Negative Negative Final   Rhinovirus Negative Negative Final   Adenovirus Negative Negative Final    Comment: (NOTE) Performed At: [facility] [place], [place] [id number] Lindon Romp MD KG:[id number]   Culture, blood (routine x 2)     Status: None (Preliminary result)   Collection Time: 09/03/15  1:20 PM  Result Value Ref Range Status   Specimen Description BLOOD LEFT HAND  Final   Special Requests BOTTLES DRAWN AEROBIC ONLY 5CC  Final   Culture NO GROWTH 2 DAYS  Final   Report Status PENDING  Incomplete  Culture, blood (routine x 2)     Status: None (Preliminary result)   Collection Time: 09/03/15  1:30 PM  Result Value Ref Range Status   Specimen Description BLOOD LEFT HAND  Final   Special Requests BOTTLES DRAWN AEROBIC ONLY 4CC  Final   Culture NO GROWTH 2 DAYS  Final   Report Status PENDING  Incomplete     Labs: Basic Metabolic Panel:  Recent Labs Lab  09/02/15 0956 09/03/15 0359 09/04/15 0547 09/05/15 0658 09/06/15 0440  NA 129* 130* 133* 133* 134*  K 4.0 3.2* 4.2 4.4 3.9  CL 98* 104 106 99* 102  CO2 18* 19* '22 23 23  ' GLUCOSE 123* 102* 81 100* 87  BUN '18 13 12 16 ' 22*  CREATININE 0.88 0.84 0.83 1.00 0.97  CALCIUM 8.4* 8.0* 8.4* 8.9 8.8*  MG 1.1* 1.7 1.5* 1.3* 1.8   Liver Function Tests:  Recent Labs Lab 09/01/15 1758 09/05/15 0658  AST 34 32  ALT 23 25  ALKPHOS  76 69  BILITOT 0.4 0.5  PROT 6.8 6.0*  ALBUMIN 3.1* 2.8*    Recent Labs Lab 09/02/15 0052 09/04/15 0547 09/05/15 0658 09/06/15 0440  LIPASE 74* 71* 73* 74*   No results for input(s): AMMONIA in the last 168 hours. CBC:  Recent Labs Lab 09/01/15 1454 09/03/15 0359 09/05/15 0658  WBC 11.7* 9.3 10.3  HGB 10.9* 9.1* 10.0*  HCT 31.6* 27.1* 30.1*  MCV 87.3 89.1 89.9  PLT 274 255 255   Cardiac Enzymes:  Recent Labs Lab 09/01/15 1758  TROPONINI <0.03   BNP: BNP (last 3 results)  Recent Labs  09/01/15 1758  BNP 46.2    ProBNP (last 3 results) No results for input(s): PROBNP in the last 8760 hours.  CBG:  Recent Labs Lab 09/04/15 0606 09/04/15 0653 09/04/15 1207 09/05/15 0657 09/06/15 0628  GLUCAP 77 107* 92 86 100*       Signed:  Niel Hummer A MD.  [facility] 09/06/2015, 11:39 AM

## 2015-09-06 NOTE — Progress Notes (Signed)
Orders received for pt discharge.  Discharge summary printed and reviewed with pt.  Explained medication regimen, and pt had no further questions at this time.  IV removed and site remains clean, dry, intact.  Telemetry removed.  Pt in stable condition and awaiting transport. 

## 2015-09-06 NOTE — Progress Notes (Signed)
Physical Therapy Treatment & Discharge Patient Details Name: Donna THETFORD MRN: [id number] DOB: 12/06/1926 Today's Date: 09/06/2015    History of Present Illness 82 y.o. female with PMH of CLL, hypertension, hyperlipidemia, GERD, hypothyroidism, kidney stone, C. difficile colitis, skin cancer, recurrent UTI, arthritis, who presents with cough, generalized weakness, nausea, vomiting, increased urinary frequency, syncope, hyponatremia    PT Comments    Pt was independent to supervision with all functional mobility today. She scored a 20/24 on the dynamic gait assessment and had no LOB with mobility. Pt was able to ambulate 300 feet with no AD today. Pt has met all Acute PT goals and Acute PT is signing off at this time. Pt is okay to return home with intermittent supervision/assistance. Pt still would benefit from [facility] PT for home safety eval to further prevent fall risk and ensure a smooth transition home after discharge.    Follow Up Recommendations  Home health PT;Supervision - Intermittent     Equipment Recommendations  None recommended by PT    Recommendations for Other Services       Precautions / Restrictions Precautions Precautions: Fall Restrictions Weight Bearing Restrictions: No    Mobility  Bed Mobility Overal bed mobility: Modified Independent             General bed mobility comments: Pt independent with use of bedrails  Transfers Overall transfer level: Independent Equipment used: None Transfers: Sit to/from [employer] to Stand: Independent Stand pivot transfers: Independent       General transfer comment: Independent with no AD and no LOB.   Ambulation/Gait Ambulation/Gait assistance: Supervision Ambulation Distance (Feet): 300 Feet Assistive device: None Gait Pattern/deviations: Step-through pattern;Decreased stride length;Narrow base of support Gait velocity: mildly decreased Gait velocity interpretation: Below normal speed  for age/gender General Gait Details: Pt able to ambulate further today. She was had no c/o SOB and no LOB.    Stairs Stairs: Yes Stairs assistance: Min guard Stair Management: One rail Right;Step to pattern;Forwards Number of Stairs: 4 General stair comments: MinGuard for safety only. Pt states her son can help her get in the house and she can use her back door that has rails.   Wheelchair Mobility    Modified Rankin (Stroke Patients Only)       Balance Overall balance assessment: Needs assistance Sitting-balance support: No upper extremity supported;Feet supported Sitting balance-Leahy Scale: Normal     Standing balance support: No upper extremity supported Standing balance-Leahy Scale: Good               High level balance activites: Other (comment) (Picking object off the floor) High Level Balance Comments: Able to pick object off the floor with MinGuard for safety.          Standardized Balance Assessment  Standardized Balance Assessment  Dynamic Gait Index  Dynamic Gait Index  Level Surface 3  Change in Gait Speed 3  Gait with Horizontal Head Turns 3  Gait with Vertical Head Turns 3  Gait and Pivot Turn 2  Step Over Obstacle 2  Step Around Obstacles 3  Steps 1  Total Score 20        Cognition Arousal/Alertness: Awake/alert Behavior During Therapy: WFL for tasks assessed/performed Overall Cognitive Status: Within Functional Limits for tasks assessed                      Exercises      General Comments General comments (skin integrity, edema, etc.): Pt very pleasant  and agreeable to therapy. Pt states she is ready to go home and has people to come stay her for a few days.       Pertinent Vitals/Pain Pain Assessment: No/denies pain Vitals stable throughout activity. Pt had no complaints of SOB or dizziness.     Home Living                      Prior Function            PT Goals (current goals can now be found in the care  plan section) Acute Rehab PT Goals Patient Stated Goal: to be able to get home safely PT Goal Formulation: All assessment and education complete, DC therapy Progress towards PT goals: Goals met/education completed, patient discharged from PT    Frequency       PT Plan Current plan remains appropriate    Co-evaluation             End of Session Equipment Utilized During Treatment: Gait belt Activity Tolerance: Patient tolerated treatment well Patient left: in chair;with call bell/phone within reach;with chair alarm set     Time: 0981-1914 PT Time Calculation (min) (ACUTE ONLY): 17 min  Charges:  $Gait Training: 8-22 mins                    G Codes:      Colon Branch, SPT Colon Branch 09/06/2015, 10:28 AM

## 2015-09-06 NOTE — Progress Notes (Signed)
Occupational Therapy Treatment Patient Details Name: Donna Burch MRN: [id number] DOB: [birth year] Today's Date: 09/06/2015    History of present illness 82 y.o. female with PMH of CLL, hypertension, hyperlipidemia, GERD, hypothyroidism, kidney stone, C. difficile colitis, skin cancer, recurrent UTI, arthritis, who presents with cough, generalized weakness, nausea, vomiting, increased urinary frequency, syncope, hyponatremia   OT comments  Pt is at adequate level for d/c home and able to dress herself without (A). All education is complete and patient indicates understanding. Pt's son present and educated on pending RN d/c instructions required prior to dc home. Pt reports "i am discharged after talking to md." pt educated on pending d/c after paper work complete and provided by [employer].   Follow Up Recommendations  Home health OT;Supervision - Intermittent    Equipment Recommendations  None recommended by OT    Recommendations for Other Services      Precautions / Restrictions Precautions Precautions: Fall Restrictions Weight Bearing Restrictions: No       Mobility Bed Mobility               General bed mobility comments: inchair on arrival  Transfers                      Balance                                   ADL Overall ADL's : Modified independent                         Toilet Transfer: Modified Independent             General ADL Comments: pt gathered all clothing, don it in sit<.stand without deficits and organized all items in a patient home belongings bag. pt is packed in the chair with son prsent to dc home.      Vision                     Perception     Praxis      Cognition   Behavior During Therapy: WFL for tasks assessed/performed Overall Cognitive Status: Within Functional Limits for tasks assessed                       Extremity/Trunk Assessment               Exercises      Shoulder Instructions       General Comments      Pertinent Vitals/ Pain       Pain Assessment: No/denies pain  Home Living                                          Prior Functioning/Environment              Frequency Min 2X/week     Progress Toward Goals  OT Goals(current goals can now be found in the care plan section)  Progress towards OT goals: Goals met and updated - see care plan;Goals met/education completed, patient discharged from OT  Acute Rehab OT Goals Patient Stated Goal: to be able to get home safely Time For Goal Achievement: 09/18/15 Potential to Achieve Goals: Good ADL Goals Pt Will Perform Grooming: with modified independence;sitting Pt Will  Perform Lower Body Bathing: with modified independence;sit to/from stand;with adaptive equipment Pt Will Perform Lower Body Dressing: with modified independence;with adaptive equipment;sit to/from stand Pt Will Transfer to Toilet: with modified independence;ambulating;bedside commode Pt Will Perform Toileting - Clothing Manipulation and hygiene: with modified independence;sit to/from stand Pt Will Perform Tub/Shower Transfer: Tub transfer;Shower transfer;ambulating;with min guard assist;shower seat;grab bars;rolling walker  Plan Discharge plan remains appropriate    Co-evaluation                 End of Session     Activity Tolerance Patient tolerated treatment well   Patient Left in chair;with call bell/phone within reach;with family/visitor present   Nurse Communication Mobility status;Precautions        Time: 2751-7001 OT Time Calculation (min): 19 min  Charges: OT General Charges $OT Visit: 1 Procedure OT Treatments $Self Care/Home Management : 8-22 mins  Parke Poisson B 09/06/2015, 2:52 PM    Jeri Modena   OTR/L Pager: [phone] Office: [phone] .

## 2015-09-08 LAB — CULTURE, BLOOD (ROUTINE X 2)
Culture: NO GROWTH
Culture: NO GROWTH

## 2015-09-09 DIAGNOSIS — M6281 Muscle weakness (generalized): Secondary | ICD-10-CM | POA: Diagnosis not present

## 2015-09-09 DIAGNOSIS — Z87440 Personal history of urinary (tract) infections: Secondary | ICD-10-CM | POA: Diagnosis not present

## 2015-09-09 DIAGNOSIS — Z7982 Long term (current) use of aspirin: Secondary | ICD-10-CM | POA: Diagnosis not present

## 2015-09-09 DIAGNOSIS — R55 Syncope and collapse: Secondary | ICD-10-CM | POA: Diagnosis not present

## 2015-09-09 DIAGNOSIS — I10 Essential (primary) hypertension: Secondary | ICD-10-CM | POA: Diagnosis not present

## 2015-09-09 DIAGNOSIS — Z85828 Personal history of other malignant neoplasm of skin: Secondary | ICD-10-CM | POA: Diagnosis not present

## 2015-09-09 DIAGNOSIS — N390 Urinary tract infection, site not specified: Secondary | ICD-10-CM | POA: Diagnosis not present

## 2015-09-09 DIAGNOSIS — C9110 Chronic lymphocytic leukemia of B-cell type not having achieved remission: Secondary | ICD-10-CM | POA: Diagnosis not present

## 2015-09-11 DIAGNOSIS — E039 Hypothyroidism, unspecified: Secondary | ICD-10-CM | POA: Diagnosis not present

## 2015-09-11 DIAGNOSIS — M6281 Muscle weakness (generalized): Secondary | ICD-10-CM | POA: Diagnosis not present

## 2015-09-11 DIAGNOSIS — D649 Anemia, unspecified: Secondary | ICD-10-CM | POA: Diagnosis not present

## 2015-09-11 DIAGNOSIS — I10 Essential (primary) hypertension: Secondary | ICD-10-CM | POA: Diagnosis not present

## 2015-09-11 DIAGNOSIS — Z8639 Personal history of other endocrine, nutritional and metabolic disease: Secondary | ICD-10-CM | POA: Diagnosis not present

## 2015-09-11 DIAGNOSIS — R55 Syncope and collapse: Secondary | ICD-10-CM | POA: Diagnosis not present

## 2015-09-11 DIAGNOSIS — Z7982 Long term (current) use of aspirin: Secondary | ICD-10-CM | POA: Diagnosis not present

## 2015-09-11 DIAGNOSIS — N390 Urinary tract infection, site not specified: Secondary | ICD-10-CM | POA: Diagnosis not present

## 2015-09-11 DIAGNOSIS — E871 Hypo-osmolality and hyponatremia: Secondary | ICD-10-CM | POA: Diagnosis not present

## 2015-09-11 DIAGNOSIS — C9110 Chronic lymphocytic leukemia of B-cell type not having achieved remission: Secondary | ICD-10-CM | POA: Diagnosis not present

## 2015-09-13 ENCOUNTER — Telehealth: Payer: Self-pay | Admitting: *Deleted

## 2015-09-13 ENCOUNTER — Other Ambulatory Visit: Payer: Self-pay | Admitting: Cardiovascular Disease

## 2015-09-13 DIAGNOSIS — R55 Syncope and collapse: Secondary | ICD-10-CM | POA: Diagnosis not present

## 2015-09-13 DIAGNOSIS — R0600 Dyspnea, unspecified: Secondary | ICD-10-CM

## 2015-09-13 DIAGNOSIS — M6281 Muscle weakness (generalized): Secondary | ICD-10-CM | POA: Diagnosis not present

## 2015-09-13 DIAGNOSIS — C9110 Chronic lymphocytic leukemia of B-cell type not having achieved remission: Secondary | ICD-10-CM | POA: Diagnosis not present

## 2015-09-13 DIAGNOSIS — I4589 Other specified conduction disorders: Secondary | ICD-10-CM

## 2015-09-13 DIAGNOSIS — I498 Other specified cardiac arrhythmias: Secondary | ICD-10-CM | POA: Diagnosis not present

## 2015-09-13 DIAGNOSIS — R0689 Other abnormalities of breathing: Principal | ICD-10-CM

## 2015-09-13 DIAGNOSIS — E871 Hypo-osmolality and hyponatremia: Secondary | ICD-10-CM | POA: Diagnosis not present

## 2015-09-13 DIAGNOSIS — I10 Essential (primary) hypertension: Secondary | ICD-10-CM | POA: Diagnosis not present

## 2015-09-13 DIAGNOSIS — Z7982 Long term (current) use of aspirin: Secondary | ICD-10-CM | POA: Diagnosis not present

## 2015-09-13 DIAGNOSIS — R42 Dizziness and giddiness: Secondary | ICD-10-CM

## 2015-09-13 DIAGNOSIS — N390 Urinary tract infection, site not specified: Secondary | ICD-10-CM | POA: Diagnosis not present

## 2015-09-13 NOTE — Telephone Encounter (Signed)
Ivin Booty called from [employer] office - pt was supposed to be set up for 30 day event monitor at hosp discharge 1 week ago, somehow order missed & pt inquired today at [facility] st office.  Order placed. Ivin Booty will set pt up for monitor tech appt there. I will send note to scheduling for follow up w/ Dr. Sallyanne Kuster in 6 weeks.

## 2015-09-14 ENCOUNTER — Ambulatory Visit (INDEPENDENT_AMBULATORY_CARE_PROVIDER_SITE_OTHER): Payer: Medicare Other

## 2015-09-14 DIAGNOSIS — N390 Urinary tract infection, site not specified: Secondary | ICD-10-CM | POA: Diagnosis not present

## 2015-09-14 DIAGNOSIS — I4589 Other specified conduction disorders: Secondary | ICD-10-CM | POA: Diagnosis not present

## 2015-09-14 DIAGNOSIS — R42 Dizziness and giddiness: Secondary | ICD-10-CM | POA: Diagnosis not present

## 2015-09-14 DIAGNOSIS — C9110 Chronic lymphocytic leukemia of B-cell type not having achieved remission: Secondary | ICD-10-CM | POA: Diagnosis not present

## 2015-09-14 DIAGNOSIS — Z7982 Long term (current) use of aspirin: Secondary | ICD-10-CM | POA: Diagnosis not present

## 2015-09-14 DIAGNOSIS — I498 Other specified cardiac arrhythmias: Secondary | ICD-10-CM

## 2015-09-14 DIAGNOSIS — M6281 Muscle weakness (generalized): Secondary | ICD-10-CM | POA: Diagnosis not present

## 2015-09-14 DIAGNOSIS — R55 Syncope and collapse: Secondary | ICD-10-CM

## 2015-09-14 DIAGNOSIS — I10 Essential (primary) hypertension: Secondary | ICD-10-CM | POA: Diagnosis not present

## 2015-09-18 DIAGNOSIS — Z7982 Long term (current) use of aspirin: Secondary | ICD-10-CM | POA: Diagnosis not present

## 2015-09-18 DIAGNOSIS — R55 Syncope and collapse: Secondary | ICD-10-CM | POA: Diagnosis not present

## 2015-09-18 DIAGNOSIS — N390 Urinary tract infection, site not specified: Secondary | ICD-10-CM | POA: Diagnosis not present

## 2015-09-18 DIAGNOSIS — C9110 Chronic lymphocytic leukemia of B-cell type not having achieved remission: Secondary | ICD-10-CM | POA: Diagnosis not present

## 2015-09-18 DIAGNOSIS — M6281 Muscle weakness (generalized): Secondary | ICD-10-CM | POA: Diagnosis not present

## 2015-09-18 DIAGNOSIS — I10 Essential (primary) hypertension: Secondary | ICD-10-CM | POA: Diagnosis not present

## 2015-09-19 DIAGNOSIS — R55 Syncope and collapse: Secondary | ICD-10-CM | POA: Diagnosis not present

## 2015-09-19 DIAGNOSIS — Z7982 Long term (current) use of aspirin: Secondary | ICD-10-CM | POA: Diagnosis not present

## 2015-09-19 DIAGNOSIS — N390 Urinary tract infection, site not specified: Secondary | ICD-10-CM | POA: Diagnosis not present

## 2015-09-19 DIAGNOSIS — M6281 Muscle weakness (generalized): Secondary | ICD-10-CM | POA: Diagnosis not present

## 2015-09-19 DIAGNOSIS — I10 Essential (primary) hypertension: Secondary | ICD-10-CM | POA: Diagnosis not present

## 2015-09-19 DIAGNOSIS — C9110 Chronic lymphocytic leukemia of B-cell type not having achieved remission: Secondary | ICD-10-CM | POA: Diagnosis not present

## 2015-09-20 DIAGNOSIS — C9110 Chronic lymphocytic leukemia of B-cell type not having achieved remission: Secondary | ICD-10-CM | POA: Diagnosis not present

## 2015-09-20 DIAGNOSIS — Z7982 Long term (current) use of aspirin: Secondary | ICD-10-CM | POA: Diagnosis not present

## 2015-09-20 DIAGNOSIS — N390 Urinary tract infection, site not specified: Secondary | ICD-10-CM | POA: Diagnosis not present

## 2015-09-20 DIAGNOSIS — M6281 Muscle weakness (generalized): Secondary | ICD-10-CM | POA: Diagnosis not present

## 2015-09-20 DIAGNOSIS — I10 Essential (primary) hypertension: Secondary | ICD-10-CM | POA: Diagnosis not present

## 2015-09-20 DIAGNOSIS — R55 Syncope and collapse: Secondary | ICD-10-CM | POA: Diagnosis not present

## 2015-09-24 DIAGNOSIS — R55 Syncope and collapse: Secondary | ICD-10-CM | POA: Diagnosis not present

## 2015-09-24 DIAGNOSIS — Z7982 Long term (current) use of aspirin: Secondary | ICD-10-CM | POA: Diagnosis not present

## 2015-09-24 DIAGNOSIS — C9110 Chronic lymphocytic leukemia of B-cell type not having achieved remission: Secondary | ICD-10-CM | POA: Diagnosis not present

## 2015-09-24 DIAGNOSIS — M6281 Muscle weakness (generalized): Secondary | ICD-10-CM | POA: Diagnosis not present

## 2015-09-24 DIAGNOSIS — I10 Essential (primary) hypertension: Secondary | ICD-10-CM | POA: Diagnosis not present

## 2015-09-24 DIAGNOSIS — N390 Urinary tract infection, site not specified: Secondary | ICD-10-CM | POA: Diagnosis not present

## 2015-09-25 DIAGNOSIS — R55 Syncope and collapse: Secondary | ICD-10-CM | POA: Diagnosis not present

## 2015-09-25 DIAGNOSIS — N390 Urinary tract infection, site not specified: Secondary | ICD-10-CM | POA: Diagnosis not present

## 2015-09-25 DIAGNOSIS — Z7982 Long term (current) use of aspirin: Secondary | ICD-10-CM | POA: Diagnosis not present

## 2015-09-25 DIAGNOSIS — C9110 Chronic lymphocytic leukemia of B-cell type not having achieved remission: Secondary | ICD-10-CM | POA: Diagnosis not present

## 2015-09-25 DIAGNOSIS — I10 Essential (primary) hypertension: Secondary | ICD-10-CM | POA: Diagnosis not present

## 2015-09-25 DIAGNOSIS — M6281 Muscle weakness (generalized): Secondary | ICD-10-CM | POA: Diagnosis not present

## 2015-09-27 ENCOUNTER — Other Ambulatory Visit: Payer: Self-pay | Admitting: *Deleted

## 2015-09-27 DIAGNOSIS — I10 Essential (primary) hypertension: Secondary | ICD-10-CM | POA: Diagnosis not present

## 2015-09-27 DIAGNOSIS — N390 Urinary tract infection, site not specified: Secondary | ICD-10-CM | POA: Diagnosis not present

## 2015-09-27 DIAGNOSIS — M6281 Muscle weakness (generalized): Secondary | ICD-10-CM | POA: Diagnosis not present

## 2015-09-27 DIAGNOSIS — C9110 Chronic lymphocytic leukemia of B-cell type not having achieved remission: Secondary | ICD-10-CM | POA: Diagnosis not present

## 2015-09-27 DIAGNOSIS — Z7982 Long term (current) use of aspirin: Secondary | ICD-10-CM | POA: Diagnosis not present

## 2015-09-27 DIAGNOSIS — R55 Syncope and collapse: Secondary | ICD-10-CM | POA: Diagnosis not present

## 2015-10-02 DIAGNOSIS — M6281 Muscle weakness (generalized): Secondary | ICD-10-CM | POA: Diagnosis not present

## 2015-10-02 DIAGNOSIS — R55 Syncope and collapse: Secondary | ICD-10-CM | POA: Diagnosis not present

## 2015-10-02 DIAGNOSIS — I10 Essential (primary) hypertension: Secondary | ICD-10-CM | POA: Diagnosis not present

## 2015-10-02 DIAGNOSIS — Z7982 Long term (current) use of aspirin: Secondary | ICD-10-CM | POA: Diagnosis not present

## 2015-10-02 DIAGNOSIS — N390 Urinary tract infection, site not specified: Secondary | ICD-10-CM | POA: Diagnosis not present

## 2015-10-02 DIAGNOSIS — C9110 Chronic lymphocytic leukemia of B-cell type not having achieved remission: Secondary | ICD-10-CM | POA: Diagnosis not present

## 2015-10-03 DIAGNOSIS — Z7982 Long term (current) use of aspirin: Secondary | ICD-10-CM | POA: Diagnosis not present

## 2015-10-03 DIAGNOSIS — C9110 Chronic lymphocytic leukemia of B-cell type not having achieved remission: Secondary | ICD-10-CM | POA: Diagnosis not present

## 2015-10-03 DIAGNOSIS — N390 Urinary tract infection, site not specified: Secondary | ICD-10-CM | POA: Diagnosis not present

## 2015-10-03 DIAGNOSIS — I10 Essential (primary) hypertension: Secondary | ICD-10-CM | POA: Diagnosis not present

## 2015-10-03 DIAGNOSIS — M6281 Muscle weakness (generalized): Secondary | ICD-10-CM | POA: Diagnosis not present

## 2015-10-03 DIAGNOSIS — R55 Syncope and collapse: Secondary | ICD-10-CM | POA: Diagnosis not present

## 2015-10-04 DIAGNOSIS — K219 Gastro-esophageal reflux disease without esophagitis: Secondary | ICD-10-CM | POA: Diagnosis not present

## 2015-10-04 DIAGNOSIS — K8020 Calculus of gallbladder without cholecystitis without obstruction: Secondary | ICD-10-CM | POA: Diagnosis not present

## 2015-10-05 DIAGNOSIS — E039 Hypothyroidism, unspecified: Secondary | ICD-10-CM | POA: Diagnosis not present

## 2015-10-05 DIAGNOSIS — Z8639 Personal history of other endocrine, nutritional and metabolic disease: Secondary | ICD-10-CM | POA: Diagnosis not present

## 2015-10-05 DIAGNOSIS — D649 Anemia, unspecified: Secondary | ICD-10-CM | POA: Diagnosis not present

## 2015-10-09 DIAGNOSIS — C9110 Chronic lymphocytic leukemia of B-cell type not having achieved remission: Secondary | ICD-10-CM | POA: Diagnosis not present

## 2015-10-09 DIAGNOSIS — M6281 Muscle weakness (generalized): Secondary | ICD-10-CM | POA: Diagnosis not present

## 2015-10-09 DIAGNOSIS — Z7982 Long term (current) use of aspirin: Secondary | ICD-10-CM | POA: Diagnosis not present

## 2015-10-09 DIAGNOSIS — N390 Urinary tract infection, site not specified: Secondary | ICD-10-CM | POA: Diagnosis not present

## 2015-10-09 DIAGNOSIS — R55 Syncope and collapse: Secondary | ICD-10-CM | POA: Diagnosis not present

## 2015-10-09 DIAGNOSIS — I10 Essential (primary) hypertension: Secondary | ICD-10-CM | POA: Diagnosis not present

## 2015-10-17 DIAGNOSIS — E871 Hypo-osmolality and hyponatremia: Secondary | ICD-10-CM | POA: Diagnosis not present

## 2015-10-17 DIAGNOSIS — D649 Anemia, unspecified: Secondary | ICD-10-CM | POA: Diagnosis not present

## 2015-10-17 DIAGNOSIS — E039 Hypothyroidism, unspecified: Secondary | ICD-10-CM | POA: Diagnosis not present

## 2015-10-25 ENCOUNTER — Ambulatory Visit: Payer: Medicare Other | Admitting: Cardiovascular Disease

## 2015-10-25 DIAGNOSIS — C9110 Chronic lymphocytic leukemia of B-cell type not having achieved remission: Secondary | ICD-10-CM | POA: Diagnosis not present

## 2015-10-25 DIAGNOSIS — N183 Chronic kidney disease, stage 3 (moderate): Secondary | ICD-10-CM | POA: Diagnosis not present

## 2015-10-25 DIAGNOSIS — I129 Hypertensive chronic kidney disease with stage 1 through stage 4 chronic kidney disease, or unspecified chronic kidney disease: Secondary | ICD-10-CM | POA: Diagnosis not present

## 2015-10-25 DIAGNOSIS — E785 Hyperlipidemia, unspecified: Secondary | ICD-10-CM | POA: Diagnosis not present

## 2015-10-25 DIAGNOSIS — Z1389 Encounter for screening for other disorder: Secondary | ICD-10-CM | POA: Diagnosis not present

## 2015-10-25 DIAGNOSIS — R5383 Other fatigue: Secondary | ICD-10-CM | POA: Diagnosis not present

## 2015-10-27 ENCOUNTER — Encounter: Payer: Self-pay | Admitting: Cardiovascular Disease

## 2015-10-27 ENCOUNTER — Ambulatory Visit (INDEPENDENT_AMBULATORY_CARE_PROVIDER_SITE_OTHER): Payer: Medicare Other | Admitting: Cardiovascular Disease

## 2015-10-27 VITALS — BP 150/78 | HR 90 | Ht 63.5 in | Wt 137.5 lb

## 2015-10-27 DIAGNOSIS — I4949 Other premature depolarization: Secondary | ICD-10-CM

## 2015-10-27 DIAGNOSIS — R55 Syncope and collapse: Secondary | ICD-10-CM

## 2015-10-27 NOTE — Patient Instructions (Signed)
Dr Croitoru recommends that you follow-up with him as needed. 

## 2015-10-27 NOTE — Progress Notes (Signed)
Patient ID: Donna Burch, female   DOB: 1926-11-13, 82 y.o.   MRN: [id number]    Cardiology Office Note    Date:  10/27/2015   ID:  Donna Burch, DOB 09-23-1926, MRN [id number]  PCP:  Vidal Schwalbe, MD  Cardiologist:   Sanda Klein, MD   Chief Complaint  Patient presents with  . Follow-up    monitor followup    History of Present Illness:  Donna Burch is a 82 y.o. female returning in follow-up after an episode of syncope that required brief hospitalization. This was associated with nausea and vomiting, hypovolemia and orthostatic hypotension. She had brief junctional escape rhythm while in the hospital. Her echocardiogram showed no evidence of any structural cardiac abnormalities. A 30 day event monitor following hospital discharge showed normal rhythm without any significant bradycardia or junctional rhythm. There was no evidence of chronotropic incompetence or other signs of sinus node dysfunction. She feels well, is quite active and has returned to baseline activities.   Past Medical History  Diagnosis Date  . Elevated brain natriuretic peptide (BNP) level     Slight  . Hiatal hernia   . Hypertension   . Hyperlipidemia   . Nocturia     x3  . Gastroenteritis 09/2010    c. dificile.  hospitalized in ICU x 3 days  . Dyspnea on exertion     Improved with exercise  . Renal insufficiency   . Nephrolithiasis   . GERD (gastroesophageal reflux disease)   . History of Clostridium difficile ?03/2012    "after taking ATB for one of my UTI's"  . Wears glasses   . Seasonal allergies   . Skin cancer     right anterior neck  . PONV (postoperative nausea and vomiting)   . Family history of adverse reaction to anesthesia     son, Marita Snellen, w/PONV  . Pneumonia     Required hospital stay at the time  . Hypothyroidism   . Frequent UTI     "used to; none lately" (01/03/2015)  . Arthritis     "right middle finger; hands" (01/03/2015)  . CLL (chronic lymphocytic leukemia) ([place])      Past Surgical History  Procedure Laterality Date  . Cataract extraction, bilateral  2010  . Wisdom tooth extraction    . Lithotripsy      "didn't work"  . Esophagogastroduodenoscopy    . Esophagogastroduodenoscopy (egd) with esophageal dilation    . Colonoscopy    . Knee arthroscopy Left   . Thyroid lobectomy Right 01/03/2015  . Cystoscopy with stent placement    . Cystoscopy w/ stone manipulation Right   . Tonsillectomy    . Skin cancer excision Right     anterior neck  . Thyroid lobectomy Right 01/03/2015    Procedure: RIGHT THYROID LOBECTOMY;  Surgeon: Armandina Gemma, MD;  Location: [place];  Service: General;  Laterality: Right;    Current Medications: Outpatient Prescriptions Prior to Visit  Medication Sig Dispense Refill  . amLODipine (NORVASC) 2.5 MG tablet Take 1 tablet (2.5 mg total) by mouth daily. 30 tablet 0  . aspirin 81 MG tablet Take 81 mg by mouth daily.    [name] atorvastatin (LIPITOR) 80 MG tablet Take 80 mg by mouth daily at 6 PM.     . cholecalciferol (VITAMIN D) 1000 UNITS tablet Take 2,000 Units by mouth 2 (two) times daily.     [name] dextromethorphan-guaiFENesin (MUCINEX DM) 30-600 MG 12hr tablet Take 1 tablet by mouth 2 (two) times  daily. 10 tablet 0  . feeding supplement, ENSURE ENLIVE, (ENSURE ENLIVE) LIQD Take 237 mLs by mouth daily. 237 mL 12  . levothyroxine (SYNTHROID, LEVOTHROID) 50 MCG tablet     . magnesium chloride (SLOW-MAG) 64 MG TBEC SR tablet Take 1 tablet by mouth 2 (two) times daily.     . Nutritional Supplements (COLON FORMULA PO) Take 1 capsule by mouth daily.     [name] omeprazole (PRILOSEC) 20 MG capsule Take 20 mg by mouth daily.    [name] doxycycline (VIBRA-TABS) 100 MG tablet Take 1 tablet (100 mg total) by mouth every 12 (twelve) hours. (Patient not taking: Reported on 10/27/2015) 4 tablet 0  . sodium chloride 1 g tablet Take 1 tablet (1 g total) by mouth 2 (two) times daily with a meal. (Patient not taking: Reported on 10/27/2015) 10 tablet 0   No  facility-administered medications prior to visit.     Allergies:   Prednisone and Reclast   Social History   Social History  . Marital Status: Widowed    Spouse Name: N/A  . Number of Children: N/A  . Years of Education: N/A   Social History Main Topics  . Smoking status: Never Smoker   . Smokeless tobacco: Never Used  . Alcohol Use: No  . Drug Use: No  . Sexual Activity: No   Other Topics Concern  . None   Social History Narrative     Family History:  The patient's family history includes Asthma in her father; Diabetes in her father; Heart attack in her mother; Hyperlipidemia in her mother.   ROS:   Please see the history of present illness.    ROS All other systems reviewed and are negative.   PHYSICAL EXAM:   VS:  BP 150/78 mmHg  Pulse 90  Ht 5' 3.5" (1.613 m)  Wt 62.37 kg (137 lb 8 oz)  BMI 23.97 kg/m2   GEN: Well nourished, well developed, in no acute distress HEENT: normal Neck: no JVD, carotid bruits, or masses Cardiac: RRR; no murmurs, rubs, or gallops,no edema  Respiratory:  clear to auscultation bilaterally, normal work of breathing GI: soft, nontender, nondistended, + BS MS: no deformity or atrophy Skin: warm and dry, no rash Neuro:  Alert and Oriented x 3, Strength and sensation are intact Psych: euthymic mood, full affect  Wt Readings from Last 3 Encounters:  10/27/15 62.37 kg (137 lb 8 oz)  09/06/15 61.054 kg (134 lb 9.6 oz)  07/14/15 59.194 kg (130 lb 8 oz)      Studies/Labs Reviewed:   EKG:  EKG is not ordered today.    Recent Labs: 09/01/2015: B Natriuretic Peptide 46.2 09/02/2015: TSH 5.879* 09/05/2015: ALT 25; Hemoglobin 10.0*; Platelets 255 09/06/2015: BUN 22*; Creatinine, Ser 0.97; Magnesium 1.8; Potassium 3.9; Sodium 134*   Lipid Panel    Component Value Date/Time   CHOL 117 09/02/2015 0054   TRIG 27 09/02/2015 0054   HDL 48 09/02/2015 0054   CHOLHDL 2.4 09/02/2015 0054   VLDL 5 09/02/2015 0054   LDLCALC 64 09/02/2015 0054       ASSESSMENT:    1. Vasovagal syncope   2. Junctional escape rhythm      PLAN:  In order of problems listed above:  1. Her syncopal events seem to be a combination of vagal response to nausea and vomiting and hypovolemia with subsequent hypotension. It has not returned 2. Associated with the acute illness, no recurrence on 30 day event monitor.  No overt structural or  arrhythmic heart disease has been identified. She has not had recurrent syncope, once her GI symptoms resolved. She will follow-up as needed.  Medication Adjustments/Labs and Tests Ordered: Current medicines are reviewed at length with the patient today.  Concerns regarding medicines are outlined above.  Medication changes, Labs and Tests ordered today are listed in the Patient Instructions below. Patient Instructions  Dr Sallyanne Kuster recommends that you follow-up with him as needed.    Mikael Spray, MD  10/27/2015 1:17 PM    [facility], [place], [place]  [address] Phone: [phone]; Fax: [phone]

## 2015-11-13 DIAGNOSIS — R600 Localized edema: Secondary | ICD-10-CM | POA: Diagnosis not present

## 2016-01-11 ENCOUNTER — Other Ambulatory Visit: Payer: Self-pay | Admitting: *Deleted

## 2016-01-11 DIAGNOSIS — C9110 Chronic lymphocytic leukemia of B-cell type not having achieved remission: Secondary | ICD-10-CM

## 2016-01-12 ENCOUNTER — Encounter: Payer: Self-pay | Admitting: Hematology

## 2016-01-12 ENCOUNTER — Ambulatory Visit (HOSPITAL_BASED_OUTPATIENT_CLINIC_OR_DEPARTMENT_OTHER): Payer: Medicare Other | Admitting: Hematology

## 2016-01-12 ENCOUNTER — Telehealth: Payer: Self-pay | Admitting: Hematology

## 2016-01-12 ENCOUNTER — Other Ambulatory Visit (HOSPITAL_BASED_OUTPATIENT_CLINIC_OR_DEPARTMENT_OTHER): Payer: Medicare Other

## 2016-01-12 VITALS — BP 175/84 | HR 82 | Temp 98.2°F | Resp 18 | Ht 63.5 in | Wt 136.4 lb

## 2016-01-12 DIAGNOSIS — C9110 Chronic lymphocytic leukemia of B-cell type not having achieved remission: Secondary | ICD-10-CM

## 2016-01-12 DIAGNOSIS — D649 Anemia, unspecified: Secondary | ICD-10-CM | POA: Diagnosis not present

## 2016-01-12 LAB — CBC WITH DIFFERENTIAL/PLATELET
BASO%: 0.6 % (ref 0.0–2.0)
Basophils Absolute: 0.1 10*3/uL (ref 0.0–0.1)
EOS ABS: 0.3 10*3/uL (ref 0.0–0.5)
EOS%: 3.6 % (ref 0.0–7.0)
HEMATOCRIT: 33.6 % — AB (ref 34.8–46.6)
HGB: 11 g/dL — ABNORMAL LOW (ref 11.6–15.9)
LYMPH%: 47.1 % (ref 14.0–49.7)
MCH: 29.6 pg (ref 25.1–34.0)
MCHC: 32.8 g/dL (ref 31.5–36.0)
MCV: 90.5 fL (ref 79.5–101.0)
MONO#: 0.7 10*3/uL (ref 0.1–0.9)
MONO%: 7.6 % (ref 0.0–14.0)
NEUT%: 41.1 % (ref 38.4–76.8)
NEUTROS ABS: 4 10*3/uL (ref 1.5–6.5)
PLATELETS: 251 10*3/uL (ref 145–400)
RBC: 3.72 10*6/uL (ref 3.70–5.45)
RDW: 13.6 % (ref 11.2–14.5)
WBC: 9.7 10*3/uL (ref 3.9–10.3)
lymph#: 4.6 10*3/uL — ABNORMAL HIGH (ref 0.9–3.3)

## 2016-01-12 LAB — COMPREHENSIVE METABOLIC PANEL
ALK PHOS: 84 U/L (ref 40–150)
ALT: 13 U/L (ref 0–55)
ANION GAP: 9 meq/L (ref 3–11)
AST: 23 U/L (ref 5–34)
Albumin: 3.2 g/dL — ABNORMAL LOW (ref 3.5–5.0)
BILIRUBIN TOTAL: 0.5 mg/dL (ref 0.20–1.20)
BUN: 19.3 mg/dL (ref 7.0–26.0)
CALCIUM: 9.3 mg/dL (ref 8.4–10.4)
CO2: 22 meq/L (ref 22–29)
Chloride: 107 mEq/L (ref 98–109)
Creatinine: 1 mg/dL (ref 0.6–1.1)
EGFR: 53 mL/min/{1.73_m2} — AB (ref >90)
Glucose: 93 mg/dl (ref 70–140)
Potassium: 4.2 mEq/L (ref 3.5–5.1)
Sodium: 138 mEq/L (ref 136–145)
TOTAL PROTEIN: 7.1 g/dL (ref 6.4–8.3)

## 2016-01-12 NOTE — Telephone Encounter (Signed)
Gave pt cal & avs °

## 2016-01-14 NOTE — Progress Notes (Signed)
Donna Burch Kitchen  HEMATOLOGY ONCOLOGY PROGRESS NOTE  Date of service: .01/12/2016  Patient Care Team: Harlan Stains, MD as PCP - General (Family Medicine)  Diagnosis: CLL Rai Stage 0  Current Treatment: Observation  INTERVAL HISTORY: Ms Donna Burch is here for her scheduled follow-up along with her grandson.  She was admitted to the hospital in March with a syncopal event and that was thought to be due to dehydration from nausea vomiting and possible urinary tract infection.  CT of the head showed no acute findings. She is now of all her blood pressure medications. She was noted to have a junctional rhythm and cardiology was consulted regarding possible arrhythmia.  Echo was unremarkable.  Cardiology following her for Holter monitoring and further workup. Patient notes no fevers no chills no night sweats no unexpected weight loss.  No unusual new fatigue.  Feels like she is at her recent baseline.  Her WBC/lymphocyte count is stable.  No new clinical lymphadenopathy.    REVIEW OF SYSTEMS:    10 Point review of systems of done and is negative except as noted above.  . Past Medical History:  Diagnosis Date  . Arthritis    "right middle finger; hands" (01/03/2015)  . CLL (chronic lymphocytic leukemia) ([place])   . Dyspnea on exertion    Improved with exercise  . Elevated brain natriuretic peptide (BNP) level    Slight  . Family history of adverse reaction to anesthesia    son, Donna Burch, w/PONV  . Frequent UTI    "used to; none lately" (01/03/2015)  . Gastroenteritis 09/2010   c. dificile.  hospitalized in ICU x 3 days  . GERD (gastroesophageal reflux disease)   . Hiatal hernia   . History of Clostridium difficile ?03/2012   "after taking ATB for one of my UTI's"  . Hyperlipidemia   . Hypertension   . Hypothyroidism   . Nephrolithiasis   . Nocturia    x3  . Pneumonia    Required hospital stay at the time  . PONV (postoperative nausea and vomiting)   . Renal insufficiency   . Seasonal allergies   .  Skin cancer    right anterior neck  . Wears glasses     . Past Surgical History:  Procedure Laterality Date  . CATARACT EXTRACTION, BILATERAL  2010  . COLONOSCOPY    . CYSTOSCOPY W/ STONE MANIPULATION Right   . CYSTOSCOPY WITH STENT PLACEMENT    . ESOPHAGOGASTRODUODENOSCOPY    . ESOPHAGOGASTRODUODENOSCOPY (EGD) WITH ESOPHAGEAL DILATION    . KNEE ARTHROSCOPY Left   . LITHOTRIPSY     "didn't work"  . SKIN CANCER EXCISION Right    anterior neck  . THYROID LOBECTOMY Right 01/03/2015  . THYROID LOBECTOMY Right 01/03/2015   Procedure: RIGHT THYROID LOBECTOMY;  Surgeon: Armandina Gemma, MD;  Location: [facility];  Service: General;  Laterality: Right;  . TONSILLECTOMY    . WISDOM TOOTH EXTRACTION      . Social History  Substance Use Topics  . Smoking status: Never Smoker  . Smokeless tobacco: Never Used  . Alcohol use No    ALLERGIES:  is allergic to prednisone and reclast [zoledronic acid].  MEDICATIONS:  Current Outpatient Prescriptions  Medication Sig Dispense Refill  . amLODipine (NORVASC) 2.5 MG tablet Take 1 tablet (2.5 mg total) by mouth daily. 30 tablet 0  . aspirin 81 MG tablet Take 81 mg by mouth daily.    Donna Burch Kitchen atorvastatin (LIPITOR) 80 MG tablet Take 80 mg by mouth daily at 6  PM.     . cholecalciferol (VITAMIN D) 1000 UNITS tablet Take 2,000 Units by mouth 2 (two) times daily.     Donna Burch Kitchen dextromethorphan-guaiFENesin (MUCINEX DM) 30-600 MG 12hr tablet Take 1 tablet by mouth 2 (two) times daily. 10 tablet 0  . feeding supplement, ENSURE ENLIVE, (ENSURE ENLIVE) LIQD Take 237 mLs by mouth daily. 237 mL 12  . ferrous sulfate 325 (65 FE) MG EC tablet Take 325 mg by mouth daily with breakfast.    . levothyroxine (SYNTHROID, LEVOTHROID) 50 MCG tablet     . magnesium chloride (SLOW-MAG) 64 MG TBEC SR tablet Take 1 tablet by mouth 2 (two) times daily.     . Nutritional Supplements (COLON FORMULA PO) Take 1 capsule by mouth daily.     Donna Burch Kitchen omeprazole (PRILOSEC) 20 MG capsule Take 20 mg by mouth  daily.     No current facility-administered medications for this visit.     PHYSICAL EXAMINATION: ECOG PERFORMANCE STATUS: 2 - Symptomatic, <50% confined to bed  . Vitals:   01/12/16 1201  BP: (!) 175/84  Pulse: 82  Resp: 18  Temp: 98.2 F (36.8 C)    Filed Weights   01/12/16 1201  Weight: 136 lb 6.4 oz (61.9 kg)   .Body mass index is 23.78 kg/m.  GENERAL: elderly lady in no acute distress and comfortable . SKIN: skin color, texture, turgor are normal, no rashes or significant lesions EYES: normal, conjunctiva are pink and non-injected, sclera clear OROPHARYNX:no exudate, no erythema and lips, buccal mucosa, and tongue normal  NECK: supple, no JVD, thyroid normal size, non-tender, without nodularity LYMPH: no palpable lymphadenopathy in the cervical, axillary or inguinal LUNGS: clear to auscultation with normal respiratory effort HEART: regular rate & rhythm, no murmurs and no lower extremity edema ABDOMEN: abdomen soft, non-tender, normoactive bowel sounds  Musculoskeletal: no cyanosis of digits and no clubbing  PSYCH: alert & oriented x 3 with fluent speech NEURO: no focal motor/sensory deficits  LABORATORY DATA:   I have reviewed the data as listed  . CBC Latest Ref Rng & Units 01/12/2016 09/05/2015 09/03/2015  WBC 3.9 - 10.3 10e3/uL 9.7 10.3 9.3  Hemoglobin 11.6 - 15.9 g/dL 11.0(L) 10.0(L) 9.1(L)  Hematocrit 34.8 - 46.6 % 33.6(L) 30.1(L) 27.1(L)  Platelets 145 - 400 10e3/uL 251 255 255   . CBC    Component Value Date/Time   WBC 9.7 01/12/2016 1058   WBC 10.3 09/05/2015 0658   RBC 3.72 01/12/2016 1058   RBC 3.35 (L) 09/05/2015 0658   HGB 11.0 (L) 01/12/2016 1058   HCT 33.6 (L) 01/12/2016 1058   PLT 251 01/12/2016 1058   MCV 90.5 01/12/2016 1058   MCH 29.6 01/12/2016 1058   MCH 29.9 09/05/2015 0658   MCHC 32.8 01/12/2016 1058   MCHC 33.2 09/05/2015 0658   RDW 13.6 01/12/2016 1058   LYMPHSABS 4.6 (H) 01/12/2016 1058   MONOABS 0.7 01/12/2016 1058     EOSABS 0.3 01/12/2016 1058   BASOSABS 0.1 01/12/2016 1058     . CMP Latest Ref Rng & Units 01/12/2016 09/06/2015 09/05/2015  Glucose 70 - 140 mg/dl 93 87 100(H)  BUN 7.0 - 26.0 mg/dL 19.3 22(H) 16  Creatinine 0.6 - 1.1 mg/dL 1.0 0.97 1.00  Sodium 136 - 145 mEq/L 138 134(L) 133(L)  Potassium 3.5 - 5.1 mEq/L 4.2 3.9 4.4  Chloride 101 - 111 mmol/L - 102 99(L)  CO2 22 - 29 mEq/L 22 23 23   Calcium 8.4 - 10.4 mg/dL 9.3 8.8(L) 8.9  Total Protein 6.4 - 8.3 g/dL 7.1 - 6.0(L)  Total Bilirubin 0.20 - 1.20 mg/dL 0.50 - 0.5  Alkaline Phos 40 - 150 U/L 84 - 69  AST 5 - 34 U/L 23 - 32  ALT 0 - 55 U/L 13 - 25     RADIOGRAPHIC STUDIES: I have personally reviewed the radiological images as listed and agreed with the findings in the report. No results found.  ASSESSMENT & PLAN:   82 year old female with multiple medical comorbidities as noted above.  1) CLL. Rai stage 0. FISH panel shows 13q deletion -likely representing good prognostic indicator Patient has no discernible lymphadenopathy, splenomegaly. Has mild anemia which remains stable but this seems to be less likely from CLL. Given her pelgeroid neutrophils and acanthocytes cannot rule out some element of age-related myelodysplasia. No thrombocytopenia noted. Patient has no overt constitutional symptoms such as fevers/chills/drenching night sweats/weight loss more than 10% of body weight. WBC counts today are down to 9.7k. And lymphocyte counts is down from 7.1k to 5.1k and now to 4.6k Plan. -No indication for treatment of her CLL at this time. -Continue follow-up with primary care physician for management of her other medical co-morbidities. -we shall recheck her blood counts in 6 months to ensure stability.  Return to care in 6 months with repeat CBC, CMP. Earlier follow-up if any new concerns or questions.  I spent 15 minutes counseling the patient face to face. The total time spent in the appointment was 20 minutes and more than 50%  was on counseling and direct patient cares.    Sullivan Lone MD [place] AAHIVMS [facility] Hematology/Oncology Physician [facility]  (Office):       [phone] (Work cell):  [phone] (Fax):           [phone]

## 2016-01-31 DIAGNOSIS — C9110 Chronic lymphocytic leukemia of B-cell type not having achieved remission: Secondary | ICD-10-CM | POA: Diagnosis not present

## 2016-01-31 DIAGNOSIS — N183 Chronic kidney disease, stage 3 (moderate): Secondary | ICD-10-CM | POA: Diagnosis not present

## 2016-01-31 DIAGNOSIS — I129 Hypertensive chronic kidney disease with stage 1 through stage 4 chronic kidney disease, or unspecified chronic kidney disease: Secondary | ICD-10-CM | POA: Diagnosis not present

## 2016-01-31 DIAGNOSIS — M2550 Pain in unspecified joint: Secondary | ICD-10-CM | POA: Diagnosis not present

## 2016-01-31 DIAGNOSIS — R609 Edema, unspecified: Secondary | ICD-10-CM | POA: Diagnosis not present

## 2016-02-02 DIAGNOSIS — M25562 Pain in left knee: Secondary | ICD-10-CM | POA: Diagnosis not present

## 2016-02-02 DIAGNOSIS — Z113 Encounter for screening for infections with a predominantly sexual mode of transmission: Secondary | ICD-10-CM | POA: Diagnosis not present

## 2016-02-02 DIAGNOSIS — M79642 Pain in left hand: Secondary | ICD-10-CM | POA: Diagnosis not present

## 2016-02-02 DIAGNOSIS — M79641 Pain in right hand: Secondary | ICD-10-CM | POA: Diagnosis not present

## 2016-02-02 DIAGNOSIS — Z9225 Personal history of immunosupression therapy: Secondary | ICD-10-CM | POA: Diagnosis not present

## 2016-02-02 DIAGNOSIS — M25512 Pain in left shoulder: Secondary | ICD-10-CM | POA: Diagnosis not present

## 2016-02-02 DIAGNOSIS — M25521 Pain in right elbow: Secondary | ICD-10-CM | POA: Diagnosis not present

## 2016-02-02 DIAGNOSIS — M79671 Pain in right foot: Secondary | ICD-10-CM | POA: Diagnosis not present

## 2016-02-02 DIAGNOSIS — M25522 Pain in left elbow: Secondary | ICD-10-CM | POA: Diagnosis not present

## 2016-02-02 DIAGNOSIS — R5381 Other malaise: Secondary | ICD-10-CM | POA: Diagnosis not present

## 2016-02-02 DIAGNOSIS — M25511 Pain in right shoulder: Secondary | ICD-10-CM | POA: Diagnosis not present

## 2016-02-02 DIAGNOSIS — Z79899 Other long term (current) drug therapy: Secondary | ICD-10-CM | POA: Diagnosis not present

## 2016-02-02 DIAGNOSIS — M25561 Pain in right knee: Secondary | ICD-10-CM | POA: Diagnosis not present

## 2016-02-02 DIAGNOSIS — M79672 Pain in left foot: Secondary | ICD-10-CM | POA: Diagnosis not present

## 2016-02-02 DIAGNOSIS — M2550 Pain in unspecified joint: Secondary | ICD-10-CM | POA: Diagnosis not present

## 2016-02-05 DIAGNOSIS — Z8639 Personal history of other endocrine, nutritional and metabolic disease: Secondary | ICD-10-CM | POA: Diagnosis not present

## 2016-02-05 DIAGNOSIS — E039 Hypothyroidism, unspecified: Secondary | ICD-10-CM | POA: Diagnosis not present

## 2016-02-21 DIAGNOSIS — I129 Hypertensive chronic kidney disease with stage 1 through stage 4 chronic kidney disease, or unspecified chronic kidney disease: Secondary | ICD-10-CM | POA: Diagnosis not present

## 2016-03-01 DIAGNOSIS — M25511 Pain in right shoulder: Secondary | ICD-10-CM | POA: Diagnosis not present

## 2016-03-01 DIAGNOSIS — Z09 Encounter for follow-up examination after completed treatment for conditions other than malignant neoplasm: Secondary | ICD-10-CM | POA: Diagnosis not present

## 2016-03-01 DIAGNOSIS — Z79899 Other long term (current) drug therapy: Secondary | ICD-10-CM | POA: Diagnosis not present

## 2016-03-01 DIAGNOSIS — M25521 Pain in right elbow: Secondary | ICD-10-CM | POA: Diagnosis not present

## 2016-03-01 DIAGNOSIS — M1990 Unspecified osteoarthritis, unspecified site: Secondary | ICD-10-CM | POA: Diagnosis not present

## 2016-03-05 DIAGNOSIS — M0609 Rheumatoid arthritis without rheumatoid factor, multiple sites: Secondary | ICD-10-CM | POA: Diagnosis not present

## 2016-03-05 DIAGNOSIS — H35363 Drusen (degenerative) of macula, bilateral: Secondary | ICD-10-CM | POA: Diagnosis not present

## 2016-03-05 DIAGNOSIS — H3589 Other specified retinal disorders: Secondary | ICD-10-CM | POA: Diagnosis not present

## 2016-03-05 DIAGNOSIS — Z79899 Other long term (current) drug therapy: Secondary | ICD-10-CM | POA: Diagnosis not present

## 2016-03-07 DIAGNOSIS — M0609 Rheumatoid arthritis without rheumatoid factor, multiple sites: Secondary | ICD-10-CM | POA: Diagnosis not present

## 2016-03-07 DIAGNOSIS — Z961 Presence of intraocular lens: Secondary | ICD-10-CM | POA: Diagnosis not present

## 2016-03-07 DIAGNOSIS — Z79899 Other long term (current) drug therapy: Secondary | ICD-10-CM | POA: Diagnosis not present

## 2016-03-07 DIAGNOSIS — H35363 Drusen (degenerative) of macula, bilateral: Secondary | ICD-10-CM | POA: Diagnosis not present

## 2016-03-07 DIAGNOSIS — H3589 Other specified retinal disorders: Secondary | ICD-10-CM | POA: Diagnosis not present

## 2016-04-12 DIAGNOSIS — Z79899 Other long term (current) drug therapy: Secondary | ICD-10-CM | POA: Diagnosis not present

## 2016-04-12 DIAGNOSIS — H524 Presbyopia: Secondary | ICD-10-CM | POA: Diagnosis not present

## 2016-04-12 DIAGNOSIS — Z961 Presence of intraocular lens: Secondary | ICD-10-CM | POA: Diagnosis not present

## 2016-04-25 ENCOUNTER — Encounter: Payer: Self-pay | Admitting: Rheumatology

## 2016-04-25 DIAGNOSIS — Z79899 Other long term (current) drug therapy: Secondary | ICD-10-CM | POA: Insufficient documentation

## 2016-05-03 DIAGNOSIS — Z23 Encounter for immunization: Secondary | ICD-10-CM | POA: Diagnosis not present

## 2016-05-21 DIAGNOSIS — I129 Hypertensive chronic kidney disease with stage 1 through stage 4 chronic kidney disease, or unspecified chronic kidney disease: Secondary | ICD-10-CM | POA: Diagnosis not present

## 2016-05-21 DIAGNOSIS — M810 Age-related osteoporosis without current pathological fracture: Secondary | ICD-10-CM | POA: Diagnosis not present

## 2016-05-21 DIAGNOSIS — C9110 Chronic lymphocytic leukemia of B-cell type not having achieved remission: Secondary | ICD-10-CM | POA: Diagnosis not present

## 2016-05-21 DIAGNOSIS — E785 Hyperlipidemia, unspecified: Secondary | ICD-10-CM | POA: Diagnosis not present

## 2016-05-21 DIAGNOSIS — I714 Abdominal aortic aneurysm, without rupture: Secondary | ICD-10-CM | POA: Diagnosis not present

## 2016-05-21 DIAGNOSIS — E890 Postprocedural hypothyroidism: Secondary | ICD-10-CM | POA: Diagnosis not present

## 2016-05-21 DIAGNOSIS — M130 Polyarthritis, unspecified: Secondary | ICD-10-CM | POA: Diagnosis not present

## 2016-05-21 DIAGNOSIS — H6983 Other specified disorders of Eustachian tube, bilateral: Secondary | ICD-10-CM | POA: Diagnosis not present

## 2016-05-21 DIAGNOSIS — N183 Chronic kidney disease, stage 3 (moderate): Secondary | ICD-10-CM | POA: Diagnosis not present

## 2016-05-21 DIAGNOSIS — Z0000 Encounter for general adult medical examination without abnormal findings: Secondary | ICD-10-CM | POA: Diagnosis not present

## 2016-05-23 ENCOUNTER — Other Ambulatory Visit: Payer: Self-pay | Admitting: Family Medicine

## 2016-05-23 DIAGNOSIS — I714 Abdominal aortic aneurysm, without rupture, unspecified: Secondary | ICD-10-CM

## 2016-05-29 DIAGNOSIS — Z111 Encounter for screening for respiratory tuberculosis: Secondary | ICD-10-CM | POA: Insufficient documentation

## 2016-05-29 DIAGNOSIS — M2550 Pain in unspecified joint: Secondary | ICD-10-CM | POA: Insufficient documentation

## 2016-05-29 DIAGNOSIS — M1990 Unspecified osteoarthritis, unspecified site: Secondary | ICD-10-CM | POA: Insufficient documentation

## 2016-05-29 NOTE — Progress Notes (Signed)
Office Visit Note  Patient: Donna Burch             Date of Birth: [birth year]           MRN: [id number]             PCP: Vidal Schwalbe, MD Referring: Harlan Stains, MD Visit Date: 05/31/2016 Occupation: @GUAROCC @    Subjective:  Pain of the Right Knee; Pain of the Left Knee; Pain of the Right Hand; and Pain of the Left Hand   History of Present Illness: Donna Burch is a 82 y.o. female  Last seen 03/01/2016 Doing well with inflammatory polyarthritis. Taking Plaquenil as prescribed at 200 mg in the morning and 109. Had Plaquenil eye exam approximately September 2017 and was normal. We'll go back in 6 months from that to get a repeat examination.   Activities of Daily Living:  Patient reports morning stiffness for 45 minutes.   Patient Reports nocturnal pain.  Difficulty dressing/grooming: Reports Difficulty climbing stairs: Reports Difficulty getting out of chair: Reports Difficulty using hands for taps, buttons, cutlery, and/or writing: Reports   Review of Systems  Constitutional: Negative for fatigue.  HENT: Negative for mouth sores and mouth dryness.   Eyes: Negative for dryness.  Respiratory: Negative for shortness of breath.   Gastrointestinal: Negative for constipation and diarrhea.  Musculoskeletal: Negative for myalgias and myalgias.  Skin: Negative for sensitivity to sunlight.  Psychiatric/Behavioral: Negative for decreased concentration and sleep disturbance.    PMFS History:  Patient Active Problem List   Diagnosis Date Noted  . Inflammatory arthritis 05/29/2016  . Pain in joint involving multiple sites 05/29/2016  . Screening-pulmonary TB 05/29/2016  . High risk medication use 04/25/2016  . Bradycardia   . Symptomatic bradycardia   . Junctional escape rhythm   . Orthostatic hypotension   . Thyroid activity decreased   . Hypomagnesemia   . Syncope 09/01/2015  . Nausea & vomiting 09/01/2015  . Hyponatremia 09/01/2015  . Cough 09/01/2015    . Hypertension   . Hyperlipidemia   . GERD (gastroesophageal reflux disease)   . Hypothyroidism   . Gastroesophageal reflux disease without esophagitis   . CLL (chronic lymphocytic leukemia) ([place]) 07/14/2015  . Thyroid nodule, right 01/03/2015  . Thyroid nodule 01/03/2015  . Pelvic relaxation 04/08/2012  . Kidney stones 04/08/2012  . Recurrent UTI (urinary tract infection) 04/08/2012    Past Medical History:  Diagnosis Date  . Arthritis    "right middle finger; hands" (01/03/2015)  . CLL (chronic lymphocytic leukemia) ([place])   . Dyspnea on exertion    Improved with exercise  . Elevated brain natriuretic peptide (BNP) level    Slight  . Family history of adverse reaction to anesthesia    son, Marita Snellen, w/PONV  . Frequent UTI    "used to; none lately" (01/03/2015)  . Gastroenteritis 09/2010   c. dificile.  hospitalized in ICU x 3 days  . GERD (gastroesophageal reflux disease)   . Hiatal hernia   . History of Clostridium difficile ?03/2012   "after taking ATB for one of my UTI's"  . Hyperlipidemia   . Hypertension   . Hypothyroidism   . Nephrolithiasis   . Nocturia    x3  . Pneumonia    Required hospital stay at the time  . PONV (postoperative nausea and vomiting)   . Renal insufficiency   . Seasonal allergies   . Skin cancer    right anterior neck  . Wears glasses  Family History  Problem Relation Age of Onset  . Heart attack Mother   . Hyperlipidemia Mother   . Asthma Father   . Diabetes Father    Past Surgical History:  Procedure Laterality Date  . CATARACT EXTRACTION, BILATERAL  2010  . COLONOSCOPY    . CYSTOSCOPY W/ STONE MANIPULATION Right   . CYSTOSCOPY WITH STENT PLACEMENT    . ESOPHAGOGASTRODUODENOSCOPY    . ESOPHAGOGASTRODUODENOSCOPY (EGD) WITH ESOPHAGEAL DILATION    . KNEE ARTHROSCOPY Left   . LITHOTRIPSY     "didn't work"  . SKIN CANCER EXCISION Right    anterior neck  . THYROID LOBECTOMY Right 01/03/2015  . THYROID LOBECTOMY Right 01/03/2015    Procedure: RIGHT THYROID LOBECTOMY;  Surgeon: Armandina Gemma, MD;  Location: [facility];  Service: General;  Laterality: Right;  . TONSILLECTOMY    . WISDOM TOOTH EXTRACTION     Social History   Social History Narrative  . No narrative on file     Objective: Vital Signs: BP (!) 146/82   Pulse 80   Ht 5' 3.5" (1.613 m)   Wt 144 lb (65.3 kg)   BMI 25.11 kg/m    Physical Exam  Constitutional: She is oriented to person, place, and time. She appears well-developed and well-nourished.  HENT:  Head: Normocephalic and atraumatic.  Eyes: EOM are normal. Pupils are equal, round, and reactive to light.  Cardiovascular: Normal rate, regular rhythm and normal heart sounds.  Exam reveals no gallop and no friction rub.   No murmur heard. Pulmonary/Chest: Effort normal and breath sounds normal. She has no wheezes. She has no rales.  Abdominal: Soft. Bowel sounds are normal. She exhibits no distension. There is no tenderness. There is no guarding. No hernia.  Musculoskeletal: Normal range of motion. She exhibits no edema, tenderness or deformity.  Lymphadenopathy:    She has no cervical adenopathy.  Neurological: She is alert and oriented to person, place, and time. Coordination normal.  Skin: Skin is warm and dry. Capillary refill takes less than 2 seconds. No rash noted.  Psychiatric: She has a normal mood and affect. Her behavior is normal.  Nursing note and vitals reviewed.    Musculoskeletal Exam:  Decreased range of motion of most joints secondary to age and osteoarthritis. The 90 of abduction of the shoulder joint is now approximately 50-60. Elbow joints have 20 of contracture Decreased range of motion of bilateral wrists. Grip strength is equal and strong bilaterally but patient is unable to make a fist formation because she has to manually flex her PIP and DIP joints in order for the hand to make a fist.  Fibromyalgia tender points are all absent  CDAI Exam: CDAI Homunculus Exam:    Swelling:  Right hand: 1st MCP, 2nd MCP, 3rd MCP, 4th MCP and 5th MCP Left hand: 1st MCP, 2nd MCP, 3rd MCP, 4th MCP and 5th MCP  Joint Counts:  CDAI Tender Joint count: 0 CDAI Swollen Joint count: 10  Global Assessments:  Patient Global Assessment: 3 Provider Global Assessment: 3  CDAI Calculated Score: 16  Patient has old damage to all MCPs causing synovial thickening. No active synovitis. However as I'm examining her second MCP bilaterally on the lateral aspect of the second MCP, there is mild warmth. I'm not sure if that warmth is from poor control of her poly-arthritis or is it good circulation of blood in that area. We will watch the patient's MCP joints in the future visit in 3 months  Patient  has stiffness in her right hip and right knee joint   Investigation: Findings:  02/02/2016  x-ray of bilateral hands 2 views which showed all PIP and DIP narrowing.  She had soft tissue swelling in all of her PIP joints.  All MCP joints were narrowed especially the bilateral 2nd and 3rd MCPs.  She has intercarpal joint and radiocarpal joint space narrowing and questionable ulnar steroid erosion was noted in bilateral ulnar steroid area.  These findings were consistent with inflammatory arthritis most likely rheumatoid arthritis.    Bilateral feet x-rays showed bilateral PIP, DIP narrowing.  All MTPs subluxed and bilateral calcaneal spurs consistent with inflammatory arthritis most likely rheumatoid arthritis.   Bilateral knee joint x-rays 2 views showed right severe medial compartment narrowing and left lateral tibial compartment narrowing and patellofemoral narrowing consistent with severe osteoarthritis.  Bilateral elbow joint x-rays showed bilateral elbow space narrowing and no erosive changes could be consistent with inflammatory arthritis.   Bilateral shoulder joint x-rays were within normal limits except for left high rising humerus but no glenohumeral joint space narrowing was  noted.    Labs from February 02, 2016, show CMP with GFR is normal except for elevated creatinine at 0.97.  Decreased GFR at 57.  Albumin low at 35.  CBC with diff is normal.  CK is normal.  Hep panel is negative.  G6PD is normal.  HIV normal.  CCP normal and 14-3-3- eta is normal.  TB Gold is indeterm.  Sed rate elevated at 51.  Immunoglobulin G and A are decreased.  IgM is elevated at 1188.  Note, the patient has CLL and is seeing Dr. Hezzie Bump for that.  This is probably contributing to the immunoglobulin abnormality as well as a sed rate abnormality.  03/05/2016 normal Plaquenil eye exam     Imaging: No results found.  Speciality Comments: No specialty comments available.    Procedures:  No procedures performed Allergies: Prednisone and Reclast [zoledronic acid]   Assessment / Plan:     Visit Diagnoses: High risk medication use - Plaquenil 200mg  per day - Plan: CBC with Differential/Platelet, COMPLETE METABOLIC PANEL WITH GFR, VITAMIN D 25 Hydroxy (Vit-D Deficiency, Fractures)  Inflammatory arthritis  Pain in joint involving multiple sites  Contracture of elbow joint, left  Contracture, elbow, right  Gastroesophageal reflux disease without esophagitis  Essential hypertension  Symptomatic bradycardia  CLL (chronic lymphocytic leukemia) ([facility])  Screening-pulmonary TB  Vitamin D deficiency - Plan: VITAMIN D 25 Hydroxy (Vit-D Deficiency, Fractures)   I offered patient physical therapy and patient is agreeable: Evaluate and treat: Bilateral hands, wrist joint, elbow joint, shoulder joint, hip joint, knee joint, ankle joint, feet with stiffness and pain Abnormality of gait/balance issues. Patient has the option of going forward with the physical therapy if she prefers. Having fair amount of stiffness in most joints especially the right hip and the right knee consistent with osteoarthritis. Also feels like she might fall at times secondary to that stiffness. I offered her  physical therapy in the hopes that with proper exercises and muscle strengthening aerobic decreased risk for falls. Patient understands and is agreeable. She still drives and she may be able to go on a regular basis for the physical therapy if needed. I've explained to the patient that if she goes minimally but learns all the right exercises and does them at home that will be adequate if she continues them as prescribed by the physical therapist.   Bilateral MCP with slight warmth but no redness  All MCPs have synovial thickening and old damage and enlargement Also patient is having trouble with making a fist formation secondary to the PIP and DIP joints not automatically bending without manually manipulating those joints to make a fist.  Patient has adequate Plaquenil supply at home at this time.  Since patient has a history of vitamin D deficiency, on today's CBC with differential CMP with GFR, I will add vitamin D 25 OH. She is on vitamin D 1000 IUs in the morning and again in the evening.  She will need Plaquenil eye exam again 6 months from the last examination and then yearly   Orders: Orders Placed This Encounter  Procedures  . CBC with Differential/Platelet  . COMPLETE METABOLIC PANEL WITH GFR  . VITAMIN D 25 Hydroxy (Vit-D Deficiency, Fractures)   No orders of the defined types were placed in this encounter.   Face-to-face time spent with patient was 50 minutes. 50% of time was spent in counseling and coordination of care.  Follow-Up Instructions: Return in about 3 months (around 08/29/2016) for RA,plq 200am 100pm, oa hands & feet&knees, Right hip & knee pain and stifnes.   Eliezer Lofts, PA-C

## 2016-05-31 ENCOUNTER — Ambulatory Visit (INDEPENDENT_AMBULATORY_CARE_PROVIDER_SITE_OTHER): Payer: Medicare Other | Admitting: Rheumatology

## 2016-05-31 ENCOUNTER — Encounter: Payer: Self-pay | Admitting: Rheumatology

## 2016-05-31 VITALS — BP 146/82 | HR 80 | Ht 63.5 in | Wt 144.0 lb

## 2016-05-31 DIAGNOSIS — M24522 Contracture, left elbow: Secondary | ICD-10-CM

## 2016-05-31 DIAGNOSIS — E559 Vitamin D deficiency, unspecified: Secondary | ICD-10-CM

## 2016-05-31 DIAGNOSIS — M24521 Contracture, right elbow: Secondary | ICD-10-CM

## 2016-05-31 DIAGNOSIS — M2550 Pain in unspecified joint: Secondary | ICD-10-CM | POA: Diagnosis not present

## 2016-05-31 DIAGNOSIS — R001 Bradycardia, unspecified: Secondary | ICD-10-CM | POA: Diagnosis not present

## 2016-05-31 DIAGNOSIS — M1380 Other specified arthritis, unspecified site: Secondary | ICD-10-CM

## 2016-05-31 DIAGNOSIS — C9110 Chronic lymphocytic leukemia of B-cell type not having achieved remission: Secondary | ICD-10-CM | POA: Diagnosis not present

## 2016-05-31 DIAGNOSIS — Z111 Encounter for screening for respiratory tuberculosis: Secondary | ICD-10-CM

## 2016-05-31 DIAGNOSIS — Z79899 Other long term (current) drug therapy: Secondary | ICD-10-CM | POA: Diagnosis not present

## 2016-05-31 DIAGNOSIS — M1990 Unspecified osteoarthritis, unspecified site: Secondary | ICD-10-CM | POA: Diagnosis not present

## 2016-05-31 DIAGNOSIS — I10 Essential (primary) hypertension: Secondary | ICD-10-CM

## 2016-05-31 DIAGNOSIS — K219 Gastro-esophageal reflux disease without esophagitis: Secondary | ICD-10-CM

## 2016-05-31 LAB — CBC WITH DIFFERENTIAL/PLATELET
BASOS ABS: 0 {cells}/uL (ref 0–200)
Basophils Relative: 0 %
EOS ABS: 264 {cells}/uL (ref 15–500)
EOS PCT: 3 %
HCT: 32.2 % — ABNORMAL LOW (ref 35.0–45.0)
Hemoglobin: 10.5 g/dL — ABNORMAL LOW (ref 11.7–15.5)
LYMPHS PCT: 51 %
Lymphs Abs: 4488 cells/uL — ABNORMAL HIGH (ref 850–3900)
MCH: 29.5 pg (ref 27.0–33.0)
MCHC: 32.6 g/dL (ref 32.0–36.0)
MCV: 90.4 fL (ref 80.0–100.0)
MONOS PCT: 7 %
MPV: 8.7 fL (ref 7.5–12.5)
Monocytes Absolute: 616 cells/uL (ref 200–950)
Neutro Abs: 3432 cells/uL (ref 1500–7800)
Neutrophils Relative %: 39 %
PLATELETS: 259 10*3/uL (ref 140–400)
RBC: 3.56 MIL/uL — ABNORMAL LOW (ref 3.80–5.10)
RDW: 14 % (ref 11.0–15.0)
WBC: 8.8 10*3/uL (ref 3.8–10.8)

## 2016-06-01 LAB — COMPLETE METABOLIC PANEL WITH GFR
ALT: 14 U/L (ref 6–29)
AST: 23 U/L (ref 10–35)
Albumin: 3.4 g/dL — ABNORMAL LOW (ref 3.6–5.1)
Alkaline Phosphatase: 75 U/L (ref 33–130)
BUN: 23 mg/dL (ref 7–25)
CHLORIDE: 107 mmol/L (ref 98–110)
CO2: 20 mmol/L (ref 20–31)
Calcium: 8.9 mg/dL (ref 8.6–10.4)
Creat: 1.03 mg/dL — ABNORMAL HIGH (ref 0.60–0.88)
GFR, EST AFRICAN AMERICAN: 56 mL/min — AB (ref >=60)
GFR, EST NON AFRICAN AMERICAN: 49 mL/min — AB (ref >=60)
Glucose, Bld: 115 mg/dL — ABNORMAL HIGH (ref 65–99)
POTASSIUM: 4.3 mmol/L (ref 3.5–5.3)
SODIUM: 141 mmol/L (ref 135–146)
Total Bilirubin: 0.3 mg/dL (ref 0.2–1.2)
Total Protein: 6.7 g/dL (ref 6.1–8.1)

## 2016-06-01 LAB — VITAMIN D 25 HYDROXY (VIT D DEFICIENCY, FRACTURES): Vit D, 25-Hydroxy: 49 ng/mL (ref 30–100)

## 2016-06-03 NOTE — Progress Notes (Signed)
Tell patient:  #1: Vitamin D is normal at 49.#2: CMP with GFR is within normal limits except creatinine slightly elevated at 1.03 which we will monitor.GFR is slightly low at 49. This is consistent with patient's age.#3: CBC with differential is within normal limits which shows mild anemia and which we can monitor.Please send copy of this lab to patient's PCP

## 2016-06-25 ENCOUNTER — Ambulatory Visit
Admission: RE | Admit: 2016-06-25 | Discharge: 2016-06-25 | Disposition: A | Discharge status: Home / Self Care | Payer: Medicare Other | Source: Ambulatory Visit | Attending: Family Medicine | Admitting: Family Medicine

## 2016-06-25 DIAGNOSIS — I714 Abdominal aortic aneurysm, without rupture, unspecified: Secondary | ICD-10-CM

## 2016-07-19 ENCOUNTER — Other Ambulatory Visit (HOSPITAL_BASED_OUTPATIENT_CLINIC_OR_DEPARTMENT_OTHER): Payer: Medicare Other

## 2016-07-19 ENCOUNTER — Ambulatory Visit (HOSPITAL_BASED_OUTPATIENT_CLINIC_OR_DEPARTMENT_OTHER): Payer: Medicare Other | Admitting: Hematology

## 2016-07-19 ENCOUNTER — Telehealth: Payer: Self-pay | Admitting: Hematology

## 2016-07-19 ENCOUNTER — Encounter: Payer: Self-pay | Admitting: Hematology

## 2016-07-19 VITALS — BP 178/77 | HR 88 | Temp 98.4°F | Resp 17 | Ht 63.5 in | Wt 135.0 lb

## 2016-07-19 DIAGNOSIS — C9110 Chronic lymphocytic leukemia of B-cell type not having achieved remission: Secondary | ICD-10-CM

## 2016-07-19 DIAGNOSIS — D649 Anemia, unspecified: Secondary | ICD-10-CM

## 2016-07-19 DIAGNOSIS — M069 Rheumatoid arthritis, unspecified: Secondary | ICD-10-CM

## 2016-07-19 LAB — COMPREHENSIVE METABOLIC PANEL
ALBUMIN: 3.3 g/dL — AB (ref 3.5–5.0)
ALK PHOS: 104 U/L (ref 40–150)
ALT: 19 U/L (ref 0–55)
AST: 27 U/L (ref 5–34)
Anion Gap: 8 mEq/L (ref 3–11)
BUN: 21.6 mg/dL (ref 7.0–26.0)
CO2: 22 mEq/L (ref 22–29)
Calcium: 9.5 mg/dL (ref 8.4–10.4)
Chloride: 108 mEq/L (ref 98–109)
Creatinine: 0.8 mg/dL (ref 0.6–1.1)
EGFR: 62 mL/min/{1.73_m2} — ABNORMAL LOW (ref >90)
GLUCOSE: 93 mg/dL (ref 70–140)
POTASSIUM: 4.2 meq/L (ref 3.5–5.1)
SODIUM: 139 meq/L (ref 136–145)
Total Bilirubin: 0.56 mg/dL (ref 0.20–1.20)
Total Protein: 7.2 g/dL (ref 6.4–8.3)

## 2016-07-19 LAB — LACTATE DEHYDROGENASE: LDH: 191 U/L (ref 125–245)

## 2016-07-19 LAB — CBC & DIFF AND RETIC
BASO%: 0.3 % (ref 0.0–2.0)
Basophils Absolute: 0 10*3/uL (ref 0.0–0.1)
EOS ABS: 0.3 10*3/uL (ref 0.0–0.5)
EOS%: 2.3 % (ref 0.0–7.0)
HCT: 31.5 % — ABNORMAL LOW (ref 34.8–46.6)
HEMOGLOBIN: 10.5 g/dL — AB (ref 11.6–15.9)
IMMATURE RETIC FRACT: 3.3 % (ref 1.60–10.00)
LYMPH#: 5.6 10*3/uL — AB (ref 0.9–3.3)
LYMPH%: 52.3 % — ABNORMAL HIGH (ref 14.0–49.7)
MCH: 30.2 pg (ref 25.1–34.0)
MCHC: 33.3 g/dL (ref 31.5–36.0)
MCV: 90.5 fL (ref 79.5–101.0)
MONO#: 0.8 10*3/uL (ref 0.1–0.9)
MONO%: 7 % (ref 0.0–14.0)
NEUT%: 38.1 % — ABNORMAL LOW (ref 38.4–76.8)
NEUTROS ABS: 4.1 10*3/uL (ref 1.5–6.5)
Platelets: 232 10*3/uL (ref 145–400)
RBC: 3.48 10*6/uL — ABNORMAL LOW (ref 3.70–5.45)
RDW: 14.1 % (ref 11.2–14.5)
RETIC %: 0.99 % (ref 0.70–2.10)
Retic Ct Abs: 34.45 10*3/uL (ref 33.70–90.70)
WBC: 10.7 10*3/uL — AB (ref 3.9–10.3)

## 2016-07-19 NOTE — Telephone Encounter (Signed)
Appointments scheduled per 1/25 LOS. Patient given AVS report and calendars with future scheduled appointments. °

## 2016-07-19 NOTE — Progress Notes (Signed)
[name]  HEMATOLOGY ONCOLOGY PROGRESS NOTE  Date of service: .07/19/2016  Patient Care Team: Harlan Stains, MD as PCP - General (Family Medicine)  Diagnosis: CLL Rai Stage 0  Current Treatment: Observation  INTERVAL HISTORY: Donna Burch is here for her scheduled follow-up along with her family member. Notes no fevers, chills, night sweats or unexpected weight loss. Her blood counts are fairly stable with no evidence of symptomatic progression of her CLL. Notes issues with joint pains related to her rheumatoid arthritis.  REVIEW OF SYSTEMS:    10 Point review of systems of done and is negative except as noted above.  . Past Medical History:  Diagnosis Date  . Arthritis    "right middle finger; hands" (01/03/2015)  . CLL (chronic lymphocytic leukemia) ([place])   . Dyspnea on exertion    Improved with exercise  . Elevated brain natriuretic peptide (BNP) level    Slight  . Family history of adverse reaction to anesthesia    son, Marita Snellen, w/PONV  . Frequent UTI    "used to; none lately" (01/03/2015)  . Gastroenteritis 09/2010   c. dificile.  hospitalized in ICU x 3 days  . GERD (gastroesophageal reflux disease)   . Hiatal hernia   . History of Clostridium difficile ?03/2012   "after taking ATB for one of my UTI's"  . Hyperlipidemia   . Hypertension   . Hypothyroidism   . Nephrolithiasis   . Nocturia    x3  . Pneumonia    Required hospital stay at the time  . PONV (postoperative nausea and vomiting)   . Renal insufficiency   . Seasonal allergies   . Skin cancer    right anterior neck  . Wears glasses     . Past Surgical History:  Procedure Laterality Date  . CATARACT EXTRACTION, BILATERAL  2010  . COLONOSCOPY    . CYSTOSCOPY W/ STONE MANIPULATION Right   . CYSTOSCOPY WITH STENT PLACEMENT    . ESOPHAGOGASTRODUODENOSCOPY    . ESOPHAGOGASTRODUODENOSCOPY (EGD) WITH ESOPHAGEAL DILATION    . KNEE ARTHROSCOPY Left   . LITHOTRIPSY     "didn't work"  . SKIN CANCER EXCISION Right      anterior neck  . THYROID LOBECTOMY Right 01/03/2015  . THYROID LOBECTOMY Right 01/03/2015   Procedure: RIGHT THYROID LOBECTOMY;  Surgeon: Armandina Gemma, MD;  Location: [facility];  Service: General;  Laterality: Right;  . TONSILLECTOMY    . WISDOM TOOTH EXTRACTION      . Social History  Substance Use Topics  . Smoking status: Never Smoker  . Smokeless tobacco: Never Used  . Alcohol use No    ALLERGIES:  is allergic to prednisone and reclast [zoledronic acid].  MEDICATIONS:  Current Outpatient Prescriptions  Medication Sig Dispense Refill  . aspirin 81 MG tablet Take 81 mg by mouth daily.    [name] atorvastatin (LIPITOR) 80 MG tablet Take 80 mg by mouth daily at 6 PM.     . cholecalciferol (VITAMIN D) 1000 UNITS tablet Take 2,000 Units by mouth 2 (two) times daily.     . feeding supplement, ENSURE ENLIVE, (ENSURE ENLIVE) LIQD Take 237 mLs by mouth daily. 237 mL 12  . ferrous sulfate 325 (65 FE) MG EC tablet Take 325 mg by mouth daily with breakfast.    . fluticasone (FLONASE) 50 MCG/ACT nasal spray     . hydroxychloroquine (PLAQUENIL) 200 MG tablet Take 200 mg by mouth. One in the am and one half in pm    . levothyroxine (  SYNTHROID, LEVOTHROID) 75 MCG tablet     . losartan (COZAAR) 50 MG tablet 100 mg.     . magnesium chloride (SLOW-MAG) 64 MG TBEC SR tablet Take 1 tablet by mouth 2 (two) times daily.     . Nutritional Supplements (COLON FORMULA PO) Take 1 capsule by mouth daily.     [name] omeprazole (PRILOSEC) 20 MG capsule Take 20 mg by mouth daily.     No current facility-administered medications for this visit.     PHYSICAL EXAMINATION: ECOG PERFORMANCE STATUS: 2 - Symptomatic, <50% confined to bed  . Vitals:   07/19/16 1212  BP: (!) 178/77  Pulse: 88  Resp: 17  Temp: 98.4 F (36.9 C)    Filed Weights   07/19/16 1212  Weight: 135 lb (61.2 kg)   .Body mass index is 23.54 kg/m.  GENERAL: elderly lady in no acute distress and comfortable . SKIN: skin color, texture, turgor  are normal, no rashes or significant lesions EYES: normal, conjunctiva are pink and non-injected, sclera clear OROPHARYNX:no exudate, no erythema and lips, buccal mucosa, and tongue normal  NECK: supple, no JVD, thyroid normal size, non-tender, without nodularity LYMPH: no palpable lymphadenopathy in the cervical, axillary or inguinal LUNGS: clear to auscultation with normal respiratory effort HEART: regular rate & rhythm, no murmurs and no lower extremity edema ABDOMEN: abdomen soft, non-tender, normoactive bowel sounds  Musculoskeletal: no cyanosis of digits and no clubbing  PSYCH: alert & oriented x 3 with fluent speech NEURO: no focal motor/sensory deficits  LABORATORY DATA:   I have reviewed the data as listed  . CBC Latest Ref Rng & Units 07/19/2016 05/31/2016 01/12/2016  WBC 3.9 - 10.3 10e3/uL 10.7(H) 8.8 9.7  Hemoglobin 11.6 - 15.9 g/dL 10.5(L) 10.5(L) 11.0(L)  Hematocrit 34.8 - 46.6 % 31.5(L) 32.2(L) 33.6(L)  Platelets 145 - 400 10e3/uL 232 259 251   . CBC    Component Value Date/Time   WBC 10.7 (H) 07/19/2016 1148   WBC 8.8 05/31/2016 1313   RBC 3.48 (L) 07/19/2016 1148   RBC 3.56 (L) 05/31/2016 1313   HGB 10.5 (L) 07/19/2016 1148   HCT 31.5 (L) 07/19/2016 1148   PLT 232 07/19/2016 1148   MCV 90.5 07/19/2016 1148   MCH 30.2 07/19/2016 1148   MCH 29.5 05/31/2016 1313   MCHC 33.3 07/19/2016 1148   MCHC 32.6 05/31/2016 1313   RDW 14.1 07/19/2016 1148   LYMPHSABS 5.6 (H) 07/19/2016 1148   MONOABS 0.8 07/19/2016 1148   EOSABS 0.3 07/19/2016 1148   BASOSABS 0.0 07/19/2016 1148     . CMP Latest Ref Rng & Units 07/19/2016 05/31/2016 01/12/2016  Glucose 70 - 140 mg/dl 93 115(H) 93  BUN 7.0 - 26.0 mg/dL 21.6 23 19.3  Creatinine 0.6 - 1.1 mg/dL 0.8 1.03(H) 1.0  Sodium 136 - 145 mEq/L 139 141 138  Potassium 3.5 - 5.1 mEq/L 4.2 4.3 4.2  Chloride 98 - 110 mmol/L - 107 -  CO2 22 - 29 mEq/L 22 20 22   Calcium 8.4 - 10.4 mg/dL 9.5 8.9 9.3  Total Protein 6.4 - 8.3 g/dL  7.2 6.7 7.1  Total Bilirubin 0.20 - 1.20 mg/dL 0.56 0.3 0.50  Alkaline Phos 40 - 150 U/L 104 75 84  AST 5 - 34 U/L 27 23 23   ALT 0 - 55 U/L 19 14 13   and 30 seconds tramadol neck and   RADIOGRAPHIC STUDIES: I have personally reviewed the radiological images as listed and agreed with the findings in the report.  US Aorta  Result Date: 06/25/2016 CLINICAL DATA:  Follow-up abdominal aortic aneurysm EXAM: ULTRASOUND OF ABDOMINAL AORTA TECHNIQUE: Ultrasound examination of the abdominal aorta was performed to evaluate for abdominal aortic aneurysm. COMPARISON:  CT scan 03/25/2014 FINDINGS: Abdominal Aorta No aneurysm identified. Proximal aorta measures 1.6 cm in diameter. Mid aorta measures 2.3 cm in diameter. Distal aorta measures 2.1 cm in diameter. Maximum Diameter:  Mid aorta measures 2.3 cm in diameter. Atherosclerotic calcifications of aortic wall. IMPRESSION: No aortic aneurysm. Abdominal aorta measures 2.3 cm in maximum diameter. Atherosclerotic calcifications of aortic wall. Electronically Signed   By: Lahoma Crocker M.D.   On: 06/25/2016 10:30    ASSESSMENT & PLAN:   83 year old female with multiple medical comorbidities as noted above.  1) CLL. Rai stage 0. FISH panel shows 13q deletion -likely representing good prognostic indicator Patient has no clinically discernible lymphadenopathy, splenomegaly. Stable chronic mild anemia which remains stable but this seems to be less likely from CLL. Given her pelgeroid neutrophils and acanthocytes cannot rule out some element of age-related myelodysplasia. Also little has element of ACD due to her RA. Patient has no overt constitutional symptoms such as fevers/chills/drenching night sweats/weight loss more than 10% of body weight. WBC counts today are down to 10.7k. And lymphocyte counts is relatively stable at 5.6k. No thrombocytopenia Plan. -No indication for treatment of her CLL at this time. -Continue follow-up with primary care physician for  management of her other medical co-morbidities. -we shall recheck her blood counts in 6 months to ensure stability. -patient is aware of concerning symptoms that might suggest evidence of progression of CLL and was recommended to call us with these if present.  Return to care in 6 months with repeat CBC, CMP. Earlier follow-up if any new concerns or questions.  I spent 15 minutes counseling the patient face to face. The total time spent in the appointment was 20 minutes and more than 50% was on counseling and direct patient cares.    Sullivan Lone MD [place] AAHIVMS [facility] Hematology/Oncology Physician [facility]  (Office):       [phone] (Work cell):  [phone] (Fax):           [phone]

## 2016-08-19 ENCOUNTER — Other Ambulatory Visit: Payer: Self-pay | Admitting: Rheumatology

## 2016-08-19 NOTE — Telephone Encounter (Signed)
Last Visit: 05/31/16 Next Visit: 08/30/16 Labs: 05/31/16 Creat 1.03 GFR 49 PLQ Eye Exam: 04/12/16 WNL  Okay to refill PLQ?

## 2016-08-19 NOTE — Telephone Encounter (Signed)
ok 

## 2016-08-19 NOTE — Telephone Encounter (Signed)
Reduce Plaquenil to 200 mg by mouth daily due to low GFR

## 2016-08-19 NOTE — Telephone Encounter (Signed)
Patient advised to take reduce PLQ to one PO daily. Patient verbalized understanding.

## 2016-08-20 NOTE — Progress Notes (Signed)
Office Visit Note  Patient: Donna Burch             Date of Birth: February 14, 1927           MRN: [id number]             PCP: Vidal Schwalbe, MD Referring: Harlan Stains, MD Visit Date: 08/30/2016 Occupation: _0 @    Subjective:  Follow-up Rheumatoid arthritis, Plaquenil, OA of hands feet and knees and hip and knee pain  History of Present Illness: Donna Burch is a 83 y.o. female  Patient was last seen 05/31/2016. She's been doing about the same since the last visit. Her rheumatoid arthritis is well controlled with Plaquenil. Patient states that she was told by our office to decrease her Plaquenil dosage. She was on 200 mg in the morning and 100 mg in the evening but now is on 200 mg daily. Doing well with the current dose. She's been on a new dose for approximately 3-4 weeks.  Patient is also complaining of bilateral lower leg with burning sensation. It's been going on now since 4-5 months. It bothers her the most when she is sleeping at night. During the daytime it's not be concerned. I've asked the patient to discuss this with her PCP, Dr. Dema Severin as soon as possible. She states that she has a follow-up appointment in May 2018. I've advised her to try to see her sooner so she can start the medication accordingly and then at follow-up visit in May the may be able to adjust the medication so that she may see benefit from the medication treatment sooner than later. Patient is agreeable and she will do her best to be seen sooner  She also states that bilateral hands are hard to use. She states that she cannot make a grip on her own. She states that she has to use the opposite hand to flex the digits at the joints to make her hand work and grip. There is no pain associated with this at all.  In addition, patient is complaining about bilateral knee discomfort. She states "long time ago I had some injections with Dr. Berenice Primas and those injections helped me. I've encouraged  the patient to follow up again with Dr. Berenice Primas for further evaluation and treatment. We know that she has a history of severe osteoarthritis of bilateral knees. Patient states that she can feel her joints grinding in the knees when she is getting up.  Patient's Plaquenil eye exam was normal approximately January 2018. She goes to the eye doctor where her daughter works. They did do a Plaquenil eye exam and it was normal according to the patient.  Today, patient is accompanied by her grandson as always who takes her to her doctor's appointments. She states "he does not work on Fridays and I got them all to myself on Friday so that he can help me out."     Activities of Daily Living:  Patient reports morning stiffness for 60 minute.   Patient Reports nocturnal pain.  Difficulty dressing/grooming: Reports Difficulty climbing stairs: Reports Difficulty getting out of chair: Reports Difficulty using hands for taps, buttons, cutlery, and/or writing: Reports   Review of Systems  Constitutional: Negative for fatigue.  HENT: Negative for mouth sores and mouth dryness.   Eyes: Negative for dryness.  Respiratory: Negative for shortness of breath.   Gastrointestinal: Negative for constipation and diarrhea.  Musculoskeletal: Negative for myalgias and myalgias.  Skin: Negative for sensitivity to sunlight.  Psychiatric/Behavioral: Negative  for decreased concentration and sleep disturbance.    PMFS History:  Patient Active Problem List   Diagnosis Date Noted  . Inflammatory arthritis 05/29/2016  . Pain in joint involving multiple sites 05/29/2016  . Screening-pulmonary TB 05/29/2016  . High risk medication use 04/25/2016  . Bradycardia   . Symptomatic bradycardia   . Junctional escape rhythm   . Orthostatic hypotension   . Thyroid activity decreased   . Hypomagnesemia   . Syncope 09/01/2015  . Nausea & vomiting 09/01/2015  . Hyponatremia 09/01/2015  . Cough 09/01/2015  .  Hypertension   . Hyperlipidemia   . GERD (gastroesophageal reflux disease)   . Hypothyroidism   . Gastroesophageal reflux disease without esophagitis   . CLL (chronic lymphocytic leukemia) ([place]) 07/14/2015  . Thyroid nodule, right 01/03/2015  . Thyroid nodule 01/03/2015  . Pelvic relaxation 04/08/2012  . Kidney stones 04/08/2012  . Recurrent UTI (urinary tract infection) 04/08/2012    Past Medical History:  Diagnosis Date  . Arthritis    "right middle finger; hands" (01/03/2015)  . CLL (chronic lymphocytic leukemia) ([place])   . Dyspnea on exertion    Improved with exercise  . Elevated brain natriuretic peptide (BNP) level    Slight  . Family history of adverse reaction to anesthesia    son, Marita Snellen, w/PONV  . Frequent UTI    "used to; none lately" (01/03/2015)  . Gastroenteritis 09/2010   c. dificile.  hospitalized in ICU x 3 days  . GERD (gastroesophageal reflux disease)   . Hiatal hernia   . History of Clostridium difficile ?03/2012   "after taking ATB for one of my UTI's"  . Hyperlipidemia   . Hypertension   . Hypothyroidism   . Nephrolithiasis   . Nocturia    x3  . Pneumonia    Required hospital stay at the time  . PONV (postoperative nausea and vomiting)   . Renal insufficiency   . Seasonal allergies   . Skin cancer    right anterior neck  . Wears glasses     Family History  Problem Relation Age of Onset  . Heart attack Mother   . Hyperlipidemia Mother   . Asthma Father   . Diabetes Father    Past Surgical History:  Procedure Laterality Date  . CATARACT EXTRACTION, BILATERAL  2010  . COLONOSCOPY    . CYSTOSCOPY W/ STONE MANIPULATION Right   . CYSTOSCOPY WITH STENT PLACEMENT    . ESOPHAGOGASTRODUODENOSCOPY    . ESOPHAGOGASTRODUODENOSCOPY (EGD) WITH ESOPHAGEAL DILATION    . KNEE ARTHROSCOPY Left   . LITHOTRIPSY     "didn't work"  . SKIN CANCER EXCISION Right    anterior neck  . THYROID LOBECTOMY Right 01/03/2015  . THYROID LOBECTOMY Right 01/03/2015    Procedure: RIGHT THYROID LOBECTOMY;  Surgeon: Armandina Gemma, MD;  Location: [facility];  Service: General;  Laterality: Right;  . TONSILLECTOMY    . WISDOM TOOTH EXTRACTION     Social History   Social History Narrative  . No narrative on file     Objective: Vital Signs: BP (!) 160/72   Pulse 82   Resp 13   Ht 5' 3" (1.6 m)   Wt 134 lb (60.8 kg)   BMI 23.74 kg/m    Physical Exam  Constitutional: She is oriented to person, place, and time. She appears well-developed and well-nourished.  HENT:  Head: Normocephalic and atraumatic.  Eyes: EOM are normal. Pupils are equal, round, and reactive to light.  Cardiovascular: Normal  rate, regular rhythm and normal heart sounds.  Exam reveals no gallop and no friction rub.   No murmur heard. Pulmonary/Chest: Effort normal and breath sounds normal. She has no wheezes. She has no rales.  Abdominal: Soft. Bowel sounds are normal. She exhibits no distension. There is no tenderness. There is no guarding. No hernia.  Musculoskeletal: Normal range of motion. She exhibits no edema, tenderness or deformity.  Lymphadenopathy:    She has no cervical adenopathy.  Neurological: She is alert and oriented to person, place, and time. Coordination normal.  Skin: Skin is warm and dry. Capillary refill takes less than 2 seconds. No rash noted.  Psychiatric: She has a normal mood and affect. Her behavior is normal.  Nursing note and vitals reviewed.    Musculoskeletal Exam:  Decreased range of motion of bilateral shoulder joint secondary to osteoarthritis; decrease grip formation bilateral hands secondary OA; decrease flexion and extension of bilateral knees, bilateral hips. Secondary to OA Grip strength decreased bilaterally Fiber myalgia tender points are absent  CDAI Exam: CDAI Homunculus Exam:   Joint Counts:  CDAI Tender Joint count: 0 CDAI Swollen Joint count: 0  Global Assessments:  Patient Global Assessment: 3 Provider Global Assessment: 3  CDAI  Calculated Score: 6    Investigation: Findings:  02/02/2016 I obtained x-ray of bilateral hands 2 views today which showed all PIP and DIP narrowing.  She had soft tissue swelling in all of her PIP joints.  All MCP joints were narrowed especially the bilateral 2nd and 3rd MCPs.  She has intercarpal joint and radiocarpal joint space narrowing and questionable ulnar steroid erosion was noted in bilateral ulnar steroid area.  These findings were consistent with inflammatory arthritis most likely rheumatoid arthritis.    Bilateral feet x-rays showed bilateral PIP, DIP narrowing.  All MTPs subluxed and bilateral calcaneal spurs consistent with inflammatory arthritis most likely rheumatoid arthritis.   Bilateral knee joint x-rays 2 views showed right severe medial compartment narrowing and left lateral tibial compartment narrowing and patellofemoral narrowing consistent with severe osteoarthritis.  Bilateral elbow joint x-rays showed bilateral elbow space narrowing and no erosive changes could be consistent with inflammatory arthritis.   Bilateral shoulder joint x-rays were within normal limits except for left high rising humerus but no glenohumeral joint space narrowing was noted.  10/2015:  CBC showed hemoglobin of 11.4.  Comprehensive metabolic panel was normal.  Ferritin, B12, TSH, UA, cholesterol was normal.  Vitamin D was 38.7.  Sed rate was high was 65 in 02/2015 and her rheumatoid factor and ANA were negative in 07/2014.    Labs from February 02, 2016, show CMP with GFR is normal except for elevated creatinine at 0.97.  Decreased GFR at 57.  Albumin low at 35.  CBC with diff is normal.  CK is normal.  Hep panel is negative.  G6PD is normal.  HIV normal.  CCP normal and 14-3-3- eta is normal.  TB Gold is normal.  Sed rate elevated at 51.  Immunoglobulin G and A are decreased.  IgM is elevated at 1188.  Note, the patient has CLL and is seeing Dr. Hezzie Bump for that.  This is probably contributing to the  immunoglobulin abnormality as well as a sed rate abnormality.  Appointment on 07/19/2016  Component Date Value Ref Range Status  . WBC 07/19/2016 10.7* 3.9 - 10.3 10e3/uL Final  . NEUT# 07/19/2016 4.1  1.5 - 6.5 10e3/uL Final  . HGB 07/19/2016 10.5* 11.6 - 15.9 g/dL Final  . HCT  07/19/2016 31.5* 34.8 - 46.6 % Final  . Platelets 07/19/2016 232  145 - 400 10e3/uL Final  . MCV 07/19/2016 90.5  79.5 - 101.0 fL Final  . MCH 07/19/2016 30.2  25.1 - 34.0 pg Final  . MCHC 07/19/2016 33.3  31.5 - 36.0 g/dL Final  . RBC 07/19/2016 3.48* 3.70 - 5.45 10e6/uL Final  . RDW 07/19/2016 14.1  11.2 - 14.5 % Final  . lymph# 07/19/2016 5.6* 0.9 - 3.3 10e3/uL Final  . MONO# 07/19/2016 0.8  0.1 - 0.9 10e3/uL Final  . Eosinophils Absolute 07/19/2016 0.3  0.0 - 0.5 10e3/uL Final  . Basophils Absolute 07/19/2016 0.0  0.0 - 0.1 10e3/uL Final  . NEUT% 07/19/2016 38.1* 38.4 - 76.8 % Final  . LYMPH% 07/19/2016 52.3* 14.0 - 49.7 % Final  . MONO% 07/19/2016 7.0  0.0 - 14.0 % Final  . EOS% 07/19/2016 2.3  0.0 - 7.0 % Final  . BASO% 07/19/2016 0.3  0.0 - 2.0 % Final  . Retic % 07/19/2016 0.99  0.70 - 2.10 % Final  . Retic Ct Abs 07/19/2016 34.45  33.70 - 90.70 10e3/uL Final  . Immature Retic Fract 07/19/2016 3.30  1.60 - 10.00 % Final  . Sodium 07/19/2016 139  136 - 145 mEq/L Final  . Potassium 07/19/2016 4.2  3.5 - 5.1 mEq/L Final  . Chloride 07/19/2016 108  98 - 109 mEq/L Final  . CO2 07/19/2016 22  22 - 29 mEq/L Final  . Glucose 07/19/2016 93  70 - 140 mg/dl Final  . BUN 07/19/2016 21.6  7.0 - 26.0 mg/dL Final  . Creatinine 07/19/2016 0.8  0.6 - 1.1 mg/dL Final  . Total Bilirubin 07/19/2016 0.56  0.20 - 1.20 mg/dL Final  . Alkaline Phosphatase 07/19/2016 104  40 - 150 U/L Final  . AST 07/19/2016 27  5 - 34 U/L Final  . ALT 07/19/2016 19  0 - 55 U/L Final  . Total Protein 07/19/2016 7.2  6.4 - 8.3 g/dL Final  . Albumin 07/19/2016 3.3* 3.5 - 5.0 g/dL Final  . Calcium 07/19/2016 9.5  8.4 - 10.4 mg/dL Final  .  Anion Gap 07/19/2016 8  3 - 11 mEq/L Final  . EGFR 07/19/2016 62* >90 ml/min/1.73 m2 Final  . LDH 07/19/2016 191  125 - 245 U/L Final  Office Visit on 05/31/2016  Component Date Value Ref Range Status  . WBC 05/31/2016 8.8  3.8 - 10.8 K/uL Final  . RBC 05/31/2016 3.56* 3.80 - 5.10 MIL/uL Final  . Hemoglobin 05/31/2016 10.5* 11.7 - 15.5 g/dL Final  . HCT 05/31/2016 32.2* 35.0 - 45.0 % Final  . MCV 05/31/2016 90.4  80.0 - 100.0 fL Final  . MCH 05/31/2016 29.5  27.0 - 33.0 pg Final  . MCHC 05/31/2016 32.6  32.0 - 36.0 g/dL Final  . RDW 05/31/2016 14.0  11.0 - 15.0 % Final  . Platelets 05/31/2016 259  140 - 400 K/uL Final  . MPV 05/31/2016 8.7  7.5 - 12.5 fL Final  . Neutro Abs 05/31/2016 3432  1,500 - 7,800 cells/uL Final  . Lymphs Abs 05/31/2016 4488* 850 - 3,900 cells/uL Final  . Monocytes Absolute 05/31/2016 616  200 - 950 cells/uL Final  . Eosinophils Absolute 05/31/2016 264  15 - 500 cells/uL Final  . Basophils Absolute 05/31/2016 0  0 - 200 cells/uL Final  . Neutrophils Relative % 05/31/2016 39  % Final  . Lymphocytes Relative 05/31/2016 51  % Final  . Monocytes Relative 05/31/2016 7  %  Final  . Eosinophils Relative 05/31/2016 3  % Final  . Basophils Relative 05/31/2016 0  % Final  . Smear Review 05/31/2016 Criteria for review not met   Final  . Sodium 05/31/2016 141  135 - 146 mmol/L Final  . Potassium 05/31/2016 4.3  3.5 - 5.3 mmol/L Final  . Chloride 05/31/2016 107  98 - 110 mmol/L Final  . CO2 05/31/2016 20  20 - 31 mmol/L Final  . Glucose, Bld 05/31/2016 115* 65 - 99 mg/dL Final  . BUN 05/31/2016 23  7 - 25 mg/dL Final  . Creat 05/31/2016 1.03* 0.60 - 0.88 mg/dL Final   Comment:   For patients > or = 81 years of age: The upper reference limit for Creatinine is approximately 13% higher for people identified as African-American.     . Total Bilirubin 05/31/2016 0.3  0.2 - 1.2 mg/dL Final  . Alkaline Phosphatase 05/31/2016 75  33 - 130 U/L Final  . AST 05/31/2016 23  10  - 35 U/L Final  . ALT 05/31/2016 14  6 - 29 U/L Final  . Total Protein 05/31/2016 6.7  6.1 - 8.1 g/dL Final  . Albumin 05/31/2016 3.4* 3.6 - 5.1 g/dL Final  . Calcium 05/31/2016 8.9  8.6 - 10.4 mg/dL Final  . GFR, Est African American 05/31/2016 56* >=60 mL/min Final  . GFR, Est Non African American 05/31/2016 49* >=60 mL/min Final  . Vit D, 25-Hydroxy 05/31/2016 49  30 - 100 ng/mL Final   Comment: Vitamin D Status           25-OH Vitamin D        Deficiency                <20 ng/mL        Insufficiency         20 - 29 ng/mL        Optimal             > or = 30 ng/mL   For 25-OH Vitamin D testing on patients on D2-supplementation and patients for whom quantitation of D2 and D3 fractions is required, the [facility] 25-OH VIT D, (D2,D3), LC/MS/MS is recommended: order code [id number] (patients > 2 yrs).      Imaging: No results found.  Speciality Comments: No specialty comments available.    Procedures:  No procedures performed Allergies: Prednisone and Reclast [zoledronic acid]   Assessment / Plan:     Visit Diagnoses: Inflammatory arthritis  Pain in joint involving multiple sites  High risk medication use  History of gastroesophageal reflux (GERD)  Personal history of CLL (chronic lymphocytic leukemia)  History of hyperlipidemia   Plan: #1: Inflammatory arthritis. Doing well. Taking Plaquenil. New dose is 200 mg daily. Plaquenil eye exam is normal as of January 2018.  #2: History of CLL. Well-controlled at this time. Sees oncologist.  #3: Bilateral knee OA, bilateral hand OA. Significant discomfort to bilateral knees and bilateral hands but patient can "live with it".  #4: Patient will follow with Dr. Dema Severin, her PCP because of neuropathy type symptoms starting at her knees bilaterally and going down to her feet. Symptoms usually occur at night and it keeps her from getting a good night sleep. She may benefit from Neurontin/gabapentin but this will be determined  best by her PCP.  #5: Due to her hands not being able to grip well due to unable to flex her hands at the DIP PIP MCP, I've advised the patient to  consider using omega-3 fatty acid every day. Follow the directions on the bottle. Start off slow and increase the medication as tolerated to the dose recommended on the bottle.   #6: Return to clinic in 4 months for follow-up. At that visit I want to be sure that the new dose of Plaquenil a joint milligrams daily is adequately taking care for rheumatoid arthritis I also want to confirm that she has had medication given by her PCP for the neuropathy that she is complaining about. I also want to make sure that she is tolerating the omega-3 fatty acid well. In the meanwhile for her hand she can use over-the-counter Mobisyl or Aspercreme as tolerated..  Orders: No orders of the defined types were placed in this encounter.  No orders of the defined types were placed in this encounter.   Face-to-face time spent with patient was 30 minutes. 50% of time was spent in counseling and coordination of care.  Follow-Up Instructions: Return in about 4 months (around 12/30/2016).   Eliezer Lofts, PA-C  Note - This record has been created using [employer].  Chart creation errors have been sought, but may not always  have been located. Such creation errors do not reflect on  the standard of medical care.

## 2016-08-30 ENCOUNTER — Ambulatory Visit (INDEPENDENT_AMBULATORY_CARE_PROVIDER_SITE_OTHER): Payer: Medicare Other | Admitting: Rheumatology

## 2016-08-30 ENCOUNTER — Encounter: Payer: Self-pay | Admitting: Rheumatology

## 2016-08-30 VITALS — BP 160/72 | HR 82 | Resp 13 | Ht 63.0 in | Wt 134.0 lb

## 2016-08-30 DIAGNOSIS — Z8639 Personal history of other endocrine, nutritional and metabolic disease: Secondary | ICD-10-CM

## 2016-08-30 DIAGNOSIS — Z79899 Other long term (current) drug therapy: Secondary | ICD-10-CM

## 2016-08-30 DIAGNOSIS — Z8719 Personal history of other diseases of the digestive system: Secondary | ICD-10-CM

## 2016-08-30 DIAGNOSIS — Z856 Personal history of leukemia: Secondary | ICD-10-CM | POA: Diagnosis not present

## 2016-08-30 DIAGNOSIS — M2550 Pain in unspecified joint: Secondary | ICD-10-CM | POA: Diagnosis not present

## 2016-08-30 DIAGNOSIS — M1380 Other specified arthritis, unspecified site: Secondary | ICD-10-CM

## 2016-08-30 DIAGNOSIS — M1990 Unspecified osteoarthritis, unspecified site: Secondary | ICD-10-CM

## 2016-09-17 DIAGNOSIS — M1711 Unilateral primary osteoarthritis, right knee: Secondary | ICD-10-CM | POA: Diagnosis not present

## 2016-09-17 DIAGNOSIS — M1712 Unilateral primary osteoarthritis, left knee: Secondary | ICD-10-CM | POA: Diagnosis not present

## 2016-10-29 DIAGNOSIS — M1712 Unilateral primary osteoarthritis, left knee: Secondary | ICD-10-CM | POA: Diagnosis not present

## 2016-10-29 DIAGNOSIS — M1711 Unilateral primary osteoarthritis, right knee: Secondary | ICD-10-CM | POA: Diagnosis not present

## 2016-10-29 DIAGNOSIS — M25561 Pain in right knee: Secondary | ICD-10-CM | POA: Diagnosis not present

## 2016-10-29 DIAGNOSIS — M25562 Pain in left knee: Secondary | ICD-10-CM | POA: Diagnosis not present

## 2016-11-07 DIAGNOSIS — Z87898 Personal history of other specified conditions: Secondary | ICD-10-CM | POA: Diagnosis not present

## 2016-11-07 DIAGNOSIS — Z5181 Encounter for therapeutic drug level monitoring: Secondary | ICD-10-CM | POA: Diagnosis not present

## 2016-11-07 DIAGNOSIS — E039 Hypothyroidism, unspecified: Secondary | ICD-10-CM | POA: Diagnosis not present

## 2016-11-07 DIAGNOSIS — Z92241 Personal history of systemic steroid therapy: Secondary | ICD-10-CM | POA: Diagnosis not present

## 2016-11-07 DIAGNOSIS — R634 Abnormal weight loss: Secondary | ICD-10-CM | POA: Diagnosis not present

## 2016-11-07 DIAGNOSIS — Z8639 Personal history of other endocrine, nutritional and metabolic disease: Secondary | ICD-10-CM | POA: Diagnosis not present

## 2016-11-19 DIAGNOSIS — E785 Hyperlipidemia, unspecified: Secondary | ICD-10-CM | POA: Diagnosis not present

## 2016-11-19 DIAGNOSIS — N183 Chronic kidney disease, stage 3 (moderate): Secondary | ICD-10-CM | POA: Diagnosis not present

## 2016-11-19 DIAGNOSIS — I129 Hypertensive chronic kidney disease with stage 1 through stage 4 chronic kidney disease, or unspecified chronic kidney disease: Secondary | ICD-10-CM | POA: Diagnosis not present

## 2016-11-20 DIAGNOSIS — Z87898 Personal history of other specified conditions: Secondary | ICD-10-CM | POA: Diagnosis not present

## 2016-11-20 DIAGNOSIS — R634 Abnormal weight loss: Secondary | ICD-10-CM | POA: Diagnosis not present

## 2016-11-20 DIAGNOSIS — Z8639 Personal history of other endocrine, nutritional and metabolic disease: Secondary | ICD-10-CM | POA: Diagnosis not present

## 2016-11-20 DIAGNOSIS — E041 Nontoxic single thyroid nodule: Secondary | ICD-10-CM | POA: Diagnosis not present

## 2016-11-20 DIAGNOSIS — E039 Hypothyroidism, unspecified: Secondary | ICD-10-CM | POA: Diagnosis not present

## 2016-11-20 DIAGNOSIS — Z92241 Personal history of systemic steroid therapy: Secondary | ICD-10-CM | POA: Diagnosis not present

## 2016-11-27 ENCOUNTER — Telehealth: Payer: Self-pay | Admitting: Rheumatology

## 2016-11-27 MED ORDER — HYDROXYCHLOROQUINE SULFATE 200 MG PO TABS: 200.0000 mg | ORAL_TABLET | Freq: Every day | ORAL | 1 refills | Status: DC

## 2016-11-27 NOTE — Telephone Encounter (Signed)
Seth Bake, I have signed off on Plaquenil refill and I believe it has been sent. Please verify in case it did not go through correct.

## 2016-11-27 NOTE — Telephone Encounter (Signed)
Last Visit: 08/30/16 Next Visit: 01/07/17 Labs: 07/19/16 WBC 10.7, Hgb 10.5  PLQ eye exam: 04/02/16 WNL  Okay to refill PLQ?

## 2016-11-27 NOTE — Telephone Encounter (Signed)
Patient called this morning wanting [place] to call her refill for her PLQ to [facility] [phone]).  They will not fill it without Dr's authorization.  Patient's CB#[phone].  Thank you.

## 2016-12-30 ENCOUNTER — Encounter: Payer: Self-pay | Admitting: Rheumatology

## 2016-12-30 ENCOUNTER — Ambulatory Visit (INDEPENDENT_AMBULATORY_CARE_PROVIDER_SITE_OTHER): Payer: Medicare Other | Admitting: Rheumatology

## 2016-12-30 VITALS — BP 136/70 | HR 74 | Resp 14 | Ht 63.5 in | Wt 134.0 lb

## 2016-12-30 DIAGNOSIS — M1990 Unspecified osteoarthritis, unspecified site: Secondary | ICD-10-CM | POA: Diagnosis not present

## 2016-12-30 DIAGNOSIS — G629 Polyneuropathy, unspecified: Secondary | ICD-10-CM | POA: Diagnosis not present

## 2016-12-30 DIAGNOSIS — M25562 Pain in left knee: Secondary | ICD-10-CM | POA: Diagnosis not present

## 2016-12-30 DIAGNOSIS — M25561 Pain in right knee: Secondary | ICD-10-CM

## 2016-12-30 DIAGNOSIS — G8929 Other chronic pain: Secondary | ICD-10-CM | POA: Diagnosis not present

## 2016-12-30 DIAGNOSIS — Z79899 Other long term (current) drug therapy: Secondary | ICD-10-CM

## 2016-12-30 LAB — COMPLETE METABOLIC PANEL WITH GFR
ALK PHOS: 84 U/L (ref 33–130)
ALT: 15 U/L (ref 6–29)
AST: 23 U/L (ref 10–35)
Albumin: 3.4 g/dL — ABNORMAL LOW (ref 3.6–5.1)
BUN: 19 mg/dL (ref 7–25)
CHLORIDE: 106 mmol/L (ref 98–110)
CO2: 22 mmol/L (ref 20–31)
Calcium: 8.9 mg/dL (ref 8.6–10.4)
Creat: 0.96 mg/dL — ABNORMAL HIGH (ref 0.60–0.88)
GFR, EST NON AFRICAN AMERICAN: 53 mL/min — AB (ref >=60)
GFR, Est African American: 61 mL/min (ref >=60)
GLUCOSE: 77 mg/dL (ref 65–99)
Potassium: 4.4 mmol/L (ref 3.5–5.3)
SODIUM: 139 mmol/L (ref 135–146)
Total Bilirubin: 0.4 mg/dL (ref 0.2–1.2)
Total Protein: 6.7 g/dL (ref 6.1–8.1)

## 2016-12-30 LAB — CBC WITH DIFFERENTIAL/PLATELET
Basophils Absolute: 0 cells/uL (ref 0–200)
Basophils Relative: 0 %
EOS PCT: 2 %
Eosinophils Absolute: 174 cells/uL (ref 15–500)
HCT: 32.3 % — ABNORMAL LOW (ref 35.0–45.0)
Hemoglobin: 10.6 g/dL — ABNORMAL LOW (ref 11.7–15.5)
Lymphocytes Relative: 53 %
Lymphs Abs: 4611 cells/uL — ABNORMAL HIGH (ref 850–3900)
MCH: 30.7 pg (ref 27.0–33.0)
MCHC: 32.8 g/dL (ref 32.0–36.0)
MCV: 93.6 fL (ref 80.0–100.0)
MPV: 9 fL (ref 7.5–12.5)
Monocytes Absolute: 870 cells/uL (ref 200–950)
Monocytes Relative: 10 %
NEUTROS PCT: 35 %
Neutro Abs: 3045 cells/uL (ref 1500–7800)
PLATELETS: 248 10*3/uL (ref 140–400)
RBC: 3.45 MIL/uL — ABNORMAL LOW (ref 3.80–5.10)
RDW: 13.8 % (ref 11.0–15.0)
WBC: 8.7 10*3/uL (ref 3.8–10.8)

## 2016-12-30 MED ORDER — TRIAMCINOLONE ACETONIDE 40 MG/ML IJ SUSP
40.0000 mg | INTRAMUSCULAR | Status: AC | PRN
  Administered 2016-12-30: 40 mg via INTRA_ARTICULAR

## 2016-12-30 MED ORDER — LIDOCAINE HCL 2 % IJ SOLN
2.0000 mL | INTRAMUSCULAR | Status: AC | PRN
  Administered 2016-12-30: 2 mL

## 2016-12-30 NOTE — Progress Notes (Signed)
Office Visit Note  Patient: Donna Burch             Date of Birth: [birth year]           MRN: [id number]             PCP: Harlan Stains, MD Referring: Harlan Stains, MD Visit Date: 12/30/2016 Occupation: _0 @    Subjective:  Pain of the Right Knee   History of Present Illness: Donna Burch is a 83 y.o. female  Patient was last seen in our office on 08/30/2016 for rheumatoid arthritis. At the last visit, she stated her Plaquenil is controlling her rheumatoid arthritis well. She was doing 200 mg daily and it was giving her adequate response.   Patient has a history of neuropathy at the last visit and she stated it was going on for 4-5 months. She was given and discussed the symptoms with her PCP, Dr. Dema Severin at her appointment with him on May 2018.  In the past, patient has had knee injections from Dr. Berenice Primas for bilateral knee discomfort. She stated that she will follow up with him since her knees were starting to bother her at the last visit.  Her Plaquenil eye exam was normal January 2018.  Today, patient is accompanied by her daughter-in-law, Kloi Brodman, and patient reports that she's changing PCPs from Dr. Dema Severin to Dr. Rockwell Germany [facility] on [address]).) Because her neuropathy improved, she did not discuss the neuropathy symptoms with her PCP.   Patient is complaining that she is having severe right knee pain. She can't bear weight on it. She states that she can't walk because of the right knee pain. She rates her discomfort about 5-6 on a scale of 0-10) in her work she said medium and a half". Patient sees Dr. Berenice Primas, orthopedist, and received cortisone shots in left knee on 10/29/2016. Prior to that, she received cortisone injections in both knees on   Activities of Daily Living:  Patient reports morning stiffness for 30 minutes.   Patient Reports nocturnal pain.  Difficulty dressing/grooming: Reports Difficulty climbing stairs: Reports Difficulty  getting out of chair: Reports Difficulty using hands for taps, buttons, cutlery, and/or writing: Reports   Review of Systems  Constitutional: Negative for fatigue.  HENT: Negative for mouth sores and mouth dryness.   Eyes: Negative for dryness.  Respiratory: Negative for shortness of breath.   Gastrointestinal: Negative for constipation and diarrhea.  Musculoskeletal: Negative for myalgias and myalgias.  Skin: Negative for sensitivity to sunlight.  Psychiatric/Behavioral: Negative for decreased concentration and sleep disturbance.    PMFS History:  Patient Active Problem List   Diagnosis Date Noted  . Inflammatory arthritis 05/29/2016  . Pain in joint involving multiple sites 05/29/2016  . Screening-pulmonary TB 05/29/2016  . High risk medication use 04/25/2016  . Bradycardia   . Symptomatic bradycardia   . Junctional escape rhythm   . Orthostatic hypotension   . Thyroid activity decreased   . Hypomagnesemia   . Syncope 09/01/2015  . Nausea & vomiting 09/01/2015  . Hyponatremia 09/01/2015  . Cough 09/01/2015  . Hypertension   . Hyperlipidemia   . GERD (gastroesophageal reflux disease)   . Hypothyroidism   . Gastroesophageal reflux disease without esophagitis   . CLL (chronic lymphocytic leukemia) ([place]) 07/14/2015  . Thyroid nodule, right 01/03/2015  . Thyroid nodule 01/03/2015  . Pelvic relaxation 04/08/2012  . Kidney stones 04/08/2012  . Recurrent UTI (urinary tract infection) 04/08/2012    Past Medical History:  Diagnosis Date  . Arthritis    "right middle finger; hands" (01/03/2015)  . CLL (chronic lymphocytic leukemia) ([place])   . Dyspnea on exertion    Improved with exercise  . Elevated brain natriuretic peptide (BNP) level    Slight  . Family history of adverse reaction to anesthesia    son, Marita Snellen, w/PONV  . Frequent UTI    "used to; none lately" (01/03/2015)  . Gastroenteritis 09/2010   c. dificile.  hospitalized in ICU x 3 days  . GERD (gastroesophageal  reflux disease)   . Hiatal hernia   . History of Clostridium difficile ?03/2012   "after taking ATB for one of my UTI's"  . Hyperlipidemia   . Hypertension   . Hypothyroidism   . Nephrolithiasis   . Nocturia    x3  . Pneumonia    Required hospital stay at the time  . PONV (postoperative nausea and vomiting)   . Renal insufficiency   . Seasonal allergies   . Skin cancer    right anterior neck  . Wears glasses     Family History  Problem Relation Age of Onset  . Heart attack Mother   . Hyperlipidemia Mother   . Asthma Father   . Diabetes Father    Past Surgical History:  Procedure Laterality Date  . CATARACT EXTRACTION, BILATERAL  2010  . COLONOSCOPY    . CYSTOSCOPY W/ STONE MANIPULATION Right   . CYSTOSCOPY WITH STENT PLACEMENT    . ESOPHAGOGASTRODUODENOSCOPY    . ESOPHAGOGASTRODUODENOSCOPY (EGD) WITH ESOPHAGEAL DILATION    . KNEE ARTHROSCOPY Left   . LITHOTRIPSY     "didn't work"  . SKIN CANCER EXCISION Right    anterior neck  . THYROID LOBECTOMY Right 01/03/2015  . THYROID LOBECTOMY Right 01/03/2015   Procedure: RIGHT THYROID LOBECTOMY;  Surgeon: Armandina Gemma, MD;  Location: [facility];  Service: General;  Laterality: Right;  . TONSILLECTOMY    . WISDOM TOOTH EXTRACTION     Social History   Social History Narrative  . No narrative on file     Objective: Vital Signs: BP 136/70   Pulse 74   Resp 14   Ht 5' 3.5" (1.613 m)   Wt 134 lb (60.8 kg)   BMI 23.36 kg/m    Physical Exam  Constitutional: She is oriented to person, place, and time. She appears well-developed and well-nourished.  HENT:  Head: Normocephalic and atraumatic.  Eyes: EOM are normal. Pupils are equal, round, and reactive to light.  Cardiovascular: Normal rate, regular rhythm and normal heart sounds.  Exam reveals no gallop and no friction rub.   No murmur heard. Pulmonary/Chest: Effort normal and breath sounds normal. She has no wheezes. She has no rales.  Abdominal: Soft. Bowel sounds are  normal. She exhibits no distension. There is no tenderness. There is no guarding. No hernia.  Musculoskeletal: Normal range of motion. She exhibits no edema, tenderness or deformity.  Lymphadenopathy:    She has no cervical adenopathy.  Neurological: She is alert and oriented to person, place, and time. Coordination normal.  Skin: Skin is warm and dry. Capillary refill takes less than 2 seconds. No rash noted.  Psychiatric: She has a normal mood and affect. Her behavior is normal.  Nursing note and vitals reviewed.    Musculoskeletal Exam:    CDAI Exam: CDAI Homunculus Exam:   Joint Counts:  CDAI Tender Joint count: 0 CDAI Swollen Joint count: 0  Global Assessments:  Patient Global Assessment: 6 Provider  Global Assessment: 6  CDAI Calculated Score: 12    Investigation: No additional findings. Appointment on 07/19/2016  Component Date Value Ref Range Status  . WBC 07/19/2016 10.7* 3.9 - 10.3 10e3/uL Final  . NEUT# 07/19/2016 4.1  1.5 - 6.5 10e3/uL Final  . HGB 07/19/2016 10.5* 11.6 - 15.9 g/dL Final  . HCT 07/19/2016 31.5* 34.8 - 46.6 % Final  . Platelets 07/19/2016 232  145 - 400 10e3/uL Final  . MCV 07/19/2016 90.5  79.5 - 101.0 fL Final  . MCH 07/19/2016 30.2  25.1 - 34.0 pg Final  . MCHC 07/19/2016 33.3  31.5 - 36.0 g/dL Final  . RBC 07/19/2016 3.48* 3.70 - 5.45 10e6/uL Final  . RDW 07/19/2016 14.1  11.2 - 14.5 % Final  . lymph# 07/19/2016 5.6* 0.9 - 3.3 10e3/uL Final  . MONO# 07/19/2016 0.8  0.1 - 0.9 10e3/uL Final  . Eosinophils Absolute 07/19/2016 0.3  0.0 - 0.5 10e3/uL Final  . Basophils Absolute 07/19/2016 0.0  0.0 - 0.1 10e3/uL Final  . NEUT% 07/19/2016 38.1* 38.4 - 76.8 % Final  . LYMPH% 07/19/2016 52.3* 14.0 - 49.7 % Final  . MONO% 07/19/2016 7.0  0.0 - 14.0 % Final  . EOS% 07/19/2016 2.3  0.0 - 7.0 % Final  . BASO% 07/19/2016 0.3  0.0 - 2.0 % Final  . Retic % 07/19/2016 0.99  0.70 - 2.10 % Final  . Retic Ct Abs 07/19/2016 34.45  33.70 - 90.70 10e3/uL  Final  . Immature Retic Fract 07/19/2016 3.30  1.60 - 10.00 % Final  . Sodium 07/19/2016 139  136 - 145 mEq/L Final  . Potassium 07/19/2016 4.2  3.5 - 5.1 mEq/L Final  . Chloride 07/19/2016 108  98 - 109 mEq/L Final  . CO2 07/19/2016 22  22 - 29 mEq/L Final  . Glucose 07/19/2016 93  70 - 140 mg/dl Final   Glucose reference range is for nonfasting patients. Fasting glucose reference range is 70- 100.  [name] BUN 07/19/2016 21.6  7.0 - 26.0 mg/dL Final  . Creatinine 07/19/2016 0.8  0.6 - 1.1 mg/dL Final  . Total Bilirubin 07/19/2016 0.56  0.20 - 1.20 mg/dL Final  . Alkaline Phosphatase 07/19/2016 104  40 - 150 U/L Final  . AST 07/19/2016 27  5 - 34 U/L Final  . ALT 07/19/2016 19  0 - 55 U/L Final  . Total Protein 07/19/2016 7.2  6.4 - 8.3 g/dL Final  . Albumin 07/19/2016 3.3* 3.5 - 5.0 g/dL Final  . Calcium 07/19/2016 9.5  8.4 - 10.4 mg/dL Final  . Anion Gap 07/19/2016 8  3 - 11 mEq/L Final  . EGFR 07/19/2016 62* >90 ml/min/1.73 m2 Final   eGFR is calculated using the CKD-EPI Creatinine Equation (2009)  . LDH 07/19/2016 191  125 - 245 U/L Final     Imaging: No results found.  Speciality Comments: No specialty comments available.    Procedures:  Large Joint Inj Date/Time: 12/30/2016 2:02 PM Performed by: Eliezer Lofts Authorized by: Eliezer Lofts   Consent Given by:  Patient Site marked: the procedure site was marked   Timeout: prior to procedure the correct patient, procedure, and site was verified   Indications:  Pain and joint swelling Location:  Knee Site:  R knee Prep: patient was prepped and draped in usual sterile fashion   Needle Size:  27 G Needle Length:  1.5 inches Approach:  Medial Ultrasound Guidance: No   Fluoroscopic Guidance: No   Arthrogram: No  Medications:  40 mg triamcinolone acetonide 40 MG/ML; 2 mL lidocaine 2 % Aspiration Attempted: Yes   Aspirate amount (mL):  0 Patient tolerance:  Patient tolerated the procedure well with no immediate  complications   Note: Patient had a right knee injection from Dr. Berenice Primas office approximately February/ March 2018.  She is having severe pain to the right knee rated 10 on a scale of 0-10. She is requesting a cortisone injection again today. Since the last injection to her right knee was about 4-5 months ago, it is appropriate and we will give her cortisone. I've instructed the patient to tell Dr. Berenice Primas that she did get a cortisone injection from our office today at her appointment that she has with him on August 2018. I specifically told the patient that she cannot get another cortisone injection for at least 4-5 months.  She also may benefit from Visco supplementation and she should discuss that with Dr. Berenice Primas.    Allergies: Prednisone and Reclast [zoledronic acid]   Assessment / Plan:     Visit Diagnoses: Inflammatory arthritis  High risk medication use - Plan: CBC with Differential/Platelet, COMPLETE METABOLIC PANEL WITH GFR  Bilateral chronic knee pain  Neuropathy  Acute pain of right knee   Plan: #1: Inflammatory arthritis Doing well. No flare. 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 In addition, patient has a history of OA of bilateral hands with DIP PIP prominence which causes her significant amount of discomfort.  #2: High-risk prescription Taking Plaquenil 200 mg once a day Plaquenil eye exam normal January 2018  #3: History of bilateral knee joint pain. Patient is seen Dr. Berenice Primas for this before and received injections. She will follow with him for this again. She will told Dr. Berenice Primas that she received a cortisone injection to the right knee in our office today.  #4: Neuropathy. Patient will see PCP for her neuropathy symptoms. She has a new PCP, Dr. Rockwell Germany  #5: Return to clinic in 5 months   Orders: Orders Placed This Encounter  Procedures  . Large Joint Injection/Arthrocentesis  . CBC  with Differential/Platelet  . COMPLETE METABOLIC PANEL WITH GFR   No orders of the defined types were placed in this encounter.   Face-to-face time spent with patient was 30 minutes. 50% of time was spent in counseling and coordination of care.  Follow-Up Instructions: Return in about 5 months (around 06/01/2017) for RA,oakj,rt knee pain, lt knee pain,oahands,.   Eliezer Lofts, PA-C I examined and evaluated the patient with Eliezer Lofts PA. Patient was having significant pain and discomfort in her right knee joint today. After different treatment options were discussed the right knee joint was injected with cortisone. I've advised her to discuss future Visco supplement injections with Dr. Berenice Primas. The plan of care was discussed as noted above.  Bo Merino, MD Note - This record has been created using Editor, commissioning.  Chart creation errors have been sought, but may not always  have been located. Such creation errors do not reflect on  the standard of medical care.

## 2017-01-03 ENCOUNTER — Ambulatory Visit: Payer: Medicare Other | Admitting: Rheumatology

## 2017-01-07 ENCOUNTER — Ambulatory Visit: Payer: Medicare Other | Admitting: Rheumatology

## 2017-01-17 ENCOUNTER — Ambulatory Visit: Payer: Self-pay | Admitting: Rheumatology

## 2017-01-17 ENCOUNTER — Ambulatory Visit (HOSPITAL_BASED_OUTPATIENT_CLINIC_OR_DEPARTMENT_OTHER): Payer: Medicare Other | Admitting: Hematology

## 2017-01-17 ENCOUNTER — Telehealth: Payer: Self-pay | Admitting: Hematology

## 2017-01-17 ENCOUNTER — Other Ambulatory Visit (HOSPITAL_BASED_OUTPATIENT_CLINIC_OR_DEPARTMENT_OTHER): Payer: Medicare Other

## 2017-01-17 VITALS — BP 149/58 | HR 93 | Temp 97.9°F | Resp 18 | Ht 63.5 in | Wt 134.6 lb

## 2017-01-17 DIAGNOSIS — C9110 Chronic lymphocytic leukemia of B-cell type not having achieved remission: Secondary | ICD-10-CM

## 2017-01-17 DIAGNOSIS — D649 Anemia, unspecified: Secondary | ICD-10-CM | POA: Diagnosis not present

## 2017-01-17 DIAGNOSIS — M069 Rheumatoid arthritis, unspecified: Secondary | ICD-10-CM | POA: Diagnosis not present

## 2017-01-17 LAB — CBC & DIFF AND RETIC
BASO%: 0.3 % (ref 0.0–2.0)
Basophils Absolute: 0 10*3/uL (ref 0.0–0.1)
EOS ABS: 0.1 10*3/uL (ref 0.0–0.5)
EOS%: 0.9 % (ref 0.0–7.0)
HEMATOCRIT: 34.4 % — AB (ref 34.8–46.6)
HGB: 11.4 g/dL — ABNORMAL LOW (ref 11.6–15.9)
IMMATURE RETIC FRACT: 2.6 % (ref 1.60–10.00)
LYMPH#: 7.6 10*3/uL — AB (ref 0.9–3.3)
LYMPH%: 55.5 % — ABNORMAL HIGH (ref 14.0–49.7)
MCH: 31.2 pg (ref 25.1–34.0)
MCHC: 33.1 g/dL (ref 31.5–36.0)
MCV: 94.2 fL (ref 79.5–101.0)
MONO#: 0.7 10*3/uL (ref 0.1–0.9)
MONO%: 5.1 % (ref 0.0–14.0)
NEUT#: 5.3 10*3/uL (ref 1.5–6.5)
NEUT%: 38.2 % — ABNORMAL LOW (ref 38.4–76.8)
PLATELETS: 216 10*3/uL (ref 145–400)
RBC: 3.65 10*6/uL — ABNORMAL LOW (ref 3.70–5.45)
RDW: 13.6 % (ref 11.2–14.5)
RETIC %: 0.83 % (ref 0.70–2.10)
Retic Ct Abs: 30.3 10*3/uL — ABNORMAL LOW (ref 33.70–90.70)
WBC: 13.7 10*3/uL — AB (ref 3.9–10.3)

## 2017-01-17 LAB — COMPREHENSIVE METABOLIC PANEL
ALT: 21 U/L (ref 0–55)
ANION GAP: 8 meq/L (ref 3–11)
AST: 27 U/L (ref 5–34)
Albumin: 3.4 g/dL — ABNORMAL LOW (ref 3.5–5.0)
Alkaline Phosphatase: 90 U/L (ref 40–150)
BUN: 22.8 mg/dL (ref 7.0–26.0)
CALCIUM: 10 mg/dL (ref 8.4–10.4)
CO2: 22 meq/L (ref 22–29)
Chloride: 109 mEq/L (ref 98–109)
Creatinine: 1 mg/dL (ref 0.6–1.1)
EGFR: 52 mL/min/{1.73_m2} — ABNORMAL LOW (ref >90)
Glucose: 95 mg/dl (ref 70–140)
Potassium: 4.5 mEq/L (ref 3.5–5.1)
Sodium: 139 mEq/L (ref 136–145)
TOTAL PROTEIN: 7.5 g/dL (ref 6.4–8.3)
Total Bilirubin: 0.53 mg/dL (ref 0.20–1.20)

## 2017-01-17 NOTE — Progress Notes (Signed)
[name]  HEMATOLOGY ONCOLOGY PROGRESS NOTE  Date of service: .01/17/2017  Patient Care Team: Harlan Stains, MD as PCP - General (Family Medicine)  Diagnosis: CLL Rai Stage 0  Current Treatment: Observation  INTERVAL HISTORY: Donna Burch is here for her scheduled follow-up along with her family member. Notes no fevers, chills, night sweats or unexpected weight loss. Her blood counts are fairly stable with no evidence of symptomatic progression of her CLL. Notes issues with joint pains related to her rheumatoid arthritis and osteoarthritis and follows with Dr.Deveshwar for this.   REVIEW OF SYSTEMS:    10 Point review of systems of done and is negative except as noted above.  . Past Medical History:  Diagnosis Date  . Arthritis    "right middle finger; hands" (01/03/2015)  . CLL (chronic lymphocytic leukemia) ([place])   . Dyspnea on exertion    Improved with exercise  . Elevated brain natriuretic peptide (BNP) level    Slight  . Family history of adverse reaction to anesthesia    son, Marita Snellen, w/PONV  . Frequent UTI    "used to; none lately" (01/03/2015)  . Gastroenteritis 09/2010   c. dificile.  hospitalized in ICU x 3 days  . GERD (gastroesophageal reflux disease)   . Hiatal hernia   . History of Clostridium difficile ?03/2012   "after taking ATB for one of my UTI's"  . Hyperlipidemia   . Hypertension   . Hypothyroidism   . Nephrolithiasis   . Nocturia    x3  . Pneumonia    Required hospital stay at the time  . PONV (postoperative nausea and vomiting)   . Renal insufficiency   . Seasonal allergies   . Skin cancer    right anterior neck  . Wears glasses     . Past Surgical History:  Procedure Laterality Date  . CATARACT EXTRACTION, BILATERAL  2010  . COLONOSCOPY    . CYSTOSCOPY W/ STONE MANIPULATION Right   . CYSTOSCOPY WITH STENT PLACEMENT    . ESOPHAGOGASTRODUODENOSCOPY    . ESOPHAGOGASTRODUODENOSCOPY (EGD) WITH ESOPHAGEAL DILATION    . KNEE ARTHROSCOPY Left   .  LITHOTRIPSY     "didn't work"  . SKIN CANCER EXCISION Right    anterior neck  . THYROID LOBECTOMY Right 01/03/2015  . THYROID LOBECTOMY Right 01/03/2015   Procedure: RIGHT THYROID LOBECTOMY;  Surgeon: Armandina Gemma, MD;  Location: [facility];  Service: General;  Laterality: Right;  . TONSILLECTOMY    . WISDOM TOOTH EXTRACTION      . Social History  Substance Use Topics  . Smoking status: Never Smoker  . Smokeless tobacco: Never Used  . Alcohol use No    ALLERGIES:  is allergic to prednisone and reclast [zoledronic acid].  MEDICATIONS:  Current Outpatient Prescriptions  Medication Sig Dispense Refill  . aspirin 81 MG tablet Take 81 mg by mouth daily.    [name] atorvastatin (LIPITOR) 80 MG tablet Take 80 mg by mouth daily at 6 PM.     . cholecalciferol (VITAMIN D) 1000 UNITS tablet Take 2,000 Units by mouth 2 (two) times daily.     . feeding supplement, ENSURE ENLIVE, (ENSURE ENLIVE) LIQD Take 237 mLs by mouth daily. 237 mL 12  . ferrous sulfate 325 (65 FE) MG EC tablet Take 325 mg by mouth daily with breakfast.    . fluticasone (FLONASE) 50 MCG/ACT nasal spray     . hydroxychloroquine (PLAQUENIL) 200 MG tablet Take 1 tablet (200 mg total) by mouth daily. 90 tablet  1  . levothyroxine (SYNTHROID, LEVOTHROID) 75 MCG tablet     . losartan (COZAAR) 100 MG tablet     . magnesium chloride (SLOW-MAG) 64 MG TBEC SR tablet Take 1 tablet by mouth 2 (two) times daily.     . Nutritional Supplements (COLON FORMULA PO) Take 1 capsule by mouth daily.     [name] omeprazole (PRILOSEC) 20 MG capsule Take 20 mg by mouth daily.     No current facility-administered medications for this visit.     PHYSICAL EXAMINATION: ECOG PERFORMANCE STATUS: 2 - Symptomatic, <50% confined to bed  . Vitals:   01/17/17 1136  BP: (!) 149/58  Pulse: 93  Resp: 18  Temp: 97.9 F (36.6 C)    Filed Weights   01/17/17 1136  Weight: 134 lb 9.6 oz (61.1 kg)   .Body mass index is 23.47 kg/m.  GENERAL: elderly lady in no acute  distress and comfortable . SKIN: skin color, texture, turgor are normal, no rashes or significant lesions EYES: normal, conjunctiva are pink and non-injected, sclera clear OROPHARYNX:no exudate, no erythema and lips, buccal mucosa, and tongue normal  NECK: supple, no JVD, thyroid normal size, non-tender, without nodularity LYMPH: no palpable lymphadenopathy in the cervical, axillary or inguinal LUNGS: clear to auscultation with normal respiratory effort HEART: regular rate & rhythm, no murmurs and no lower extremity edema ABDOMEN: abdomen soft, non-tender, normoactive bowel sounds  Musculoskeletal: no cyanosis of digits and no clubbing  PSYCH: alert & oriented x 3 with fluent speech NEURO: no focal motor/sensory deficits  LABORATORY DATA:   I have reviewed the data as listed  . CBC Latest Ref Rng & Units 01/17/2017 12/30/2016 07/19/2016  WBC 3.9 - 10.3 10e3/uL 13.7(H) 8.7 10.7(H)  Hemoglobin 11.6 - 15.9 g/dL 11.4(L) 10.6(L) 10.5(L)  Hematocrit 34.8 - 46.6 % 34.4(L) 32.3(L) 31.5(L)  Platelets 145 - 400 10e3/uL 216 248 232   . CBC    Component Value Date/Time   WBC 13.7 (H) 01/17/2017 1122   WBC 8.7 12/30/2016 1426   RBC 3.65 (L) 01/17/2017 1122   RBC 3.45 (L) 12/30/2016 1426   HGB 11.4 (L) 01/17/2017 1122   HCT 34.4 (L) 01/17/2017 1122   PLT 216 01/17/2017 1122   MCV 94.2 01/17/2017 1122   MCH 31.2 01/17/2017 1122   MCH 30.7 12/30/2016 1426   MCHC 33.1 01/17/2017 1122   MCHC 32.8 12/30/2016 1426   RDW 13.6 01/17/2017 1122   LYMPHSABS 7.6 (H) 01/17/2017 1122   MONOABS 0.7 01/17/2017 1122   EOSABS 0.1 01/17/2017 1122   BASOSABS 0.0 01/17/2017 1122     . CMP Latest Ref Rng & Units 01/17/2017 12/30/2016 07/19/2016  Glucose 70 - 140 mg/dl 95 77 93  BUN 7.0 - 26.0 mg/dL 22.8 19 21.6  Creatinine 0.6 - 1.1 mg/dL 1.0 0.96(H) 0.8  Sodium 136 - 145 mEq/L 139 139 139  Potassium 3.5 - 5.1 mEq/L 4.5 4.4 4.2  Chloride 98 - 110 mmol/L - 106 -  CO2 22 - 29 mEq/L 22 22 22   Calcium  8.4 - 10.4 mg/dL 10.0 8.9 9.5  Total Protein 6.4 - 8.3 g/dL 7.5 6.7 7.2  Total Bilirubin 0.20 - 1.20 mg/dL 0.53 0.4 0.56  Alkaline Phos 40 - 150 U/L 90 84 104  AST 5 - 34 U/L 27 23 27   ALT 0 - 55 U/L 21 15 19   and 30 seconds tramadol neck and   RADIOGRAPHIC STUDIES: I have personally reviewed the radiological images as listed and agreed with the  findings in the report. No results found.  ASSESSMENT & PLAN:   83 year old female with multiple medical comorbidities as noted above.  1) CLL. Rai stage 0. FISH panel shows 13q deletion -likely representing good prognostic indicator Patient has no clinically discernible lymphadenopathy, splenomegaly. Stable chronic mild anemia which remains stable but this seems to be less likely from CLL. Given her pelgeroid neutrophils and acanthocytes cannot rule out some element of age-related myelodysplasia. Also little has element of ACD due to her RA. Patient has no overt constitutional symptoms such as fevers/chills/drenching night sweats/weight loss more than 10% of body weight. WBC counts today slightly up to 13.7k . And lymphocyte counts is minimally higher from  5.6k to 7.6k No thrombocytopenia Hgb is improved from 10. To 11.4 Plan. -No indication for treatment of her CLL at this time. -Continue follow-up with primary care physician for management of her other medical co-morbidities. -we shall recheck her blood counts in 6 months to ensure stability. -patient is aware of concerning symptoms that might suggest evidence of progression of CLL and was recommended to call us with these if present. -Continue follow with rheumatology to address her joint pain issues and rheumatoid arthritis.  Return to care in 6 months with repeat CBC, CMP. Earlier follow-up if any new concerns or questions.  I spent 15 minutes counseling the patient face to face. The total time spent in the appointment was 20 minutes and more than 50% was on counseling and direct  patient cares.    Sullivan Lone MD [place] AAHIVMS [facility] Hematology/Oncology Physician [facility]  (Office):       [phone] (Work cell):  [phone] (Fax):           [phone]

## 2017-01-17 NOTE — Telephone Encounter (Signed)
Gave patient avs report and appointments for January  °

## 2017-01-30 ENCOUNTER — Other Ambulatory Visit: Payer: Self-pay | Admitting: Physician Assistant

## 2017-01-30 ENCOUNTER — Ambulatory Visit
Admission: RE | Admit: 2017-01-30 | Discharge: 2017-01-30 | Disposition: A | Discharge status: Home / Self Care | Payer: Medicare Other | Source: Ambulatory Visit | Attending: Physician Assistant | Admitting: Physician Assistant

## 2017-01-30 DIAGNOSIS — R0781 Pleurodynia: Secondary | ICD-10-CM

## 2017-02-04 ENCOUNTER — Telehealth: Payer: Self-pay | Admitting: Rheumatology

## 2017-02-04 NOTE — Telephone Encounter (Signed)
She needs to see her PCP

## 2017-02-04 NOTE — Telephone Encounter (Signed)
Patient has been having rib pain x2 days. Patient wants in be worked into the schedule tomorrow. Please call to advise.

## 2017-02-04 NOTE — Telephone Encounter (Signed)
Called patient to advise she should call her primary care, ASAP. She states she did see a PA at her PCP and was told they did not know what was going on with her, she wants to come in here to discuss with Dr Estanislado Pandy, she thinks it may be from her arthritis. Her hands are also painful ( she is on PLQ for inflammatory arthritis)    Do we have anywhere to work her in ??

## 2017-02-05 ENCOUNTER — Telehealth: Payer: Self-pay | Admitting: Rheumatology

## 2017-02-05 DIAGNOSIS — B029 Zoster without complications: Secondary | ICD-10-CM | POA: Diagnosis not present

## 2017-02-05 NOTE — Telephone Encounter (Signed)
FYI Patient went to GP, and found out she has Shingles. She wanted to apologize to Dr. Estanislado Pandy for bugging her.

## 2017-02-05 NOTE — Telephone Encounter (Signed)
Thank you I have called her to advise. 

## 2017-02-06 NOTE — Telephone Encounter (Signed)
Thank you I called her, she was not bugging [place], we just were concerned she had something we could not take care of (like shingles)

## 2017-02-07 DIAGNOSIS — B029 Zoster without complications: Secondary | ICD-10-CM | POA: Diagnosis not present

## 2017-02-12 DIAGNOSIS — T887XXA Unspecified adverse effect of drug or medicament, initial encounter: Secondary | ICD-10-CM | POA: Diagnosis not present

## 2017-02-12 DIAGNOSIS — K5903 Drug induced constipation: Secondary | ICD-10-CM | POA: Diagnosis not present

## 2017-02-12 DIAGNOSIS — B029 Zoster without complications: Secondary | ICD-10-CM | POA: Diagnosis not present

## 2017-02-12 DIAGNOSIS — R112 Nausea with vomiting, unspecified: Secondary | ICD-10-CM | POA: Diagnosis not present

## 2017-02-17 ENCOUNTER — Other Ambulatory Visit: Payer: Self-pay | Admitting: Rheumatology

## 2017-02-17 NOTE — Telephone Encounter (Signed)
Last Visit: 12/30/16 Next Visit: 06/02/17 Labs: 12/30/16 Creat 0.96  Previously normal GFR 53 previously 49  PLQ eye exam on 04/12/16 WNL  Okay to refill PLQ?

## 2017-02-17 NOTE — Telephone Encounter (Signed)
ok 

## 2017-02-20 DIAGNOSIS — R112 Nausea with vomiting, unspecified: Secondary | ICD-10-CM | POA: Diagnosis not present

## 2017-03-04 IMAGING — CT CT HEAD W/O CM
2 series · 15 of 30 positions shown, 19 images · non-contrast
Comparison: No priors.

CLINICAL DATA: 88-year-old female with several syncopal episodes
for the past 5 days, currently being treated for urinary tract
infection.

EXAM:
CT HEAD WITHOUT CONTRAST
TECHNIQUE: Contiguous axial images were obtained from the base of the skull
through the vertex without intravenous contrast.

[Series 201: head w/o, idose (1) · axial · non-contrast · 0.49mm/px · z∈[+249,+369]mm · 13 of 30 slices shown, 17 images]
[im 3/30  brain]
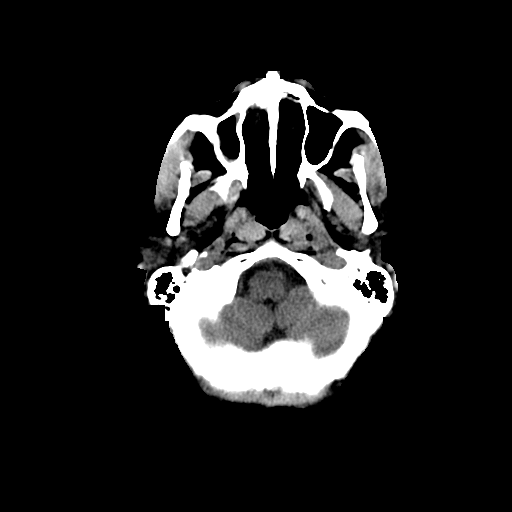
[im 3/30  bone]
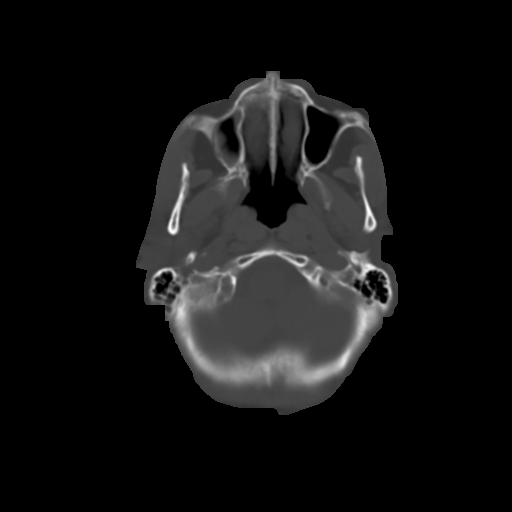
[im 5/30  brain]
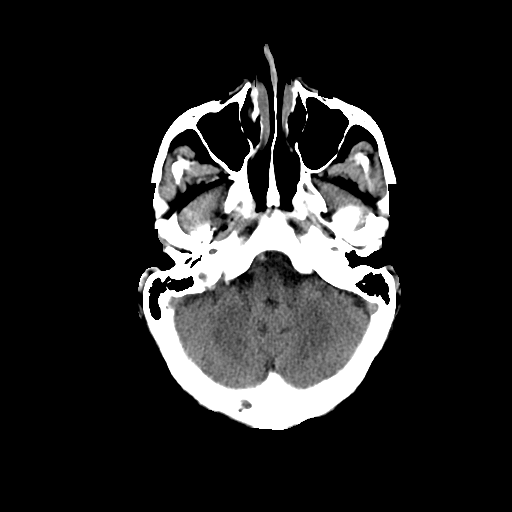
[im 7/30  brain]
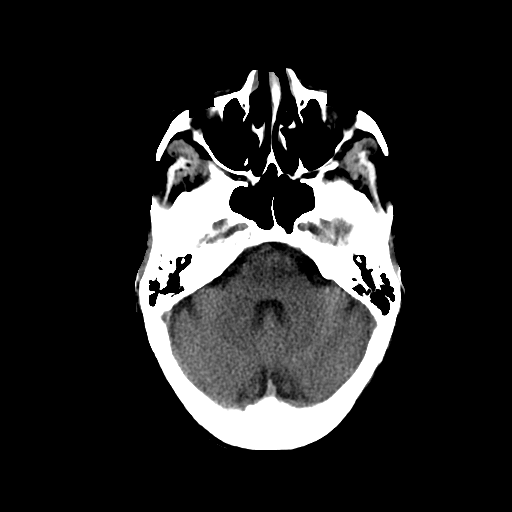
[im 9/30  brain]
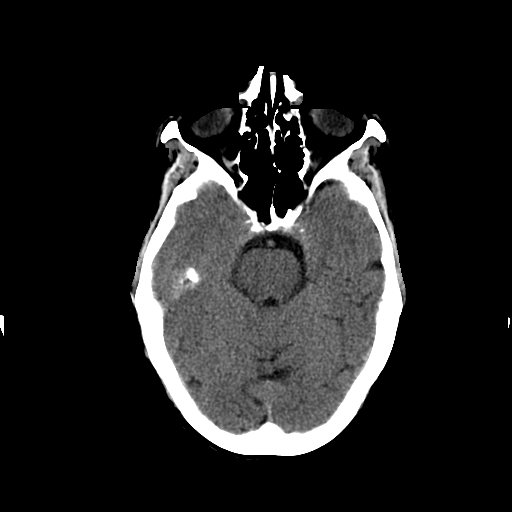
[im 11/30  brain]
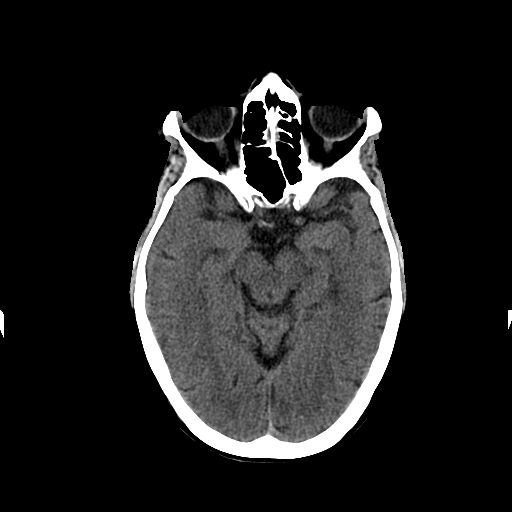
[im 11/30  bone]
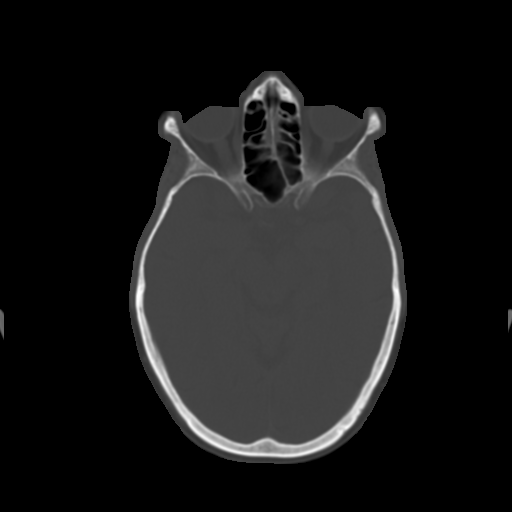
[im 13/30  brain]
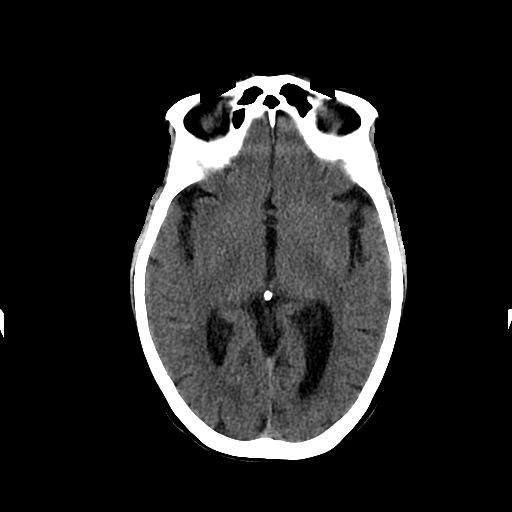
[im 15/30  brain]
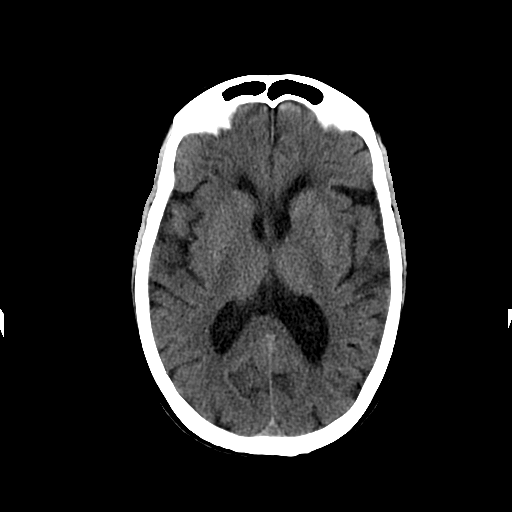
[im 17/30  brain]
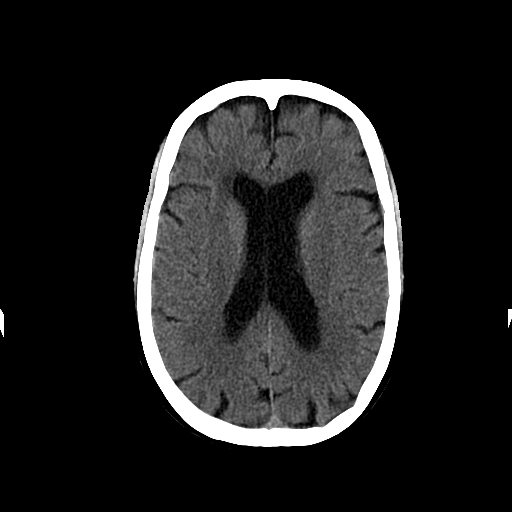
[im 19/30  brain]
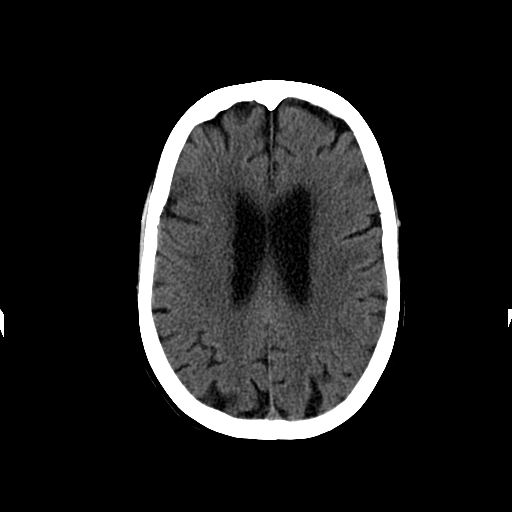
[im 19/30  bone]
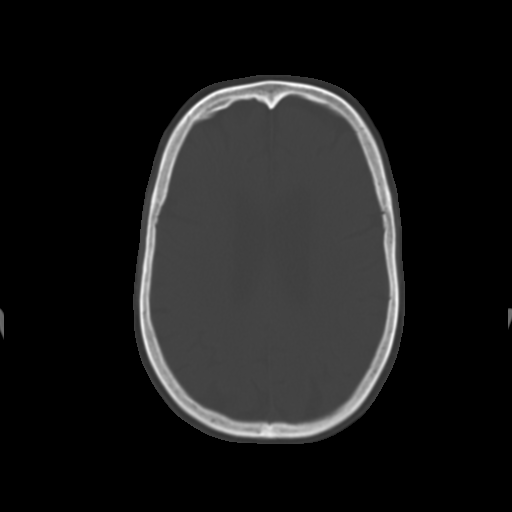
[im 21/30  brain]
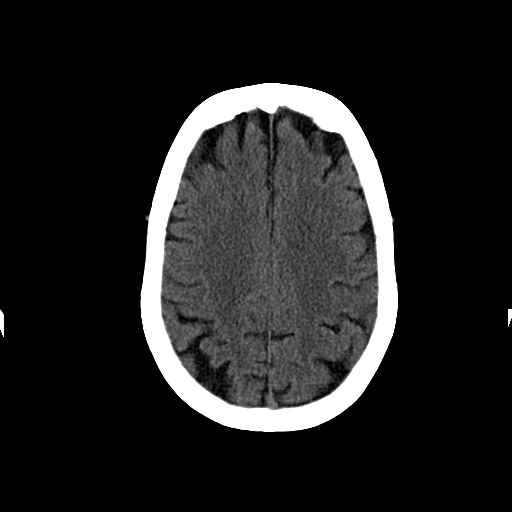
[im 23/30  brain]
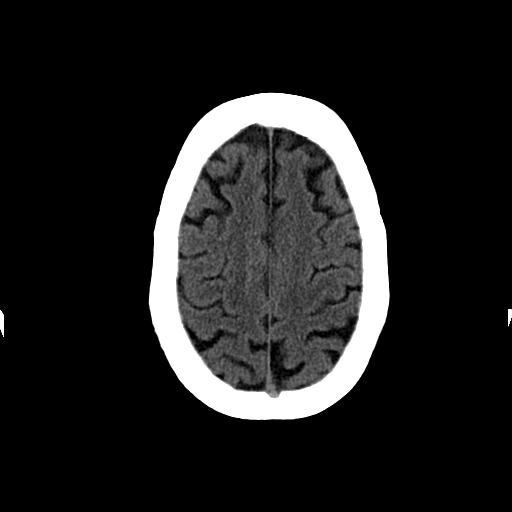
[im 25/30  brain]
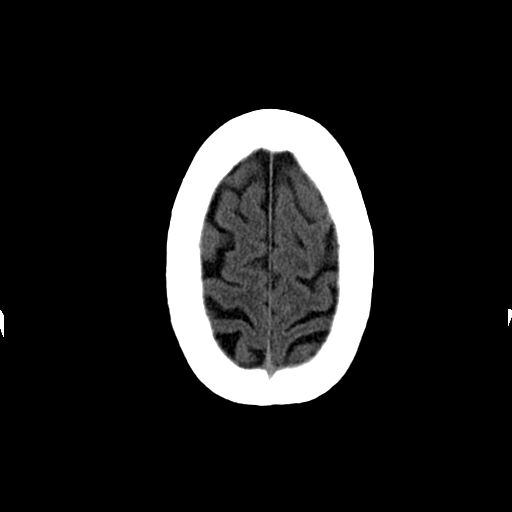
[im 27/30  brain]
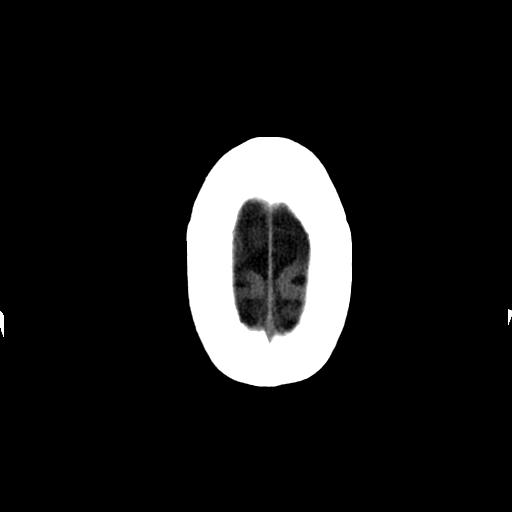
[im 27/30  bone]
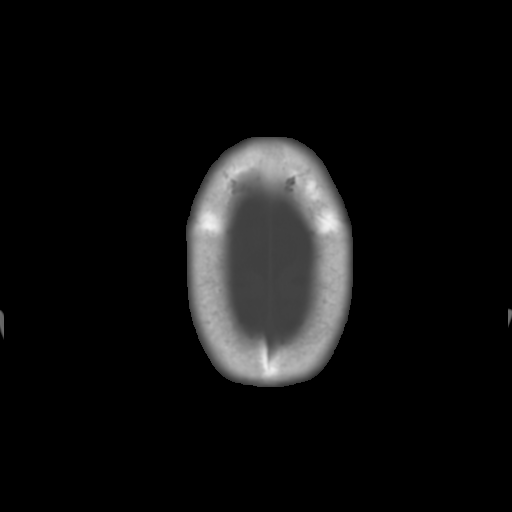

[Series 202: head w/o bone, idose (1) · axial · non-contrast · 0.49mm/px · z∈[+249,+269]mm · 2 of 30 slices shown]
[im 3/30  bone]
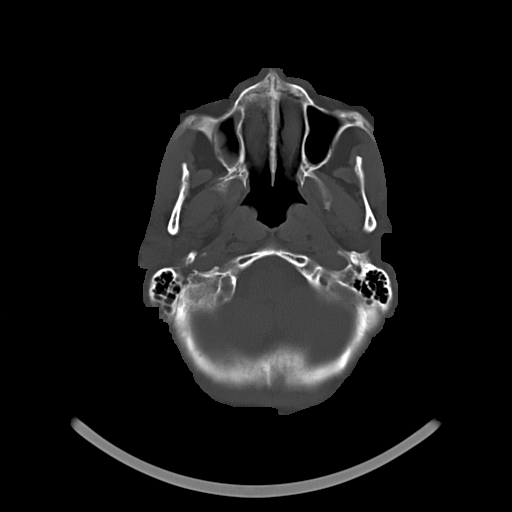
[im 7/30  bone]
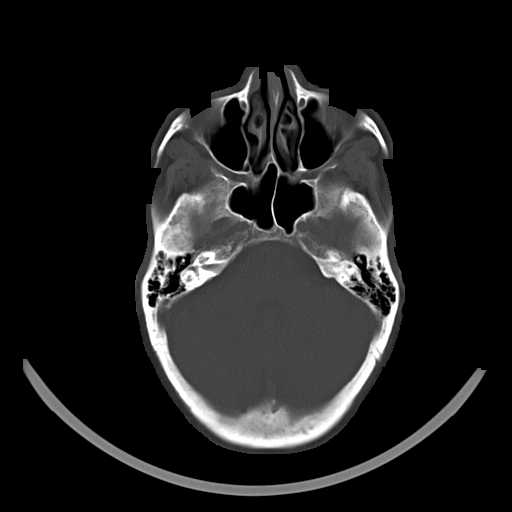

[15 of 30 positions shown; findings below may reference images not displayed]

FINDINGS: Mild cerebral atrophy. Patchy and confluent areas of decreased
attenuation are noted throughout the deep and periventricular white
matter of the cerebral hemispheres bilaterally, compatible with
chronic microvascular ischemic disease. No acute intracranial
abnormalities. Specifically, no evidence of acute intracranial
hemorrhage, no definite findings of acute/subacute cerebral
ischemia, no mass, mass effect, hydrocephalus or abnormal intra or
extra-axial fluid collections. Visualized paranasal sinuses and
mastoids are well pneumatized. No acute displaced skull fractures
are identified.
IMPRESSION: 1. No acute intracranial abnormalities.
2. Mild cerebral atrophy with chronic microvascular ischemic changes
in cerebral white matter, as above.

## 2017-03-06 IMAGING — DX DG CHEST 2V
3 series · 3 of 3 positions shown · non-contrast
Comparison: Chest x-ray 09/01/2015.

CLINICAL DATA: 88-year-old female with history shortness of breath
and productive cough. Thick clear mucus production for 1 month.

EXAM:
CHEST  2 VIEW

[x chest ap]
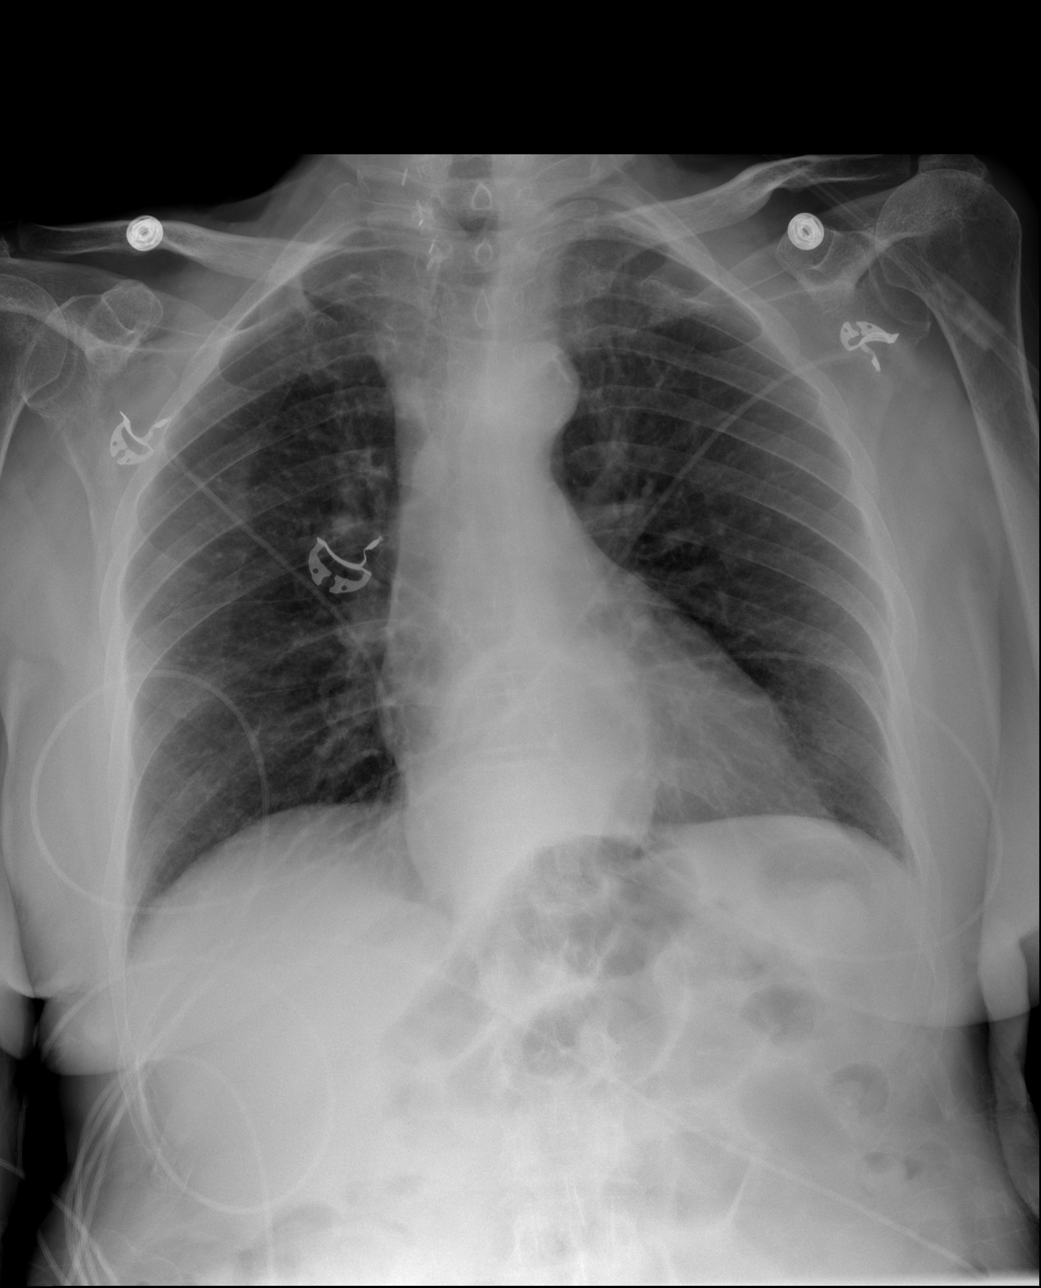

[w chest lat (1 of 2)]
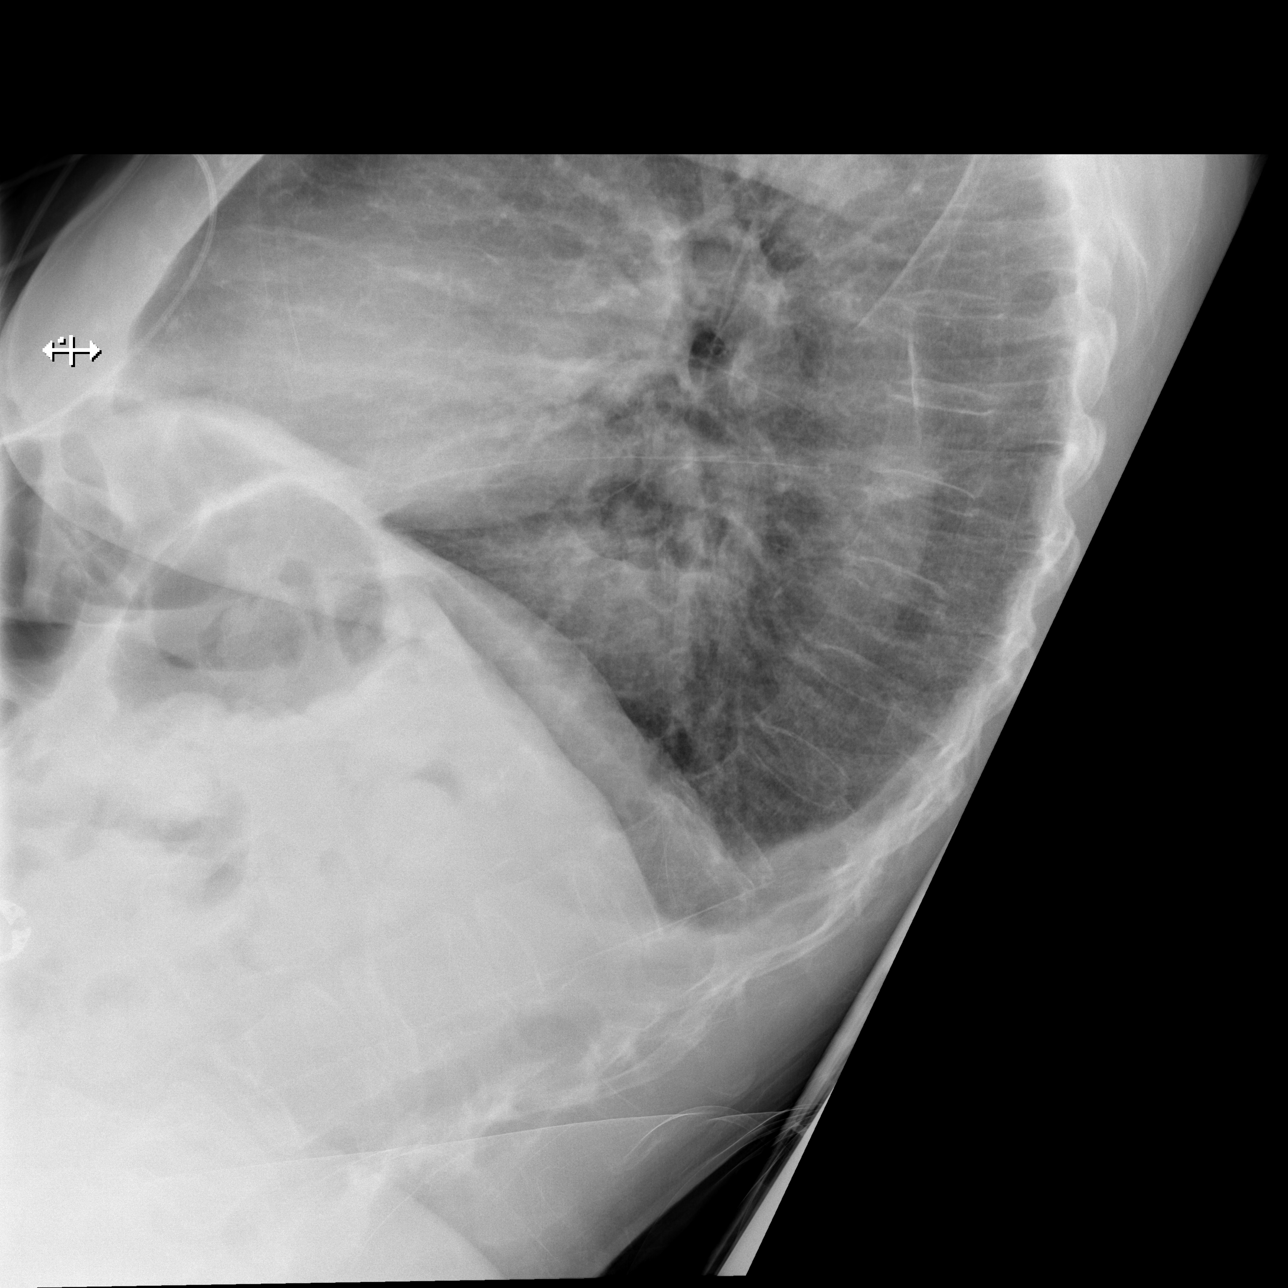

[w chest lat (2 of 2)]
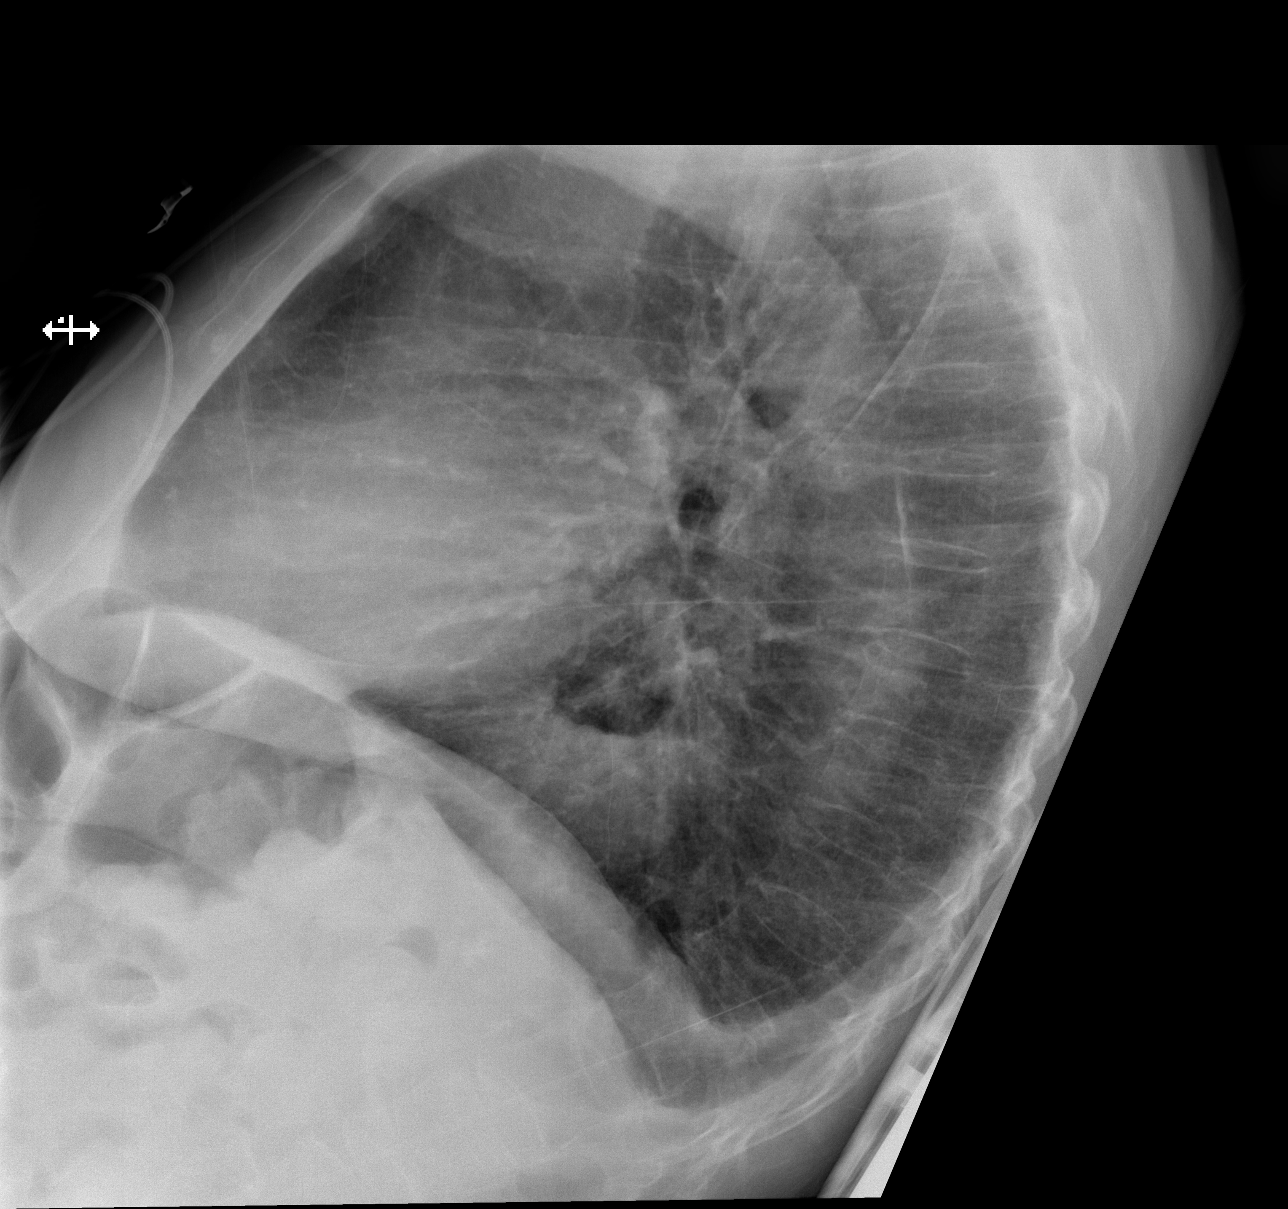

[3 of 3 positions shown; findings below may reference images not displayed]

FINDINGS: Lung volumes are normal. No consolidative airspace disease. With
trace bilateral pleural effusions. Very mild diffuse peribronchial
cuffing. No pneumothorax. No suspicious appearing pulmonary nodules
or masses. No evidence of pulmonary edema. Heart size is normal.
Upper mediastinal contours are within normal limits. Large hiatal
hernia. Surgical clips on the right side of the thoracic inlet,
likely from prior thyroid surgery.
IMPRESSION: 1. Mild diffuse peribronchial cuffing suggesting a mild acute
bronchitis.
2. Trace bilateral pleural effusions.
3. Atherosclerosis.

## 2017-03-24 ENCOUNTER — Other Ambulatory Visit: Payer: Self-pay | Admitting: Physician Assistant

## 2017-03-24 DIAGNOSIS — K449 Diaphragmatic hernia without obstruction or gangrene: Secondary | ICD-10-CM

## 2017-03-24 DIAGNOSIS — K219 Gastro-esophageal reflux disease without esophagitis: Secondary | ICD-10-CM | POA: Diagnosis not present

## 2017-03-24 DIAGNOSIS — R1310 Dysphagia, unspecified: Secondary | ICD-10-CM

## 2017-03-24 DIAGNOSIS — K224 Dyskinesia of esophagus: Secondary | ICD-10-CM | POA: Diagnosis not present

## 2017-03-31 ENCOUNTER — Ambulatory Visit
Admission: RE | Admit: 2017-03-31 | Discharge: 2017-03-31 | Disposition: A | Discharge status: Home / Self Care | Payer: Medicare Other | Source: Ambulatory Visit | Attending: Physician Assistant | Admitting: Physician Assistant

## 2017-03-31 DIAGNOSIS — K219 Gastro-esophageal reflux disease without esophagitis: Secondary | ICD-10-CM

## 2017-03-31 DIAGNOSIS — R1310 Dysphagia, unspecified: Secondary | ICD-10-CM

## 2017-03-31 DIAGNOSIS — K449 Diaphragmatic hernia without obstruction or gangrene: Secondary | ICD-10-CM

## 2017-04-03 DIAGNOSIS — Z23 Encounter for immunization: Secondary | ICD-10-CM | POA: Diagnosis not present

## 2017-04-15 DIAGNOSIS — M1712 Unilateral primary osteoarthritis, left knee: Secondary | ICD-10-CM | POA: Diagnosis not present

## 2017-04-15 DIAGNOSIS — M1711 Unilateral primary osteoarthritis, right knee: Secondary | ICD-10-CM | POA: Diagnosis not present

## 2017-04-22 DIAGNOSIS — M1711 Unilateral primary osteoarthritis, right knee: Secondary | ICD-10-CM | POA: Diagnosis not present

## 2017-04-22 DIAGNOSIS — M1712 Unilateral primary osteoarthritis, left knee: Secondary | ICD-10-CM | POA: Diagnosis not present

## 2017-04-29 DIAGNOSIS — M1712 Unilateral primary osteoarthritis, left knee: Secondary | ICD-10-CM | POA: Diagnosis not present

## 2017-04-29 DIAGNOSIS — M1711 Unilateral primary osteoarthritis, right knee: Secondary | ICD-10-CM | POA: Diagnosis not present

## 2017-04-30 ENCOUNTER — Ambulatory Visit: Payer: Medicare Other | Admitting: Rheumatology

## 2017-05-20 NOTE — Progress Notes (Deleted)
Office Visit Note  Patient: Donna Burch             Date of Birth: 02-18-1927           MRN: [id number]             PCP: Harlan Stains, MD Referring: Harlan Stains, MD Visit Date: 06/02/2017 Occupation: @GUAROCC @    Subjective:  No chief complaint on file.   History of Present Illness: Donna Burch is a 83 y.o. female ***   Activities of Daily Living:  Patient reports morning stiffness for *** {minute/hour:19697}.   Patient {ACTIONS;DENIES/REPORTS:21021675::"Denies"} nocturnal pain.  Difficulty dressing/grooming: {ACTIONS;DENIES/REPORTS:21021675::"Denies"} Difficulty climbing stairs: {ACTIONS;DENIES/REPORTS:21021675::"Denies"} Difficulty getting out of chair: {ACTIONS;DENIES/REPORTS:21021675::"Denies"} Difficulty using hands for taps, buttons, cutlery, and/or writing: {ACTIONS;DENIES/REPORTS:21021675::"Denies"}   No Rheumatology ROS completed.   PMFS History:  Patient Active Problem List   Diagnosis Date Noted  . Inflammatory arthritis 05/29/2016  . Pain in joint involving multiple sites 05/29/2016  . Screening-pulmonary TB 05/29/2016  . High risk medication use 04/25/2016  . Bradycardia   . Symptomatic bradycardia   . Junctional escape rhythm   . Orthostatic hypotension   . Thyroid activity decreased   . Hypomagnesemia   . Syncope 09/01/2015  . Nausea & vomiting 09/01/2015  . Hyponatremia 09/01/2015  . Cough 09/01/2015  . Hypertension   . Hyperlipidemia   . GERD (gastroesophageal reflux disease)   . Hypothyroidism   . Gastroesophageal reflux disease without esophagitis   . CLL (chronic lymphocytic leukemia) ([place]) 07/14/2015  . Thyroid nodule, right 01/03/2015  . Thyroid nodule 01/03/2015  . Pelvic relaxation 04/08/2012  . Kidney stones 04/08/2012  . Recurrent UTI (urinary tract infection) 04/08/2012    Past Medical History:  Diagnosis Date  . Arthritis    "right middle finger; hands" (01/03/2015)  . CLL (chronic lymphocytic leukemia) ([place])   .  Dyspnea on exertion    Improved with exercise  . Elevated brain natriuretic peptide (BNP) level    Slight  . Family history of adverse reaction to anesthesia    son, Marita Snellen, w/PONV  . Frequent UTI    "used to; none lately" (01/03/2015)  . Gastroenteritis 09/2010   c. dificile.  hospitalized in ICU x 3 days  . GERD (gastroesophageal reflux disease)   . Hiatal hernia   . History of Clostridium difficile ?03/2012   "after taking ATB for one of my UTI's"  . Hyperlipidemia   . Hypertension   . Hypothyroidism   . Nephrolithiasis   . Nocturia    x3  . Pneumonia    Required hospital stay at the time  . PONV (postoperative nausea and vomiting)   . Renal insufficiency   . Seasonal allergies   . Skin cancer    right anterior neck  . Wears glasses     Family History  Problem Relation Age of Onset  . Heart attack Mother   . Hyperlipidemia Mother   . Asthma Father   . Diabetes Father    Past Surgical History:  Procedure Laterality Date  . CATARACT EXTRACTION, BILATERAL  2010  . COLONOSCOPY    . CYSTOSCOPY W/ STONE MANIPULATION Right   . CYSTOSCOPY WITH STENT PLACEMENT    . ESOPHAGOGASTRODUODENOSCOPY    . ESOPHAGOGASTRODUODENOSCOPY (EGD) WITH ESOPHAGEAL DILATION    . KNEE ARTHROSCOPY Left   . LITHOTRIPSY     "didn't work"  . SKIN CANCER EXCISION Right    anterior neck  . THYROID LOBECTOMY Right 01/03/2015  . THYROID LOBECTOMY Right  01/03/2015   Procedure: RIGHT THYROID LOBECTOMY;  Surgeon: Armandina Gemma, MD;  Location: [facility];  Service: General;  Laterality: Right;  . TONSILLECTOMY    . WISDOM TOOTH EXTRACTION     Social History   Social History Narrative  . Not on file     Objective: Vital Signs: There were no vitals taken for this visit.   Physical Exam   Musculoskeletal Exam: ***  CDAI Exam: No CDAI exam completed.    Investigation: No additional findings.PLQ eye exam: 04/12/2016 CBC Latest Ref Rng & Units 01/17/2017 12/30/2016 07/19/2016  WBC 3.9 - 10.3 10e3/uL  13.7(H) 8.7 10.7(H)  Hemoglobin 11.6 - 15.9 g/dL 11.4(L) 10.6(L) 10.5(L)  Hematocrit 34.8 - 46.6 % 34.4(L) 32.3(L) 31.5(L)  Platelets 145 - 400 10e3/uL 216 248 232   CMP Latest Ref Rng & Units 01/17/2017 12/30/2016 07/19/2016  Glucose 70 - 140 mg/dl 95 77 93  BUN 7.0 - 26.0 mg/dL 22.8 19 21.6  Creatinine 0.6 - 1.1 mg/dL 1.0 0.96(H) 0.8  Sodium 136 - 145 mEq/L 139 139 139  Potassium 3.5 - 5.1 mEq/L 4.5 4.4 4.2  Chloride 98 - 110 mmol/L - 106 -  CO2 22 - 29 mEq/L 22 22 22   Calcium 8.4 - 10.4 mg/dL 10.0 8.9 9.5  Total Protein 6.4 - 8.3 g/dL 7.5 6.7 7.2  Total Bilirubin 0.20 - 1.20 mg/dL 0.53 0.4 0.56  Alkaline Phos 40 - 150 U/L 90 84 104  AST 5 - 34 U/L 27 23 27   ALT 0 - 55 U/L 21 15 19     Imaging: No results found.  Speciality Comments: No specialty comments available.    Procedures:  No procedures performed Allergies: Prednisone and Reclast [zoledronic acid]   Assessment / Plan:     Visit Diagnoses: No diagnosis found.    Orders: No orders of the defined types were placed in this encounter.  No orders of the defined types were placed in this encounter.   Face-to-face time spent with patient was *** minutes. 50% of time was spent in counseling and coordination of care.  Follow-Up Instructions: No Follow-up on file.   Earnestine Mealing, CMA  Note - This record has been created using Editor, commissioning.  Chart creation errors have been sought, but may not always  have been located. Such creation errors do not reflect on  the standard of medical care.

## 2017-05-29 DIAGNOSIS — M1712 Unilateral primary osteoarthritis, left knee: Secondary | ICD-10-CM | POA: Diagnosis not present

## 2017-05-29 DIAGNOSIS — M1711 Unilateral primary osteoarthritis, right knee: Secondary | ICD-10-CM | POA: Diagnosis not present

## 2017-06-02 ENCOUNTER — Ambulatory Visit: Payer: Medicare Other | Admitting: Rheumatology

## 2017-06-10 ENCOUNTER — Other Ambulatory Visit: Payer: Self-pay | Admitting: Rheumatology

## 2017-06-10 NOTE — Telephone Encounter (Signed)
Patient is no longer coming to see our office.

## 2017-07-15 DIAGNOSIS — E559 Vitamin D deficiency, unspecified: Secondary | ICD-10-CM | POA: Diagnosis not present

## 2017-07-15 DIAGNOSIS — Z136 Encounter for screening for cardiovascular disorders: Secondary | ICD-10-CM | POA: Diagnosis not present

## 2017-07-15 DIAGNOSIS — Z0000 Encounter for general adult medical examination without abnormal findings: Secondary | ICD-10-CM | POA: Diagnosis not present

## 2017-07-15 DIAGNOSIS — E871 Hypo-osmolality and hyponatremia: Secondary | ICD-10-CM | POA: Diagnosis not present

## 2017-07-15 DIAGNOSIS — E785 Hyperlipidemia, unspecified: Secondary | ICD-10-CM | POA: Diagnosis not present

## 2017-07-15 DIAGNOSIS — E890 Postprocedural hypothyroidism: Secondary | ICD-10-CM | POA: Diagnosis not present

## 2017-07-15 DIAGNOSIS — I700 Atherosclerosis of aorta: Secondary | ICD-10-CM | POA: Diagnosis not present

## 2017-07-15 DIAGNOSIS — C9110 Chronic lymphocytic leukemia of B-cell type not having achieved remission: Secondary | ICD-10-CM | POA: Diagnosis not present

## 2017-07-15 DIAGNOSIS — I129 Hypertensive chronic kidney disease with stage 1 through stage 4 chronic kidney disease, or unspecified chronic kidney disease: Secondary | ICD-10-CM | POA: Diagnosis not present

## 2017-07-15 DIAGNOSIS — M1990 Unspecified osteoarthritis, unspecified site: Secondary | ICD-10-CM | POA: Diagnosis not present

## 2017-07-15 DIAGNOSIS — N183 Chronic kidney disease, stage 3 (moderate): Secondary | ICD-10-CM | POA: Diagnosis not present

## 2017-07-16 ENCOUNTER — Telehealth: Payer: Self-pay | Admitting: Hematology

## 2017-07-16 NOTE — Telephone Encounter (Signed)
CME - moved lab/fu from 1/25 to 2/4. Left message for both patient dtr Sharee Pimple re change.

## 2017-07-18 ENCOUNTER — Other Ambulatory Visit: Payer: Medicare Other

## 2017-07-18 ENCOUNTER — Ambulatory Visit: Payer: Medicare Other | Admitting: Hematology

## 2017-07-18 DIAGNOSIS — R740 Nonspecific elevation of levels of transaminase and lactic acid dehydrogenase [LDH]: Secondary | ICD-10-CM | POA: Diagnosis not present

## 2017-07-25 DIAGNOSIS — R740 Nonspecific elevation of levels of transaminase and lactic acid dehydrogenase [LDH]: Secondary | ICD-10-CM | POA: Diagnosis not present

## 2017-07-28 ENCOUNTER — Ambulatory Visit: Payer: Medicare Other | Admitting: Hematology

## 2017-07-28 ENCOUNTER — Other Ambulatory Visit: Payer: Medicare Other

## 2017-08-01 NOTE — Progress Notes (Signed)
Office Visit Note  Patient: Donna Burch             Date of Birth: 1927-01-14           MRN: [id number]             PCP: Harlan Stains, MD Referring: Harlan Stains, MD Visit Date: 08/08/2017 Occupation: @GUAROCC @    Subjective:  Hand pain bilaterally    History of Present Illness: Donna Burch is a 84 y.o. female with history of inflammatory arthritis.  Patient states she does not know if she is still taking her PLQ.  On her medication list PLQ is crossed out.  She has not had an eye exam since January 2018.   She states that she continues to have pain in her hands and bilateral knees.  She has several fingers that trigger.  She states she experiences occasional swelling in her left knee. She continues to have significant morning stiffness in her joints.     Activities of Daily Living:  Patient reports morning stiffness for 2-3 hours.   Patient Reports nocturnal pain.  Difficulty dressing/grooming: Denies Difficulty climbing stairs: Reports  Difficulty getting out of chair: Reports Difficulty using hands for taps, buttons, cutlery, and/or writing: Reports   Review of Systems  Constitutional: Positive for fatigue. Negative for weakness.  HENT: Positive for mouth dryness. Negative for mouth sores and nose dryness.   Eyes: Positive for dryness. Negative for pain, redness and visual disturbance.  Respiratory: Negative for cough, hemoptysis, shortness of breath and difficulty breathing.   Cardiovascular: Positive for hypertension. Negative for chest pain, palpitations, irregular heartbeat and swelling in legs/feet.  Gastrointestinal: Negative for blood in stool, constipation and diarrhea.  Endocrine: Negative for increased urination.  Genitourinary: Negative for painful urination.  Musculoskeletal: Positive for arthralgias, joint pain, joint swelling, morning stiffness and muscle tenderness. Negative for myalgias, muscle weakness and myalgias.  Skin: Negative for color  change, rash, hair loss, nodules/bumps, redness, skin tightness, ulcers and sensitivity to sunlight.  Neurological: Negative for dizziness, numbness and headaches.  Hematological: Negative for swollen glands.  Psychiatric/Behavioral: Negative for depressed mood and sleep disturbance. The patient is not nervous/anxious.     PMFS History:  Patient Active Problem List   Diagnosis Date Noted  . Inflammatory arthritis 05/29/2016  . Pain in joint involving multiple sites 05/29/2016  . Screening-pulmonary TB 05/29/2016  . High risk medication use 04/25/2016  . Bradycardia   . Symptomatic bradycardia   . Junctional escape rhythm   . Orthostatic hypotension   . Thyroid activity decreased   . Hypomagnesemia   . Syncope 09/01/2015  . Nausea & vomiting 09/01/2015  . Hyponatremia 09/01/2015  . Cough 09/01/2015  . Hypertension   . Hyperlipidemia   . GERD (gastroesophageal reflux disease)   . Hypothyroidism   . Gastroesophageal reflux disease without esophagitis   . CLL (chronic lymphocytic leukemia) ([place]) 07/14/2015  . Thyroid nodule, right 01/03/2015  . Thyroid nodule 01/03/2015  . Pelvic relaxation 04/08/2012  . Kidney stones 04/08/2012  . Recurrent UTI (urinary tract infection) 04/08/2012    Past Medical History:  Diagnosis Date  . Arthritis    "right middle finger; hands" (01/03/2015)  . CLL (chronic lymphocytic leukemia) ([place])   . Dyspnea on exertion    Improved with exercise  . Elevated brain natriuretic peptide (BNP) level    Slight  . Family history of adverse reaction to anesthesia    son, Marita Snellen, w/PONV  . Frequent UTI    "  used to; none lately" (01/03/2015)  . Gastroenteritis 09/2010   c. dificile.  hospitalized in ICU x 3 days  . GERD (gastroesophageal reflux disease)   . Hiatal hernia   . History of Clostridium difficile ?03/2012   "after taking ATB for one of my UTI's"  . Hyperlipidemia   . Hypertension   . Hypothyroidism   . Nephrolithiasis   . Nocturia    x3  .  Pneumonia    Required hospital stay at the time  . PONV (postoperative nausea and vomiting)   . Renal insufficiency   . Seasonal allergies   . Skin cancer    right anterior neck  . Wears glasses     Family History  Problem Relation Age of Onset  . Heart attack Mother   . Hyperlipidemia Mother   . Asthma Father   . Diabetes Father    Past Surgical History:  Procedure Laterality Date  . CATARACT EXTRACTION, BILATERAL  2010  . COLONOSCOPY    . CYSTOSCOPY W/ STONE MANIPULATION Right   . CYSTOSCOPY WITH STENT PLACEMENT    . ESOPHAGOGASTRODUODENOSCOPY    . ESOPHAGOGASTRODUODENOSCOPY (EGD) WITH ESOPHAGEAL DILATION    . KNEE ARTHROSCOPY Left   . LITHOTRIPSY     "didn't work"  . SKIN CANCER EXCISION Right    anterior neck  . THYROID LOBECTOMY Right 01/03/2015  . THYROID LOBECTOMY Right 01/03/2015   Procedure: RIGHT THYROID LOBECTOMY;  Surgeon: Armandina Gemma, MD;  Location: [facility];  Service: General;  Laterality: Right;  . TONSILLECTOMY    . WISDOM TOOTH EXTRACTION     Social History   Social History Narrative  . Not on file     Objective: Vital Signs: BP (!) 158/78 (BP Location: Left Arm, Patient Position: Sitting, Cuff Size: Normal)   Pulse 89   Resp 17   Ht 5' 3.5" (1.613 m)   Wt 140 lb 8 oz (63.7 kg)   BMI 24.50 kg/m    Physical Exam  Constitutional: She is oriented to person, place, and time. She appears well-developed and well-nourished.  HENT:  Head: Normocephalic and atraumatic.  Eyes: Conjunctivae and EOM are normal.  Neck: Normal range of motion.  Cardiovascular: Normal rate, regular rhythm, normal heart sounds and intact distal pulses.  Pulmonary/Chest: Effort normal and breath sounds normal.  Abdominal: Soft. Bowel sounds are normal.  Lymphadenopathy:    She has no cervical adenopathy.  Neurological: She is alert and oriented to person, place, and time.  Skin: Skin is warm and dry. Capillary refill takes less than 2 seconds.  Psychiatric: She has a normal  mood and affect. Her behavior is normal.  Nursing note and vitals reviewed.    Musculoskeletal Exam: C-spine limited ROM with lateral rotation. Thoracic kyphosis.  She has limited thoracic and lumbar ROM.  She has midline spinal tenderness in the lumbar region.  No SI joint tenderness.  Shoulder joints limited abduction bilaterally.  She has mild elbow contractures bilaterally.  Wrist joints, MCPs, PIPs, and DIPs good ROM with no synovitis.  She has synovitis of her right 2nd MCP.  She has synovial thickening of all MCPs, PIPs, and DIPs.  She has swan neck deformities of several fingers.  She has tenderness of her right second MCP. Hip joints, knee joints, ankle joints, MTPs, PIPs, and DIPs good ROM.  She has limited extension of bilateral knees.  She has valgus deformity of left knee.  No warmth on exam.  She has bilateral knee crepitus.  No tenderness  of trochanteric bursa.   CDAI Exam: CDAI Homunculus Exam:   Joint Counts:  CDAI Tender Joint count: 0 CDAI Swollen Joint count: 0  Global Assessments:  Patient Global Assessment: 5   CDAI Calculated Score: 5    Investigation: No additional findings. CBC Latest Ref Rng & Units 01/17/2017 12/30/2016 07/19/2016  WBC 3.9 - 10.3 10e3/uL 13.7(H) 8.7 10.7(H)  Hemoglobin 11.6 - 15.9 g/dL 11.4(L) 10.6(L) 10.5(L)  Hematocrit 34.8 - 46.6 % 34.4(L) 32.3(L) 31.5(L)  Platelets 145 - 400 10e3/uL 216 248 232   CMP Latest Ref Rng & Units 01/17/2017 12/30/2016 07/19/2016  Glucose 70 - 140 mg/dl 95 77 93  BUN 7.0 - 26.0 mg/dL 22.8 19 21.6  Creatinine 0.6 - 1.1 mg/dL 1.0 0.96(H) 0.8  Sodium 136 - 145 mEq/L 139 139 139  Potassium 3.5 - 5.1 mEq/L 4.5 4.4 4.2  Chloride 98 - 110 mmol/L - 106 -  CO2 22 - 29 mEq/L 22 22 22   Calcium 8.4 - 10.4 mg/dL 10.0 8.9 9.5  Total Protein 6.4 - 8.3 g/dL 7.5 6.7 7.2  Total Bilirubin 0.20 - 1.20 mg/dL 0.53 0.4 0.56  Alkaline Phos 40 - 150 U/L 90 84 104  AST 5 - 34 U/L 27 23 27   ALT 0 - 55 U/L 21 15 19     Imaging: No  results found.  Speciality Comments: No specialty comments available.    Procedures:  No procedures performed Allergies: Prednisone and Reclast [zoledronic acid]   Assessment / Plan:     Visit Diagnoses: Inflammatory arthritis: She has possible synovitis and tenderness of her right second MCP.  She has synovial thickening of all MCPs and PIPs.  She has swan neck deformities and trigger fingers of several digits.  She will continue on Plaquenil, 1 tablet in the morning and 1/2 tablet in the evening daily.  She was advised to call her PCP to see if she was told to discontinue it or not.  She is unsure if she has been taking it daily.  She was given a plaquenil eye exam form and she was advised to schedule an appointment soon.       High risk medication use - PLQ 200 mg, 1 tablet in the morning, 1/2 tablet in the evening-Eye exam: January 2018. Labs: CBC and CMP on 07/15/17.  Routine labs will be repeated in 5 months.  She was given a plaquenil eye exam form.    CLL (chronic lymphocytic leukemia) ([place]): She sees Dr. Irene Limbo.    Other medical conditions are listed as follows:   Hyponatremia  Essential hypertension  Gastroesophageal reflux disease without esophagitis  Symptomatic bradycardia  Other specified hypothyroidism    Orders: No orders of the defined types were placed in this encounter.  No orders of the defined types were placed in this encounter.   Face-to-face time spent with patient was 30 minutes. Greater than 50% of time was spent in counseling and coordination of care.  Follow-Up Instructions: Return in about 5 months (around 01/05/2018) for Inflammatory arthritis .   Ofilia Neas, PA-C  Note - This record has been created using Dragon software.  Chart creation errors have been sought, but may not always  have been located. Such creation errors do not reflect on  the standard of medical care.

## 2017-08-08 ENCOUNTER — Encounter: Payer: Self-pay | Admitting: Physician Assistant

## 2017-08-08 ENCOUNTER — Ambulatory Visit (INDEPENDENT_AMBULATORY_CARE_PROVIDER_SITE_OTHER): Payer: Medicare Other | Admitting: Physician Assistant

## 2017-08-08 VITALS — BP 158/78 | HR 89 | Resp 17 | Ht 63.5 in | Wt 140.5 lb

## 2017-08-08 DIAGNOSIS — C9190 Lymphoid leukemia, unspecified not having achieved remission: Secondary | ICD-10-CM | POA: Diagnosis not present

## 2017-08-08 DIAGNOSIS — M1380 Other specified arthritis, unspecified site: Secondary | ICD-10-CM

## 2017-08-08 DIAGNOSIS — E038 Other specified hypothyroidism: Secondary | ICD-10-CM

## 2017-08-08 DIAGNOSIS — R001 Bradycardia, unspecified: Secondary | ICD-10-CM

## 2017-08-08 DIAGNOSIS — M1990 Unspecified osteoarthritis, unspecified site: Secondary | ICD-10-CM

## 2017-08-08 DIAGNOSIS — Z79899 Other long term (current) drug therapy: Secondary | ICD-10-CM | POA: Diagnosis not present

## 2017-08-08 DIAGNOSIS — I10 Essential (primary) hypertension: Secondary | ICD-10-CM | POA: Diagnosis not present

## 2017-08-08 DIAGNOSIS — E871 Hypo-osmolality and hyponatremia: Secondary | ICD-10-CM | POA: Diagnosis not present

## 2017-08-08 DIAGNOSIS — K219 Gastro-esophageal reflux disease without esophagitis: Secondary | ICD-10-CM

## 2017-08-08 DIAGNOSIS — C9110 Chronic lymphocytic leukemia of B-cell type not having achieved remission: Secondary | ICD-10-CM

## 2017-08-14 NOTE — Progress Notes (Signed)
[name]  HEMATOLOGY ONCOLOGY PROGRESS NOTE  Date of service: 08/15/17  Patient Care Team: Harlan Stains, MD as PCP - General (Family Medicine)  CC: f/u for continued mx of CLL  Diagnosis: CLL Rai Stage 0  Current Treatment: Observation  INTERVAL HISTORY:  Donna Burch is here for her scheduled follow-up and is accompanied by her grandson. She is doing well and has no new concerns. She reports her PCP has taken her off of two medications (statin and plaquenil) and since then she has been feeling much better. However, the patient does add that her arthritis makes it hard for her to move around a lot. Donna Burch reports that she stays on the colder side and she has her thermostat set to 77 degrees at home. Her labs are stable today.  On review of systems, pt denies any abnormal masses, fever, chills, weight loss, decreased appetite, decreased energy levels, mouth sores and bilateral lower extremity. Denies pain. Pt denies abdominal pain, nausea, vomiting and bladder and bowel changes.   REVIEW OF SYSTEMS:    .10 Point review of Systems was done is negative except as noted above.  . Past Medical History:  Diagnosis Date  . Arthritis    "right middle finger; hands" (01/03/2015)  . CLL (chronic lymphocytic leukemia) ([place])   . Dyspnea on exertion    Improved with exercise  . Elevated brain natriuretic peptide (BNP) level    Slight  . Family history of adverse reaction to anesthesia    son, Marita Snellen, w/PONV  . Frequent UTI    "used to; none lately" (01/03/2015)  . Gastroenteritis 09/2010   c. dificile.  hospitalized in ICU x 3 days  . GERD (gastroesophageal reflux disease)   . Hiatal hernia   . History of Clostridium difficile ?03/2012   "after taking ATB for one of my UTI's"  . Hyperlipidemia   . Hypertension   . Hypothyroidism   . Nephrolithiasis   . Nocturia    x3  . Pneumonia    Required hospital stay at the time  . PONV (postoperative nausea and vomiting)   . Renal insufficiency     . Seasonal allergies   . Skin cancer    right anterior neck  . Wears glasses      Past Surgical History:  Procedure Laterality Date  . CATARACT EXTRACTION, BILATERAL  2010  . COLONOSCOPY    . CYSTOSCOPY W/ STONE MANIPULATION Right   . CYSTOSCOPY WITH STENT PLACEMENT    . ESOPHAGOGASTRODUODENOSCOPY    . ESOPHAGOGASTRODUODENOSCOPY (EGD) WITH ESOPHAGEAL DILATION    . KNEE ARTHROSCOPY Left   . LITHOTRIPSY     "didn't work"  . SKIN CANCER EXCISION Right    anterior neck  . THYROID LOBECTOMY Right 01/03/2015  . THYROID LOBECTOMY Right 01/03/2015   Procedure: RIGHT THYROID LOBECTOMY;  Surgeon: Armandina Gemma, MD;  Location: [facility];  Service: General;  Laterality: Right;  . TONSILLECTOMY    . WISDOM TOOTH EXTRACTION      Social History   Tobacco Use  . Smoking status: Never Smoker  . Smokeless tobacco: Never Used  Substance Use Topics  . Alcohol use: No  . Drug use: No    ALLERGIES:  is allergic to prednisone and reclast [zoledronic acid].  MEDICATIONS:  Current Outpatient Medications  Medication Sig Dispense Refill  . aspirin 81 MG tablet Take 81 mg by mouth daily.    . B Complex-C (SUPER B COMPLEX PO) Take by mouth daily.    . cholecalciferol (  VITAMIN D) 1000 UNITS tablet Take 2,000 Units by mouth daily.     . feeding supplement, ENSURE ENLIVE, (ENSURE ENLIVE) LIQD Take 237 mLs by mouth daily. (Patient taking differently: Take 237 mLs by mouth as needed. ) 237 mL 12  . fluticasone (FLONASE) 50 MCG/ACT nasal spray     . ferrous sulfate 325 (65 FE) MG EC tablet Take 325 mg by mouth daily with breakfast.    . losartan (COZAAR) 100 MG tablet Take 100 mg by mouth daily.     . magnesium chloride (SLOW-MAG) 64 MG TBEC SR tablet Take 1 tablet by mouth 2 (two) times daily.     . Multiple Vitamins-Minerals (MULTIVITAMIN ADULTS PO) Take by mouth daily.    . Nutritional Supplements (COLON FORMULA PO) Take 1 capsule by mouth daily.     [name] omeprazole (PRILOSEC) 20 MG capsule Take 20 mg by  mouth daily.     No current facility-administered medications for this visit.     PHYSICAL EXAMINATION: ECOG PERFORMANCE STATUS: 2 - Symptomatic, <50% confined to bed   Vitals:   08/15/17 1122  BP: (!) 165/64  Pulse: 80  Resp: 18  Temp: 98.1 F (36.7 C)  SpO2: 99%    Filed Weights   08/15/17 1122  Weight: 138 lb 6.4 oz (62.8 kg)   .Body mass index is 24.13 kg/m.  [name] GENERAL:alert, in no acute distress and comfortable SKIN: no acute rashes, no significant lesions EYES: conjunctiva are pink and non-injected, sclera anicteric OROPHARYNX: MMM, no exudates, no oropharyngeal erythema or ulceration NECK: supple, no JVD LYMPH:  no palpable lymphadenopathy in the cervical, axillary or inguinal regions LUNGS: clear to auscultation b/l with normal respiratory effort HEART: regular rate & rhythm ABDOMEN:  normoactive bowel sounds , non tender, not distended. Extremity: no pedal edema PSYCH: alert & oriented x 3 with fluent speech NEURO: no focal motor/sensory deficits   LABORATORY DATA:   I have reviewed the data as listed  . CBC Latest Ref Rng & Units 08/15/2017 01/17/2017 12/30/2016  WBC 3.9 - 10.3 K/uL 9.7 13.7(H) 8.7  Hemoglobin 11.6 - 15.9 g/dL 10.9(L) 11.4(L) 10.6(L)  Hematocrit 34.8 - 46.6 % 33.4(L) 34.4(L) 32.3(L)  Platelets 145 - 400 K/uL 203 216 248   . CBC    Component Value Date/Time   WBC 9.7 08/15/2017 1038   RBC 3.47 (L) 08/15/2017 1038   RBC 3.47 (L) 08/15/2017 1038   HGB 10.9 (L) 08/15/2017 1038   HGB 11.4 (L) 01/17/2017 1122   HCT 33.4 (L) 08/15/2017 1038   HCT 34.4 (L) 01/17/2017 1122   PLT 203 08/15/2017 1038   PLT 216 01/17/2017 1122   MCV 96.3 08/15/2017 1038   MCV 94.2 01/17/2017 1122   MCH 31.4 08/15/2017 1038   MCHC 32.6 08/15/2017 1038   RDW 13.4 08/15/2017 1038   RDW 13.6 01/17/2017 1122   LYMPHSABS 6.3 (H) 08/15/2017 1038   LYMPHSABS 7.6 (H) 01/17/2017 1122   MONOABS 0.6 08/15/2017 1038   MONOABS 0.7 01/17/2017 1122   EOSABS 0.1  08/15/2017 1038   EOSABS 0.1 01/17/2017 1122   BASOSABS 0.0 08/15/2017 1038   BASOSABS 0.0 01/17/2017 1122     . CMP Latest Ref Rng & Units 08/15/2017 01/17/2017 12/30/2016  Glucose 70 - 140 mg/dL 91 95 77  BUN 7 - 26 mg/dL 24 22.8 19  Creatinine 0.60 - 1.10 mg/dL 0.96 1.0 0.96(H)  Sodium 136 - 145 mmol/L 142 139 139  Potassium 3.5 - 5.1 mmol/L 3.9 4.5 4.4  Chloride 98 - 109 mmol/L 110(H) - 106  CO2 22 - 29 mmol/L 23 22 22   Calcium 8.4 - 10.4 mg/dL 9.4 10.0 8.9  Total Protein 6.4 - 8.3 g/dL 7.3 7.5 6.7  Total Bilirubin 0.2 - 1.2 mg/dL 0.3 0.53 0.4  Alkaline Phos 40 - 150 U/L 84 90 84  AST 5 - 34 U/L 25 27 23   ALT 0 - 55 U/L 16 21 15      RADIOGRAPHIC STUDIES: I have personally reviewed the radiological images as listed and agreed with the findings in the report. No results found.  ASSESSMENT & PLAN:   84 y.o. female with multiple medical comorbidities as noted above.  1) CLL. Rai stage 0. FISH panel shows 13q deletion -likely representing good prognostic indicator Patient has no clinically discernible lymphadenopathy, splenomegaly. Stable chronic mild anemia which remains stable but this seems to be less likely from CLL. Given her pelgeroid neutrophils and acanthocytes cannot rule out some element of age-related myelodysplasia. Also little has element of ACD due to her RA. Patient has no overt constitutional symptoms such as fevers/chills/drenching night sweats/weight loss more than 10% of body weight. WBC counts today are normal at 9.7k  No thrombocytopenia Hgb relatively stable at 10.9 Plan. -discussed labs results in details. -patient has no overt clinical or lab findings suggestive of significant progression of her CLL at this time. -No indication for treatment of her CLL at this time. -Continue follow-up with primary care physician for management of her other medical co-morbidities. -we shall recheck her blood counts in 6 months to ensure stability and re-evaluate her  clinically. -patient is aware of concerning symptoms that might suggest evidence of progression of CLL and was recommended to call us with these if present. -Continue follow with rheumatology to address her joint pain issues and rheumatoid arthritis.  Return to care in 6 months with repeat CBC, CMP. Earlier follow-up if any new concerns or questions.  . The total time spent in the appointment was 15 minutes and more than 50% was on counseling and direct patient cares.     Sullivan Lone MD [place] AAHIVMS [facility] Hematology/Oncology Physician [place]  (Office):       [phone] (Work cell):  [phone] (Fax):           [phone]  This document serves as a record of services personally performed by Sullivan Lone, MD. It was created on his behalf by Margit Banda, a trained medical scribe. The creation of this record is based on the scribe's personal observations and the provider's statements to them.   .I have reviewed the above documentation for accuracy and completeness, and I agree with the above. Brunetta Genera MD MS

## 2017-08-15 ENCOUNTER — Encounter: Payer: Self-pay | Admitting: Hematology

## 2017-08-15 ENCOUNTER — Inpatient Hospital Stay (HOSPITAL_BASED_OUTPATIENT_CLINIC_OR_DEPARTMENT_OTHER): Payer: Medicare Other | Admitting: Hematology

## 2017-08-15 ENCOUNTER — Telehealth: Payer: Self-pay | Admitting: Hematology

## 2017-08-15 ENCOUNTER — Inpatient Hospital Stay: Payer: Medicare Other | Attending: Hematology

## 2017-08-15 VITALS — BP 165/64 | HR 80 | Temp 98.1°F | Resp 18 | Ht 63.5 in | Wt 138.4 lb

## 2017-08-15 DIAGNOSIS — M069 Rheumatoid arthritis, unspecified: Secondary | ICD-10-CM | POA: Diagnosis not present

## 2017-08-15 DIAGNOSIS — D649 Anemia, unspecified: Secondary | ICD-10-CM | POA: Insufficient documentation

## 2017-08-15 DIAGNOSIS — C9110 Chronic lymphocytic leukemia of B-cell type not having achieved remission: Secondary | ICD-10-CM | POA: Diagnosis not present

## 2017-08-15 LAB — COMPREHENSIVE METABOLIC PANEL
ALBUMIN: 3.2 g/dL — AB (ref 3.5–5.0)
ALK PHOS: 84 U/L (ref 40–150)
ALT: 16 U/L (ref 0–55)
ANION GAP: 9 (ref 3–11)
AST: 25 U/L (ref 5–34)
BUN: 24 mg/dL (ref 7–26)
CALCIUM: 9.4 mg/dL (ref 8.4–10.4)
CO2: 23 mmol/L (ref 22–29)
CREATININE: 0.96 mg/dL (ref 0.60–1.10)
Chloride: 110 mmol/L — ABNORMAL HIGH (ref 98–109)
GFR calc Af Amer: 59 mL/min — ABNORMAL LOW (ref >60)
GFR calc non Af Amer: 51 mL/min — ABNORMAL LOW (ref >60)
GLUCOSE: 91 mg/dL (ref 70–140)
Potassium: 3.9 mmol/L (ref 3.5–5.1)
SODIUM: 142 mmol/L (ref 136–145)
Total Bilirubin: 0.3 mg/dL (ref 0.2–1.2)
Total Protein: 7.3 g/dL (ref 6.4–8.3)

## 2017-08-15 LAB — RETICULOCYTES
RBC.: 3.47 MIL/uL — AB (ref 3.70–5.45)
Retic Count, Absolute: 38.2 10*3/uL (ref 33.7–90.7)
Retic Ct Pct: 1.1 % (ref 0.7–2.1)

## 2017-08-15 LAB — CBC WITH DIFFERENTIAL/PLATELET
BASOS PCT: 0 %
Basophils Absolute: 0 10*3/uL (ref 0.0–0.1)
Eosinophils Absolute: 0.1 10*3/uL (ref 0.0–0.5)
Eosinophils Relative: 1 %
HEMATOCRIT: 33.4 % — AB (ref 34.8–46.6)
HEMOGLOBIN: 10.9 g/dL — AB (ref 11.6–15.9)
Lymphocytes Relative: 66 %
Lymphs Abs: 6.3 10*3/uL — ABNORMAL HIGH (ref 0.9–3.3)
MCH: 31.4 pg (ref 25.1–34.0)
MCHC: 32.6 g/dL (ref 31.5–36.0)
MCV: 96.3 fL (ref 79.5–101.0)
MONOS PCT: 6 %
Monocytes Absolute: 0.6 10*3/uL (ref 0.1–0.9)
NEUTROS ABS: 2.6 10*3/uL (ref 1.5–6.5)
Neutrophils Relative %: 27 %
Platelets: 203 10*3/uL (ref 145–400)
RBC: 3.47 MIL/uL — ABNORMAL LOW (ref 3.70–5.45)
RDW: 13.4 % (ref 11.2–14.5)
WBC: 9.7 10*3/uL (ref 3.9–10.3)

## 2017-08-15 NOTE — Telephone Encounter (Signed)
Scheduled appt per 2/22 los - Gave pt AVS and calender per los.

## 2017-09-12 DIAGNOSIS — E890 Postprocedural hypothyroidism: Secondary | ICD-10-CM | POA: Diagnosis not present

## 2017-10-15 ENCOUNTER — Encounter (HOSPITAL_COMMUNITY): Payer: Self-pay

## 2017-10-15 ENCOUNTER — Inpatient Hospital Stay (HOSPITAL_COMMUNITY)
Admission: EM | Admit: 2017-10-15 | Discharge: 2017-10-19 | DRG: 418 | Disposition: A | Discharge status: Home Health | Payer: Medicare Other | Attending: Family Medicine | Admitting: Family Medicine

## 2017-10-15 ENCOUNTER — Emergency Department (HOSPITAL_COMMUNITY): Payer: Medicare Other

## 2017-10-15 DIAGNOSIS — K8060 Calculus of gallbladder and bile duct with cholecystitis, unspecified, without obstruction: Secondary | ICD-10-CM | POA: Diagnosis present

## 2017-10-15 DIAGNOSIS — I10 Essential (primary) hypertension: Secondary | ICD-10-CM | POA: Diagnosis present

## 2017-10-15 DIAGNOSIS — K5710 Diverticulosis of small intestine without perforation or abscess without bleeding: Secondary | ICD-10-CM | POA: Diagnosis present

## 2017-10-15 DIAGNOSIS — K828 Other specified diseases of gallbladder: Secondary | ICD-10-CM | POA: Diagnosis present

## 2017-10-15 DIAGNOSIS — K5900 Constipation, unspecified: Secondary | ICD-10-CM | POA: Diagnosis present

## 2017-10-15 DIAGNOSIS — C9110 Chronic lymphocytic leukemia of B-cell type not having achieved remission: Secondary | ICD-10-CM | POA: Diagnosis present

## 2017-10-15 DIAGNOSIS — K219 Gastro-esophageal reflux disease without esophagitis: Secondary | ICD-10-CM | POA: Diagnosis not present

## 2017-10-15 DIAGNOSIS — N179 Acute kidney failure, unspecified: Secondary | ICD-10-CM | POA: Diagnosis not present

## 2017-10-15 DIAGNOSIS — R112 Nausea with vomiting, unspecified: Secondary | ICD-10-CM | POA: Diagnosis not present

## 2017-10-15 DIAGNOSIS — Z85828 Personal history of other malignant neoplasm of skin: Secondary | ICD-10-CM

## 2017-10-15 DIAGNOSIS — K8020 Calculus of gallbladder without cholecystitis without obstruction: Secondary | ICD-10-CM | POA: Diagnosis not present

## 2017-10-15 DIAGNOSIS — E039 Hypothyroidism, unspecified: Secondary | ICD-10-CM | POA: Diagnosis not present

## 2017-10-15 DIAGNOSIS — R079 Chest pain, unspecified: Secondary | ICD-10-CM | POA: Diagnosis not present

## 2017-10-15 DIAGNOSIS — K851 Biliary acute pancreatitis without necrosis or infection: Principal | ICD-10-CM | POA: Diagnosis present

## 2017-10-15 DIAGNOSIS — R110 Nausea: Secondary | ICD-10-CM | POA: Diagnosis not present

## 2017-10-15 DIAGNOSIS — R1013 Epigastric pain: Secondary | ICD-10-CM | POA: Diagnosis not present

## 2017-10-15 DIAGNOSIS — E785 Hyperlipidemia, unspecified: Secondary | ICD-10-CM | POA: Diagnosis present

## 2017-10-15 DIAGNOSIS — R0789 Other chest pain: Secondary | ICD-10-CM | POA: Diagnosis not present

## 2017-10-15 DIAGNOSIS — R0781 Pleurodynia: Secondary | ICD-10-CM

## 2017-10-15 DIAGNOSIS — Z79890 Hormone replacement therapy (postmenopausal): Secondary | ICD-10-CM

## 2017-10-15 DIAGNOSIS — Z87440 Personal history of urinary (tract) infections: Secondary | ICD-10-CM

## 2017-10-15 DIAGNOSIS — K859 Acute pancreatitis without necrosis or infection, unspecified: Secondary | ICD-10-CM

## 2017-10-15 DIAGNOSIS — Z419 Encounter for procedure for purposes other than remedying health state, unspecified: Secondary | ICD-10-CM

## 2017-10-15 DIAGNOSIS — K8050 Calculus of bile duct without cholangitis or cholecystitis without obstruction: Secondary | ICD-10-CM

## 2017-10-15 DIAGNOSIS — Z7982 Long term (current) use of aspirin: Secondary | ICD-10-CM

## 2017-10-15 LAB — COMPREHENSIVE METABOLIC PANEL
ALBUMIN: 3.3 g/dL — AB (ref 3.5–5.0)
ALK PHOS: 75 U/L (ref 38–126)
ALT: 37 U/L (ref 14–54)
ANION GAP: 9 (ref 5–15)
AST: 85 U/L — AB (ref 15–41)
BUN: 19 mg/dL (ref 6–20)
CO2: 22 mmol/L (ref 22–32)
Calcium: 9.4 mg/dL (ref 8.9–10.3)
Chloride: 103 mmol/L (ref 101–111)
Creatinine, Ser: 1.02 mg/dL — ABNORMAL HIGH (ref 0.44–1.00)
GFR calc Af Amer: 55 mL/min — ABNORMAL LOW (ref >60)
GFR calc non Af Amer: 48 mL/min — ABNORMAL LOW (ref >60)
Glucose, Bld: 113 mg/dL — ABNORMAL HIGH (ref 65–99)
POTASSIUM: 4.3 mmol/L (ref 3.5–5.1)
SODIUM: 134 mmol/L — AB (ref 135–145)
Total Bilirubin: 0.5 mg/dL (ref 0.3–1.2)
Total Protein: 7.6 g/dL (ref 6.5–8.1)

## 2017-10-15 LAB — I-STAT TROPONIN, ED: TROPONIN I, POC: 0 ng/mL (ref 0.00–0.08)

## 2017-10-15 LAB — TSH: TSH: 1.925 u[IU]/mL (ref 0.350–4.500)

## 2017-10-15 LAB — CBC
HEMATOCRIT: 33.6 % — AB (ref 36.0–46.0)
HEMOGLOBIN: 11.1 g/dL — AB (ref 12.0–15.0)
MCH: 31.1 pg (ref 26.0–34.0)
MCHC: 33 g/dL (ref 30.0–36.0)
MCV: 94.1 fL (ref 78.0–100.0)
Platelets: 254 10*3/uL (ref 150–400)
RBC: 3.57 MIL/uL — AB (ref 3.87–5.11)
RDW: 13.1 % (ref 11.5–15.5)
WBC: 12.1 10*3/uL — ABNORMAL HIGH (ref 4.0–10.5)

## 2017-10-15 LAB — MAGNESIUM: Magnesium: 1.4 mg/dL — ABNORMAL LOW (ref 1.7–2.4)

## 2017-10-15 LAB — LIPASE, BLOOD: Lipase: 1442 U/L — ABNORMAL HIGH (ref 11–51)

## 2017-10-15 MED ORDER — KETOROLAC TROMETHAMINE 15 MG/ML IJ SOLN
15.0000 mg | Freq: Four times a day (QID) | INTRAMUSCULAR | Status: DC | PRN
Start: 2017-10-15 — End: 2017-10-18
  Administered 2017-10-16 – 2017-10-17 (×2): 15 mg via INTRAVENOUS
  Filled 2017-10-15 (×2): qty 1

## 2017-10-15 MED ORDER — SODIUM CHLORIDE 0.9 % IV BOLUS
500.0000 mL | Freq: Once | INTRAVENOUS | Status: AC
  Administered 2017-10-15: 500 mL via INTRAVENOUS

## 2017-10-15 MED ORDER — ENOXAPARIN SODIUM 40 MG/0.4ML ~~LOC~~ SOLN
40.0000 mg | SUBCUTANEOUS | Status: DC
  Administered 2017-10-15 – 2017-10-16 (×2): 40 mg via SUBCUTANEOUS
  Filled 2017-10-15 (×2): qty 0.4

## 2017-10-15 MED ORDER — ONDANSETRON HCL 4 MG/2ML IJ SOLN
4.0000 mg | Freq: Once | INTRAMUSCULAR | Status: AC
  Administered 2017-10-15: 4 mg via INTRAVENOUS
  Filled 2017-10-15: qty 2

## 2017-10-15 MED ORDER — FAMOTIDINE IN NACL 20-0.9 MG/50ML-% IV SOLN
20.0000 mg | Freq: Two times a day (BID) | INTRAVENOUS | Status: DC
Start: 2017-10-15 — End: 2017-10-18
  Administered 2017-10-15 – 2017-10-18 (×5): 20 mg via INTRAVENOUS
  Filled 2017-10-15 (×5): qty 50

## 2017-10-15 MED ORDER — FENTANYL CITRATE (PF) 100 MCG/2ML IJ SOLN
50.0000 ug | Freq: Once | INTRAMUSCULAR | Status: AC
  Administered 2017-10-15: 50 ug via INTRAVENOUS
  Filled 2017-10-15: qty 2

## 2017-10-15 MED ORDER — SENNOSIDES-DOCUSATE SODIUM 8.6-50 MG PO TABS
1.0000 | ORAL_TABLET | Freq: Every evening | ORAL | Status: DC | PRN
  Filled 2017-10-15: qty 1

## 2017-10-15 MED ORDER — SODIUM CHLORIDE 0.9 % IV SOLN
INTRAVENOUS | Status: DC
  Administered 2017-10-15: 20:00:00 via INTRAVENOUS

## 2017-10-15 MED ORDER — ONDANSETRON HCL 4 MG/2ML IJ SOLN
4.0000 mg | Freq: Four times a day (QID) | INTRAMUSCULAR | Status: DC | PRN
  Administered 2017-10-16: 4 mg via INTRAVENOUS
  Filled 2017-10-15: qty 2

## 2017-10-15 MED ORDER — HYDRALAZINE HCL 20 MG/ML IJ SOLN: 10.0000 mg | Freq: Four times a day (QID) | INTRAMUSCULAR | Status: DC | PRN

## 2017-10-15 MED ORDER — ONDANSETRON HCL 4 MG PO TABS: 4.0000 mg | ORAL_TABLET | Freq: Four times a day (QID) | ORAL | Status: DC | PRN

## 2017-10-15 NOTE — ED Notes (Signed)
Patient transported to Ultrasound 

## 2017-10-15 NOTE — ED Provider Notes (Signed)
[facility] EMERGENCY DEPARTMENT Provider Note   CSN: [id number] Arrival date & time: 10/15/17  1311     History   Chief Complaint Chief Complaint  Patient presents with  . Chest Pain    HPI Donna Burch is a 84 y.o. female.  HPI Patient presents with epigastric to chest pain that started this morning.  Has had a decreased appetite for the last few months.  Today even more decreased.  Has nausea without vomiting.  No diarrhea.  Pain is dull.  In her epigastric to left abdomen.  Has not really had pain over the last months however.  Has not lost weight.  No dysuria.  No fevers or chills.  Does not drink alcohol.  States she has bad arthritis. Past Medical History:  Diagnosis Date  . Arthritis    "right middle finger; hands" (01/03/2015)  . CLL (chronic lymphocytic leukemia) ([place])   . Dyspnea on exertion    Improved with exercise  . Elevated brain natriuretic peptide (BNP) level    Slight  . Family history of adverse reaction to anesthesia    son, Marita Snellen, w/PONV  . Frequent UTI    "used to; none lately" (01/03/2015)  . Gastroenteritis 09/2010   c. dificile.  hospitalized in ICU x 3 days  . GERD (gastroesophageal reflux disease)   . Hiatal hernia   . History of Clostridium difficile ?03/2012   "after taking ATB for one of my UTI's"  . Hyperlipidemia   . Hypertension   . Hypothyroidism   . Nephrolithiasis   . Nocturia    x3  . Pneumonia    Required hospital stay at the time  . PONV (postoperative nausea and vomiting)   . Renal insufficiency   . Seasonal allergies   . Skin cancer    right anterior neck  . Wears glasses     Patient Active Problem List   Diagnosis Date Noted  . Pancreatitis 10/15/2017  . Inflammatory arthritis 05/29/2016  . Pain in joint involving multiple sites 05/29/2016  . Screening-pulmonary TB 05/29/2016  . High risk medication use 04/25/2016  . Bradycardia   . Symptomatic bradycardia   . Junctional escape rhythm   .  Orthostatic hypotension   . Thyroid activity decreased   . Hypomagnesemia   . Syncope 09/01/2015  . Nausea & vomiting 09/01/2015  . Hyponatremia 09/01/2015  . Cough 09/01/2015  . Hypertension   . Hyperlipidemia   . GERD (gastroesophageal reflux disease)   . Hypothyroidism   . Gastroesophageal reflux disease without esophagitis   . CLL (chronic lymphocytic leukemia) ([place]) 07/14/2015  . Thyroid nodule, right 01/03/2015  . Thyroid nodule 01/03/2015  . Pelvic relaxation 04/08/2012  . Kidney stones 04/08/2012  . Recurrent UTI (urinary tract infection) 04/08/2012    Past Surgical History:  Procedure Laterality Date  . CATARACT EXTRACTION, BILATERAL  2010  . COLONOSCOPY    . CYSTOSCOPY W/ STONE MANIPULATION Right   . CYSTOSCOPY WITH STENT PLACEMENT    . ESOPHAGOGASTRODUODENOSCOPY    . ESOPHAGOGASTRODUODENOSCOPY (EGD) WITH ESOPHAGEAL DILATION    . KNEE ARTHROSCOPY Left   . LITHOTRIPSY     "didn't work"  . SKIN CANCER EXCISION Right    anterior neck  . THYROID LOBECTOMY Right 01/03/2015  . THYROID LOBECTOMY Right 01/03/2015   Procedure: RIGHT THYROID LOBECTOMY;  Surgeon: Armandina Gemma, MD;  Location: [facility];  Service: General;  Laterality: Right;  . TONSILLECTOMY    . WISDOM TOOTH EXTRACTION  OB History    Gravida  3   Para  3   Term      Preterm      AB      Living  3     SAB      TAB      Ectopic      Multiple      Live Births               Home Medications    Prior to Admission medications   Medication Sig Start Date End Date Taking? Authorizing Provider  aspirin 81 MG tablet Take 81 mg by mouth daily.    [provider]  B Complex-C (SUPER B COMPLEX PO) Take by mouth daily.    [provider]  cholecalciferol (VITAMIN D) 1000 UNITS tablet Take 2,000 Units by mouth daily.     [provider]  feeding supplement, ENSURE ENLIVE, (ENSURE ENLIVE) LIQD Take 237 mLs by mouth daily. Patient not taking: Reported on 08/15/2017  09/06/15   Regalado, Jerald Kief A, MD  ferrous sulfate 325 (65 FE) MG EC tablet Take 325 mg by mouth daily with breakfast.    [provider]  losartan (COZAAR) 100 MG tablet Take 100 mg by mouth daily.  12/18/16   [provider]  magnesium chloride (SLOW-MAG) 64 MG TBEC SR tablet Take 1 tablet by mouth 2 (two) times daily.     [provider]  Multiple Vitamins-Minerals (MULTIVITAMIN ADULTS PO) Take by mouth daily.    [provider]  Nutritional Supplements (COLON FORMULA PO) Take 1 capsule by mouth daily.     [provider]  omeprazole (PRILOSEC) 20 MG capsule Take 20 mg by mouth daily.    [provider]    Family History Family History  Problem Relation Age of Onset  . Heart attack Mother   . Hyperlipidemia Mother   . Asthma Father   . Diabetes Father     Social History Social History   Tobacco Use  . Smoking status: Never Smoker  . Smokeless tobacco: Never Used  Substance Use Topics  . Alcohol use: No  . Drug use: No     Allergies   Prednisone and Reclast [zoledronic acid]   Review of Systems Review of Systems  Constitutional: Positive for appetite change. Negative for unexpected weight change.  HENT: Negative for congestion.   Respiratory: Negative for shortness of breath.   Cardiovascular: Positive for chest pain.  Gastrointestinal: Positive for abdominal pain and nausea. Negative for vomiting.  Genitourinary: Negative for dysuria and flank pain.  Musculoskeletal: Negative for back pain.  Skin: Negative for rash.  Neurological: Negative for numbness.  Hematological: Negative for adenopathy.  Psychiatric/Behavioral: Negative for confusion.     Physical Exam Updated Vital Signs BP (!) 189/77 (BP Location: Right Arm)   Pulse 91   Temp (!) 97.5 F (36.4 C) (Oral)   Resp (!) 24   SpO2 100%   Physical Exam  Constitutional: She appears well-developed.  HENT:  Head: Atraumatic.  Neck: Neck supple.    Cardiovascular: Regular rhythm.  Pulmonary/Chest: Effort normal and breath sounds normal.  Abdominal: There is tenderness.  Epigastric to left upper quadrant without rebound or guarding.  Musculoskeletal:       Right lower leg: She exhibits no edema.       Left lower leg: She exhibits no edema.  Neurological: She is alert.  Skin: Skin is warm. Capillary refill takes less than 2 seconds.  ED Treatments / Results  Labs (all labs ordered are listed, but only abnormal results are displayed) Labs Reviewed  CBC - Abnormal; Notable for the following components:      Result Value   WBC 12.1 (*)    RBC 3.57 (*)    Hemoglobin 11.1 (*)    HCT 33.6 (*)    All other components within normal limits  LIPASE, BLOOD - Abnormal; Notable for the following components:   Lipase 1,442 (*)    All other components within normal limits  COMPREHENSIVE METABOLIC PANEL - Abnormal; Notable for the following components:   Sodium 134 (*)    Glucose, Bld 113 (*)    Creatinine, Ser 1.02 (*)    Albumin 3.3 (*)    AST 85 (*)    GFR calc non Af Amer 48 (*)    GFR calc Af Amer 55 (*)    All other components within normal limits  I-STAT TROPONIN, ED    EKG EKG Interpretation  Date/Time:  Wednesday October 15 2017 13:16:40 EDT Ventricular Rate:  92 PR Interval:  154 QRS Duration: 76 QT Interval:  348 QTC Calculation: 430 R Axis:   37 Text Interpretation:  Normal sinus rhythm Normal ECG No significant change since last tracing Confirmed by Duffy Bruce [id number]) on 10/15/2017 3:18:22 PM   Radiology Dg Chest 2 View  Result Date: 10/15/2017 CLINICAL DATA:  Chest pain EXAM: CHEST - 2 VIEW COMPARISON:  01/30/2017 FINDINGS: Mild elevation of left diaphragm. No focal airspace disease or pleural effusion. Stable cardiomediastinal silhouette with aortic atherosclerosis. No pneumothorax. Mild degenerative changes of the spine. IMPRESSION: No active cardiopulmonary disease. Mild elevation of the left  diaphragm. Electronically Signed   By: Donavan Foil M.D.   On: 10/15/2017 14:36    Procedures Procedures (including critical care time)  Medications Ordered in ED Medications  sodium chloride 0.9 % bolus 500 mL (500 mLs Intravenous New Bag/Given 10/15/17 1650)  hydrALAZINE (APRESOLINE) injection 10 mg (has no administration in time range)  famotidine (PEPCID) IVPB 20 mg premix (has no administration in time range)  ondansetron (ZOFRAN) injection 4 mg (4 mg Intravenous Given 10/15/17 1650)  fentaNYL (SUBLIMAZE) injection 50 mcg (50 mcg Intravenous Given 10/15/17 1650)     Initial Impression / Assessment and Plan / ED Course  I have reviewed the triage vital signs and the nursing notes.  Pertinent labs & imaging results that were available during my care of the patient were reviewed by me and considered in my medical decision making (see chart for details).     Patient with abdominal pain and nausea.  Has had weight loss.  Has elevated lipase consistent with pancreatitis.  Has known gallstones.  However does not really have lab work showing cholecystitis or obstruction.  No weight loss.  With age and comorbidities will admit to hospitalist.  Ultrasound pending.  Final Clinical Impressions(s) / ED Diagnoses   Final diagnoses:  Acute pancreatitis, unspecified complication status, unspecified pancreatitis type    ED Discharge Orders    None       Davonna Belling, MD 10/15/17 1701

## 2017-10-15 NOTE — H&P (Signed)
History and Physical    Donna Burch KVQ:[id number] DOB: [birth year] DOA: 10/15/2017  Referring MD/NP/PA: er PCP: Harlan Stains, MD Outpatient Specialists: [facility]) [place]  Patient coming from: home  Chief Complaint: abdominal pain  HPI: Donna Burch is a 84 y.o. female with medical history significant of CLL, arthritis, orthostatic hypotension, and kidney stones comes in with a few hour history of abdominal pain.  She was in normal health yesterday.  Today while sitting on the couch, she developed sharp epigastric abdominal pain.  +belching, + nausea but no vomiting. Had a normal bowel movement today.  She has known gallstones.  Has seen [facility], most recently in 2018 but not sure what was recommended.  Denies new medications, denies alcohol.  No jaudice.  Does not think she drinks enough water  From May 2017: d/c summary Nausea and vomiting: Unclear etiology. Reported vomited x1 on Thursday, denies acid reflux symptom, denies dysphagia, she reported she coughed too much, possibly cough induced, Patient does not have abdominal pain, could also be due to UTI. -EGD 11/2014; 8 cm hiatal hernia. No distal esophageal stricture. 2. Intermittent tertiary contractions in the distal esophagus  -Lipase elevated (70s)? curently no n/v, no ab pain, tolerating regular diet, repeat lipase remain mildly elevated, abdominal US reveal gallstones, passing gallstones? Need outpatient gi follow up.  ED Course: Labs drawn that showed a markedly elevated lipase.  Lfts and bilirubin normal.  Patient then sent to U/S and hospitalists were asked to place in observation  Review of Systems: all systems reviewed, negative unless stated above in HPI   Past Medical History:  Diagnosis Date  . Arthritis    "right middle finger; hands" (01/03/2015)  . CLL (chronic lymphocytic leukemia) ([place])   . Dyspnea on exertion    Improved with exercise  . Elevated brain natriuretic peptide (BNP) level    Slight   . Family history of adverse reaction to anesthesia    son, Marita Snellen, w/PONV  . Frequent UTI    "used to; none lately" (01/03/2015)  . Gastroenteritis 09/2010   c. dificile.  hospitalized in ICU x 3 days  . GERD (gastroesophageal reflux disease)   . Hiatal hernia   . History of Clostridium difficile ?03/2012   "after taking ATB for one of my UTI's"  . Hyperlipidemia   . Hypertension   . Hypothyroidism   . Nephrolithiasis   . Nocturia    x3  . Pneumonia    Required hospital stay at the time  . PONV (postoperative nausea and vomiting)   . Renal insufficiency   . Seasonal allergies   . Skin cancer    right anterior neck  . Wears glasses     Past Surgical History:  Procedure Laterality Date  . CATARACT EXTRACTION, BILATERAL  2010  . COLONOSCOPY    . CYSTOSCOPY W/ STONE MANIPULATION Right   . CYSTOSCOPY WITH STENT PLACEMENT    . ESOPHAGOGASTRODUODENOSCOPY    . ESOPHAGOGASTRODUODENOSCOPY (EGD) WITH ESOPHAGEAL DILATION    . KNEE ARTHROSCOPY Left   . LITHOTRIPSY     "didn't work"  . SKIN CANCER EXCISION Right    anterior neck  . THYROID LOBECTOMY Right 01/03/2015  . THYROID LOBECTOMY Right 01/03/2015   Procedure: RIGHT THYROID LOBECTOMY;  Surgeon: Armandina Gemma, MD;  Location: [facility];  Service: General;  Laterality: Right;  . TONSILLECTOMY    . WISDOM TOOTH EXTRACTION       reports that she has never smoked. She has never used  smokeless tobacco. She reports that she does not drink alcohol or use drugs.  Allergies  Allergen Reactions  . Prednisone Other (See Comments)    "Jittery"  . Reclast [Zoledronic Acid] Other (See Comments)    Pt did not have a memory, from the day after she took that medication     Family History  Problem Relation Age of Onset  . Heart attack Mother   . Hyperlipidemia Mother   . Asthma Father   . Diabetes Father      Prior to Admission medications   Medication Sig Start Date End Date Taking? Authorizing Provider  aspirin 81 MG tablet Take 81 mg  by mouth daily.    [provider]  B Complex-C (SUPER B COMPLEX PO) Take by mouth daily.    [provider]  cholecalciferol (VITAMIN D) 1000 UNITS tablet Take 2,000 Units by mouth daily.     [provider]  feeding supplement, ENSURE ENLIVE, (ENSURE ENLIVE) LIQD Take 237 mLs by mouth daily. Patient not taking: Reported on 08/15/2017 09/06/15   Regalado, Jerald Kief A, MD  ferrous sulfate 325 (65 FE) MG EC tablet Take 325 mg by mouth daily with breakfast.    [provider]  losartan (COZAAR) 100 MG tablet Take 100 mg by mouth daily.  12/18/16   [provider]  magnesium chloride (SLOW-MAG) 64 MG TBEC SR tablet Take 1 tablet by mouth 2 (two) times daily.     [provider]  Multiple Vitamins-Minerals (MULTIVITAMIN ADULTS PO) Take by mouth daily.    [provider]  Nutritional Supplements (COLON FORMULA PO) Take 1 capsule by mouth daily.     [provider]  omeprazole (PRILOSEC) 20 MG capsule Take 20 mg by mouth daily.    [provider]    Physical Exam: Vitals:   10/15/17 1316 10/15/17 1617  BP: (!) 198/85 (!) 189/77  Pulse: 99 91  Resp: 18 (!) 24  Temp: (!) 97.5 F (36.4 C)   TempSrc: Oral   SpO2: 100% 100%      Constitutional: NAD, calm, comfortable- talkative Vitals:   10/15/17 1316 10/15/17 1617  BP: (!) 198/85 (!) 189/77  Pulse: 99 91  Resp: 18 (!) 24  Temp: (!) 97.5 F (36.4 C)   TempSrc: Oral   SpO2: 100% 100%   Eyes: PERRL, lids and conjunctivae normal ENMT: Mucous membranes are dry. Neck: normal, supple, no masses, no thyromegaly Respiratory: clear to auscultation bilaterally, no wheezing, no crackles. Normal respiratory effort. No accessory muscle use.  Cardiovascular: Regular rate and rhythm, no murmurs / rubs / gallops. No extremity edema. 2+ pedal pulses. No carotid bruits.  Abdomen: tender in the epigastric region, mildly tender in RUQ--- no rebounding or gaurding    Musculoskeletal: no clubbing / cyanosis. No joint deformity upper and lower extremities. Good ROM, no contractures. Normal muscle tone.  Skin: no rashes, lesions, ulcers. No induration Neurologic: CN 2-12 grossly intact. Sensation intact, DTR normal. Strength 5/5 in all 4.  Psychiatric: Normal judgment and insight. Alert and oriented x 3. Normal mood.    Labs on Admission: I have personally reviewed following labs and imaging studies  CBC: Recent Labs  Lab 10/15/17 1342  WBC 12.1*  HGB 11.1*  HCT 33.6*  MCV 94.1  PLT 810   Basic Metabolic Panel: Recent Labs  Lab 10/15/17 1342  NA 134*  K 4.3  CL 103  CO2 22  GLUCOSE 113*  BUN 19  CREATININE 1.02*  CALCIUM 9.4  GFR: CrCl cannot be calculated (Unknown ideal weight.). Liver Function Tests: Recent Labs  Lab 10/15/17 1342  AST 85*  ALT 37  ALKPHOS 75  BILITOT 0.5  PROT 7.6  ALBUMIN 3.3*   Recent Labs  Lab 10/15/17 1342  LIPASE 1,442*   No results for input(s): AMMONIA in the last 168 hours. Coagulation Profile: No results for input(s): INR, PROTIME in the last 168 hours. Cardiac Enzymes: No results for input(s): CKTOTAL, CKMB, CKMBINDEX, TROPONINI in the last 168 hours. BNP (last 3 results) No results for input(s): PROBNP in the last 8760 hours. HbA1C: No results for input(s): HGBA1C in the last 72 hours. CBG: No results for input(s): GLUCAP in the last 168 hours. Lipid Profile: No results for input(s): CHOL, HDL, LDLCALC, TRIG, CHOLHDL, LDLDIRECT in the last 72 hours. Thyroid Function Tests: No results for input(s): TSH, T4TOTAL, FREET4, T3FREE, THYROIDAB in the last 72 hours. Anemia Panel: No results for input(s): VITAMINB12, FOLATE, FERRITIN, TIBC, IRON, RETICCTPCT in the last 72 hours. Urine analysis:    Component Value Date/Time   COLORURINE YELLOW 09/01/2015 2201   APPEARANCEUR CLEAR 09/01/2015 2201   LABSPEC 1.011 09/01/2015 2201   PHURINE 7.0 09/01/2015 2201   GLUCOSEU NEGATIVE 09/01/2015  2201   HGBUR NEGATIVE 09/01/2015 2201   BILIRUBINUR NEGATIVE 09/01/2015 2201   BILIRUBINUR neg 04/08/2012 1552   KETONESUR NEGATIVE 09/01/2015 2201   PROTEINUR NEGATIVE 09/01/2015 2201   UROBILINOGEN negative 04/08/2012 1552   UROBILINOGEN 0.2 10/06/2010 2006   NITRITE NEGATIVE 09/01/2015 2201   LEUKOCYTESUR MODERATE (A) 09/01/2015 2201   Sepsis Labs: Invalid input(s): PROCALCITONIN, LACTICIDVEN No results found for this or any previous visit (from the past 240 hour(s)).   Radiological Exams on Admission: Dg Chest 2 View  Result Date: 10/15/2017 CLINICAL DATA:  Chest pain EXAM: CHEST - 2 VIEW COMPARISON:  01/30/2017 FINDINGS: Mild elevation of left diaphragm. No focal airspace disease or pleural effusion. Stable cardiomediastinal silhouette with aortic atherosclerosis. No pneumothorax. Mild degenerative changes of the spine. IMPRESSION: No active cardiopulmonary disease. Mild elevation of the left diaphragm. Electronically Signed   By: Donavan Foil M.D.   On: 10/15/2017 14:36      Assessment/Plan Active Problems:   CLL (chronic lymphocytic leukemia) ([facility])   Hypertension   GERD (gastroesophageal reflux disease)   Hypothyroidism   Pancreatitis   Elevated lipase/pancreatitis >1000 -? etilology -denies alcohol, known gall stone -U/S of RUQ pending -may need to touch base/consult [facility] --- has seen in the past for gallstones -IVF/pain control/ repeat AM labs/NPO except sips/chips  HTN -IV until able to take PO  CLL -stable -follows with [facility]  Hypothyroidism -not on meds, follow with Dr. Buddy Duty -TSH  H/o hypomagnesemia -check level   DVT prophylaxis: lovenox Code Status: full Family Communication: at bedside Disposition Plan: home 24-48 hours pending improvements Consults called: none Admission status: obs/med surge   Geradine Girt DO [facility] Pager [phone]  If 7PM-7AM, please contact night-coverage [email or web address] Password  [facility]  10/15/2017, 5:13 PM

## 2017-10-15 NOTE — ED Triage Notes (Signed)
Pt presents for evaluation of epigastric, L sided chest pain starting this AM. Pt reports all shes had to eat today is cereal, reports feels like she has heartburn, nausea. Denies vomiting.

## 2017-10-16 ENCOUNTER — Other Ambulatory Visit: Payer: Self-pay

## 2017-10-16 ENCOUNTER — Encounter (HOSPITAL_COMMUNITY): Payer: Self-pay | Admitting: General Practice

## 2017-10-16 DIAGNOSIS — Z87440 Personal history of urinary (tract) infections: Secondary | ICD-10-CM | POA: Diagnosis not present

## 2017-10-16 DIAGNOSIS — Z85828 Personal history of other malignant neoplasm of skin: Secondary | ICD-10-CM | POA: Diagnosis not present

## 2017-10-16 DIAGNOSIS — K828 Other specified diseases of gallbladder: Secondary | ICD-10-CM | POA: Diagnosis present

## 2017-10-16 DIAGNOSIS — N179 Acute kidney failure, unspecified: Secondary | ICD-10-CM | POA: Diagnosis present

## 2017-10-16 DIAGNOSIS — K851 Biliary acute pancreatitis without necrosis or infection: Secondary | ICD-10-CM | POA: Diagnosis present

## 2017-10-16 DIAGNOSIS — K8010 Calculus of gallbladder with chronic cholecystitis without obstruction: Secondary | ICD-10-CM | POA: Diagnosis not present

## 2017-10-16 DIAGNOSIS — Z79890 Hormone replacement therapy (postmenopausal): Secondary | ICD-10-CM | POA: Diagnosis not present

## 2017-10-16 DIAGNOSIS — K859 Acute pancreatitis without necrosis or infection, unspecified: Secondary | ICD-10-CM | POA: Diagnosis not present

## 2017-10-16 DIAGNOSIS — K5710 Diverticulosis of small intestine without perforation or abscess without bleeding: Secondary | ICD-10-CM | POA: Diagnosis present

## 2017-10-16 DIAGNOSIS — K219 Gastro-esophageal reflux disease without esophagitis: Secondary | ICD-10-CM | POA: Diagnosis present

## 2017-10-16 DIAGNOSIS — K8050 Calculus of bile duct without cholangitis or cholecystitis without obstruction: Secondary | ICD-10-CM | POA: Diagnosis not present

## 2017-10-16 DIAGNOSIS — K8012 Calculus of gallbladder with acute and chronic cholecystitis without obstruction: Secondary | ICD-10-CM | POA: Diagnosis not present

## 2017-10-16 DIAGNOSIS — E039 Hypothyroidism, unspecified: Secondary | ICD-10-CM | POA: Diagnosis present

## 2017-10-16 DIAGNOSIS — R0781 Pleurodynia: Secondary | ICD-10-CM | POA: Diagnosis present

## 2017-10-16 DIAGNOSIS — K5900 Constipation, unspecified: Secondary | ICD-10-CM | POA: Diagnosis present

## 2017-10-16 DIAGNOSIS — R112 Nausea with vomiting, unspecified: Secondary | ICD-10-CM | POA: Diagnosis not present

## 2017-10-16 DIAGNOSIS — J9811 Atelectasis: Secondary | ICD-10-CM | POA: Diagnosis not present

## 2017-10-16 DIAGNOSIS — R740 Nonspecific elevation of levels of transaminase and lactic acid dehydrogenase [LDH]: Secondary | ICD-10-CM | POA: Diagnosis not present

## 2017-10-16 DIAGNOSIS — J189 Pneumonia, unspecified organism: Secondary | ICD-10-CM | POA: Diagnosis not present

## 2017-10-16 DIAGNOSIS — R932 Abnormal findings on diagnostic imaging of liver and biliary tract: Secondary | ICD-10-CM | POA: Diagnosis not present

## 2017-10-16 DIAGNOSIS — E785 Hyperlipidemia, unspecified: Secondary | ICD-10-CM | POA: Diagnosis present

## 2017-10-16 DIAGNOSIS — Z7982 Long term (current) use of aspirin: Secondary | ICD-10-CM | POA: Diagnosis not present

## 2017-10-16 DIAGNOSIS — K8060 Calculus of gallbladder and bile duct with cholecystitis, unspecified, without obstruction: Secondary | ICD-10-CM | POA: Diagnosis present

## 2017-10-16 DIAGNOSIS — C9110 Chronic lymphocytic leukemia of B-cell type not having achieved remission: Secondary | ICD-10-CM | POA: Diagnosis present

## 2017-10-16 DIAGNOSIS — I10 Essential (primary) hypertension: Secondary | ICD-10-CM | POA: Diagnosis present

## 2017-10-16 LAB — MAGNESIUM: Magnesium: 1.3 mg/dL — ABNORMAL LOW (ref 1.7–2.4)

## 2017-10-16 LAB — URINALYSIS, ROUTINE W REFLEX MICROSCOPIC
BACTERIA UA: NONE SEEN
BILIRUBIN URINE: NEGATIVE
Glucose, UA: NEGATIVE mg/dL
Hgb urine dipstick: NEGATIVE
KETONES UR: NEGATIVE mg/dL
NITRITE: NEGATIVE
Protein, ur: NEGATIVE mg/dL
SPECIFIC GRAVITY, URINE: 1.005 (ref 1.005–1.030)
pH: 6 (ref 5.0–8.0)

## 2017-10-16 LAB — COMPREHENSIVE METABOLIC PANEL
ALK PHOS: 75 U/L (ref 38–126)
ALT: 52 U/L (ref 14–54)
ANION GAP: 8 (ref 5–15)
AST: 64 U/L — ABNORMAL HIGH (ref 15–41)
Albumin: 2.9 g/dL — ABNORMAL LOW (ref 3.5–5.0)
BUN: 14 mg/dL (ref 6–20)
CALCIUM: 8.9 mg/dL (ref 8.9–10.3)
CO2: 22 mmol/L (ref 22–32)
Chloride: 109 mmol/L (ref 101–111)
Creatinine, Ser: 0.92 mg/dL (ref 0.44–1.00)
GFR calc Af Amer: >60 mL/min (ref >60)
GFR calc non Af Amer: 54 mL/min — ABNORMAL LOW (ref >60)
Glucose, Bld: 80 mg/dL (ref 65–99)
POTASSIUM: 4 mmol/L (ref 3.5–5.1)
SODIUM: 139 mmol/L (ref 135–145)
Total Bilirubin: 0.7 mg/dL (ref 0.3–1.2)
Total Protein: 6.8 g/dL (ref 6.5–8.1)

## 2017-10-16 LAB — CBC
HEMATOCRIT: 32.4 % — AB (ref 36.0–46.0)
HEMOGLOBIN: 10.5 g/dL — AB (ref 12.0–15.0)
MCH: 30.5 pg (ref 26.0–34.0)
MCHC: 32.4 g/dL (ref 30.0–36.0)
MCV: 94.2 fL (ref 78.0–100.0)
Platelets: 230 10*3/uL (ref 150–400)
RBC: 3.44 MIL/uL — AB (ref 3.87–5.11)
RDW: 13.3 % (ref 11.5–15.5)
WBC: 8.8 10*3/uL (ref 4.0–10.5)

## 2017-10-16 LAB — LIPASE, BLOOD: Lipase: 140 U/L — ABNORMAL HIGH (ref 11–51)

## 2017-10-16 LAB — LIPID PANEL
CHOLESTEROL: 181 mg/dL (ref 0–200)
HDL: 47 mg/dL (ref >40)
LDL Cholesterol: 123 mg/dL — ABNORMAL HIGH (ref 0–99)
TRIGLYCERIDES: 53 mg/dL (ref <150)
Total CHOL/HDL Ratio: 3.9 RATIO
VLDL: 11 mg/dL (ref 0–40)

## 2017-10-16 LAB — SURGICAL PCR SCREEN
MRSA, PCR: NEGATIVE
STAPHYLOCOCCUS AUREUS: NEGATIVE

## 2017-10-16 MED ORDER — MAGNESIUM SULFATE 4 GM/100ML IV SOLN
4.0000 g | Freq: Once | INTRAVENOUS | Status: AC
  Administered 2017-10-16: 4 g via INTRAVENOUS
  Filled 2017-10-16: qty 100

## 2017-10-16 MED ORDER — CEFAZOLIN SODIUM-DEXTROSE 2-4 GM/100ML-% IV SOLN
2.0000 g | INTRAVENOUS | Status: DC
  Filled 2017-10-16 (×2): qty 100

## 2017-10-16 MED ORDER — LACTATED RINGERS IV SOLN
INTRAVENOUS | Status: AC
  Administered 2017-10-16 – 2017-10-17 (×2): via INTRAVENOUS

## 2017-10-16 MED ORDER — LOSARTAN POTASSIUM 50 MG PO TABS
100.0000 mg | ORAL_TABLET | Freq: Every day | ORAL | Status: DC
  Administered 2017-10-16 – 2017-10-18 (×2): 100 mg via ORAL
  Filled 2017-10-16 (×2): qty 2

## 2017-10-16 MED ORDER — MAGNESIUM CHLORIDE 64 MG PO TBEC
1.0000 | DELAYED_RELEASE_TABLET | Freq: Two times a day (BID) | ORAL | Status: DC
  Administered 2017-10-16 – 2017-10-19 (×5): 64 mg via ORAL
  Filled 2017-10-16 (×6): qty 1

## 2017-10-16 NOTE — Consult Note (Signed)
Donna Burch  Donna Burch May 07, 1927  [id number].    Requesting MD: Dr. Florene Glen Chief Complaint/Reason for Consult: gallstone pancreatitis  HPI:   Patient is an 84 year old female with a history of CLL, arthritis, kidney stones who was admitted yesterday for abdominal pain. Patient states she was sitting on her porch and she developed sudden onset sharp epigastric abdominal pain that did not resolve. Pain did not radiate, was severe, constant. Prompted her to come to the ER. Associated belching and nausea but no vomiting. No other associated symptoms. She doesn't remember having pain like this before. Patient currently denies having any pain. She is tolerating a clear liquid diet at this time. Son and daughter-in-law at bedside. Patient is agreeable to surgery. Discussed the pathophysiology and chance for recurrence of gallstone pancreatitis. Patient denies previous abdominall surgeries. Patient takes an aspirin daily but no other anticoagulation.  Ultrasound showed small gallstones and gallbladder sludge. Negative for acute cholecystitis. Lipase 140, WBC WNL  ROS:  Review of Systems  Constitutional: Negative for chills, diaphoresis and fever.  HENT: Negative for sore throat.   Respiratory: Negative for cough and shortness of breath.   Cardiovascular: Negative for chest pain.  Gastrointestinal: Positive for abdominal pain and nausea. Negative for blood in stool, constipation, diarrhea and vomiting.  Genitourinary: Negative for dysuria.  Skin: Negative for rash.  Neurological: Negative for dizziness and loss of consciousness.  All other systems reviewed and are negative.    Family History  Problem Relation Age of Onset  . Heart attack Mother   . Hyperlipidemia Mother   . Asthma Father   . Diabetes Father     Past Medical History:  Diagnosis Date  . Arthritis    "right middle finger; hands" (01/03/2015)  . CLL (chronic lymphocytic leukemia)  ([facility])   . Dyspnea on exertion    Improved with exercise  . Elevated brain natriuretic peptide (BNP) level    Slight  . Family history of adverse reaction to anesthesia    son, Marita Snellen, w/PONV  . Frequent UTI    "used to; none lately" (01/03/2015)  . Gastroenteritis 09/2010   c. dificile.  hospitalized in ICU x 3 days  . GERD (gastroesophageal reflux disease)   . Hiatal hernia   . History of Clostridium difficile ?03/2012   "after taking ATB for one of my UTI's"  . Hyperlipidemia   . Hypertension   . Hypothyroidism   . Nephrolithiasis   . Nocturia    x3  . Pneumonia    Required hospital stay at the time  . PONV (postoperative nausea and vomiting)   . Renal insufficiency   . Seasonal allergies   . Skin cancer    right anterior neck  . Wears glasses     Past Surgical History:  Procedure Laterality Date  . CATARACT EXTRACTION, BILATERAL  2010  . COLONOSCOPY    . CYSTOSCOPY W/ STONE MANIPULATION Right   . CYSTOSCOPY WITH STENT PLACEMENT    . ESOPHAGOGASTRODUODENOSCOPY    . ESOPHAGOGASTRODUODENOSCOPY (EGD) WITH ESOPHAGEAL DILATION    . KNEE ARTHROSCOPY Left   . LITHOTRIPSY     "didn't work"  . SKIN CANCER EXCISION Right    anterior neck  . THYROID LOBECTOMY Right 01/03/2015  . THYROID LOBECTOMY Right 01/03/2015   Procedure: RIGHT THYROID LOBECTOMY;  Surgeon: Armandina Gemma, MD;  Location: [facility];  Service: General;  Laterality: Right;  . TONSILLECTOMY    . [place] EXTRACTION      Social  History:  reports that she has never smoked. She has never used smokeless tobacco. She reports that she does not drink alcohol or use drugs.  Allergies:  Allergies  Allergen Reactions  . Prednisone Other (See Comments)    "Jittery"  . Reclast [Zoledronic Acid] Other (See Comments)    Pt did not have a memory, from the day after she took that medication   . Hydrochlorothiazide Other (See Comments)  . Lisinopril Cough    Medications Prior to Admission  Medication Sig Dispense Refill   . aspirin 81 MG chewable tablet Take 81 mg by mouth daily.    . B Complex-C (SUPER B COMPLEX PO) Take by mouth daily.    . cholecalciferol (VITAMIN D) 1000 UNITS tablet Take 2,000 Units by mouth daily.     . ferrous sulfate 325 (65 FE) MG EC tablet Take 325 mg by mouth daily with breakfast.    . levothyroxine (SYNTHROID, LEVOTHROID) 75 MCG tablet Take 75 mcg by mouth daily.    [name] losartan (COZAAR) 100 MG tablet Take 100 mg by mouth daily.     . magnesium chloride (SLOW-MAG) 64 MG TBEC SR tablet Take 1 tablet by mouth 2 (two) times daily.     . Multiple Vitamins-Minerals (MULTIVITAMIN ADULTS PO) Take by mouth daily.    . Nutritional Supplements (COLON FORMULA PO) Take 1 capsule by mouth daily.     [name] omeprazole (PRILOSEC) 40 MG capsule Take 40 mg by mouth daily.       Blood pressure (!) 163/76, pulse (!) 57, temperature 97.8 F (36.6 C), temperature source Oral, resp. rate 17, height _0  (1.6 m), weight 64.7 kg (142 lb 10.2 oz), SpO2 98 %.  Physical Exam  Constitutional: She is oriented to person, place, and time. She appears well-developed and well-nourished.  Non-toxic appearance. She does not appear ill. No distress.  HENT:  Head: Normocephalic and atraumatic.  Nose: Nose normal.  Mouth/Throat: Uvula is midline, oropharynx is clear and moist and mucous membranes are normal. No oropharyngeal exudate.  Eyes: Pupils are equal, round, and reactive to light. Conjunctivae and lids are normal. Right eye exhibits no discharge. Left eye exhibits no discharge. No scleral icterus.  Neck: Normal range of motion. Neck supple. No thyromegaly present.  Cardiovascular: Normal rate, regular rhythm, normal heart sounds and intact distal pulses.  No murmur heard. Pulses:      Radial pulses are 2+ on the right side, and 2+ on the left side.  Pulmonary/Chest: Effort normal and breath sounds normal. No respiratory distress. She has no wheezes. She has no rhonchi. She has no rales.  Abdominal: Soft. Normal  appearance and bowel sounds are normal. She exhibits no distension. There is no hepatosplenomegaly. There is tenderness (very mild) in the epigastric area. There is no rigidity and no guarding.  Musculoskeletal: Normal range of motion. She exhibits no edema, tenderness or deformity.  Lymphadenopathy:    She has no cervical adenopathy.  Neurological: She is alert and oriented to person, place, and time.  Skin: Skin is warm and dry. No rash noted. She is not diaphoretic.  Psychiatric: She has a normal mood and affect.  Nursing Burch and vitals reviewed.   Results for orders placed or performed during the hospital encounter of 10/15/17 (from the past 48 hour(s))  CBC     Status: Abnormal   Collection Time: 10/15/17  1:42 PM  Result Value Ref Range   WBC 12.1 (H) 4.0 - 10.5 K/uL   RBC 3.57 (  L) 3.87 - 5.11 MIL/uL   Hemoglobin 11.1 (L) 12.0 - 15.0 g/dL   HCT 33.6 (L) 36.0 - 46.0 %   MCV 94.1 78.0 - 100.0 fL   MCH 31.1 26.0 - 34.0 pg   MCHC 33.0 30.0 - 36.0 g/dL   RDW 13.1 11.5 - 15.5 %   Platelets 254 150 - 400 K/uL    Comment: Performed at [facility] [address], [place], [place] [address]  Lipase, blood     Status: Abnormal   Collection Time: 10/15/17  1:42 PM  Result Value Ref Range   Lipase 1,442 (H) 11 - 51 U/L    Comment: RESULTS CONFIRMED BY MANUAL DILUTION Performed at [facility], [place] [address], [place], [place] [address]   Comprehensive metabolic panel     Status: Abnormal   Collection Time: 10/15/17  1:42 PM  Result Value Ref Range   Sodium 134 (L) 135 - 145 mmol/L   Potassium 4.3 3.5 - 5.1 mmol/L   Chloride 103 101 - 111 mmol/L   CO2 22 22 - 32 mmol/L   Glucose, Bld 113 (H) 65 - 99 mg/dL   BUN 19 6 - 20 mg/dL   Creatinine, Ser 1.02 (H) 0.44 - 1.00 mg/dL   Calcium 9.4 8.9 - 10.3 mg/dL   Total Protein 7.6 6.5 - 8.1 g/dL   Albumin 3.3 (L) 3.5 - 5.0 g/dL   AST 85 (H) 15 - 41 U/L   ALT 37 14 - 54 U/L   Alkaline Phosphatase 75 38 - 126 U/L   Total  Bilirubin 0.5 0.3 - 1.2 mg/dL   GFR calc non Af Amer 48 (L) >60 mL/min   GFR calc Af Amer 55 (L) >60 mL/min    Comment: (Burch) The eGFR has been calculated using the CKD EPI equation. This calculation has not been validated in all clinical situations. eGFR's persistently <60 mL/min signify possible Chronic Kidney Disease.    Anion gap 9 5 - 15    Comment: Performed at [facility] [address], [place], [place] [address]  I-stat troponin, ED     Status: None   Collection Time: 10/15/17  1:56 PM  Result Value Ref Range   Troponin i, poc 0.00 0.00 - 0.08 ng/mL   Comment 3            Comment: Due to the release kinetics of cTnI, a negative result within the first hours of the onset of symptoms does not rule out myocardial infarction with certainty. If myocardial infarction is still suspected, repeat the test at appropriate intervals.   Urinalysis, Routine w reflex microscopic     Status: Abnormal   Collection Time: 10/15/17  7:23 PM  Result Value Ref Range   Color, Urine YELLOW YELLOW   APPearance HAZY (A) CLEAR   Specific Gravity, Urine 1.005 1.005 - 1.030   pH 6.0 5.0 - 8.0   Glucose, UA NEGATIVE NEGATIVE mg/dL   Hgb urine dipstick NEGATIVE NEGATIVE   Bilirubin Urine NEGATIVE NEGATIVE   Ketones, ur NEGATIVE NEGATIVE mg/dL   Protein, ur NEGATIVE NEGATIVE mg/dL   Nitrite NEGATIVE NEGATIVE   Leukocytes, UA MODERATE (A) NEGATIVE   RBC / HPF 0-5 0 - 5 RBC/hpf   WBC, UA 11-20 0 - 5 WBC/hpf   Bacteria, UA NONE SEEN NONE SEEN   Mucus PRESENT     Comment: Performed at [facility] [address] [place], [place] [address]  TSH  Status: None   Collection Time: 10/15/17  7:44 PM  Result Value Ref Range   TSH 1.925 0.350 - 4.500 uIU/mL    Comment: Performed by a 3rd Generation assay with a functional sensitivity of <=0.01 uIU/mL. Performed at [facility], [place] [address], [place], [place] [address]   Magnesium     Status: Abnormal   Collection Time:  10/15/17  7:44 PM  Result Value Ref Range   Magnesium 1.4 (L) 1.7 - 2.4 mg/dL    Comment: Performed at [facility] [address], [place], [place] [address]  CBC     Status: Abnormal   Collection Time: 10/16/17  5:57 AM  Result Value Ref Range   WBC 8.8 4.0 - 10.5 K/uL   RBC 3.44 (L) 3.87 - 5.11 MIL/uL   Hemoglobin 10.5 (L) 12.0 - 15.0 g/dL   HCT 32.4 (L) 36.0 - 46.0 %   MCV 94.2 78.0 - 100.0 fL   MCH 30.5 26.0 - 34.0 pg   MCHC 32.4 30.0 - 36.0 g/dL   RDW 13.3 11.5 - 15.5 %   Platelets 230 150 - 400 K/uL    Comment: Performed at [facility], [place] [address] [place], [place] [address]  Comprehensive metabolic panel     Status: Abnormal   Collection Time: 10/16/17  5:57 AM  Result Value Ref Range   Sodium 139 135 - 145 mmol/L   Potassium 4.0 3.5 - 5.1 mmol/L   Chloride 109 101 - 111 mmol/L   CO2 22 22 - 32 mmol/L   Glucose, Bld 80 65 - 99 mg/dL   BUN 14 6 - 20 mg/dL   Creatinine, Ser 0.92 0.44 - 1.00 mg/dL   Calcium 8.9 8.9 - 10.3 mg/dL   Total Protein 6.8 6.5 - 8.1 g/dL   Albumin 2.9 (L) 3.5 - 5.0 g/dL   AST 64 (H) 15 - 41 U/L   ALT 52 14 - 54 U/L   Alkaline Phosphatase 75 38 - 126 U/L   Total Bilirubin 0.7 0.3 - 1.2 mg/dL   GFR calc non Af Amer 54 (L) >60 mL/min   GFR calc Af Amer >60 >60 mL/min    Comment: (Burch) The eGFR has been calculated using the CKD EPI equation. This calculation has not been validated in all clinical situations. eGFR's persistently <60 mL/min signify possible Chronic Kidney Disease.    Anion gap 8 5 - 15    Comment: Performed at [facility] [address], [place], [place] [address]  Lipase, blood     Status: Abnormal   Collection Time: 10/16/17  5:57 AM  Result Value Ref Range   Lipase 140 (H) 11 - 51 U/L    Comment: Performed at [facility] [address], [place], [place] [address]  Lipid panel     Status: Abnormal   Collection Time: 10/16/17  5:57 AM  Result Value Ref Range   Cholesterol 181 0 - 200 mg/dL    Triglycerides 53 <150 mg/dL   HDL 47 >40 mg/dL   Total CHOL/HDL Ratio 3.9 RATIO   VLDL 11 0 - 40 mg/dL   LDL Cholesterol 123 (H) 0 - 99 mg/dL    Comment:        Total Cholesterol/HDL:CHD Risk Coronary Heart Disease Risk Table                     Men   Women  1/2 Average Risk  3.4   3.3  Average Risk       5.0   4.4  2 X Average Risk   9.6   7.1  3 X Average Risk  23.4   11.0        Use the calculated Patient Ratio above and the CHD Risk Table to determine the patient's CHD Risk.        ATP III CLASSIFICATION (LDL):  <100     mg/dL   Optimal  100-129  mg/dL   Near or Above                    Optimal  130-159  mg/dL   Borderline  160-189  mg/dL   High  >190     mg/dL   Very High Performed at [facility] [address] [place], [place] [address]   Magnesium     Status: Abnormal   Collection Time: 10/16/17  9:55 AM  Result Value Ref Range   Magnesium 1.3 (L) 1.7 - 2.4 mg/dL    Comment: Performed at [facility] [address] [place], [place] [address]   Dg Chest 2 View  Result Date: 10/15/2017 CLINICAL DATA:  Chest pain EXAM: CHEST - 2 VIEW COMPARISON:  01/30/2017 FINDINGS: Mild elevation of left diaphragm. No focal airspace disease or pleural effusion. Stable cardiomediastinal silhouette with aortic atherosclerosis. No pneumothorax. Mild degenerative changes of the spine. IMPRESSION: No active cardiopulmonary disease. Mild elevation of the left diaphragm. Electronically Signed   By: Donavan Foil M.D.   On: 10/15/2017 14:36   US Abdomen Complete  Result Date: 10/15/2017 CLINICAL DATA:  Abdominal pain.  Pancreatitis.  Elevated lipase. EXAM: ABDOMEN ULTRASOUND COMPLETE COMPARISON:  Abdominal ultrasound 06/25/2016. FINDINGS: Gallbladder: A few small stones measuring up to 7 mm and sludge are seen in the gallbladder. No pericholecystic fluid or wall thickening. Sonographer reports negative Murphy's sign. Common bile duct: Diameter: 0.4 cm Liver: No focal lesion  identified. Within normal limits in parenchymal echogenicity. Portal vein is patent on color Doppler imaging with normal direction of blood flow towards the liver. IVC: No abnormality visualized. Pancreas: Not visualized due to overlying bowel gas. Spleen: Size and appearance within normal limits. Right Kidney: Length: 12.6 cm. Echogenicity within normal limits. 5.2 cm cyst seen. No hydronephrosis visualized. Left Kidney: Length: 9.9 cm. Echogenicity within normal limits. 1.7 cm cyst seen. No hydronephrosis visualized. Abdominal aorta: No aneurysm visualized. Other findings: No free or focal fluid collection. IMPRESSION: Pancreas is not visualized. Small gallstones and gallbladder sludge. Negative for acute cholecystitis. Ectatic abdominal aorta at risk for aneurysm development. Recommend followup by ultrasound in 5 years. This recommendation follows ACR consensus guidelines: White Paper of the ACR Incidental Findings Committee II on Vascular Findings. J Am Coll Radiol 2013; 10:789-794. Electronically Signed   By: Inge Rise M.D.   On: 10/15/2017 17:53      Assessment/Plan Active Problems:   CLL (chronic lymphocytic leukemia) ([facility])   Hypertension   GERD (gastroesophageal reflux disease)   Hypothyroidism   Pancreatitis  Gallstone pancreatitis - patient is agreeable to surgery and we will likely take the patient to the OR tomorrow for laparoscopic cholecystectomy with Intraoperative cholangiogram  - NPO at midnight - am labs including lipase  Thank you for the consult  Kalman Drape, [facility] 10/16/2017, 11:27 AM Pager: [phone] Consults: [phone] Mon-Fri 7:00 am-4:30 pm Sat-Sun 7:00 am-11:30 am

## 2017-10-16 NOTE — Progress Notes (Addendum)
PROGRESS NOTE    Donna Burch  AQT:[id number] DOB: [birth year] DOA: 10/15/2017 PCP: Donna Stains, MD   Brief Narrative:   Donna Burch is Donna Burch 84 y.o. female with medical history significant of CLL, arthritis, orthostatic hypotension, and kidney stones comes in with Donna Burch few hour history of abdominal pain.  She was in normal health yesterday.  Today while sitting on the couch, she developed sharp epigastric abdominal pain.  +belching, + nausea but no vomiting. Had Donna Burch normal bowel movement today.  She has known gallstones.  Has seen [facility], most recently in 2018 but not sure what was recommended.  Denies new medications, denies alcohol.  No jaudice.  Does not think she drinks enough water  Found to have pancreatitis with gallstones on [place].  Presumed gallstone pancreatitis.  Plan for surgery tomorrow.  Assessment & Plan:   Active Problems:   CLL (chronic lymphocytic leukemia) ([facility])   Hypertension   GERD (gastroesophageal reflux disease)   Hypothyroidism   Pancreatitis   Gallstone pancreatitis   Gallstone Pancreatitis:  No etoh or obvious medication causes (prilosec <1%).  With gallstones on [place], this seems to be most likely cause.  Normal bili and alk phos.  AST mildly elevated and downtrending. Surgery c/s RCRI 1.  Pt able to walk around grocery store slowly.  Able to do stairs slowly.  Borderline for 4 mets.  Discussed risks of surgery.  Unlikely that additional preoperative testing will meaningfully change plan.  Will proceed with surgery tomorrow. Clear liquid diet, NPO MN Pain control  HTN: Uncontrolled right now.  Will restart losartan.  CLL: Follows with Donna Burch  Hypothyroidism: Follows with Dr. Buddy Burch Not on meds.  Normal TSH.  Hypomagnesemia: low mag, replete.  Will start PO.  Ectatic Abdominal Aorta: outpatient follow up  DVT prophylaxis: lovenox Code Status: full  Family Communication: daughter, son at bedside Disposition Plan: pending surgery   Consultants:    Surgery  Procedures:   none  Antimicrobials: Anti-infectives (From admission, onward)   Start     Dose/Rate Route Frequency Ordered Stop   10/17/17 0600  ceFAZolin (ANCEF) IVPB 2g/100 mL premix     2 g 200 mL/hr over 30 Minutes Intravenous To [facility] 10/16/17 1541 10/18/17 0600      Subjective: C/o epigastric pain. Improved.   Pain "ok" right now. Hungry.  Objective: Vitals:   10/15/17 2230 10/16/17 0233 10/16/17 0300 10/16/17 1300  BP: (!) 149/72 (!) 182/86 (!) 163/76 (!) 171/69  Pulse: 63 67 (!) 57 63  Resp: _0 Temp:  98 F (36.7 C) 97.8 F (36.6 C) 98.3 F (36.8 C)  TempSrc:  Oral Oral Oral  SpO2: 97% 97% 98% 96%  Weight:  64.7 kg (142 lb 10.2 oz)    Height:  _1  (1.6 m)      Intake/Output Summary (Last 24 hours) at 10/16/2017 1855 Last data filed at 10/16/2017 1700 Gross per 24 hour  Intake 1692.5 ml  Output 2100 ml  Net -407.5 ml   Filed Weights   10/15/17 2013 10/16/17 0233  Weight: 59 kg (130 lb) 64.7 kg (142 lb 10.2 oz)    Examination:  General exam: Appears calm and comfortable  Respiratory system: Clear to auscultation. Respiratory effort normal. Cardiovascular system: S1 & S2 heard, RRR. No JVD, murmurs, rubs, gallops or clicks. No pedal edema. Gastrointestinal system: Abdomen is nondistended, soft and mildly ttp in epigastric region. No organomegaly or masses felt. Normal bowel sounds heard. Central  nervous system: Alert and oriented. No focal neurological deficits. Extremities: Symmetric 5 x 5 power. Skin: No rashes, lesions or ulcers Psychiatry: Judgement and insight appear normal. Mood & affect appropriate.     Data Reviewed: I have personally reviewed following labs and imaging studies  CBC: Recent Labs  Lab 10/15/17 1342 10/16/17 0557  WBC 12.1* 8.8  HGB 11.1* 10.5*  HCT 33.6* 32.4*  MCV 94.1 94.2  PLT 254 606   Basic Metabolic Panel: Recent Labs  Lab 10/15/17 1342 10/15/17 1944 10/16/17 0557  10/16/17 0955  NA 134*  --  139  --   K 4.3  --  4.0  --   CL 103  --  109  --   CO2 22  --  22  --   GLUCOSE 113*  --  80  --   BUN 19  --  14  --   CREATININE 1.02*  --  0.92  --   CALCIUM 9.4  --  8.9  --   MG  --  1.4*  --  1.3*   GFR: Estimated Creatinine Clearance: 38.2 mL/min (by C-G formula based on SCr of 0.92 mg/dL). Liver Function Tests: Recent Labs  Lab 10/15/17 1342 10/16/17 0557  AST 85* 64*  ALT 37 52  ALKPHOS 75 75  BILITOT 0.5 0.7  PROT 7.6 6.8  ALBUMIN 3.3* 2.9*   Recent Labs  Lab 10/15/17 1342 10/16/17 0557  LIPASE 1,442* 140*   No results for input(s): AMMONIA in the last 168 hours. Coagulation Profile: No results for input(s): INR, PROTIME in the last 168 hours. Cardiac Enzymes: No results for input(s): CKTOTAL, CKMB, CKMBINDEX, TROPONINI in the last 168 hours. BNP (last 3 results) No results for input(s): PROBNP in the last 8760 hours. HbA1C: No results for input(s): HGBA1C in the last 72 hours. CBG: No results for input(s): GLUCAP in the last 168 hours. Lipid Profile: Recent Labs    10/16/17 0557  CHOL 181  HDL 47  LDLCALC 123*  TRIG 53  CHOLHDL 3.9   Thyroid Function Tests: Recent Labs    10/15/17 1944  TSH 1.925   Anemia Panel: No results for input(s): VITAMINB12, FOLATE, FERRITIN, TIBC, IRON, RETICCTPCT in the last 72 hours. Sepsis Labs: No results for input(s): PROCALCITON, LATICACIDVEN in the last 168 hours.  No results found for this or any previous visit (from the past 240 hour(s)).       Radiology Studies: Dg Chest 2 View  Result Date: 10/15/2017 CLINICAL DATA:  Chest pain EXAM: CHEST - 2 VIEW COMPARISON:  01/30/2017 FINDINGS: Mild elevation of left diaphragm. No focal airspace disease or pleural effusion. Stable cardiomediastinal silhouette with aortic atherosclerosis. No pneumothorax. Mild degenerative changes of the spine. IMPRESSION: No active cardiopulmonary disease. Mild elevation of the left diaphragm.  Electronically Signed   By: Donna Foil M.D.   On: 10/15/2017 14:36   US Abdomen Complete  Result Date: 10/15/2017 CLINICAL DATA:  Abdominal pain.  Pancreatitis.  Elevated lipase. EXAM: ABDOMEN ULTRASOUND COMPLETE COMPARISON:  Abdominal ultrasound 06/25/2016. FINDINGS: Gallbladder: Alyanna Stoermer few small stones measuring up to 7 mm and sludge are seen in the gallbladder. No pericholecystic fluid or wall thickening. Sonographer reports negative Murphy's sign. Common bile duct: Diameter: 0.4 cm Liver: No focal lesion identified. Within normal limits in parenchymal echogenicity. Portal vein is patent on color Doppler imaging with normal direction of blood flow towards the liver. IVC: No abnormality visualized. Pancreas: Not visualized due to overlying bowel gas. Spleen: Size and  appearance within normal limits. Right Kidney: Length: 12.6 cm. Echogenicity within normal limits. 5.2 cm cyst seen. No hydronephrosis visualized. Left Kidney: Length: 9.9 cm. Echogenicity within normal limits. 1.7 cm cyst seen. No hydronephrosis visualized. Abdominal aorta: No aneurysm visualized. Other findings: No free or focal fluid collection. IMPRESSION: Pancreas is not visualized. Small gallstones and gallbladder sludge. Negative for acute cholecystitis. Ectatic abdominal aorta at risk for aneurysm development. Recommend followup by ultrasound in 5 years. This recommendation follows ACR consensus guidelines: White Paper of the ACR Incidental Findings Committee II on Vascular Findings. J Am Coll Radiol 2013; 10:789-794. Electronically Signed   By: Inge Rise M.D.   On: 10/15/2017 17:53        Scheduled Meds: . enoxaparin (LOVENOX) injection  40 mg Subcutaneous Q24H   Continuous Infusions: . [START ON 10/17/2017]  ceFAZolin (ANCEF) IV    . famotidine (PEPCID) IV Stopped (10/16/17 1043)  . lactated ringers 75 mL/hr at 10/16/17 1123  . magnesium sulfate 1 - 4 g bolus IVPB       LOS: 0 days    Time spent: over 30  min    Fayrene Helper, MD [facility] Pager [phone]  If 7PM-7AM, please contact night-coverage [email or web address] Password [facility] 10/16/2017, 6:55 PM

## 2017-10-16 NOTE — Plan of Care (Signed)
  Problem: Nutrition: Goal: Adequate nutrition will be maintained Outcome: Progressing   Problem: Pain Managment: Goal: General experience of comfort will improve Outcome: Progressing   

## 2017-10-16 NOTE — Progress Notes (Signed)
  Pt admitted to the unit. Pt is stable, alert and oriented per baseline. Oriented to room, staff, and call bell. Educated to call for any assistance. Bed in lowest position, call bell within reach- will continue to monitor. 

## 2017-10-17 ENCOUNTER — Inpatient Hospital Stay (HOSPITAL_COMMUNITY): Payer: Medicare Other | Admitting: Certified Registered"

## 2017-10-17 ENCOUNTER — Encounter (HOSPITAL_COMMUNITY): Payer: Self-pay

## 2017-10-17 ENCOUNTER — Encounter (HOSPITAL_COMMUNITY)
Admission: EM | Disposition: A | Discharge status: Home Health | Payer: Self-pay | Source: Home / Self Care | Attending: Family Medicine

## 2017-10-17 ENCOUNTER — Inpatient Hospital Stay (HOSPITAL_COMMUNITY): Payer: Medicare Other

## 2017-10-17 DIAGNOSIS — K8010 Calculus of gallbladder with chronic cholecystitis without obstruction: Secondary | ICD-10-CM | POA: Diagnosis not present

## 2017-10-17 DIAGNOSIS — K851 Biliary acute pancreatitis without necrosis or infection: Secondary | ICD-10-CM | POA: Diagnosis not present

## 2017-10-17 HISTORY — PX: CHOLECYSTECTOMY: SHX55

## 2017-10-17 LAB — COMPREHENSIVE METABOLIC PANEL
ALT: 34 U/L (ref 14–54)
AST: 47 U/L — AB (ref 15–41)
Albumin: 2.9 g/dL — ABNORMAL LOW (ref 3.5–5.0)
Alkaline Phosphatase: 71 U/L (ref 38–126)
Anion gap: 8 (ref 5–15)
BUN: 8 mg/dL (ref 6–20)
CHLORIDE: 107 mmol/L (ref 101–111)
CO2: 23 mmol/L (ref 22–32)
Calcium: 8.8 mg/dL — ABNORMAL LOW (ref 8.9–10.3)
Creatinine, Ser: 0.96 mg/dL (ref 0.44–1.00)
GFR calc Af Amer: 59 mL/min — ABNORMAL LOW (ref >60)
GFR, EST NON AFRICAN AMERICAN: 51 mL/min — AB (ref >60)
Glucose, Bld: 87 mg/dL (ref 65–99)
POTASSIUM: 4 mmol/L (ref 3.5–5.1)
SODIUM: 138 mmol/L (ref 135–145)
Total Bilirubin: 1.2 mg/dL (ref 0.3–1.2)
Total Protein: 6.8 g/dL (ref 6.5–8.1)

## 2017-10-17 LAB — LIPASE, BLOOD: LIPASE: 50 U/L (ref 11–51)

## 2017-10-17 LAB — CBC
HCT: 31.5 % — ABNORMAL LOW (ref 36.0–46.0)
Hemoglobin: 11 g/dL — ABNORMAL LOW (ref 12.0–15.0)
MCH: 32.5 pg (ref 26.0–34.0)
MCHC: 34.9 g/dL (ref 30.0–36.0)
MCV: 93.2 fL (ref 78.0–100.0)
PLATELETS: 255 10*3/uL (ref 150–400)
RBC: 3.38 MIL/uL — ABNORMAL LOW (ref 3.87–5.11)
RDW: 13.2 % (ref 11.5–15.5)
WBC: 9.9 10*3/uL (ref 4.0–10.5)

## 2017-10-17 LAB — MAGNESIUM: Magnesium: 2.3 mg/dL (ref 1.7–2.4)

## 2017-10-17 SURGERY — LAPAROSCOPIC CHOLECYSTECTOMY WITH INTRAOPERATIVE CHOLANGIOGRAM
Anesthesia: General

## 2017-10-17 MED ORDER — ROCURONIUM BROMIDE 10 MG/ML (PF) SYRINGE
PREFILLED_SYRINGE | INTRAVENOUS | Status: AC
  Filled 2017-10-17: qty 5

## 2017-10-17 MED ORDER — TRAMADOL HCL 50 MG PO TABS
50.0000 mg | ORAL_TABLET | Freq: Four times a day (QID) | ORAL | Status: DC | PRN
  Administered 2017-10-17: 50 mg via ORAL
  Filled 2017-10-17 (×2): qty 1

## 2017-10-17 MED ORDER — ROCURONIUM BROMIDE 10 MG/ML (PF) SYRINGE
PREFILLED_SYRINGE | INTRAVENOUS | Status: DC | PRN
  Administered 2017-10-17: 30 mg via INTRAVENOUS

## 2017-10-17 MED ORDER — ENOXAPARIN SODIUM 40 MG/0.4ML ~~LOC~~ SOLN: 40.0000 mg | SUBCUTANEOUS | Status: DC

## 2017-10-17 MED ORDER — BUPIVACAINE-EPINEPHRINE (PF) 0.25% -1:200000 IJ SOLN
INTRAMUSCULAR | Status: AC
  Filled 2017-10-17: qty 30

## 2017-10-17 MED ORDER — CEFAZOLIN SODIUM-DEXTROSE 2-3 GM-%(50ML) IV SOLR
INTRAVENOUS | Status: DC | PRN
  Administered 2017-10-17: 2 g via INTRAVENOUS

## 2017-10-17 MED ORDER — FENTANYL CITRATE (PF) 250 MCG/5ML IJ SOLN
INTRAMUSCULAR | Status: AC
  Filled 2017-10-17: qty 5

## 2017-10-17 MED ORDER — METOPROLOL TARTRATE 5 MG/5ML IV SOLN
INTRAVENOUS | Status: AC
  Filled 2017-10-17: qty 5

## 2017-10-17 MED ORDER — LIDOCAINE 2% (20 MG/ML) 5 ML SYRINGE
INTRAMUSCULAR | Status: AC
  Filled 2017-10-17: qty 5

## 2017-10-17 MED ORDER — ACETAMINOPHEN 325 MG PO TABS: 650.0000 mg | ORAL_TABLET | Freq: Four times a day (QID) | ORAL | Status: DC | PRN

## 2017-10-17 MED ORDER — FENTANYL CITRATE (PF) 100 MCG/2ML IJ SOLN: 25.0000 ug | INTRAMUSCULAR | Status: DC | PRN

## 2017-10-17 MED ORDER — BUPIVACAINE-EPINEPHRINE 0.25% -1:200000 IJ SOLN
INTRAMUSCULAR | Status: DC | PRN
  Administered 2017-10-17: 11 mL

## 2017-10-17 MED ORDER — STERILE WATER FOR IRRIGATION IR SOLN
Status: DC | PRN
  Administered 2017-10-17: 1000 mL

## 2017-10-17 MED ORDER — ONDANSETRON HCL 4 MG/2ML IJ SOLN
INTRAMUSCULAR | Status: AC
  Filled 2017-10-17: qty 2

## 2017-10-17 MED ORDER — SODIUM CHLORIDE 0.9 % IV SOLN
INTRAVENOUS | Status: DC | PRN
  Administered 2017-10-17: 15 mL

## 2017-10-17 MED ORDER — DEXAMETHASONE SODIUM PHOSPHATE 10 MG/ML IJ SOLN
INTRAMUSCULAR | Status: AC
  Filled 2017-10-17: qty 1

## 2017-10-17 MED ORDER — LIDOCAINE 2% (20 MG/ML) 5 ML SYRINGE
INTRAMUSCULAR | Status: DC | PRN
  Administered 2017-10-17: 60 mg via INTRAVENOUS

## 2017-10-17 MED ORDER — LACTATED RINGERS IV SOLN: INTRAVENOUS | Status: DC

## 2017-10-17 MED ORDER — SODIUM CHLORIDE 0.9 % IR SOLN
Status: DC | PRN
  Administered 2017-10-17: 1000 mL

## 2017-10-17 MED ORDER — 0.9 % SODIUM CHLORIDE (POUR BTL) OPTIME
TOPICAL | Status: DC | PRN
  Administered 2017-10-17: 1000 mL

## 2017-10-17 MED ORDER — SUGAMMADEX SODIUM 200 MG/2ML IV SOLN
INTRAVENOUS | Status: DC | PRN
  Administered 2017-10-17: 130 mg via INTRAVENOUS

## 2017-10-17 MED ORDER — PIPERACILLIN-TAZOBACTAM 3.375 G IVPB
3.3750 g | Freq: Three times a day (TID) | INTRAVENOUS | Status: DC
  Administered 2017-10-17 – 2017-10-19 (×6): 3.375 g via INTRAVENOUS
  Filled 2017-10-17 (×8): qty 50

## 2017-10-17 MED ORDER — DEXAMETHASONE SODIUM PHOSPHATE 10 MG/ML IJ SOLN
INTRAMUSCULAR | Status: DC | PRN
  Administered 2017-10-17: 5 mg via INTRAVENOUS

## 2017-10-17 MED ORDER — OXYCODONE HCL 5 MG PO TABS: 5.0000 mg | ORAL_TABLET | ORAL | Status: DC | PRN

## 2017-10-17 MED ORDER — PROPOFOL 10 MG/ML IV BOLUS
INTRAVENOUS | Status: AC
Start: 2017-10-17 — End: 2017-10-17
  Filled 2017-10-17: qty 20

## 2017-10-17 MED ORDER — PROPOFOL 10 MG/ML IV BOLUS
INTRAVENOUS | Status: DC | PRN
  Administered 2017-10-17: 80 mg via INTRAVENOUS
  Administered 2017-10-17: 30 mg via INTRAVENOUS

## 2017-10-17 MED ORDER — FENTANYL CITRATE (PF) 100 MCG/2ML IJ SOLN
INTRAMUSCULAR | Status: DC | PRN
  Administered 2017-10-17: 100 ug via INTRAVENOUS
  Administered 2017-10-17 (×3): 50 ug via INTRAVENOUS

## 2017-10-17 MED ORDER — SODIUM CHLORIDE 0.9 % IV SOLN
INTRAVENOUS | Status: DC
  Administered 2017-10-18: 02:00:00 via INTRAVENOUS

## 2017-10-17 MED ORDER — SUGAMMADEX SODIUM 200 MG/2ML IV SOLN
INTRAVENOUS | Status: AC
  Filled 2017-10-17: qty 2

## 2017-10-17 MED ORDER — MORPHINE SULFATE (PF) 2 MG/ML IV SOLN: 1.0000 mg | INTRAVENOUS | Status: DC | PRN

## 2017-10-17 MED ORDER — LACTATED RINGERS IV SOLN
INTRAVENOUS | Status: DC
  Administered 2017-10-17: 10:00:00 via INTRAVENOUS

## 2017-10-17 MED ORDER — ONDANSETRON HCL 4 MG/2ML IJ SOLN
INTRAMUSCULAR | Status: DC | PRN
  Administered 2017-10-17: 4 mg via INTRAVENOUS

## 2017-10-17 MED ORDER — IOPAMIDOL (ISOVUE-300) INJECTION 61%
INTRAVENOUS | Status: AC
  Filled 2017-10-17: qty 50

## 2017-10-17 SURGICAL SUPPLY — 38 items
APPLIER CLIP 5 13 M/L LIGAMAX5 (MISCELLANEOUS) ×3
APR CLP MED LRG 5 ANG JAW (MISCELLANEOUS) ×1
CANISTER SUCT 3000ML PPV (MISCELLANEOUS) ×3 IMPLANT
CHLORAPREP W/TINT 26ML (MISCELLANEOUS) ×3 IMPLANT
CLIP APPLIE 5 13 M/L LIGAMAX5 (MISCELLANEOUS) ×1 IMPLANT
COVER MAYO STAND STRL (DRAPES) ×3 IMPLANT
COVER SURGICAL LIGHT HANDLE (MISCELLANEOUS) ×3 IMPLANT
DERMABOND ADVANCED (GAUZE/BANDAGES/DRESSINGS) ×2
DERMABOND ADVANCED .7 DNX12 (GAUZE/BANDAGES/DRESSINGS) ×1 IMPLANT
DRAPE C-ARM 42X72 X-RAY (DRAPES) ×3 IMPLANT
ELECT REM PT RETURN 9FT ADLT (ELECTROSURGICAL) ×3
ELECTRODE REM PT RTRN 9FT ADLT (ELECTROSURGICAL) ×1 IMPLANT
GLOVE BIO SURGEON STRL SZ 6 (GLOVE) ×3 IMPLANT
GLOVE BIOGEL PI IND STRL 6.5 (GLOVE) ×1 IMPLANT
GLOVE BIOGEL PI INDICATOR 6.5 (GLOVE) ×2
GLOVE SS BIOGEL STRL SZ 7 (GLOVE) ×1 IMPLANT
GLOVE SUPERSENSE BIOGEL SZ 7 (GLOVE) ×2
GOWN STRL REUS W/ TWL LRG LVL3 (GOWN DISPOSABLE) ×3 IMPLANT
GOWN STRL REUS W/TWL LRG LVL3 (GOWN DISPOSABLE) ×6
GRASPER SUT TROCAR 14GX15 (MISCELLANEOUS) ×3 IMPLANT
KIT BASIN OR (CUSTOM PROCEDURE TRAY) ×3 IMPLANT
KIT TURNOVER KIT B (KITS) ×3 IMPLANT
NEEDLE INSUFFLATION 14GA 120MM (NEEDLE) ×9 IMPLANT
NS IRRIG 1000ML POUR BTL (IV SOLUTION) ×3 IMPLANT
PAD ARMBOARD 7.5X6 YLW CONV (MISCELLANEOUS) ×3 IMPLANT
POUCH SPECIMEN RETRIEVAL 10MM (ENDOMECHANICALS) ×3 IMPLANT
SCISSORS LAP 5X35 DISP (ENDOMECHANICALS) ×3 IMPLANT
SET CHOLANGIOGRAPH 5 50 .035 (SET/KITS/TRAYS/PACK) ×3 IMPLANT
SET IRRIG TUBING LAPAROSCOPIC (IRRIGATION / IRRIGATOR) ×3 IMPLANT
SLEEVE ENDOPATH XCEL 5M (ENDOMECHANICALS) ×3 IMPLANT
SPECIMEN JAR SMALL (MISCELLANEOUS) ×3 IMPLANT
SUT MNCRL AB 4-0 PS2 18 (SUTURE) ×3 IMPLANT
TOWEL OR 17X24 6PK STRL BLUE (TOWEL DISPOSABLE) ×3 IMPLANT
TRAY LAPAROSCOPIC MC (CUSTOM PROCEDURE TRAY) ×3 IMPLANT
TROCAR XCEL NON-BLD 11X100MML (ENDOMECHANICALS) ×3 IMPLANT
TROCAR XCEL NON-BLD 5MMX100MML (ENDOMECHANICALS) ×3 IMPLANT
TUBING INSUFFLATION (TUBING) ×3 IMPLANT
WATER STERILE IRR 1000ML POUR (IV SOLUTION) ×3 IMPLANT

## 2017-10-17 NOTE — Anesthesia Postprocedure Evaluation (Signed)
Anesthesia Post Note  Patient: Donna Burch  Procedure(s) Performed: LAPAROSCOPIC CHOLECYSTECTOMY WITH INTRAOPERATIVE CHOLANGIOGRAM (N/A )     Patient location during evaluation: PACU Anesthesia Type: General Level of consciousness: awake Pain management: pain level controlled Vital Signs Assessment: post-procedure vital signs reviewed and stable Respiratory status: spontaneous breathing Cardiovascular status: stable Anesthetic complications: no    Last Vitals:  Vitals:   10/16/17 2025 10/17/17 0700  BP: (!) 168/76 (!) 165/84  Pulse: 73 71  Resp: 17   Temp: 37 C 36.7 C  SpO2: 99% 99%    Last Pain:  Vitals:   10/17/17 0700  TempSrc: Oral  PainSc:                  Nolberto Cheuvront

## 2017-10-17 NOTE — Anesthesia Preprocedure Evaluation (Addendum)
Anesthesia Evaluation  Patient identified by MRN, date of birth, ID band Patient awake    Reviewed: Allergy & Precautions, NPO status , Patient's Chart, lab work & pertinent test results  History of Anesthesia Complications (+) PONV  Airway Mallampati: II  TM Distance: >3 FB     Dental  (+) Teeth Intact, Chipped, Dental Advisory Given,    Pulmonary pneumonia,    breath sounds clear to auscultation       Cardiovascular hypertension,  Rhythm:Regular Rate:Normal     Neuro/Psych    GI/Hepatic hiatal hernia, GERD  ,  Endo/Other  Hypothyroidism   Renal/GU Renal disease     Musculoskeletal  (+) Arthritis ,   Abdominal   Peds  Hematology   Anesthesia Other Findings   Reproductive/Obstetrics                            Anesthesia Physical Anesthesia Plan  ASA: III  Anesthesia Plan: General   Post-op Pain Management:    Induction: Intravenous  PONV Risk Score and Plan: 3 and 4 or greater and Treatment may vary due to age or medical condition  Airway Management Planned: Oral ETT  Additional Equipment:   Intra-op Plan:   Post-operative Plan: Possible Post-op intubation/ventilation  Informed Consent: I have reviewed the patients History and Physical, chart, labs and discussed the procedure including the risks, benefits and alternatives for the proposed anesthesia with the patient or authorized representative who has indicated his/her understanding and acceptance.   Dental advisory given  Plan Discussed with: CRNA and Anesthesiologist  Anesthesia Plan Comments:         Anesthesia Quick Evaluation

## 2017-10-17 NOTE — Progress Notes (Signed)
Subjective/Chief Complaint: No acute events, not having pain, still belching   Objective: Vital signs in last 24 hours: Temp:  [98.1 F (36.7 C)-98.6 F (37 C)] 98.1 F (36.7 C) (04/26 0700) Pulse Rate:  [63-73] 71 (04/26 0700) Resp:  [17] 17 (04/25 2025) BP: (165-171)/(69-84) 165/84 (04/26 0700) SpO2:  [96 %-99 %] 99 % (04/26 0700) Last BM Date: 10/15/17  Intake/Output from previous day: 04/25 0701 - 04/26 0700 In: 680 [P.O.:480; IV Piggyback:200] Out: 2300 [Urine:2300] Intake/Output this shift: No intake/output data recorded.  General appearance: alert and cooperative Resp: clear to auscultation bilaterally GI: soft, non-tender; bowel sounds normal; no masses,  no organomegaly Skin: Skin color, texture, turgor normal. No rashes or lesions  Lab Results:  Recent Labs    10/15/17 1342 10/16/17 0557  WBC 12.1* 8.8  HGB 11.1* 10.5*  HCT 33.6* 32.4*  PLT 254 230   BMET Recent Labs    10/15/17 1342 10/16/17 0557  NA 134* 139  K 4.3 4.0  CL 103 109  CO2 22 22  GLUCOSE 113* 80  BUN 19 14  CREATININE 1.02* 0.92  CALCIUM 9.4 8.9   PT/INR No results for input(s): LABPROT, INR in the last 72 hours. ABG No results for input(s): PHART, HCO3 in the last 72 hours.  Invalid input(s): PCO2, PO2  Studies/Results: Dg Chest 2 View  Result Date: 10/15/2017 CLINICAL DATA:  Chest pain EXAM: CHEST - 2 VIEW COMPARISON:  01/30/2017 FINDINGS: Mild elevation of left diaphragm. No focal airspace disease or pleural effusion. Stable cardiomediastinal silhouette with aortic atherosclerosis. No pneumothorax. Mild degenerative changes of the spine. IMPRESSION: No active cardiopulmonary disease. Mild elevation of the left diaphragm. Electronically Signed   By: Donavan Foil M.D.   On: 10/15/2017 14:36   US Abdomen Complete  Result Date: 10/15/2017 CLINICAL DATA:  Abdominal pain.  Pancreatitis.  Elevated lipase. EXAM: ABDOMEN ULTRASOUND COMPLETE COMPARISON:  Abdominal ultrasound  06/25/2016. FINDINGS: Gallbladder: A few small stones measuring up to 7 mm and sludge are seen in the gallbladder. No pericholecystic fluid or wall thickening. Sonographer reports negative Murphy's sign. Common bile duct: Diameter: 0.4 cm Liver: No focal lesion identified. Within normal limits in parenchymal echogenicity. Portal vein is patent on color Doppler imaging with normal direction of blood flow towards the liver. IVC: No abnormality visualized. Pancreas: Not visualized due to overlying bowel gas. Spleen: Size and appearance within normal limits. Right Kidney: Length: 12.6 cm. Echogenicity within normal limits. 5.2 cm cyst seen. No hydronephrosis visualized. Left Kidney: Length: 9.9 cm. Echogenicity within normal limits. 1.7 cm cyst seen. No hydronephrosis visualized. Abdominal aorta: No aneurysm visualized. Other findings: No free or focal fluid collection. IMPRESSION: Pancreas is not visualized. Small gallstones and gallbladder sludge. Negative for acute cholecystitis. Ectatic abdominal aorta at risk for aneurysm development. Recommend followup by ultrasound in 5 years. This recommendation follows ACR consensus guidelines: White Paper of the ACR Incidental Findings Committee II on Vascular Findings. J Am Coll Radiol 2013; 10:789-794. Electronically Signed   By: Inge Rise M.D.   On: 10/15/2017 17:53    Anti-infectives: Anti-infectives (From admission, onward)   Start     Dose/Rate Route Frequency Ordered Stop   10/17/17 0600  ceFAZolin (ANCEF) IVPB 2g/100 mL premix     2 g 200 mL/hr over 30 Minutes Intravenous To [facility] 10/16/17 1541 10/18/17 0600      Assessment/Plan: s/p Procedure(s): LAPAROSCOPIC CHOLECYSTECTOMY WITH INTRAOPERATIVE CHOLANGIOGRAM (N/A) OR for laparoscopic cholecystectomy. I discussed with her and her family  yesterday the technical aspect of the surgery as well as the risks versus benefits. Their questions have been answered. Proceed as planned.  LOS: 1  day    Clovis Riley 10/17/2017

## 2017-10-17 NOTE — Transfer of Care (Signed)
Immediate Anesthesia Transfer of Care Note  Patient: Donna Burch  Procedure(s) Performed: LAPAROSCOPIC CHOLECYSTECTOMY WITH INTRAOPERATIVE CHOLANGIOGRAM (N/A )  Patient Location: PACU  Anesthesia Type:General  Level of Consciousness: awake, alert  and oriented  Airway & Oxygen Therapy: Patient Spontanous Breathing  Post-op Assessment: Report given to RN  Post vital signs: Reviewed and stable  Last Vitals:  Vitals Value Taken Time  BP 177/101 10/17/2017  1:14 PM  Temp    Pulse 92 10/17/2017  1:16 PM  Resp 18 10/17/2017  1:16 PM  SpO2 96 % 10/17/2017  1:16 PM  Vitals shown include unvalidated device data.  Last Pain:  Vitals:   10/17/17 0700  TempSrc: Oral  PainSc:       Patients Stated Pain Goal: 3 (92/01/00 7121)  Complications: No apparent anesthesia complications

## 2017-10-17 NOTE — H&P (View-Only) (Signed)
Reason for Consult: Abnormal IOC Referring Physician: Surgical team  Donna Burch is an 84 y.o. female.  HPI: Patient seen and examined in her hospital computer chart reviewed and our office computer chart reviewed and she looks good postop and is eating some solid food and her Intra-Op cholangiogram was reviewed and I discussed ERCP with her in multiple family members and currently other than her arthritis she has no complaints  Past Medical History:  Diagnosis Date  . Arthritis    "right middle finger; hands" (01/03/2015)  . CLL (chronic lymphocytic leukemia) ([facility])   . Dyspnea on exertion    Improved with exercise  . Elevated brain natriuretic peptide (BNP) level    Slight  . Family history of adverse reaction to anesthesia    son, Marita Snellen, w/PONV  . Frequent UTI    "used to; none lately" (01/03/2015)  . Gastroenteritis 09/2010   c. dificile.  hospitalized in ICU x 3 days  . GERD (gastroesophageal reflux disease)   . Hiatal hernia   . History of Clostridium difficile ?03/2012   "after taking ATB for one of my UTI's"  . Hyperlipidemia   . Hypertension   . Hypothyroidism   . Nephrolithiasis   . Nocturia    x3  . Pneumonia    Required hospital stay at the time  . PONV (postoperative nausea and vomiting)   . Renal insufficiency   . Seasonal allergies   . Skin cancer    right anterior neck  . Wears glasses     Past Surgical History:  Procedure Laterality Date  . CATARACT EXTRACTION, BILATERAL  2010  . COLONOSCOPY    . CYSTOSCOPY W/ STONE MANIPULATION Right   . CYSTOSCOPY WITH STENT PLACEMENT    . ESOPHAGOGASTRODUODENOSCOPY    . ESOPHAGOGASTRODUODENOSCOPY (EGD) WITH ESOPHAGEAL DILATION    . KNEE ARTHROSCOPY Left   . LITHOTRIPSY     "didn't work"  . SKIN CANCER EXCISION Right    anterior neck  . THYROID LOBECTOMY Right 01/03/2015  . THYROID LOBECTOMY Right 01/03/2015   Procedure: RIGHT THYROID LOBECTOMY;  Surgeon: Armandina Gemma, MD;  Location: [facility];  Service: General;   Laterality: Right;  . TONSILLECTOMY    . WISDOM TOOTH EXTRACTION      Family History  Problem Relation Age of Onset  . Heart attack Mother   . Hyperlipidemia Mother   . Asthma Father   . Diabetes Father     Social History:  reports that she has never smoked. She has never used smokeless tobacco. She reports that she does not drink alcohol or use drugs.  Allergies:  Allergies  Allergen Reactions  . Prednisone Other (See Comments)    "Jittery"  . Reclast [Zoledronic Acid] Other (See Comments)    Pt did not have a memory, from the day after she took that medication   . Hydrochlorothiazide Other (See Comments)  . Lisinopril Cough    Medications: I have reviewed the patient's current medications.  Results for orders placed or performed during the hospital encounter of 10/15/17 (from the past 48 hour(s))  Urinalysis, Routine w reflex microscopic     Status: Abnormal   Collection Time: 10/15/17  7:23 PM  Result Value Ref Range   Color, Urine YELLOW YELLOW   APPearance HAZY (A) CLEAR   Specific Gravity, Urine 1.005 1.005 - 1.030   pH 6.0 5.0 - 8.0   Glucose, UA NEGATIVE NEGATIVE mg/dL   Hgb urine dipstick NEGATIVE NEGATIVE   Bilirubin Urine NEGATIVE  NEGATIVE   Ketones, ur NEGATIVE NEGATIVE mg/dL   Protein, ur NEGATIVE NEGATIVE mg/dL   Nitrite NEGATIVE NEGATIVE   Leukocytes, UA MODERATE (A) NEGATIVE   RBC / HPF 0-5 0 - 5 RBC/hpf   WBC, UA 11-20 0 - 5 WBC/hpf   Bacteria, UA NONE SEEN NONE SEEN   Mucus PRESENT     Comment: Performed at [facility] [address] [place], [place] [address]  TSH     Status: None   Collection Time: 10/15/17  7:44 PM  Result Value Ref Range   TSH 1.925 0.350 - 4.500 uIU/mL    Comment: Performed by a 3rd Generation assay with a functional sensitivity of <=0.01 uIU/mL. Performed at [facility], [place] [address] [place], [place] [address]   Magnesium     Status: Abnormal   Collection Time: 10/15/17  7:44 PM  Result Value Ref  Range   Magnesium 1.4 (L) 1.7 - 2.4 mg/dL    Comment: Performed at [facility] [address], [place], [place] [address]  CBC     Status: Abnormal   Collection Time: 10/16/17  5:57 AM  Result Value Ref Range   WBC 8.8 4.0 - 10.5 K/uL   RBC 3.44 (L) 3.87 - 5.11 MIL/uL   Hemoglobin 10.5 (L) 12.0 - 15.0 g/dL   HCT 32.4 (L) 36.0 - 46.0 %   MCV 94.2 78.0 - 100.0 fL   MCH 30.5 26.0 - 34.0 pg   MCHC 32.4 30.0 - 36.0 g/dL   RDW 13.3 11.5 - 15.5 %   Platelets 230 150 - 400 K/uL    Comment: Performed at [facility], [place] [address] [place], [place] [address]  Comprehensive metabolic panel     Status: Abnormal   Collection Time: 10/16/17  5:57 AM  Result Value Ref Range   Sodium 139 135 - 145 mmol/L   Potassium 4.0 3.5 - 5.1 mmol/L   Chloride 109 101 - 111 mmol/L   CO2 22 22 - 32 mmol/L   Glucose, Bld 80 65 - 99 mg/dL   BUN 14 6 - 20 mg/dL   Creatinine, Ser 0.92 0.44 - 1.00 mg/dL   Calcium 8.9 8.9 - 10.3 mg/dL   Total Protein 6.8 6.5 - 8.1 g/dL   Albumin 2.9 (L) 3.5 - 5.0 g/dL   AST 64 (H) 15 - 41 U/L   ALT 52 14 - 54 U/L   Alkaline Phosphatase 75 38 - 126 U/L   Total Bilirubin 0.7 0.3 - 1.2 mg/dL   GFR calc non Af Amer 54 (L) >60 mL/min   GFR calc Af Amer >60 >60 mL/min    Comment: (NOTE) The eGFR has been calculated using the CKD EPI equation. This calculation has not been validated in all clinical situations. eGFR's persistently <60 mL/min signify possible Chronic Kidney Disease.    Anion gap 8 5 - 15    Comment: Performed at [facility] [address], [place], [place] [address]  Lipase, blood     Status: Abnormal   Collection Time: 10/16/17  5:57 AM  Result Value Ref Range   Lipase 140 (H) 11 - 51 U/L    Comment: Performed at [facility] [address] [place],  [address]  Lipid panel     Status: Abnormal   Collection Time: 10/16/17  5:57 AM  Result Value Ref Range   Cholesterol 181 0 - 200 mg/dL   Triglycerides  53 <150 mg/dL    HDL 47 >40 mg/dL   Total CHOL/HDL Ratio 3.9 RATIO   VLDL 11 0 - 40 mg/dL   LDL Cholesterol 123 (H) 0 - 99 mg/dL    Comment:        Total Cholesterol/HDL:CHD Risk Coronary Heart Disease Risk Table                     Men   Women  1/2 Average Risk   3.4   3.3  Average Risk       5.0   4.4  2 X Average Risk   9.6   7.1  3 X Average Risk  23.4   11.0        Use the calculated Patient Ratio above and the CHD Risk Table to determine the patient's CHD Risk.        ATP III CLASSIFICATION (LDL):  <100     mg/dL   Optimal  100-129  mg/dL   Near or Above                    Optimal  130-159  mg/dL   Borderline  160-189  mg/dL   High  >190     mg/dL   Very High Performed at [facility] [address], [place], [place] [address]   Magnesium     Status: Abnormal   Collection Time: 10/16/17  9:55 AM  Result Value Ref Range   Magnesium 1.3 (L) 1.7 - 2.4 mg/dL    Comment: Performed at [facility] [address], [place], [place] [address]  Surgical pcr screen     Status: None   Collection Time: 10/16/17  7:45 PM  Result Value Ref Range   MRSA, PCR NEGATIVE NEGATIVE   Staphylococcus aureus NEGATIVE NEGATIVE    Comment: (NOTE) The Xpert SA Assay (FDA approved for NASAL specimens in patients 65 years of age and older), is one component of a comprehensive surveillance program. It is not intended to diagnose infection nor to guide or monitor treatment. Performed at [facility], [place] [address] [place], [place] [address]   CBC     Status: Abnormal   Collection Time: 10/17/17  6:16 AM  Result Value Ref Range   WBC 9.9 4.0 - 10.5 K/uL    Comment: WHITE COUNT CONFIRMED ON SMEAR   RBC 3.38 (L) 3.87 - 5.11 MIL/uL   Hemoglobin 11.0 (L) 12.0 - 15.0 g/dL   HCT 31.5 (L) 36.0 - 46.0 %   MCV 93.2 78.0 - 100.0 fL   MCH 32.5 26.0 - 34.0 pg   MCHC 34.9 30.0 - 36.0 g/dL   RDW 13.2 11.5 - 15.5 %   Platelets 255 150 - 400 K/uL    Comment: Performed at [facility],  [place] [address] [place], [place] [address]  Comprehensive metabolic panel     Status: Abnormal   Collection Time: 10/17/17  6:16 AM  Result Value Ref Range   Sodium 138 135 - 145 mmol/L   Potassium 4.0 3.5 - 5.1 mmol/L    Comment: SLIGHT HEMOLYSIS   Chloride 107 101 - 111 mmol/L   CO2 23 22 - 32 mmol/L   Glucose, Bld 87 65 - 99 mg/dL   BUN 8 6 - 20 mg/dL   Creatinine, Ser 0.96 0.44 - 1.00 mg/dL   Calcium 8.8 (L) 8.9 - 10.3 mg/dL   Total Protein 6.8 6.5 -  8.1 g/dL   Albumin 2.9 (L) 3.5 - 5.0 g/dL   AST 47 (H) 15 - 41 U/L   ALT 34 14 - 54 U/L   Alkaline Phosphatase 71 38 - 126 U/L   Total Bilirubin 1.2 0.3 - 1.2 mg/dL   GFR calc non Af Amer 51 (L) >60 mL/min   GFR calc Af Amer 59 (L) >60 mL/min    Comment: (NOTE) The eGFR has been calculated using the CKD EPI equation. This calculation has not been validated in all clinical situations. eGFR's persistently <60 mL/min signify possible Chronic Kidney Disease.    Anion gap 8 5 - 15    Comment: Performed at [facility] [address], [place], [place] [address]  Lipase, blood     Status: None   Collection Time: 10/17/17  6:16 AM  Result Value Ref Range   Lipase 50 11 - 51 U/L    Comment: Performed at [facility] [address], [place], [place] [address]  Magnesium     Status: None   Collection Time: 10/17/17  6:16 AM  Result Value Ref Range   Magnesium 2.3 1.7 - 2.4 mg/dL    Comment: Performed at [facility] [address], [place], [place] [address]    Dg Cholangiogram Operative  Result Date: 10/17/2017 CLINICAL DATA:  84 year old female undergoing laparoscopic cholecystectomy EXAM: INTRAOPERATIVE CHOLANGIOGRAM TECHNIQUE: Cholangiographic images from the C-arm fluoroscopic device were submitted for interpretation post-operatively. Please see the procedural report for the amount of contrast and the fluoroscopy time utilized. COMPARISON:  Right upper quadrant ultrasound 10/15/2017 FINDINGS: A single  intraoperative image is submitted. The images depict cannulation of the cystic duct remanent and opacification of the biliary tree. There is mild dilatation of the distal common bile duct. Multiple faceted filling defects are present within the distal common bile duct consistent with choledocholithiasis. Contrast passes through the ampulla and into the duodenum. IMPRESSION: 1. Positive for nonobstructing choledocholithiasis. 2. Contrast material does pass the stones and enter the duodenum. Electronically Signed   By: Jacqulynn Cadet M.D.   On: 10/17/2017 12:27   US Abdomen Complete  Result Date: 10/15/2017 CLINICAL DATA:  Abdominal pain.  Pancreatitis.  Elevated lipase. EXAM: ABDOMEN ULTRASOUND COMPLETE COMPARISON:  Abdominal ultrasound 06/25/2016. FINDINGS: Gallbladder: A few small stones measuring up to 7 mm and sludge are seen in the gallbladder. No pericholecystic fluid or wall thickening. Sonographer reports negative Murphy's sign. Common bile duct: Diameter: 0.4 cm Liver: No focal lesion identified. Within normal limits in parenchymal echogenicity. Portal vein is patent on color Doppler imaging with normal direction of blood flow towards the liver. IVC: No abnormality visualized. Pancreas: Not visualized due to overlying bowel gas. Spleen: Size and appearance within normal limits. Right Kidney: Length: 12.6 cm. Echogenicity within normal limits. 5.2 cm cyst seen. No hydronephrosis visualized. Left Kidney: Length: 9.9 cm. Echogenicity within normal limits. 1.7 cm cyst seen. No hydronephrosis visualized. Abdominal aorta: No aneurysm visualized. Other findings: No free or focal fluid collection. IMPRESSION: Pancreas is not visualized. Small gallstones and gallbladder sludge. Negative for acute cholecystitis. Ectatic abdominal aorta at risk for aneurysm development. Recommend followup by ultrasound in 5 years. This recommendation follows ACR consensus guidelines: White Paper of the ACR Incidental Findings  Committee II on Vascular Findings. J Am Coll Radiol 2013; 10:789-794. Electronically Signed   By: Inge Rise M.D.   On: 10/15/2017 17:53    ROS negative except above Blood pressure (!) 143/66, pulse 96,  temperature 98.8 F (37.1 C), resp. rate 18, height _0  (1.6 m), weight 64.7 kg (142 lb 10.2 oz), SpO2 97 %. Physical Exam patient looks good postop not examined today will be prior to her procedure  Assessment/Plan: CBD stones on Intra-Op cholangiogram Plan: The risks benefits methods and success rate of ERCP and stone removal was discussed with the patient in multiple family members and my partner Dr. Raliegh Ip will proceed tomorrow at 10 AM with further workup and plans pending those findings  Clare E 10/17/2017, 5:30 PM

## 2017-10-17 NOTE — Progress Notes (Signed)
**Note De-Identified vi Obfusction** PROGRESS NOTE    Donna Burch  JOI:[id number] DOB: Jun 04, 1927 DOA: 10/15/2017 PCP: Hrln Stins, MD   Brief Nrrtive:   Donna Burch is  84 y.o. femle with medicl history significnt of CLL, rthritis, orthosttic hypotension, nd kidney stones comes in with  few hour history of bdominl pin.  She ws in [facility].  Tody while sitting on the couch, she developed shrp epigstric bdominl pin.  +belching, + nuse but no vomiting. Hd  norml bowel movement tody.  She hs known gllstones.  Hs seen [facility], most recently in 2018 but not sure wht ws recommended.  Denies new medictions, denies lcohol.  No judice.  Does not think she drinks enough wter  Found to hve pncretitis with gllstones on [place].  Presumed gllstone pncretitis.  Pln for surgery tody.  Assessment & Pln:   Active Problems:   CLL (chronic lymphocytic leukemi) (HCC)   Hypertension   GERD (gstroesophgel reflux disese)   Hypothyroidism   Pncretitis   Gllstone pncretitis   Gllstone Pncretitis:  No etoh or obvious mediction cuses (prilosec <1%).  With gllstones on [place], this seems to be most likely cuse.  Norml bili nd lk phos.  AST mildly elevted nd downtrending. Surgery c/s S/p cholecystectomy on 4/26 with IOC which ws positive for nonobstruting choledocholithisis GI hs been c/s by surgery Pin control  HTN: Uncontrolled right now.  Continue losrtn.  CLL: Follows with Dr. Irene Limbo  Hypothyroidism: Follows with Dr. Buddy Duty Not on meds.  Norml TSH.  Hypomgnesemi: low mg, replete.  Will strt PO.  Ecttic Abdominl Aort: outptient follow up  DVT prophylxis: lovenox Code Sttus: full  Fmily Communiction: dughter, son t bedside Disposition Pln: pending surgery   Consultnts:   Surgery  Procedures:   Lproscopic Cholecystectomy with IOC 10/17/17  Antimicrobils: Anti-infectives (From dmission, onwrd)   Strt     Dose/Rte  Route Frequency Ordered Stop   10/17/17 1500  pipercillin-tzobctm (ZOSYN) IVPB 3.375 g     3.375 g 12.5 mL/hr over 240 Minutes Intrvenous Every 8 hours 10/17/17 1409     10/17/17 0600  ceFAZolin (ANCEF) IVPB 2g/100 mL premix  Sttus:  Discontinued     2 g 200 mL/hr over 30 Minutes Intrvenous To ShortSty Surgicl 10/16/17 1541 10/17/17 1409      Subjective: No pin. Rolling down to surgery.  Objective: Vitls:   10/17/17 1313 10/17/17 1315 10/17/17 1345 10/17/17 1425  BP:  (!) 177/101 (!) 165/80 (!) 143/66  Pulse: 97 (!) 102 92 96  Resp:  (!) 22 18   Temp:  98.9 F (37.2 C) 98.8 F (37.1 C)   TempSrc:      SpO2:  97% 97% 97%  Weight:      Height:        Intke/Output Summry (Lst 24 hours) t 10/17/2017 1628 Lst dt filed t 10/17/2017 1345 Gross per 24 hour  Intke 1065 ml  Output 1130 ml  Net -65 ml   Filed Weights   10/15/17 2013 10/16/17 0233  Weight: 59 kg (130 lb) 64.7 kg (142 lb 10.2 oz)    Exmintion:  Generl: No cute distress. Crdiovsculr: Hert sounds show  regulr rte, nd rhythm. No gllops or rubs. No murmurs. No JVD. Lungs: Cler to usculttion bilterlly with good ir movement. No rles, rhonchi or wheezes. Abdomen: Soft, mildly tender to plption, nondistended with norml ctive bowel sounds. No msses. No heptosplenomegly. Neurologicl: Alert nd oriented 3. Moves ll extremities 4. Crnil nerves II through **Note De-Identified vi Obfusction** XII grossly intct. Skin: Wrm nd dry. No rshes or lesions. Extremities: No clubbing or cynosis. No edem. Pedl pulses 2+. Psychitric: Mood nd ffect re norml. Insight nd judgment re pproprite.    Dt Reviewed: I hve personlly reviewed following lbs nd imging studies  CBC: Recent Lbs  Lb 10/15/17 1342 10/16/17 0557 10/17/17 0616  WBC 12.1* 8.8 9.9  HGB 11.1* 10.5* 11.0*  HCT 33.6* 32.4* 31.5*  MCV 94.1 94.2 93.2  PLT 254 230 277   Bsic Metbolic Pnel: Recent Lbs  Lb 10/15/17 1342  10/15/17 1944 10/16/17 0557 10/16/17 0955 10/17/17 0616  N 134*  --  139  --  138  K 4.3  --  4.0  --  4.0  CL 103  --  109  --  107  CO2 22  --  22  --  23  GLUCOSE 113*  --  80  --  87  BUN 19  --  14  --  8  CRETININE 1.02*  --  0.92  --  0.96  CLCIUM 9.4  --  8.9  --  8.8*  MG  --  1.4*  --  1.3* 2.3   GFR: Estimted Cretinine Clernce: 36.6 mL/min (by [employer] formul bsed on SCr of 0.96 mg/dL). Liver Function Tests: Recent Lbs  Lb 10/15/17 1342 10/16/17 0557 10/17/17 0616  ST 85* 64* 47*  LT 37 52 34  LKPHOS 75 75 71  BILITOT 0.5 0.7 1.2  PROT 7.6 6.8 6.8  LBUMIN 3.3* 2.9* 2.9*   Recent Lbs  Lb 10/15/17 1342 10/16/17 0557 10/17/17 0616  LIPSE 1,442* 140* 50   No results for input(s): MMONI in the lst 168 hours. Cogultion Profile: No results for input(s): INR, PROTIME in the lst 168 hours. Crdic Enzymes: No results for input(s): CKTOTL, CKMB, CKMBINDEX, TROPONINI in the lst 168 hours. BNP (lst 3 results) No results for input(s): PROBNP in the lst 8760 hours. Hb1C: No results for input(s): HGB1C in the lst 72 hours. CBG: No results for input(s): GLUCP in the lst 168 hours. Lipid Profile: Recent Lbs    10/16/17 0557  CHOL 181  HDL 47  LDLCLC 123*  TRIG 53  CHOLHDL 3.9   Thyroid Function Tests: Recent Lbs    10/15/17 1944  TSH 1.925   nemi Pnel: No results for input(s): VITMINB12, FOLTE, FERRITIN, TIBC, IRON, RETICCTPCT in the lst 72 hours. Sepsis Lbs: No results for input(s): PROCLCITON, LTICCIDVEN in the lst 168 hours.  Recent Results (from the pst 240 hour(s))  Surgicl pcr screen     Sttus: None   Collection Time: 10/16/17  7:45 PM  Result Vlue Ref Rnge Sttus   MRS, PCR NEGTIVE NEGTIVE Finl   Stphylococcus ureus NEGTIVE NEGTIVE Finl    Comment: (NOTE) The Xpert S ssy (FD pproved for NSL specimens in ptients 22 yers of ge nd older), is one component of   comprehensive surveillnce progrm. It is not intended to dignose infection nor to guide or monitor tretment. Performed t [facility], [place] [address] [place], [place] [address]          Rdiology Studies: Dg Cholngiogrm Opertive  Result Dte: 10/17/2017 CLINICL DT:  56 yer old femle undergoing lproscopic cholecystectomy EXM: INTROPERTIVE CHOLNGIOGRM TECHNIQUE: Cholngiogrphic imges from the C-rm fluoroscopic device were submitted for interprettion post-opertively. Plese see the procedurl report for the mount of contrst nd the fluoroscopy time utilized. COMPRISON:  Right upper qudrnt ultrsound 10/15/2017 FINDINGS:  single intropertive imge is submitted. The images depict cannulation of the cystic duct remanent and opacification of the biliary tree. There is mild dilatation of the distal common bile duct. Multiple faceted filling defects are present within the distal common bile duct consistent with choledocholithiasis. Contrast passes through the ampulla and into the duodenum. IMPRESSION: 1. Positive for nonobstructing choledocholithiasis. 2. Contrast material does pass the stones and enter the duodenum. Electronically Signed   By: Jacqulynn Cadet M.D.   On: 10/17/2017 12:27   US Abdomen Complete  Result Date: 10/15/2017 CLINICAL DATA:  Abdominal pain.  Pancreatitis.  Elevated lipase. EXAM: ABDOMEN ULTRASOUND COMPLETE COMPARISON:  Abdominal ultrasound 06/25/2016. FINDINGS: Gallbladder: Boubacar Lerette few small stones measuring up to 7 mm and sludge are seen in the gallbladder. No pericholecystic fluid or wall thickening. Sonographer reports negative Murphy's sign. Common bile duct: Diameter: 0.4 cm Liver: No focal lesion identified. Within normal limits in parenchymal echogenicity. Portal vein is patent on color Doppler imaging with normal direction of blood flow towards the liver. IVC: No abnormality visualized. Pancreas: Not visualized due to overlying bowel gas.  Spleen: Size and appearance within normal limits. Right Kidney: Length: 12.6 cm. Echogenicity within normal limits. 5.2 cm cyst seen. No hydronephrosis visualized. Left Kidney: Length: 9.9 cm. Echogenicity within normal limits. 1.7 cm cyst seen. No hydronephrosis visualized. Abdominal aorta: No aneurysm visualized. Other findings: No free or focal fluid collection. IMPRESSION: Pancreas is not visualized. Small gallstones and gallbladder sludge. Negative for acute cholecystitis. Ectatic abdominal aorta at risk for aneurysm development. Recommend followup by ultrasound in 5 years. This recommendation follows ACR consensus guidelines: White Paper of the ACR Incidental Findings Committee II on Vascular Findings. J Am Coll Radiol 2013; 10:789-794. Electronically Signed   By: Inge Rise M.D.   On: 10/15/2017 17:53        Scheduled Meds: . [START ON 10/18/2017] enoxaparin (LOVENOX) injection  40 mg Subcutaneous Q24H  . losartan  100 mg Oral Daily  . magnesium chloride  1 tablet Oral BID   Continuous Infusions: . famotidine (PEPCID) IV Stopped (10/16/17 2145)  . lactated ringers    . piperacillin-tazobactam (ZOSYN)  IV       LOS: 1 day    Time spent: over 52 min    Fayrene Helper, MD [facility] Pager [phone]  If 7PM-7AM, please contact night-coverage [email or web address] Password [facility] 10/17/2017, 4:28 PM

## 2017-10-17 NOTE — Op Note (Addendum)
Operative Note  Donna Burch 84 y.o. female [id number]  10/17/2017  Surgeon: Clovis Riley MD  Assistant: Brigid Re PA-C  Procedure performed: Laparoscopic Cholecystectomy with intraoperative cholangiogram  Preop diagnosis: gallstone pancreatitis Post-op diagnosis/intraop findings: cholecystitis, choledocholithiasis  Specimens: gallbladder  EBL: minima  Complications: none  Description of procedure: After obtaining informed consent the patient was brought to the operating room. Prophylactic antibiotics and subcutaneous heparin were administered. SCD's were applied. General endotracheal anesthesia was initiated and a formal time-out was performed. The abdomen was prepped and draped in the usual sterile fashion and the abdomen was entered using an infraumbilical veress needle after instilling the site with local. Insufflation to 34mmHg was obtained and gross inspection revealed no evidence of injury from our entry. Known large hiatal hernia was visualized. Two 61mm trocars were introduced in the right midclavicular and right anterior axillary lines under direct visualization and following infiltration with local. An 40mm trocar was placed in the epigastrium. The gallbladder was inflamed and edematous. It was retracted cephalad and the infundibulum was retracted laterally. A combination of hook electrocautery and blunt dissection was utilized to clear the peritoneum from the neck and cystic duct, circumferentially isolating the cystic artery and cystic duct and lifting the gallbladder from the cystic plate. The critical view of safety was achieved with the cystic artery, cystic duct, and liver bed visualized between them with no other structures. The artery had a anterior and posterior branch each of which was clipped with a single clip proximally and distally and divided. A clip was placed on the distal cystic duct and a ductotomy created sharply. Thick, particulate bile refluxed from the  cystic duct. The [facility] catheter was introduced and secured within the cystic duct using a clip. A cholangiogram was performed which demonstrated appropriate anatomy, multiple filling defects in the distal common bile duct consistent with stones however the contrast did proceed down into the duodenum. There was some extravasation of contrast around the securing clip on the cystic duct. The clips are removed and the cholangiocatheter removed. 3 clips were placed on the proximal cystic duct and division completed. The gallbladder was dissected from the liver plate using electrocautery. Once freed the gallbladder was placed in an endocatch bag and removed intact through the epigastric trocar site. A small amount of bleeding on the liver bed was controlled with cautery. The right upper quadrant was aspirated and copiously; the effluent was clear. Hemostasis was once again confirmed, and reinspection of the abdomen revealed no injuries. The clips were well opposed without any bile leak from the duct or the liver bed. The 32mm trocar site in the epigastrium was closed with two 0 vicryls in the fascia under direct visualization using a PMI device. The abdomen was desufflated and all trocars removed. The skin incisions were closed with running subcuticular monocryl and Dermabond. The patient was awakened, extubated and transported to the recovery room in stable condition.  [facility] was consulted immediately following the case for ERCP given findings of choledocholithiasis.   All counts were correct at the completion of the case.

## 2017-10-17 NOTE — Anesthesia Procedure Notes (Signed)

## 2017-10-17 NOTE — Consult Note (Signed)
Reason for Consult: Abnormal IOC Referring Physician: Surgical team  Donna Burch is an 84 y.o. female.  HPI: Patient seen and examined in her hospital computer chart reviewed and our office computer chart reviewed and she looks good postop and is eating some solid food and her Intra-Op cholangiogram was reviewed and I discussed ERCP with her in multiple family members and currently other than her arthritis she has no complaints  Past Medical History:  Diagnosis Date  . Arthritis    "right middle finger; hands" (01/03/2015)  . CLL (chronic lymphocytic leukemia) ([facility])   . Dyspnea on exertion    Improved with exercise  . Elevated brain natriuretic peptide (BNP) level    Slight  . Family history of adverse reaction to anesthesia    son, Marita Snellen, w/PONV  . Frequent UTI    "used to; none lately" (01/03/2015)  . Gastroenteritis 09/2010   c. dificile.  hospitalized in ICU x 3 days  . GERD (gastroesophageal reflux disease)   . Hiatal hernia   . History of Clostridium difficile ?03/2012   "after taking ATB for one of my UTI's"  . Hyperlipidemia   . Hypertension   . Hypothyroidism   . Nephrolithiasis   . Nocturia    x3  . Pneumonia    Required hospital stay at the time  . PONV (postoperative nausea and vomiting)   . Renal insufficiency   . Seasonal allergies   . Skin cancer    right anterior neck  . Wears glasses     Past Surgical History:  Procedure Laterality Date  . CATARACT EXTRACTION, BILATERAL  2010  . COLONOSCOPY    . CYSTOSCOPY W/ STONE MANIPULATION Right   . CYSTOSCOPY WITH STENT PLACEMENT    . ESOPHAGOGASTRODUODENOSCOPY    . ESOPHAGOGASTRODUODENOSCOPY (EGD) WITH ESOPHAGEAL DILATION    . KNEE ARTHROSCOPY Left   . LITHOTRIPSY     "didn't work"  . SKIN CANCER EXCISION Right    anterior neck  . THYROID LOBECTOMY Right 01/03/2015  . THYROID LOBECTOMY Right 01/03/2015   Procedure: RIGHT THYROID LOBECTOMY;  Surgeon: Armandina Gemma, MD;  Location: [facility];  Service: General;   Laterality: Right;  . TONSILLECTOMY    . WISDOM TOOTH EXTRACTION      Family History  Problem Relation Age of Onset  . Heart attack Mother   . Hyperlipidemia Mother   . Asthma Father   . Diabetes Father     Social History:  reports that she has never smoked. She has never used smokeless tobacco. She reports that she does not drink alcohol or use drugs.  Allergies:  Allergies  Allergen Reactions  . Prednisone Other (See Comments)    "Jittery"  . Reclast [Zoledronic Acid] Other (See Comments)    Pt did not have a memory, from the day after she took that medication   . Hydrochlorothiazide Other (See Comments)  . Lisinopril Cough    Medications: I have reviewed the patient's current medications.  Results for orders placed or performed during the hospital encounter of 10/15/17 (from the past 48 hour(s))  Urinalysis, Routine w reflex microscopic     Status: Abnormal   Collection Time: 10/15/17  7:23 PM  Result Value Ref Range   Color, Urine YELLOW YELLOW   APPearance HAZY (A) CLEAR   Specific Gravity, Urine 1.005 1.005 - 1.030   pH 6.0 5.0 - 8.0   Glucose, UA NEGATIVE NEGATIVE mg/dL   Hgb urine dipstick NEGATIVE NEGATIVE   Bilirubin Urine NEGATIVE  NEGATIVE   Ketones, ur NEGATIVE NEGATIVE mg/dL   Protein, ur NEGATIVE NEGATIVE mg/dL   Nitrite NEGATIVE NEGATIVE   Leukocytes, UA MODERATE (A) NEGATIVE   RBC / HPF 0-5 0 - 5 RBC/hpf   WBC, UA 11-20 0 - 5 WBC/hpf   Bacteria, UA NONE SEEN NONE SEEN   Mucus PRESENT     Comment: Performed at [facility] [address] [place], [place] [address]  TSH     Status: None   Collection Time: 10/15/17  7:44 PM  Result Value Ref Range   TSH 1.925 0.350 - 4.500 uIU/mL    Comment: Performed by a 3rd Generation assay with a functional sensitivity of <=0.01 uIU/mL. Performed at [facility], [place] [address] [place], [place] [address]   Magnesium     Status: Abnormal   Collection Time: 10/15/17  7:44 PM  Result Value Ref  Range   Magnesium 1.4 (L) 1.7 - 2.4 mg/dL    Comment: Performed at [facility] [address], [place], [place] [address]  CBC     Status: Abnormal   Collection Time: 10/16/17  5:57 AM  Result Value Ref Range   WBC 8.8 4.0 - 10.5 K/uL   RBC 3.44 (L) 3.87 - 5.11 MIL/uL   Hemoglobin 10.5 (L) 12.0 - 15.0 g/dL   HCT 32.4 (L) 36.0 - 46.0 %   MCV 94.2 78.0 - 100.0 fL   MCH 30.5 26.0 - 34.0 pg   MCHC 32.4 30.0 - 36.0 g/dL   RDW 13.3 11.5 - 15.5 %   Platelets 230 150 - 400 K/uL    Comment: Performed at [facility], [place] [address] [place], [place] [address]  Comprehensive metabolic panel     Status: Abnormal   Collection Time: 10/16/17  5:57 AM  Result Value Ref Range   Sodium 139 135 - 145 mmol/L   Potassium 4.0 3.5 - 5.1 mmol/L   Chloride 109 101 - 111 mmol/L   CO2 22 22 - 32 mmol/L   Glucose, Bld 80 65 - 99 mg/dL   BUN 14 6 - 20 mg/dL   Creatinine, Ser 0.92 0.44 - 1.00 mg/dL   Calcium 8.9 8.9 - 10.3 mg/dL   Total Protein 6.8 6.5 - 8.1 g/dL   Albumin 2.9 (L) 3.5 - 5.0 g/dL   AST 64 (H) 15 - 41 U/L   ALT 52 14 - 54 U/L   Alkaline Phosphatase 75 38 - 126 U/L   Total Bilirubin 0.7 0.3 - 1.2 mg/dL   GFR calc non Af Amer 54 (L) >60 mL/min   GFR calc Af Amer >60 >60 mL/min    Comment: (NOTE) The eGFR has been calculated using the CKD EPI equation. This calculation has not been validated in all clinical situations. eGFR's persistently <60 mL/min signify possible Chronic Kidney Disease.    Anion gap 8 5 - 15    Comment: Performed at [facility] [address], [place], [place] [address]  Lipase, blood     Status: Abnormal   Collection Time: 10/16/17  5:57 AM  Result Value Ref Range   Lipase 140 (H) 11 - 51 U/L    Comment: Performed at [facility] [address] [place], [place] [address]  Lipid panel     Status: Abnormal   Collection Time: 10/16/17  5:57 AM  Result Value Ref Range   Cholesterol 181 0 - 200 mg/dL   Triglycerides  53 <150 mg/dL    HDL 47 >40 mg/dL   Total CHOL/HDL Ratio 3.9 RATIO   VLDL 11 0 - 40 mg/dL   LDL Cholesterol 123 (H) 0 - 99 mg/dL    Comment:        Total Cholesterol/HDL:CHD Risk Coronary Heart Disease Risk Table                     Men   Women  1/2 Average Risk   3.4   3.3  Average Risk       5.0   4.4  2 X Average Risk   9.6   7.1  3 X Average Risk  23.4   11.0        Use the calculated Patient Ratio above and the CHD Risk Table to determine the patient's CHD Risk.        ATP III CLASSIFICATION (LDL):  <100     mg/dL   Optimal  100-129  mg/dL   Near or Above                    Optimal  130-159  mg/dL   Borderline  160-189  mg/dL   High  >190     mg/dL   Very High Performed at [facility] [address], [place], [place] [address]   Magnesium     Status: Abnormal   Collection Time: 10/16/17  9:55 AM  Result Value Ref Range   Magnesium 1.3 (L) 1.7 - 2.4 mg/dL    Comment: Performed at [facility] [address], [place], [place] [address]  Surgical pcr screen     Status: None   Collection Time: 10/16/17  7:45 PM  Result Value Ref Range   MRSA, PCR NEGATIVE NEGATIVE   Staphylococcus aureus NEGATIVE NEGATIVE    Comment: (NOTE) The Xpert SA Assay (FDA approved for NASAL specimens in patients 65 years of age and older), is one component of a comprehensive surveillance program. It is not intended to diagnose infection nor to guide or monitor treatment. Performed at [facility], [place] [address] [place], [place] [address]   CBC     Status: Abnormal   Collection Time: 10/17/17  6:16 AM  Result Value Ref Range   WBC 9.9 4.0 - 10.5 K/uL    Comment: WHITE COUNT CONFIRMED ON SMEAR   RBC 3.38 (L) 3.87 - 5.11 MIL/uL   Hemoglobin 11.0 (L) 12.0 - 15.0 g/dL   HCT 31.5 (L) 36.0 - 46.0 %   MCV 93.2 78.0 - 100.0 fL   MCH 32.5 26.0 - 34.0 pg   MCHC 34.9 30.0 - 36.0 g/dL   RDW 13.2 11.5 - 15.5 %   Platelets 255 150 - 400 K/uL    Comment: Performed at [facility],  [place] [address] [place], [place] [address]  Comprehensive metabolic panel     Status: Abnormal   Collection Time: 10/17/17  6:16 AM  Result Value Ref Range   Sodium 138 135 - 145 mmol/L   Potassium 4.0 3.5 - 5.1 mmol/L    Comment: SLIGHT HEMOLYSIS   Chloride 107 101 - 111 mmol/L   CO2 23 22 - 32 mmol/L   Glucose, Bld 87 65 - 99 mg/dL   BUN 8 6 - 20 mg/dL   Creatinine, Ser 0.96 0.44 - 1.00 mg/dL   Calcium 8.8 (L) 8.9 - 10.3 mg/dL   Total Protein 6.8 6.5 -  8.1 g/dL   Albumin 2.9 (L) 3.5 - 5.0 g/dL   AST 47 (H) 15 - 41 U/L   ALT 34 14 - 54 U/L   Alkaline Phosphatase 71 38 - 126 U/L   Total Bilirubin 1.2 0.3 - 1.2 mg/dL   GFR calc non Af Amer 51 (L) >60 mL/min   GFR calc Af Amer 59 (L) >60 mL/min    Comment: (NOTE) The eGFR has been calculated using the CKD EPI equation. This calculation has not been validated in all clinical situations. eGFR's persistently <60 mL/min signify possible Chronic Kidney Disease.    Anion gap 8 5 - 15    Comment: Performed at [facility] [address], [place], [place] [address]  Lipase, blood     Status: None   Collection Time: 10/17/17  6:16 AM  Result Value Ref Range   Lipase 50 11 - 51 U/L    Comment: Performed at [facility] [address], [place], [place] [address]  Magnesium     Status: None   Collection Time: 10/17/17  6:16 AM  Result Value Ref Range   Magnesium 2.3 1.7 - 2.4 mg/dL    Comment: Performed at [facility] [address], [place], [place] [address]    Dg Cholangiogram Operative  Result Date: 10/17/2017 CLINICAL DATA:  84 year old female undergoing laparoscopic cholecystectomy EXAM: INTRAOPERATIVE CHOLANGIOGRAM TECHNIQUE: Cholangiographic images from the C-arm fluoroscopic device were submitted for interpretation post-operatively. Please see the procedural report for the amount of contrast and the fluoroscopy time utilized. COMPARISON:  Right upper quadrant ultrasound 10/15/2017 FINDINGS: A single  intraoperative image is submitted. The images depict cannulation of the cystic duct remanent and opacification of the biliary tree. There is mild dilatation of the distal common bile duct. Multiple faceted filling defects are present within the distal common bile duct consistent with choledocholithiasis. Contrast passes through the ampulla and into the duodenum. IMPRESSION: 1. Positive for nonobstructing choledocholithiasis. 2. Contrast material does pass the stones and enter the duodenum. Electronically Signed   By: Jacqulynn Cadet M.D.   On: 10/17/2017 12:27   US Abdomen Complete  Result Date: 10/15/2017 CLINICAL DATA:  Abdominal pain.  Pancreatitis.  Elevated lipase. EXAM: ABDOMEN ULTRASOUND COMPLETE COMPARISON:  Abdominal ultrasound 06/25/2016. FINDINGS: Gallbladder: A few small stones measuring up to 7 mm and sludge are seen in the gallbladder. No pericholecystic fluid or wall thickening. Sonographer reports negative Murphy's sign. Common bile duct: Diameter: 0.4 cm Liver: No focal lesion identified. Within normal limits in parenchymal echogenicity. Portal vein is patent on color Doppler imaging with normal direction of blood flow towards the liver. IVC: No abnormality visualized. Pancreas: Not visualized due to overlying bowel gas. Spleen: Size and appearance within normal limits. Right Kidney: Length: 12.6 cm. Echogenicity within normal limits. 5.2 cm cyst seen. No hydronephrosis visualized. Left Kidney: Length: 9.9 cm. Echogenicity within normal limits. 1.7 cm cyst seen. No hydronephrosis visualized. Abdominal aorta: No aneurysm visualized. Other findings: No free or focal fluid collection. IMPRESSION: Pancreas is not visualized. Small gallstones and gallbladder sludge. Negative for acute cholecystitis. Ectatic abdominal aorta at risk for aneurysm development. Recommend followup by ultrasound in 5 years. This recommendation follows ACR consensus guidelines: White Paper of the ACR Incidental Findings  Committee II on Vascular Findings. J Am Coll Radiol 2013; 10:789-794. Electronically Signed   By: Inge Rise M.D.   On: 10/15/2017 17:53    ROS negative except above Blood pressure (!) 143/66, pulse 96,  temperature 98.8 F (37.1 C), resp. rate 18, height _0  (1.6 m), weight 64.7 kg (142 lb 10.2 oz), SpO2 97 %. Physical Exam patient looks good postop not examined today will be prior to her procedure  Assessment/Plan: CBD stones on Intra-Op cholangiogram Plan: The risks benefits methods and success rate of ERCP and stone removal was discussed with the patient in multiple family members and my partner Dr. Raliegh Ip will proceed tomorrow at 10 AM with further workup and plans pending those findings  Clare E 10/17/2017, 5:30 PM

## 2017-10-18 ENCOUNTER — Encounter (HOSPITAL_COMMUNITY)
Admission: EM | Disposition: A | Discharge status: Home Health | Payer: Self-pay | Source: Home / Self Care | Attending: Family Medicine

## 2017-10-18 ENCOUNTER — Inpatient Hospital Stay (HOSPITAL_COMMUNITY): Payer: Medicare Other | Admitting: Certified Registered Nurse Anesthetist

## 2017-10-18 ENCOUNTER — Encounter (HOSPITAL_COMMUNITY): Payer: Self-pay | Admitting: Surgery

## 2017-10-18 ENCOUNTER — Inpatient Hospital Stay (HOSPITAL_COMMUNITY): Payer: Medicare Other

## 2017-10-18 DIAGNOSIS — K8050 Calculus of bile duct without cholangitis or cholecystitis without obstruction: Secondary | ICD-10-CM

## 2017-10-18 HISTORY — PX: ERCP: SHX5425

## 2017-10-18 LAB — COMPREHENSIVE METABOLIC PANEL
ALT: 512 U/L — AB (ref 14–54)
AST: 718 U/L — AB (ref 15–41)
Albumin: 2.5 g/dL — ABNORMAL LOW (ref 3.5–5.0)
Alkaline Phosphatase: 154 U/L — ABNORMAL HIGH (ref 38–126)
Anion gap: 10 (ref 5–15)
BILIRUBIN TOTAL: 2 mg/dL — AB (ref 0.3–1.2)
BUN: 18 mg/dL (ref 6–20)
CO2: 23 mmol/L (ref 22–32)
CREATININE: 1.47 mg/dL — AB (ref 0.44–1.00)
Calcium: 8.4 mg/dL — ABNORMAL LOW (ref 8.9–10.3)
Chloride: 101 mmol/L (ref 101–111)
GFR calc Af Amer: 36 mL/min — ABNORMAL LOW (ref >60)
GFR, EST NON AFRICAN AMERICAN: 31 mL/min — AB (ref >60)
GLUCOSE: 110 mg/dL — AB (ref 65–99)
Potassium: 3.6 mmol/L (ref 3.5–5.1)
Sodium: 134 mmol/L — ABNORMAL LOW (ref 135–145)
Total Protein: 6.5 g/dL (ref 6.5–8.1)

## 2017-10-18 LAB — URINALYSIS, ROUTINE W REFLEX MICROSCOPIC
BACTERIA UA: NONE SEEN
Bilirubin Urine: NEGATIVE
GLUCOSE, UA: NEGATIVE mg/dL
Hgb urine dipstick: NEGATIVE
Ketones, ur: NEGATIVE mg/dL
Nitrite: NEGATIVE
Protein, ur: NEGATIVE mg/dL
SPECIFIC GRAVITY, URINE: 1.014 (ref 1.005–1.030)
pH: 6 (ref 5.0–8.0)

## 2017-10-18 LAB — CBC
HCT: 30.3 % — ABNORMAL LOW (ref 36.0–46.0)
Hemoglobin: 10.3 g/dL — ABNORMAL LOW (ref 12.0–15.0)
MCH: 31.9 pg (ref 26.0–34.0)
MCHC: 34 g/dL (ref 30.0–36.0)
MCV: 93.8 fL (ref 78.0–100.0)
PLATELETS: 184 10*3/uL (ref 150–400)
RBC: 3.23 MIL/uL — ABNORMAL LOW (ref 3.87–5.11)
RDW: 13.5 % (ref 11.5–15.5)
WBC: 22.5 10*3/uL — AB (ref 4.0–10.5)

## 2017-10-18 SURGERY — ERCP, WITH INTERVENTION IF INDICATED
Anesthesia: General

## 2017-10-18 MED ORDER — PHENYLEPHRINE HCL 10 MG/ML IJ SOLN
INTRAMUSCULAR | Status: DC | PRN
  Administered 2017-10-18: 80 ug via INTRAVENOUS

## 2017-10-18 MED ORDER — LACTATED RINGERS IV SOLN
INTRAVENOUS | Status: DC | PRN
  Administered 2017-10-18 (×2): via INTRAVENOUS

## 2017-10-18 MED ORDER — EPHEDRINE SULFATE 50 MG/ML IJ SOLN
INTRAMUSCULAR | Status: DC | PRN
  Administered 2017-10-18 (×2): 10 mg via INTRAVENOUS

## 2017-10-18 MED ORDER — GLUCAGON HCL RDNA (DIAGNOSTIC) 1 MG IJ SOLR
INTRAMUSCULAR | Status: AC
  Filled 2017-10-18: qty 1

## 2017-10-18 MED ORDER — IOPAMIDOL (ISOVUE-300) INJECTION 61%
INTRAVENOUS | Status: AC
  Filled 2017-10-18: qty 50

## 2017-10-18 MED ORDER — ROCURONIUM BROMIDE 10 MG/ML (PF) SYRINGE
PREFILLED_SYRINGE | INTRAVENOUS | Status: DC | PRN
  Administered 2017-10-18: 40 mg via INTRAVENOUS

## 2017-10-18 MED ORDER — LIDOCAINE 2% (20 MG/ML) 5 ML SYRINGE
INTRAMUSCULAR | Status: DC | PRN
  Administered 2017-10-18: 60 mg via INTRAVENOUS

## 2017-10-18 MED ORDER — SODIUM CHLORIDE 0.9 % IV SOLN
INTRAVENOUS | Status: DC | PRN
  Administered 2017-10-18: 30 mL

## 2017-10-18 MED ORDER — INDOMETHACIN 50 MG RE SUPP
RECTAL | Status: AC
  Filled 2017-10-18: qty 2

## 2017-10-18 MED ORDER — IOPAMIDOL (ISOVUE-300) INJECTION 61%
INTRAVENOUS | Status: AC
  Filled 2017-10-18: qty 100

## 2017-10-18 MED ORDER — LACTATED RINGERS IV SOLN
INTRAVENOUS | Status: DC
  Administered 2017-10-18: 15:00:00 via INTRAVENOUS

## 2017-10-18 MED ORDER — PROPOFOL 10 MG/ML IV BOLUS
INTRAVENOUS | Status: DC | PRN
  Administered 2017-10-18: 80 mg via INTRAVENOUS

## 2017-10-18 MED ORDER — FAMOTIDINE IN NACL 20-0.9 MG/50ML-% IV SOLN
20.0000 mg | INTRAVENOUS | Status: DC
  Administered 2017-10-19: 20 mg via INTRAVENOUS
  Filled 2017-10-18: qty 50

## 2017-10-18 MED ORDER — FENTANYL CITRATE (PF) 250 MCG/5ML IJ SOLN
INTRAMUSCULAR | Status: DC | PRN
  Administered 2017-10-18: 100 ug via INTRAVENOUS

## 2017-10-18 MED ORDER — ENOXAPARIN SODIUM 30 MG/0.3ML ~~LOC~~ SOLN
30.0000 mg | SUBCUTANEOUS | Status: DC
  Administered 2017-10-18: 30 mg via SUBCUTANEOUS
  Filled 2017-10-18 (×2): qty 0.3

## 2017-10-18 MED ORDER — SUGAMMADEX SODIUM 200 MG/2ML IV SOLN
INTRAVENOUS | Status: DC | PRN
  Administered 2017-10-18: 200 mg via INTRAVENOUS

## 2017-10-18 MED ORDER — ONDANSETRON HCL 4 MG/2ML IJ SOLN
INTRAMUSCULAR | Status: DC | PRN
  Administered 2017-10-18: 4 mg via INTRAVENOUS

## 2017-10-18 NOTE — Discharge Instructions (Signed)
Please arrive at least 30 min before your appointment to complete your check in paperwork.  If you are unable to arrive 30 min prior to your appointment time we may have to cancel or reschedule you. ° °LAPAROSCOPIC SURGERY: POST OP INSTRUCTIONS  °1. DIET: Follow a light bland diet the first 24 hours after arrival home, such as soup, liquids, crackers, etc. Be sure to include lots of fluids daily. Avoid fast food or heavy meals as your are more likely to get nauseated. Eat a low fat the next few days after surgery.  °2. Take your usually prescribed home medications unless otherwise directed. °3. PAIN CONTROL:  °1. Pain is best controlled by a usual combination of three different methods TOGETHER:  °1. Ice/Heat °2. Over the counter pain medication °3. Prescription pain medication °2. Most patients will experience some swelling and bruising around the incisions. Ice packs or heating pads (30-60 minutes up to 6 times a day) will help. Use ice for the first few days to help decrease swelling and bruising, then switch to heat to help relax tight/sore spots and speed recovery. Some people prefer to use ice alone, heat alone, alternating between ice & heat. Experiment to what works for you. Swelling and bruising can take several weeks to resolve.  °3. It is helpful to take an over-the-counter pain medication regularly for the first few weeks. Choose one of the following that works best for you:  °1. Naproxen (Aleve, etc) Two 220mg tabs twice a day °2. Ibuprofen (Advil, etc) Three 200mg tabs four times a day (every meal & bedtime) °3. Acetaminophen (Tylenol, etc) 500-650mg four times a day (every meal & bedtime) °4. A prescription for pain medication (such as oxycodone, hydrocodone, etc) should be given to you upon discharge. Take your pain medication as prescribed.  °1. If you are having problems/concerns with the prescription medicine (does not control pain, nausea, vomiting, rash, itching, etc), please call us [phone]  [phone] to see if we need to switch you to a different pain medicine that will work better for you and/or control your side effect better. °2. If you need a refill on your pain medication, please contact your pharmacy. They will contact our office to request authorization. Prescriptions will not be filled after 5 pm or on week-ends. °4. Avoid getting constipated. Between the surgery and the pain medications, it is common to experience some constipation. Increasing fluid intake and taking a fiber supplement (such as Metamucil, Citrucel, FiberCon, MiraLax, etc) 1-2 times a day regularly will usually help prevent this problem from occurring. A mild laxative (prune juice, Milk of Magnesia, MiraLax, etc) should be taken according to package directions if there are no bowel movements after 48 hours.  °5. Watch out for diarrhea. If you have many loose bowel movements, simplify your diet to bland foods & liquids for a few days. Stop any stool softeners and decrease your fiber supplement. Switching to mild anti-diarrheal medications (Kayopectate, Pepto Bismol) can help. If this worsens or does not improve, please call us. °6. Wash / shower every day. You may shower over the dressings as they are waterproof. Continue to shower over incision(s) after the dressing is off. °7. Remove your waterproof bandages 5 days after surgery. You may leave the incision open to air. You may replace a dressing/Band-Aid to cover the incision for comfort if you wish.  °8. ACTIVITIES as tolerated:  °1. You may resume regular (light) daily activities beginning the next day--such as daily self-care, walking, climbing stairs--gradually   increasing activities as tolerated. If you can walk 30 minutes without difficulty, it is safe to try more intense activity such as jogging, treadmill, bicycling, low-impact aerobics, swimming, etc. °2. Save the most intensive and strenuous activity for last such as sit-ups, heavy lifting, contact sports, etc Refrain  from any heavy lifting or straining until you are off narcotics for pain control.  °3. DO NOT PUSH THROUGH PAIN. Let pain be your guide: If it hurts to do something, don't do it. Pain is your body warning you to avoid that activity for another week until the pain goes down. °4. You may drive when you are no longer taking prescription pain medication, you can comfortably wear a seatbelt, and you can safely maneuver your car and apply brakes. °5. You may have sexual intercourse when it is comfortable.  °9. FOLLOW UP in our office  °1. Please call [facility] at [phone] to set up an appointment to see your surgeon in the office for a follow-up appointment approximately 2-3 weeks after your surgery. °2. Make sure that you call for this appointment the day you arrive home to insure a convenient appointment time. °     10. IF YOU HAVE DISABILITY OR FAMILY LEAVE FORMS, BRING THEM TO THE               OFFICE FOR PROCESSING.  ° °WHEN TO CALL US [phone]:  °1. Poor pain control °2. Reactions / problems with new medications (rash/itching, nausea, etc)  °3. Fever over 101.5 F (38.5 C) °4. Inability to urinate °5. Nausea and/or vomiting °6. Worsening swelling or bruising °7. Continued bleeding from incision. °8. Increased pain, redness, or drainage from the incision ° °The clinic staff is available to answer your questions during regular business hours (8:30am-5pm). Please don’t hesitate to call and ask to speak to one of our nurses for clinical concerns.  °If you have a medical emergency, go to the nearest emergency room or call 911.  °A surgeon from [facility] is always on call at the hospitals  ° °[facility]  °[address], [address], [place], [place] [address] ?  °MAIN: [phone] ? TOLL FREE: [phone] ?  °FAX [phone]  °[email or web address] ° °

## 2017-10-18 NOTE — Anesthesia Procedure Notes (Signed)
Procedure Name: Intubation Date/Time: 10/18/2017 12:25 PM Performed by: Clearnce Sorrel, CRNA Pre-anesthesia Checklist: Patient identified, Emergency Drugs available, Suction available, Patient being monitored and Timeout performed Patient Re-evaluated:Patient Re-evaluated prior to induction Oxygen Delivery Method: Circle system utilized Preoxygenation: Pre-oxygenation with 100% oxygen Induction Type: IV induction Ventilation: Mask ventilation without difficulty Laryngoscope Size: Mac and 3 Grade View: Grade II Tube type: Oral Tube size: 7.0 mm Number of attempts: 1 Airway Equipment and Method: Stylet Placement Confirmation: ETT inserted through vocal cords under direct vision,  positive ETCO2 and breath sounds checked- equal and bilateral Secured at: 22 cm Tube secured with: Tape Dental Injury: Teeth and Oropharynx as per pre-operative assessment

## 2017-10-18 NOTE — Op Note (Signed)
[facility] Patient Name: Donna Burch Procedure Date : 10/18/2017 MRN: [id number] Attending MD: Ronnette Juniper , MD Date of Birth: 25-Apr-1927 CSN: [id number] Age: 84 Admit Type: Inpatient Procedure:                ERCP Indications:              Common bile duct stone(s) Providers:                Ronnette Juniper, MD, Kingsley Plan, RN, Elna Breslow,                            RN, Nevin Bloodgood, Technician, Clearnce Sorrel, CRNA Referring MD:              Medicines:                Monitored Anesthesia Care Complications:            No immediate complications. Estimated Blood Loss:     Estimated blood loss: none. Procedure:                Pre-Anesthesia Assessment:                           - Prior to the procedure, a History and Physical                            was performed, and patient medications and                            allergies were reviewed. The patient's tolerance of                            previous anesthesia was also reviewed. The risks                            and benefits of the procedure and the sedation                            options and risks were discussed with the patient.                            All questions were answered, and informed consent                            was obtained. Prior Anticoagulants: The patient has                            taken no previous anticoagulant or antiplatelet                            agents. ASA Grade Assessment: II - A patient with                            mild systemic disease. After reviewing the risks  and benefits, the patient was deemed in                            satisfactory condition to undergo the procedure.                           After obtaining informed consent, the scope was                            passed under direct vision. Throughout the                            procedure, the patient's blood pressure, pulse, and                            oxygen  saturations were monitored continuously. The                            [id number] scope was introduced through the                            mouth, and used to inject contrast into and used to                            cannulate the bile duct. The ERCP was accomplished                            without difficulty. The patient tolerated the                            procedure well. Scope In: Scope Out: Findings:      The scout film was normal. The esophagus was successfully intubated       under direct vision. The scope was advanced to a normal major papilla in       the descending duodenum without detailed examination of the pharynx,       larynx and associated structures, and upper GI tract. The upper GI tract       was grossly normal. The bile duct was deeply cannulated with the       sphincterotome. Contrast was injected. I personally interpreted the bile       duct images. There was brisk flow of contrast through the ducts. Image       quality was excellent. Contrast extended to the entire biliary tree.       Opacification of the lower third of the main bile duct was successful.       The maximum diameter of the ducts was 10 mm. The lower third of the main       bile duct contained multiple stones, the largest of which was 7 mm in       diameter. A straight Roadrunner wire was passed into the biliary tree. A       12 mm biliary sphincterotomy was made with a braided sphincterotome       using ERBE electrocautery. There was no post-sphincterotomy bleeding.       The biliary tree was swept with a 12  mm balloon starting at the       bifurcation. Many stones(at least 7) were removed. No stones remained.       The ampulla was located next to a small duodenal diverticulum and       appeared unremarkable. Impression:               - Choledocholithiasis was found. Complete removal                            was accomplished by biliary sphincterotomy and                             balloon extraction.                           - A biliary sphincterotomy was performed.                           - The biliary tree was swept. Recommendation:           - Clear liquid diet. Procedure Code(s):        --- Professional ---                           [phone], Endoscopic retrograde                            cholangiopancreatography (ERCP); with removal of                            calculi/debris from biliary/pancreatic duct(s)                           [id number], Endoscopic retrograde                            cholangiopancreatography (ERCP); with                            sphincterotomy/papillotomy                           [phone], Endoscopic catheterization of the biliary                            ductal system, radiological supervision and                            interpretation Diagnosis Code(s):        --- Professional ---                           K80.50, Calculus of bile duct without cholangitis                            or cholecystitis without obstruction CPT copyright 2017 American Medical Association. All rights reserved. The codes documented in this report are preliminary and upon coder review may  be revised to meet current compliance requirements. Ronnette Juniper,  MD 10/18/2017 12:59:55 PM This report has been signed electronically. Number of Addenda: 0

## 2017-10-18 NOTE — Anesthesia Postprocedure Evaluation (Signed)
Anesthesia Post Note  Patient: Donna Burch  Procedure(s) Performed: ENDOSCOPIC RETROGRADE CHOLANGIOPANCREATOGRAPHY (ERCP) (N/A )     Patient location during evaluation: Endoscopy Anesthesia Type: General Level of consciousness: awake and alert Pain management: pain level controlled Vital Signs Assessment: post-procedure vital signs reviewed and stable Respiratory status: spontaneous breathing, nonlabored ventilation, respiratory function stable and patient connected to nasal cannula oxygen Cardiovascular status: blood pressure returned to baseline and stable Postop Assessment: no apparent nausea or vomiting Anesthetic complications: no    Last Vitals:  Vitals:   10/18/17 1330 10/18/17 1408  BP: (!) 132/40 129/62  Pulse: 88 82  Resp: (!) 25 17  Temp:  36.4 C  SpO2: 97% 99%    Last Pain:  Vitals:   10/18/17 1408  TempSrc: Oral  PainSc:                  Arleny Kruger

## 2017-10-18 NOTE — Transfer of Care (Signed)
Immediate Anesthesia Transfer of Care Note  Patient: Donna Burch  Procedure(s) Performed: ENDOSCOPIC RETROGRADE CHOLANGIOPANCREATOGRAPHY (ERCP) (N/A )  Patient Location: EDO  Anesthesia Type:General  Level of Consciousness: awake, alert  and oriented  Airway & Oxygen Therapy: Patient Spontanous Breathing and Patient connected to nasal cannula oxygen  Post-op Assessment: Report given to RN and Post -op Vital signs reviewed and stable  Post vital signs: Reviewed and stable  Last Vitals:  Vitals Value Taken Time  BP 139/47 10/18/2017  1:10 PM  Temp 36.4 C 10/18/2017  1:10 PM  Pulse 86 10/18/2017  1:12 PM  Resp 19 10/18/2017  1:12 PM  SpO2 97 % 10/18/2017  1:12 PM  Vitals shown include unvalidated device data.  Last Pain:  Vitals:   10/18/17 1310  TempSrc: Oral  PainSc: 0-No pain      Patients Stated Pain Goal: 3 (56/31/49 7026)  Complications: No apparent anesthesia complications

## 2017-10-18 NOTE — Brief Op Note (Signed)
10/15/2017 - 10/18/2017  1:00 PM  PATIENT:  Donna Burch  84 y.o. female  PRE-OPERATIVE DIAGNOSIS:  CBD stones  POST-OPERATIVE DIAGNOSIS:  spincherotomy, balloon sweep, stone removal (7 stones)  PROCEDURE:  Procedure(s): ENDOSCOPIC RETROGRADE CHOLANGIOPANCREATOGRAPHY (ERCP) (N/A)  SURGEON:  Surgeon(s) and Role:    Ronnette Juniper, MD - Primary  PHYSICIAN ASSISTANT:   ASSISTANTS: Carlyn Reichert, RN, Elna Breslow, RN  ANESTHESIA:   MAC  EBL:  None  BLOOD ADMINISTERED:none  DRAINS: none   LOCAL MEDICATIONS USED:  NONE  SPECIMEN:  No Specimen  DISPOSITION OF SPECIMEN:  N/A  COUNTS:  YES  TOURNIQUET:  * No tourniquets in log *  DICTATION: .Dragon Dictation  PLAN OF CARE: Admit to inpatient   PATIENT DISPOSITION:  PACU - hemodynamically stable.   Delay start of Pharmacological VTE agent (>24hrs) due to surgical blood loss or risk of bleeding: no

## 2017-10-18 NOTE — Anesthesia Preprocedure Evaluation (Signed)
Anesthesia Evaluation  Patient identified by MRN, date of birth, ID band Patient awake    Reviewed: Allergy & Precautions, NPO status , Patient's Chart, lab work & pertinent test results  History of Anesthesia Complications (+) PONV and history of anesthetic complications  Airway Mallampati: II  TM Distance: >3 FB Neck ROM: Full    Dental  (+) Teeth Intact, Chipped, Dental Advisory Given,    Pulmonary pneumonia,    breath sounds clear to auscultation       Cardiovascular hypertension, Pt. on medications (-) angina(-) Past MI and (-) CHF  Rhythm:Regular Rate:Normal     Neuro/Psych  Neuromuscular disease    GI/Hepatic hiatal hernia, GERD  ,  Endo/Other  Hypothyroidism   Renal/GU Renal disease     Musculoskeletal  (+) Arthritis ,   Abdominal   Peds  Hematology   Anesthesia Other Findings   Reproductive/Obstetrics                             Anesthesia Physical Anesthesia Plan  ASA: II  Anesthesia Plan: General   Post-op Pain Management:    Induction: Intravenous  PONV Risk Score and Plan: 3 and Ondansetron and Dexamethasone  Airway Management Planned: Oral ETT  Additional Equipment: None  Intra-op Plan:   Post-operative Plan: Extubation in OR  Informed Consent: I have reviewed the patients History and Physical, chart, labs and discussed the procedure including the risks, benefits and alternatives for the proposed anesthesia with the patient or authorized representative who has indicated his/her understanding and acceptance.   Dental advisory given  Plan Discussed with: CRNA and Surgeon  Anesthesia Plan Comments:         Anesthesia Quick Evaluation

## 2017-10-18 NOTE — Op Note (Addendum)
ERCP was performed for positive intraoperative cholangiogram showing choledocholithiasis.  Findings:  Ampulla was located next to a duodenal diverticulum. It was cannulated uneventfully. The wire was never advanced into the pancreatic duct and pancreatic duct was never injected. After deep cannulation, cholangiogram revealed that the CBD was 10 mm and multiple filling defects were noted in the distal common bile duct compatible with choledocholithiasis. A sphincterotomy was performed, no bleeding was noted.  A 12 mm balloon was used to sweep the common bile duct several times which resulted in extraction of at least 7 CBD stones. After several balloon passes, repeat cholangiogram did not reveal any remaining CBD stones. The scope was withdrawn and the procedure was terminated.   Recommendations: Clear liquid diet,to be advanced as tolerated. Ok to D/C from GI standpoint.  Ronnette Juniper, M.D.

## 2017-10-18 NOTE — Interval H&P Note (Signed)
History and Physical Interval Note: 89/female with a positive IOC for choledocholithiasis is scheduled for an ERCP.  10/18/2017 10:51 AM  Donna Burch  has presented today for ERCP, with the diagnosis of CBD stones  The various methods of treatment have been discussed with the patient and family. After consideration of risks, benefits and other options for treatment, the patient has consented to  Procedure(s): ENDOSCOPIC RETROGRADE CHOLANGIOPANCREATOGRAPHY (ERCP) (N/A) as a surgical intervention .  The patient's history has been reviewed, patient examined, no change in status, stable for surgery.  I have reviewed the patient's chart and labs.  Questions were answered to the patient's satisfaction.     Ronnette Juniper

## 2017-10-18 NOTE — Progress Notes (Addendum)
PROGRESS NOTE    Donna Burch  LPF:[id number] DOB: 12/20/1926 DOA: 10/15/2017 PCP: Harlan Stains, MD   Brief Narrative:   Donna Burch is Donna Burch 84 y.o. female with medical history significant of CLL, arthritis, orthostatic hypotension, and kidney stones comes in with Donna Burch few hour history of abdominal pain.  She was in normal health yesterday.  Today while sitting on the couch, she developed sharp epigastric abdominal pain.  +belching, + nausea but no vomiting. Had Donna Burch normal bowel movement today.  She has known gallstones.  Has seen Donna Burch, most recently in 2018 but not sure what was recommended.  Denies new medications, denies alcohol.  No jaudice.  Does not think she drinks enough water  Found to have pancreatitis with gallstones on [place].  Presumed gallstone pancreatitis.  Plan for surgery today.  Assessment & Plan:   Active Problems:   CLL (chronic lymphocytic leukemia) (HCC)   Hypertension   GERD (gastroesophageal reflux disease)   Hypothyroidism   Pancreatitis   Gallstone pancreatitis   Gallstone Pancreatitis  Choledocolithiasis:  No etoh or obvious medication causes (prilosec <1%).   With gallstones on [place], this seems to be most likely cause.  Surgery c/s S/p cholecystectomy on 4/26 with IOC which was positive for nonobstruting choledocholithiasis Burch has been c/s by surgery and patient is now s/p ERCP with at least 7 CBD stones Pain control Continue zosyn for now  Elevated Liver Enzymes: elevated AST/ALT, bili, and alk phos post ob.  Likely 2/2 surgery, possibly related to stones.   Follow repeat labs tomorrow Now s/p ERCP with sphincterotomy and balloon extraction 4/27  Acute Kidney Injury:  Possibly related to NPO, surgery.  Will check urinalysis.  Continue IVF.  Hold losartan. Stop toradol.   Leukocytosis: likely post op.  No fevers.  Follow for si/sx infection.  Continue zosyn for now.   HTN: hold losartan with AKI  CLL: Follows with Dr.  Irene Limbo  Hypothyroidism: Follows with Dr. Buddy Duty Not on meds.  Normal TSH.  Hypomagnesemia: low mag, replete.  Will start PO.  Ectatic Abdominal Aorta: outpatient follow up  DVT prophylaxis: lovenox Code Status: full  Family Communication: daughter, son at bedside Disposition Plan: pending surgery   Consultants:   Surgery  Procedures:   Laparoscopic Cholecystectomy with [facility] 10/17/17  ERCP 4/27 - Choledocholithiasis was found. Complete removal was accomplished by biliary sphincterotomy and balloon extraction. - Donna Burch biliary sphincterotomy was performed. - The biliary tree was swept. Recommendation: - Clear liquid diet.   Antimicrobials: Anti-infectives (From admission, onward)   Start     Dose/Rate Route Frequency Ordered Stop   10/17/17 1500  piperacillin-tazobactam (ZOSYN) IVPB 3.375 g     3.375 g 12.5 mL/hr over 240 Minutes Intravenous Every 8 hours 10/17/17 1409     10/17/17 0600  ceFAZolin (ANCEF) IVPB 2g/100 mL premix  Status:  Discontinued     2 g 200 mL/hr over 30 Minutes Intravenous To [facility] 10/16/17 1541 10/17/17 1409      Subjective: No pain. Rolling down to surgery.  Objective: Vitals:   10/18/17 1310 10/18/17 1320 10/18/17 1330 10/18/17 1408  BP: (!) 139/47 (!) 145/48 (!) 132/40 129/62  Pulse: 89 86 88 82  Resp: 19 18 (!) 25 17  Temp: (!) 97.5 F (36.4 C)   97.6 F (36.4 C)  TempSrc: Oral   Oral  SpO2: 96% 96% 97% 99%  Weight:      Height:        Intake/Output Summary (Last  24 hours) at 10/18/2017 1456 Last data filed at 10/18/2017 1302 Gross per 24 hour  Intake 2328.08 ml  Output -  Net 2328.08 ml   Filed Weights   10/15/17 2013 10/16/17 0233  Weight: 59 kg (130 lb) 64.7 kg (142 lb 10.2 oz)    Examination:  General: No acute distress. Cardiovascular: Heart sounds show Tavon Corriher regular rate, and rhythm. No gallops or rubs. No murmurs. No JVD. Lungs: Clear to auscultation bilaterally with good air movement. No rales, rhonchi or  wheezes. Abdomen: Soft, mildly tender to palpation, nondistended with normal active bowel sounds. No masses. No hepatosplenomegaly. Neurological: Alert and oriented 3. Moves all extremities 4. Cranial nerves II through XII grossly intact. Skin: Warm and dry. No rashes or lesions. Extremities: No clubbing or cyanosis. No edema. Pedal pulses 2+. Psychiatric: Mood and affect are normal. Insight and judgment are appropriate.    Data Reviewed: I have personally reviewed following labs and imaging studies  CBC: Recent Labs  Lab 10/15/17 1342 10/16/17 0557 10/17/17 0616 10/18/17 0633  WBC 12.1* 8.8 9.9 22.5*  HGB 11.1* 10.5* 11.0* 10.3*  HCT 33.6* 32.4* 31.5* 30.3*  MCV 94.1 94.2 93.2 93.8  PLT 254 230 255 510   Basic Metabolic Panel: Recent Labs  Lab 10/15/17 1342 10/15/17 1944 10/16/17 0557 10/16/17 0955 10/17/17 0616 10/18/17 0633  NA 134*  --  139  --  138 134*  K 4.3  --  4.0  --  4.0 3.6  CL 103  --  109  --  107 101  CO2 22  --  22  --  23 23  GLUCOSE 113*  --  80  --  87 110*  BUN 19  --  14  --  8 18  CREATININE 1.02*  --  0.92  --  0.96 1.47*  CALCIUM 9.4  --  8.9  --  8.8* 8.4*  MG  --  1.4*  --  1.3* 2.3  --    GFR: Estimated Creatinine Clearance: 23.9 mL/min (Donna Burch) (by C-G formula based on SCr of 1.47 mg/dL (H)). Liver Function Tests: Recent Labs  Lab 10/15/17 1342 10/16/17 0557 10/17/17 0616 10/18/17 0633  AST 85* 64* 47* 718*  ALT 37 52 34 512*  ALKPHOS 75 75 71 154*  BILITOT 0.5 0.7 1.2 2.0*  PROT 7.6 6.8 6.8 6.5  ALBUMIN 3.3* 2.9* 2.9* 2.5*   Recent Labs  Lab 10/15/17 1342 10/16/17 0557 10/17/17 0616  LIPASE 1,442* 140* 50   No results for input(s): AMMONIA in the last 168 hours. Coagulation Profile: No results for input(s): INR, PROTIME in the last 168 hours. Cardiac Enzymes: No results for input(s): CKTOTAL, CKMB, CKMBINDEX, TROPONINI in the last 168 hours. BNP (last 3 results) No results for input(s): PROBNP in the last 8760  hours. HbA1C: No results for input(s): HGBA1C in the last 72 hours. CBG: No results for input(s): GLUCAP in the last 168 hours. Lipid Profile: Recent Labs    10/16/17 0557  CHOL 181  HDL 47  LDLCALC 123*  TRIG 53  CHOLHDL 3.9   Thyroid Function Tests: Recent Labs    10/15/17 1944  TSH 1.925   Anemia Panel: No results for input(s): VITAMINB12, FOLATE, FERRITIN, TIBC, IRON, RETICCTPCT in the last 72 hours. Sepsis Labs: No results for input(s): PROCALCITON, LATICACIDVEN in the last 168 hours.  Recent Results (from the past 240 hour(s))  Surgical pcr screen     Status: None   Collection Time: 10/16/17  7:45 PM  Result Value Ref Range Status   MRSA, PCR NEGATIVE NEGATIVE Final   Staphylococcus aureus NEGATIVE NEGATIVE Final    Comment: (NOTE) The Xpert SA Assay (FDA approved for NASAL specimens in patients 48 years of age and older), is one component of Maksim Peregoy comprehensive surveillance program. It is not intended to diagnose infection nor to guide or monitor treatment. Performed at [facility], [place] [address], [place], [place] [address]          Radiology Studies: Dg Cholangiogram Operative  Result Date: 10/17/2017 CLINICAL DATA:  84 year old female undergoing laparoscopic cholecystectomy EXAM: INTRAOPERATIVE CHOLANGIOGRAM TECHNIQUE: Cholangiographic images from the C-arm fluoroscopic device were submitted for interpretation post-operatively. Please see the procedural report for the amount of contrast and the fluoroscopy time utilized. COMPARISON:  Right upper quadrant ultrasound 10/15/2017 FINDINGS: Gillian Meeuwsen single intraoperative image is submitted. The images depict cannulation of the cystic duct remanent and opacification of the biliary tree. There is mild dilatation of the distal common bile duct. Multiple faceted filling defects are present within the distal common bile duct consistent with choledocholithiasis. Contrast passes through the ampulla and into the duodenum.  IMPRESSION: 1. Positive for nonobstructing choledocholithiasis. 2. Contrast material does pass the stones and enter the duodenum. Electronically Signed   By: Jacqulynn Cadet M.D.   On: 10/17/2017 12:27        Scheduled Meds: . enoxaparin (LOVENOX) injection  30 mg Subcutaneous Q24H  . losartan  100 mg Oral Daily  . magnesium chloride  1 tablet Oral BID   Continuous Infusions: . [START ON 10/19/2017] famotidine (PEPCID) IV    . piperacillin-tazobactam (ZOSYN)  IV 3.375 g (10/18/17 1410)     LOS: 2 days    Time spent: over 30 min    Fayrene Helper, MD [facility] Pager [phone]  If 7PM-7AM, please contact night-coverage [email or web address] Password [facility] 10/18/2017, 2:56 PM

## 2017-10-18 NOTE — Progress Notes (Signed)
[facility] Progress Note  1 Day Post-Op  Subjective: CC: sore in ribs and abdomen Patient feels sore. Tolerating some clears after surgery, but has been NPO since MN. Feels hungry. Passing flatus. UOP good. VSS.   Objective: Vital signs in last 24 hours: Temp:  [97.7 F (36.5 C)-98.9 F (37.2 C)] 97.7 F (36.5 C) (04/27 0517) Pulse Rate:  [60-102] 60 (04/27 0517) Resp:  [15-22] 15 (04/27 0517) BP: (117-177)/(55-101) 117/55 (04/27 0517) SpO2:  [96 %-99 %] 99 % (04/27 0517) Last BM Date: 10/15/17  Intake/Output from previous day: 04/26 0701 - 04/27 0700 In: 2090.6 [I.V.:1940.6; IV Piggyback:150] Out: 30 [Blood:30] Intake/Output this shift: No intake/output data recorded.  PE: Gen:  Alert, NAD, pleasant Card:  Regular rate and rhythm, pedal pulses 2+ BL Pulm:  Normal effort, clear to auscultation bilaterally Abd: Soft, appropriately tender, non-distended, bowel sounds present in all 4 quadrants, no HSM, incisions C/D/I Skin: warm and dry, no rashes  Psych: A&Ox3   Lab Results:  Recent Labs    10/17/17 0616 10/18/17 0633  WBC 9.9 22.5*  HGB 11.0* 10.3*  HCT 31.5* 30.3*  PLT 255 184   BMET Recent Labs    10/17/17 0616 10/18/17 0633  NA 138 134*  K 4.0 3.6  CL 107 101  CO2 23 23  GLUCOSE 87 110*  BUN 8 18  CREATININE 0.96 1.47*  CALCIUM 8.8* 8.4*   PT/INR No results for input(s): LABPROT, INR in the last 72 hours. CMP     Component Value Date/Time   NA 134 (L) 10/18/2017 0633   NA 139 01/17/2017 1122   K 3.6 10/18/2017 0633   K 4.5 01/17/2017 1122   CL 101 10/18/2017 0633   CO2 23 10/18/2017 0633   CO2 22 01/17/2017 1122   GLUCOSE 110 (H) 10/18/2017 0633   GLUCOSE 95 01/17/2017 1122   BUN 18 10/18/2017 0633   BUN 22.8 01/17/2017 1122   CREATININE 1.47 (H) 10/18/2017 0633   CREATININE 1.0 01/17/2017 1122   CALCIUM 8.4 (L) 10/18/2017 0633   CALCIUM 10.0 01/17/2017 1122   PROT 6.5 10/18/2017 0633   PROT 7.5 01/17/2017 1122   ALBUMIN 2.5 (L) 10/18/2017 0633   ALBUMIN 3.4 (L) 01/17/2017 1122   AST 718 (H) 10/18/2017 0633   AST 27 01/17/2017 1122   ALT 512 (H) 10/18/2017 0633   ALT 21 01/17/2017 1122   ALKPHOS 154 (H) 10/18/2017 0633   ALKPHOS 90 01/17/2017 1122   BILITOT 2.0 (H) 10/18/2017 0633   BILITOT 0.53 01/17/2017 1122   GFRNONAA 31 (L) 10/18/2017 0633   GFRNONAA 53 (L) 12/30/2016 1426   GFRAA 36 (L) 10/18/2017 0633   GFRAA 61 12/30/2016 1426   Lipase     Component Value Date/Time   LIPASE 50 10/17/2017 0616       Studies/Results: Dg Cholangiogram Operative  Result Date: 10/17/2017 CLINICAL DATA:  84 year old female undergoing laparoscopic cholecystectomy EXAM: INTRAOPERATIVE CHOLANGIOGRAM TECHNIQUE: Cholangiographic images from the C-arm fluoroscopic device were submitted for interpretation post-operatively. Please see the procedural report for the amount of contrast and the fluoroscopy time utilized. COMPARISON:  Right upper quadrant ultrasound 10/15/2017 FINDINGS: A single intraoperative image is submitted. The images depict cannulation of the cystic duct remanent and opacification of the biliary tree. There is mild dilatation of the distal common bile duct. Multiple faceted filling defects are present within the distal common bile duct consistent with choledocholithiasis. Contrast passes through the ampulla and into the duodenum. IMPRESSION: 1. Positive for nonobstructing  choledocholithiasis. 2. Contrast material does pass the stones and enter the duodenum. Electronically Signed   By: Jacqulynn Cadet M.D.   On: 10/17/2017 12:27    Anti-infectives: Anti-infectives (From admission, onward)   Start     Dose/Rate Route Frequency Ordered Stop   10/17/17 1500  piperacillin-tazobactam (ZOSYN) IVPB 3.375 g     3.375 g 12.5 mL/hr over 240 Minutes Intravenous Every 8 hours 10/17/17 1409     10/17/17 0600  ceFAZolin (ANCEF) IVPB 2g/100 mL premix  Status:  Discontinued     2 g 200 mL/hr over 30  Minutes Intravenous To ShortStay Surgical 10/16/17 1541 10/17/17 1409       Assessment/Plan CLL (chronic lymphocytic leukemia) ([place])   Hypertension   GERD (gastroesophageal reflux disease)   Hypothyroidism   Pancreatitis  Gallstone pancreatitis - s/p laparoscopic cholecystectomy 10/17/17 - POD#1 - Tbili 2.0, AST 718, ALT 512, Alk Phos 154 - recheck in AM - ERCP today for positive cholangiogram - CLD and ADAT after ERCP - mobilize  FEN: NPO, IVF - can start CLD and ADAT after ERCP VTE: SCDs ID: ancef periop, zosyn 4/27>>  Plan: ERCP today, likely home in the AM if doing well.    LOS: 2 days    Brigid Re , [facility] Surgery 10/18/2017, 10:08 AM Pager: [phone] Consults: [phone] Mon-Fri 7:00 am-4:30 pm Sat-Sun 7:00 am-11:30 am

## 2017-10-19 ENCOUNTER — Encounter (HOSPITAL_COMMUNITY): Payer: Self-pay | Admitting: Gastroenterology

## 2017-10-19 ENCOUNTER — Inpatient Hospital Stay (HOSPITAL_COMMUNITY): Payer: Medicare Other

## 2017-10-19 LAB — CBC
HCT: 31.6 % — ABNORMAL LOW (ref 36.0–46.0)
Hemoglobin: 10.5 g/dL — ABNORMAL LOW (ref 12.0–15.0)
MCH: 31 pg (ref 26.0–34.0)
MCHC: 33.2 g/dL (ref 30.0–36.0)
MCV: 93.2 fL (ref 78.0–100.0)
PLATELETS: 173 10*3/uL (ref 150–400)
RBC: 3.39 MIL/uL — AB (ref 3.87–5.11)
RDW: 13.5 % (ref 11.5–15.5)
WBC: 13.5 10*3/uL — ABNORMAL HIGH (ref 4.0–10.5)

## 2017-10-19 LAB — COMPREHENSIVE METABOLIC PANEL
ALBUMIN: 2.4 g/dL — AB (ref 3.5–5.0)
ALT: 266 U/L — AB (ref 14–54)
AST: 331 U/L — AB (ref 15–41)
Alkaline Phosphatase: 143 U/L — ABNORMAL HIGH (ref 38–126)
Anion gap: 6 (ref 5–15)
BUN: 16 mg/dL (ref 6–20)
CHLORIDE: 105 mmol/L (ref 101–111)
CO2: 24 mmol/L (ref 22–32)
CREATININE: 1.37 mg/dL — AB (ref 0.44–1.00)
Calcium: 8.3 mg/dL — ABNORMAL LOW (ref 8.9–10.3)
GFR calc Af Amer: 39 mL/min — ABNORMAL LOW (ref >60)
GFR calc non Af Amer: 33 mL/min — ABNORMAL LOW (ref >60)
GLUCOSE: 82 mg/dL (ref 65–99)
Potassium: 3.3 mmol/L — ABNORMAL LOW (ref 3.5–5.1)
Sodium: 135 mmol/L (ref 135–145)
Total Bilirubin: 1.6 mg/dL — ABNORMAL HIGH (ref 0.3–1.2)
Total Protein: 6.5 g/dL (ref 6.5–8.1)

## 2017-10-19 LAB — BASIC METABOLIC PANEL
ANION GAP: 12 (ref 5–15)
BUN: 15 mg/dL (ref 6–20)
CALCIUM: 8.3 mg/dL — AB (ref 8.9–10.3)
CO2: 22 mmol/L (ref 22–32)
Chloride: 100 mmol/L — ABNORMAL LOW (ref 101–111)
Creatinine, Ser: 1.33 mg/dL — ABNORMAL HIGH (ref 0.44–1.00)
GFR calc Af Amer: 40 mL/min — ABNORMAL LOW (ref >60)
GFR calc non Af Amer: 35 mL/min — ABNORMAL LOW (ref >60)
GLUCOSE: 122 mg/dL — AB (ref 65–99)
Potassium: 3.8 mmol/L (ref 3.5–5.1)
Sodium: 134 mmol/L — ABNORMAL LOW (ref 135–145)

## 2017-10-19 LAB — MAGNESIUM: Magnesium: 1.5 mg/dL — ABNORMAL LOW (ref 1.7–2.4)

## 2017-10-19 MED ORDER — OXYCODONE HCL 5 MG PO TABS
2.5000 mg | ORAL_TABLET | Freq: Once | ORAL | Status: AC
  Administered 2017-10-19: 2.5 mg via ORAL
  Filled 2017-10-19: qty 1

## 2017-10-19 MED ORDER — ACETAMINOPHEN 325 MG PO TABS: 650.0000 mg | ORAL_TABLET | Freq: Four times a day (QID) | ORAL | 0 refills | Status: AC | PRN

## 2017-10-19 MED ORDER — MAGNESIUM SULFATE 2 GM/50ML IV SOLN
2.0000 g | Freq: Once | INTRAVENOUS | Status: AC
  Administered 2017-10-19: 2 g via INTRAVENOUS
  Filled 2017-10-19: qty 50

## 2017-10-19 MED ORDER — LIDOCAINE 5 % EX PTCH: 1.0000 | MEDICATED_PATCH | CUTANEOUS | 0 refills | Status: DC

## 2017-10-19 MED ORDER — DICLOFENAC SODIUM 1 % TD GEL: 2.0000 g | Freq: Four times a day (QID) | TRANSDERMAL | 0 refills | Status: AC | PRN

## 2017-10-19 MED ORDER — LIDOCAINE 5 % EX PTCH
1.0000 | MEDICATED_PATCH | CUTANEOUS | Status: DC
  Filled 2017-10-19: qty 1

## 2017-10-19 MED ORDER — FAMOTIDINE 20 MG PO TABS: 20.0000 mg | ORAL_TABLET | Freq: Every day | ORAL | Status: DC

## 2017-10-19 MED ORDER — POTASSIUM CHLORIDE CRYS ER 20 MEQ PO TBCR
40.0000 meq | EXTENDED_RELEASE_TABLET | Freq: Once | ORAL | Status: AC
  Administered 2017-10-19: 40 meq via ORAL
  Filled 2017-10-19: qty 2

## 2017-10-19 MED ORDER — OXYCODONE HCL 5 MG PO TABS: 2.5000 mg | ORAL_TABLET | Freq: Four times a day (QID) | ORAL | 0 refills | Status: DC | PRN

## 2017-10-19 MED ORDER — DICLOFENAC SODIUM 1 % TD GEL
2.0000 g | Freq: Four times a day (QID) | TRANSDERMAL | Status: DC | PRN
  Filled 2017-10-19: qty 100

## 2017-10-19 NOTE — Progress Notes (Signed)
[facility] Progress Note  1 Day Post-Op  Subjective: CC: R rib pain Patient still complaining of R rib pain, Has not taken any pain medication. Not walking much. Tolerating diet.  UOP good. VSS.   Objective: Vital signs in last 24 hours: Temp:  [97.5 F (36.4 C)-98.6 F (37 C)] 97.7 F (36.5 C) (04/28 1149) Pulse Rate:  [80-91] 80 (04/28 1149) Resp:  [16-25] 16 (04/28 1149) BP: (129-150)/(40-72) 150/61 (04/28 1149) SpO2:  [96 %-100 %] 100 % (04/28 1149) Last BM Date: 10/15/17  Intake/Output from previous day: 04/27 0701 - 04/28 0700 In: 2063.3 [I.V.:1863.3; IV Piggyback:200] Out: -  Intake/Output this shift: Total I/O In: 120 [P.O.:120] Out: -   PE: Gen:  Alert, NAD, pleasant Card:  Regular rate and rhythm, pedal pulses 2+ BL Pulm:  Normal effort, clear to auscultation bilaterally Abd: Soft, appropriately tender, non-distended, bowel sounds present in all 4 quadrants, no HSM, incisions C/D/I Skin: warm and dry, no rashes  Psych: A&Ox3     Lab Results:  Recent Labs    10/18/17 0633 10/19/17 0525  WBC 22.5* 13.5*  HGB 10.3* 10.5*  HCT 30.3* 31.6*  PLT 184 173   BMET Recent Labs    10/19/17 0525 10/19/17 1106  NA 135 134*  K 3.3* 3.8  CL 105 100*  CO2 24 22  GLUCOSE 82 122*  BUN 16 15  CREATININE 1.37* 1.33*  CALCIUM 8.3* 8.3*   PT/INR No results for input(s): LABPROT, INR in the last 72 hours. CMP     Component Value Date/Time   NA 134 (L) 10/19/2017 1106   NA 139 01/17/2017 1122   K 3.8 10/19/2017 1106   K 4.5 01/17/2017 1122   CL 100 (L) 10/19/2017 1106   CO2 22 10/19/2017 1106   CO2 22 01/17/2017 1122   GLUCOSE 122 (H) 10/19/2017 1106   GLUCOSE 95 01/17/2017 1122   BUN 15 10/19/2017 1106   BUN 22.8 01/17/2017 1122   CREATININE 1.33 (H) 10/19/2017 1106   CREATININE 1.0 01/17/2017 1122   CALCIUM 8.3 (L) 10/19/2017 1106   CALCIUM 10.0 01/17/2017 1122   PROT 6.5 10/19/2017 0525   PROT 7.5 01/17/2017 1122   ALBUMIN 2.4  (L) 10/19/2017 0525   ALBUMIN 3.4 (L) 01/17/2017 1122   AST 331 (H) 10/19/2017 0525   AST 27 01/17/2017 1122   ALT 266 (H) 10/19/2017 0525   ALT 21 01/17/2017 1122   ALKPHOS 143 (H) 10/19/2017 0525   ALKPHOS 90 01/17/2017 1122   BILITOT 1.6 (H) 10/19/2017 0525   BILITOT 0.53 01/17/2017 1122   GFRNONAA 35 (L) 10/19/2017 1106   GFRNONAA 53 (L) 12/30/2016 1426   GFRAA 40 (L) 10/19/2017 1106   GFRAA 61 12/30/2016 1426   Lipase     Component Value Date/Time   LIPASE 50 10/17/2017 0616       Studies/Results: Dg Ribs Unilateral W/chest Right  Result Date: 10/19/2017 CLINICAL DATA:  Right anterior rib pain. EXAM: RIGHT RIBS AND CHEST - 3+ VIEW COMPARISON:  Chest radiograph on 10/15/2017 FINDINGS: No fracture or other bone lesions are seen involving the ribs. There is no evidence of pneumothorax or pleural effusion. New mild linear opacity in both lung bases is consistent with subsegmental atelectasis. No evidence of pulmonary consolidation or edema. Small to moderate hiatal hernia is seen. IMPRESSION: No right rib fracture identified. Mild bibasilar atelectasis. Small to moderate hiatal hernia. Electronically Signed   By: Earle Gell M.D.   On: 10/19/2017 13:00  Dg Ercp Biliary & Pancreatic Ducts  Result Date: 10/18/2017 CLINICAL DATA:  84 year old female with choledocholithiasis EXAM: ERCP TECHNIQUE: Multiple spot images obtained with the fluoroscopic device and submitted for interpretation post-procedure. FLUOROSCOPY TIME:  Fluoroscopy Time:  2 minutes 5 seconds COMPARISON:  Intraoperative cholangiogram 10/17/2017 FINDINGS: A total of 7 intraoperative saved images are obtained during ERCP. The images demonstrate a flexible endoscope in the descending duodenum with wire cannulation of the right hepatic ducts followed by cholangiogram again demonstrating choledocholithiasis. Subsequent images show sphincterotomy and balloon sweeping of the common duct. IMPRESSION: 1. Choledocholithiasis. 2.  ERCP with sphincterotomy and balloon sweeping of the common duct. These images were submitted for radiologic interpretation only. Please see the procedural report for the amount of contrast and the fluoroscopy time utilized. Electronically Signed   By: Jacqulynn Cadet M.D.   On: 10/18/2017 15:08    Anti-infectives: Anti-infectives (From admission, onward)   Start     Dose/Rate Route Frequency Ordered Stop   10/17/17 1500  piperacillin-tazobactam (ZOSYN) IVPB 3.375 g  Status:  Discontinued     3.375 g 12.5 mL/hr over 240 Minutes Intravenous Every 8 hours 10/17/17 1409 10/19/17 1140   10/17/17 0600  ceFAZolin (ANCEF) IVPB 2g/100 mL premix  Status:  Discontinued     2 g 200 mL/hr over 30 Minutes Intravenous To ShortStay Surgical 10/16/17 1541 10/17/17 1409       Assessment/Plan CLL (chronic lymphocytic leukemia) ([place]) Hypertension GERD (gastroesophageal reflux disease) Hypothyroidism Pancreatitis  Gallstone pancreatitis - s/p laparoscopic cholecystectomy 10/17/17 - POD#2 - s/p ERCP yesterday  - Tbili and LFTs trending down  - mobilize  FEN: HH diet VTE: SCDs ID: ancef periop, zosyn 4/27>>  Plan: d/c abx. Fine to discharge home from a surgery standpoint. F/u in chart.     LOS: 3 days    Brigid Re , [facility] Surgery 10/19/2017, 1:05 PM Pager: [phone] Consults: [phone] Mon-Fri 7:00 am-4:30 pm Sat-Sun 7:00 am-11:30 am

## 2017-10-19 NOTE — Evaluation (Signed)
Physical Therapy Evaluation Patient Details Name: Donna Burch MRN: [id number] DOB: 27-Feb-1927 Today's Date: 10/19/2017   History of Present Illness  Pt. is a 84 y.o. F with significant PMH of CLL, arthritis, orthostatic hypotension and kidney stones who presents with abdominal pain. Found to have gallstone pancreatitis. S/p sphincherotomy, balloon sweep and stone removal (7 stones).  Clinical Impression  Patient presenting with decreased functional mobility secondary to diminished balance, gait deviations, cognitive deficits, and right ribcage pain. Patient lives alone at home but family able to provide assist. Patient is confused and disoriented to place, stating she was at the doctor's office. Emotionally labile upon entrance due to right lower ribcage pain; RN notified and aware. Rib pain increases with movement i.e. Sit to stand. Patient instructed on pillow splinting for further comfort. Unclear how close patient mobility is to baseline secondary to cognitive deficits. Patient ambulating 100 feet with min guard assist and no assistive device. Has short, shuffling steps and decreased gait speed. Able to negotiate 2 steps with use of railing and min guard assist. Will follow acutely to increase mobility. See below for follow up recommendations.     Follow Up Recommendations Home health PT;Supervision for mobility/OOB    Equipment Recommendations  None recommended by PT    Recommendations for Other Services       Precautions / Restrictions Precautions Precautions: Fall Restrictions Weight Bearing Restrictions: No      Mobility  Bed Mobility               General bed mobility comments: Sitting EOB upon arrival  Transfers Overall transfer level: Needs assistance Equipment used: None Transfers: Sit to/from Stand Sit to Stand: Supervision            Ambulation/Gait Ambulation/Gait assistance: Min guard Ambulation Distance (Feet): 100 Feet Assistive device:  None Gait Pattern/deviations: Step-through pattern;Shuffle Gait velocity: decreased   General Gait Details: Patient with decreased bilateral foot clearance and diminished stride length. Holds onto R side with hand for bracing due to pain.  Stairs Stairs: Yes Stairs assistance: Min guard Stair Management: One rail Right Number of Stairs: 5 General stair comments: Min guard   Wheelchair Mobility    Modified Rankin (Stroke Patients Only)       Balance Overall balance assessment: Mild deficits observed, not formally tested                                           Pertinent Vitals/Pain Pain Assessment: 0-10 Pain Score: 8  Pain Location: Right rib cage Pain Descriptors / Indicators: Discomfort Pain Intervention(s): Limited activity within patient's tolerance;Monitored during session;Patient requesting pain meds-RN notified    Home Living Family/patient expects to be discharged to:: Private residence Living Arrangements: Alone Available Help at Discharge: Family Type of Home: House Home Access: Stairs to enter Entrance Stairs-Rails: Can reach both Entrance Stairs-Number of Steps: 3 Home Layout: One level Home Equipment: None      Prior Function Level of Independence: Independent               Hand Dominance        Extremity/Trunk Assessment   Upper Extremity Assessment Upper Extremity Assessment: Overall WFL for tasks assessed    Lower Extremity Assessment Lower Extremity Assessment: Overall WFL for tasks assessed    Cervical / Trunk Assessment Cervical / Trunk Assessment: Kyphotic  Communication   Communication: No difficulties  Cognition Arousal/Alertness: Awake/alert Behavior During Therapy: WFL for tasks assessed/performed Overall Cognitive Status: Impaired/Different from baseline Area of Impairment: Orientation;Memory                 Orientation Level: Disoriented to;Place;Situation   Memory: Decreased short-term  memory         General Comments: Patient is confused and disoriented to place, stating she was at her doctor's office.       General Comments General comments (skin integrity, edema, etc.): Patient daughter and niece present throughout.     Exercises     Assessment/Plan    PT Assessment Patient needs continued PT services  PT Problem List Pain;Decreased mobility;Decreased balance;Decreased activity tolerance;Decreased strength       PT Treatment Interventions DME instruction;Gait training;Stair training;Functional mobility training;Therapeutic activities;Therapeutic exercise;Balance training;Patient/family education    PT Goals (Current goals can be found in the Care Plan section)  Acute Rehab PT Goals Patient Stated Goal: "do what I want to do." PT Goal Formulation: With patient Time For Goal Achievement: 11/02/17 Potential to Achieve Goals: Good    Frequency Min 3X/week   Barriers to discharge        Co-evaluation               AM-PAC PT "6 Clicks" Daily Activity  Outcome Measure Difficulty turning over in bed (including adjusting bedclothes, sheets and blankets)?: None Difficulty moving from lying on back to sitting on the side of the bed? : A Little Difficulty sitting down on and standing up from a chair with arms (e.g., wheelchair, bedside commode, etc,.)?: A Little Help needed moving to and from a bed to chair (including a wheelchair)?: A Little Help needed walking in hospital room?: A Little Help needed climbing 3-5 steps with a railing? : A Little 6 Click Score: 19    End of Session Equipment Utilized During Treatment: Gait belt Activity Tolerance: Patient tolerated treatment well Patient left: in chair;with call bell/phone within reach;with family/visitor present Nurse Communication: Mobility status;Patient requests pain meds PT Visit Diagnosis: Unsteadiness on feet (R26.81);Pain Pain - Right/Left: Right Pain - part of body: (ribcage)    Time:  0998-3382 PT Time Calculation (min) (ACUTE ONLY): 24 min   Charges:   PT Evaluation $PT Eval Moderate Complexity: 1 Mod PT Treatments $Therapeutic Activity: 8-22 mins   PT G Codes:        Ellamae Sia, PT, DPT Acute Rehabilitation Services  Pager: [phone] 10/19/2017, 9:29 AM

## 2017-10-19 NOTE — Care Management Note (Signed)
Case Management Note  Patient Details  Name: Donna Burch MRN: [id number] Date of Birth: 11-21-1926  Subjective/Objective:       Gallstone pancreatitis.           S/p cholecystectomy on 4/26 with IOC          Alverda Skeans (Daughter) Yoshiye Kraft [facility])        [phone]               Khrystyne Arpin [facility])        [phone]       PCP: Rosezena Sensor  Action/Plan: Transition to home with home health services (PT). Family to provide transportation to home.  Expected Discharge Date:  10/19/17               Expected Discharge Plan:  [place]  In-House Referral:     Discharge planning Services  CM Consult  Post Acute Care Choice:    Choice offered to:  Patient  DME Arranged:    DME Agency:       HH Arranged:  PT [place]:  [place]  Status of Service:  Completed, signed off  If discussed at [place] of Stay Meetings, dates discussed:    Additional Comments:  Sharin Mons, RN 10/19/2017, 1:50 PM

## 2017-10-19 NOTE — Discharge Summary (Signed)
Physician Discharge Summary  Donna Burch MRN:[id number] DOB: 10/30/1926 DOA: 10/15/2017  PCP: Burch, Cynthia, MD  Admit date: 10/15/2017 Discharge date: 10/19/2017  Time spent: 40 minutes  Recommendations for Outpatient Follow-up:  1. Follow up outpatient CBC/CMP/Mag (attention to LFT's and creatinine.  Currently holding losartan) 2. Follow up R sided rib pain.  Plain films without evidence of rib fracture.  D/c'd with voltaren, lidocaine patch, oxycodone, APAP for pain.  Follow. 3. Follow up with surgery post op 4. Follow ectactic aortic, recommended f/u imaging in 5 years per rads 5. Follow blood pressures, when or if to resume losartan  Discharge Diagnoses:  Active Problems:   CLL (chronic lymphocytic leukemia) (HCC)   Hypertension   GERD (gastroesophageal reflux disease)   Hypothyroidism   Pancreatitis   Gallstone pancreatitis   Common bile duct calculi   Discharge Condition: stable  Diet recommendation: heart healthy  Filed Weights   10/15/17 2013 10/16/17 0233  Weight: 59 kg (130 lb) 64.7 kg (142 lb 10.2 oz)    History of present illness:  Donna Burch Donna Burch 84 y.o.femalewith medical history significant ofCLL, arthritis, orthostatic hypotension, and kidney stones comes in with Donna Burch few hour history of abdominal pain. She was in normal health yesterday. Today while sitting on the couch, she developed sharp epigastric abdominal pain. +belching, + nausea but no vomiting. Had Donna Burch normal bowel movement today. She has known gallstones. Has seen [facility], most recently in 2018 but not sure what was recommended. Denies new medications, denies alcohol. No jaudice. Does not think she drinks enough water  She was found to have gallstone pancreatitis.  She had cholecystectomy on 4/26 with IOC positive for nonobstructive choledocholithiasis.  She went for ERCP with balloon extraction and sphincterotomy on 4/27.  She was doing well on the day of discharge but had TTP over her  ribs on the right side.  Plain films were negative.  Discharged with pain regimen.    Hospital Course:  Gallstone Pancreatitis  Choledocolithiasis:  No etoh or obvious medication causes (prilosec <1%).   US with gallstones S/p cholecystectomy on 4/26 with IOC which was positive for nonobstruting choledocholithiasis S/p ERCP by GI with at least 7 CBD stones Pain control D/c zosyn  Elevated Liver Enzymes: elevated AST/ALT, bili, and alk phos post ob.  Likely 2/2 surgery, possibly related to stones.   Improving on day of discharge.  Will need continued follow up as outpatient.  Now s/p ERCP with sphincterotomy and balloon extraction 4/27  Acute Kidney Injury:  Possibly related to NPO, surgery.  Will check urinalysis (negative).  Hold losartan. Stop toradol.  Follow up outpatient.  Improving on day of discharge.   Right sided rib pain: negative plain films.  Discharged with pain regimen.  Follow as outpatient.   Leukocytosis: likely post op.  No fevers.  Follow for si/sx infection.  Improving.  D/c abx.   HTN: hold losartan with AKI  CLL: Follows with Dr. Kale  Hypothyroidism: Follows with Dr. Kerr Not on meds.  Normal TSH.  Hypomagnesemia: low mag, replete.  Continue home mag.   Ectatic Abdominal Aorta: outpatient follow up  Procedures:  Laparoscopic Cholecystectomy with IOC 10/17/17  ERCP 4/27 - Choledocholithiasis was found. Complete removal was accomplished by biliary sphincterotomy and balloon extraction. - Donna Burch biliary sphincterotomy was performed. - The biliary tree was swept. Recommendation: - Clear liquid diet.  Consultations:  GI  Surgery  Discharge Exam: Vitals:   10/19/17 0522 10/19/17 1149  BP: (!) 145/72 (!)   150/61  Pulse: 83 80  Resp: 17 16  Temp: 97.6 F (36.4 C) 97.7 F (36.5 C)  SpO2: 97% 100%   Pain at R ribs.   Pain when moving, getting up.  General: No acute distress. Cardiovascular: Heart sounds show Donna Burch regular rate, and rhythm. No  gallops or rubs. No murmurs. No JVD. Lungs: Clear to auscultation bilaterally with good air movement. No rales, rhonchi or wheezes. Abdomen: Soft, nontender, nondistended with normal active bowel sounds. No masses. No hepatosplenomegaly.  TTP along ribs on R side.  Neurological: Alert and oriented 3. Moves all extremities 4. Cranial nerves II through XII grossly intact. Skin: Warm and dry. No rashes or lesions. Extremities: No clubbing or cyanosis. No edema. Pedal pulses 2+. Psychiatric: Mood and affect are normal. Insight and judgment are appropriate.  Discharge Instructions   Discharge Instructions    Call MD for:  difficulty breathing, headache or visual disturbances   Complete by:  As directed    Call MD for:  persistant dizziness or light-headedness   Complete by:  As directed    Call MD for:  persistant nausea and vomiting   Complete by:  As directed    Call MD for:  severe uncontrolled pain   Complete by:  As directed    Call MD for:  temperature >100.4   Complete by:  As directed    Diet - low sodium heart healthy   Complete by:  As directed    Diet - low sodium heart healthy   Complete by:  As directed    Discharge instructions   Complete by:  As directed    You were seen for gallstone pancreatitis.  You had Donna Burch cholecystectomy and an ERCP to remove the stones and help prevent recurrence.  You had right sided musculoskeletal pain.  The x ray was negative for rib fractures.  Please use tylenol for pain.  I'll also prescribe Donna Burch lidocaine patch and voltaren gel which are topical agents you can use (if you aren't able to get the lidocaine patch due to insurance, use an over the counter topical lidocaine).  You can use oxycodone as needed for pain not controlled by the other methods (you can take up to 5 mg if 2.5 mg is not helping).    Your kidney function was decreased at discharge.  Stop your losartan until you follow up with your primary care doctor to follow this up.  Don't  take NSAIDs (ibuprofen, etc).     Your aorta was abnormal on imaging.  Please follow up with your PCP to plan follow up imaging.   Return if you have new, recurrent, or worsening symptoms.  Please ask your PCP to request records from this hospitalization so they know what was done and what the next steps will be.   Increase activity slowly   Complete by:  As directed    Increase activity slowly   Complete by:  As directed      Allergies as of 10/19/2017      Reactions   Prednisone Other (See Comments)   "Jittery"   Reclast [zoledronic Acid] Other (See Comments)   Pt did not have Theodus Ran memory, from the day after she took that medication    Hydrochlorothiazide Other (See Comments)   Lisinopril Cough      Medication List    STOP taking these medications   losartan 100 MG tablet Commonly known as:  COZAAR     TAKE these medications   acetaminophen  325 MG tablet Commonly known as:  TYLENOL Take 2 tablets (650 mg total) by mouth every 6 (six) hours as needed for mild pain.   aspirin 81 MG chewable tablet Take 81 mg by mouth daily.   cholecalciferol 1000 units tablet Commonly known as:  VITAMIN D Take 2,000 Units by mouth daily.   COLON FORMULA PO Take 1 capsule by mouth daily.   diclofenac sodium 1 % Gel Commonly known as:  VOLTAREN Apply 2 g topically 4 (four) times daily as needed (pain).   ferrous sulfate 325 (65 FE) MG EC tablet Take 325 mg by mouth daily with breakfast.   levothyroxine 75 MCG tablet Commonly known as:  SYNTHROID, LEVOTHROID Take 75 mcg by mouth daily.   MULTIVITAMIN ADULTS PO Take by mouth daily.   omeprazole 40 MG capsule Commonly known as:  PRILOSEC Take 40 mg by mouth daily.   oxyCODONE 5 MG immediate release tablet Commonly known as:  Oxy IR/ROXICODONE Take 0.5 tablets (2.5 mg total) by mouth every 6 (six) hours as needed for up to 16 doses for severe pain.   SLOW-MAG 64 MG Tbec SR tablet Generic drug:  magnesium chloride Take 1  tablet by mouth 2 (two) times daily.   SUPER B COMPLEX PO Take by mouth daily.      Allergies  Allergen Reactions  . Prednisone Other (See Comments)    "Jittery"  . Reclast [Zoledronic Acid] Other (See Comments)    Pt did not have Susann Lawhorne memory, from the day after she took that medication   . Hydrochlorothiazide Other (See Comments)  . Lisinopril Cough   Follow-up Information    [facility] Follow up.   Specialty:  General Surgery Why:  Call to confirm appointment date/time. Bring photo ID and insurance information. Please arrive 30 min prior to appointment time.  Contact information: [place] [address] [phone]        Harlan Stains, MD Follow up.   Specialty:  Family Medicine Contact information: [place] [address] [phone]            The results of significant diagnostics from this hospitalization (including imaging, microbiology, ancillary and laboratory) are listed below for reference.    Significant Diagnostic Studies: Dg Chest 2 View  Result Date: 10/15/2017 CLINICAL DATA:  Chest pain EXAM: CHEST - 2 VIEW COMPARISON:  01/30/2017 FINDINGS: Mild elevation of left diaphragm. No focal airspace disease or pleural effusion. Stable cardiomediastinal silhouette with aortic atherosclerosis. No pneumothorax. Mild degenerative changes of the spine. IMPRESSION: No active cardiopulmonary disease. Mild elevation of the left diaphragm. Electronically Signed   By: Donavan Foil M.D.   On: 10/15/2017 14:36   Dg Ribs Unilateral W/chest Right  Result Date: 10/19/2017 CLINICAL DATA:  Right anterior rib pain. EXAM: RIGHT RIBS AND CHEST - 3+ VIEW COMPARISON:  Chest radiograph on 10/15/2017 FINDINGS: No fracture or other bone lesions are seen involving the ribs. There is no evidence of pneumothorax or pleural effusion. New mild linear opacity in both lung bases is consistent with subsegmental atelectasis.  No evidence of pulmonary consolidation or edema. Small to moderate hiatal hernia is seen. IMPRESSION: No right rib fracture identified. Mild bibasilar atelectasis. Small to moderate hiatal hernia. Electronically Signed   By: Earle Gell M.D.   On: 10/19/2017 13:00   Dg Cholangiogram Operative  Result Date: 10/17/2017 CLINICAL DATA:  84 year old female undergoing laparoscopic cholecystectomy EXAM: INTRAOPERATIVE CHOLANGIOGRAM TECHNIQUE: Cholangiographic images from  the C-arm fluoroscopic device were submitted for interpretation post-operatively. Please see the procedural report for the amount of contrast and the fluoroscopy time utilized. COMPARISON:  Right upper quadrant ultrasound 10/15/2017 FINDINGS: Kayline Sheer single intraoperative image is submitted. The images depict cannulation of the cystic duct remanent and opacification of the biliary tree. There is mild dilatation of the distal common bile duct. Multiple faceted filling defects are present within the distal common bile duct consistent with choledocholithiasis. Contrast passes through the ampulla and into the duodenum. IMPRESSION: 1. Positive for nonobstructing choledocholithiasis. 2. Contrast material does pass the stones and enter the duodenum. Electronically Signed   By: Jacqulynn Cadet M.D.   On: 10/17/2017 12:27   US Abdomen Complete  Result Date: 10/15/2017 CLINICAL DATA:  Abdominal pain.  Pancreatitis.  Elevated lipase. EXAM: ABDOMEN ULTRASOUND COMPLETE COMPARISON:  Abdominal ultrasound 06/25/2016. FINDINGS: Gallbladder: Gerasimos Plotts few small stones measuring up to 7 mm and sludge are seen in the gallbladder. No pericholecystic fluid or wall thickening. Sonographer reports negative Murphy's sign. Common bile duct: Diameter: 0.4 cm Liver: No focal lesion identified. Within normal limits in parenchymal echogenicity. Portal vein is patent on color Doppler imaging with normal direction of blood flow towards the liver. IVC: No abnormality visualized. Pancreas: Not  visualized due to overlying bowel gas. Spleen: Size and appearance within normal limits. Right Kidney: Length: 12.6 cm. Echogenicity within normal limits. 5.2 cm cyst seen. No hydronephrosis visualized. Left Kidney: Length: 9.9 cm. Echogenicity within normal limits. 1.7 cm cyst seen. No hydronephrosis visualized. Abdominal aorta: No aneurysm visualized. Other findings: No free or focal fluid collection. IMPRESSION: Pancreas is not visualized. Small gallstones and gallbladder sludge. Negative for acute cholecystitis. Ectatic abdominal aorta at risk for aneurysm development. Recommend followup by ultrasound in 5 years. This recommendation follows ACR consensus guidelines: Burch Paper of the ACR Incidental Findings Committee II on Vascular Findings. J Am Coll Radiol 2013; 10:789-794. Electronically Signed   By: Inge Rise M.D.   On: 10/15/2017 17:53   Dg Ercp Biliary & Pancreatic Ducts  Result Date: 10/18/2017 CLINICAL DATA:  84 year old female with choledocholithiasis EXAM: ERCP TECHNIQUE: Multiple spot images obtained with the fluoroscopic device and submitted for interpretation post-procedure. FLUOROSCOPY TIME:  Fluoroscopy Time:  2 minutes 5 seconds COMPARISON:  Intraoperative cholangiogram 10/17/2017 FINDINGS: Juelz Whittenberg total of 7 intraoperative saved images are obtained during ERCP. The images demonstrate Maclane Holloran flexible endoscope in the descending duodenum with wire cannulation of the right hepatic ducts followed by cholangiogram again demonstrating choledocholithiasis. Subsequent images show sphincterotomy and balloon sweeping of the common duct. IMPRESSION: 1. Choledocholithiasis. 2. ERCP with sphincterotomy and balloon sweeping of the common duct. These images were submitted for radiologic interpretation only. Please see the procedural report for the amount of contrast and the fluoroscopy time utilized. Electronically Signed   By: Jacqulynn Cadet M.D.   On: 10/18/2017 15:08    Microbiology: Recent Results  (from the past 240 hour(s))  Surgical pcr screen     Status: None   Collection Time: 10/16/17  7:45 PM  Result Value Ref Range Status   MRSA, PCR NEGATIVE NEGATIVE Final   Staphylococcus aureus NEGATIVE NEGATIVE Final    Comment: (NOTE) The Xpert SA Assay (FDA approved for NASAL specimens in patients 1 years of age and older), is one component of Estill Llerena comprehensive surveillance program. It is not intended to diagnose infection nor to guide or monitor treatment. Performed at [facility], [place] [address], [place], [place] [address]  Labs: Basic Metabolic Panel: Recent Labs  Lab 10/15/17 1944 10/16/17 0557 10/16/17 0955 10/17/17 0616 10/18/17 0633 10/19/17 0525 10/19/17 1106  NA  --  139  --  138 134* 135 134*  K  --  4.0  --  4.0 3.6 3.3* 3.8  CL  --  109  --  107 101 105 100*  CO2  --  22  --  _0 GLUCOSE  --  80  --  87 110* 82 122*  BUN  --  14  --  _1 CREATININE  --  0.92  --  0.96 1.47* 1.37* 1.33*  CALCIUM  --  8.9  --  8.8* 8.4* 8.3* 8.3*  MG 1.4*  --  1.3* 2.3  --  1.5*  --    Liver Function Tests: Recent Labs  Lab 10/15/17 1342 10/16/17 0557 10/17/17 0616 10/18/17 0633 10/19/17 0525  AST 85* 64* 47* 718* 331*  ALT 37 52 34 512* 266*  ALKPHOS 75 75 71 154* 143*  BILITOT 0.5 0.7 1.2 2.0* 1.6*  PROT 7.6 6.8 6.8 6.5 6.5  ALBUMIN 3.3* 2.9* 2.9* 2.5* 2.4*   Recent Labs  Lab 10/15/17 1342 10/16/17 0557 10/17/17 0616  LIPASE 1,442* 140* 50   No results for input(s): AMMONIA in the last 168 hours. CBC: Recent Labs  Lab 10/15/17 1342 10/16/17 0557 10/17/17 0616 10/18/17 0633 10/19/17 0525  WBC 12.1* 8.8 9.9 22.5* 13.5*  HGB 11.1* 10.5* 11.0* 10.3* 10.5*  HCT 33.6* 32.4* 31.5* 30.3* 31.6*  MCV 94.1 94.2 93.2 93.8 93.2  PLT 254 230 255 184 173   Cardiac Enzymes: No results for input(s): CKTOTAL, CKMB, CKMBINDEX, TROPONINI in the last 168 hours. BNP: BNP (last 3 results) No results for input(s): BNP in the last 8760  hours.  ProBNP (last 3 results) No results for input(s): PROBNP in the last 8760 hours.  CBG: No results for input(s): GLUCAP in the last 168 hours.     Signed:  Fayrene Helper MD.  [facility] 10/19/2017, 1:38 PM

## 2017-10-19 NOTE — Progress Notes (Signed)
Subjective: The patient was seen and examined at bedside.Is able to tolerate diet but complains of pain over right lower rib cage.  Objective: Vital signs in last 24 hours: Temp:  [97.5 F (36.4 C)-98.6 F (37 C)] 97.6 F (36.4 C) (04/28 0522) Pulse Rate:  [75-91] 83 (04/28 0522) Resp:  [16-25] 17 (04/28 0522) BP: (129-145)/(40-72) 145/72 (04/28 0522) SpO2:  [95 %-99 %] 97 % (04/28 0522) Weight change:  Last BM Date: 10/15/17  PE:appears her stated age, not in distress GENERAL:no icterus, mild pallor ABDOMEN:soft, normoactive bowel sounds, pain over the right anterior lower rib cage EXTREMITIES:no edema  Lab Results: Results for orders placed or performed during the hospital encounter of 10/15/17 (from the past 48 hour(s))  Comprehensive metabolic panel     Status: Abnormal   Collection Time: 10/18/17  6:33 AM  Result Value Ref Range   Sodium 134 (L) 135 - 145 mmol/L   Potassium 3.6 3.5 - 5.1 mmol/L   Chloride 101 101 - 111 mmol/L   CO2 23 22 - 32 mmol/L   Glucose, Bld 110 (H) 65 - 99 mg/dL   BUN 18 6 - 20 mg/dL   Creatinine, Ser 1.47 (H) 0.44 - 1.00 mg/dL   Calcium 8.4 (L) 8.9 - 10.3 mg/dL   Total Protein 6.5 6.5 - 8.1 g/dL   Albumin 2.5 (L) 3.5 - 5.0 g/dL   AST 718 (H) 15 - 41 U/L   ALT 512 (H) 14 - 54 U/L   Alkaline Phosphatase 154 (H) 38 - 126 U/L   Total Bilirubin 2.0 (H) 0.3 - 1.2 mg/dL   GFR calc non Af Amer 31 (L) >60 mL/min   GFR calc Af Amer 36 (L) >60 mL/min    Comment: (NOTE) The eGFR has been calculated using the CKD EPI equation. This calculation has not been validated in all clinical situations. eGFR's persistently <60 mL/min signify possible Chronic Kidney Disease.    Anion gap 10 5 - 15    Comment: Performed at [facility], [address] [place], [place] [address]  CBC     Status: Abnormal   Collection Time: 10/18/17  6:33 AM  Result Value Ref Range   WBC 22.5 (H) 4.0 - 10.5 K/uL   RBC 3.23 (L) 3.87 - 5.11 MIL/uL   Hemoglobin 10.3 (L)  12.0 - 15.0 g/dL   HCT 30.3 (L) 36.0 - 46.0 %   MCV 93.8 78.0 - 100.0 fL   MCH 31.9 26.0 - 34.0 pg   MCHC 34.0 30.0 - 36.0 g/dL   RDW 13.5 11.5 - 15.5 %   Platelets 184 150 - 400 K/uL    Comment: Performed at [facility], [address] [place], [place] [address]  Urinalysis, Routine w reflex microscopic     Status: Abnormal   Collection Time: 10/18/17  2:57 PM  Result Value Ref Range   Color, Urine YELLOW YELLOW   APPearance CLEAR CLEAR   Specific Gravity, Urine 1.014 1.005 - 1.030   pH 6.0 5.0 - 8.0   Glucose, UA NEGATIVE NEGATIVE mg/dL   Hgb urine dipstick NEGATIVE NEGATIVE   Bilirubin Urine NEGATIVE NEGATIVE   Ketones, ur NEGATIVE NEGATIVE mg/dL   Protein, ur NEGATIVE NEGATIVE mg/dL   Nitrite NEGATIVE NEGATIVE   Leukocytes, UA SMALL (A) NEGATIVE   WBC, UA 6-10 0 - 5 WBC/hpf   Bacteria, UA NONE SEEN NONE SEEN    Comment: Performed at [facility], [address] [place],   [place] [address]  Comprehensive metabolic panel     Status: Abnormal   Collection Time: 10/19/17  5:25 AM  Result Value Ref Range   Sodium 135 135 - 145 mmol/L   Potassium 3.3 (L) 3.5 - 5.1 mmol/L   Chloride 105 101 - 111 mmol/L   CO2 24 22 - 32 mmol/L   Glucose, Bld 82 65 - 99 mg/dL   BUN 16 6 - 20 mg/dL   Creatinine, Ser 1.37 (H) 0.44 - 1.00 mg/dL   Calcium 8.3 (L) 8.9 - 10.3 mg/dL   Total Protein 6.5 6.5 - 8.1 g/dL   Albumin 2.4 (L) 3.5 - 5.0 g/dL   AST 331 (H) 15 - 41 U/L   ALT 266 (H) 14 - 54 U/L   Alkaline Phosphatase 143 (H) 38 - 126 U/L   Total Bilirubin 1.6 (H) 0.3 - 1.2 mg/dL   GFR calc non Af Amer 33 (L) >60 mL/min   GFR calc Af Amer 39 (L) >60 mL/min    Comment: (NOTE) The eGFR has been calculated using the CKD EPI equation. This calculation has not been validated in all clinical situations. eGFR's persistently <60 mL/min signify possible Chronic Kidney Disease.    Anion gap 6 5 - 15    Comment: Performed at [facility], [address] [place], [place]  [address]  CBC     Status: Abnormal   Collection Time: 10/19/17  5:25 AM  Result Value Ref Range   WBC 13.5 (H) 4.0 - 10.5 K/uL   RBC 3.39 (L) 3.87 - 5.11 MIL/uL   Hemoglobin 10.5 (L) 12.0 - 15.0 g/dL   HCT 31.6 (L) 36.0 - 46.0 %   MCV 93.2 78.0 - 100.0 fL   MCH 31.0 26.0 - 34.0 pg   MCHC 33.2 30.0 - 36.0 g/dL   RDW 13.5 11.5 - 15.5 %   Platelets 173 150 - 400 K/uL    Comment: Performed at [facility], [address] [place], [place] [address]  Magnesium     Status: Abnormal   Collection Time: 10/19/17  5:25 AM  Result Value Ref Range   Magnesium 1.5 (L) 1.7 - 2.4 mg/dL    Comment: Performed at [facility], [address] [place], [place] [address]    Studies/Results: Dg Cholangiogram Operative  Result Date: 10/17/2017 CLINICAL DATA:  84-year-old female undergoing laparoscopic cholecystectomy EXAM: INTRAOPERATIVE CHOLANGIOGRAM TECHNIQUE: Cholangiographic images from the C-arm fluoroscopic device were submitted for interpretation post-operatively. Please see the procedural report for the amount of contrast and the fluoroscopy time utilized. COMPARISON:  Right upper quadrant ultrasound 10/15/2017 FINDINGS: A single intraoperative image is submitted. The images depict cannulation of the cystic duct remanent and opacification of the biliary tree. There is mild dilatation of the distal common bile duct. Multiple faceted filling defects are present within the distal common bile duct consistent with choledocholithiasis. Contrast passes through the ampulla and into the duodenum. IMPRESSION: 1. Positive for nonobstructing choledocholithiasis. 2. Contrast material does pass the stones and enter the duodenum. Electronically Signed   By: Heath  McCullough M.D.   On: 10/17/2017 12:27   Dg Ercp Biliary & Pancreatic Ducts  Result Date: 10/18/2017 CLINICAL DATA:  84-year-old female with choledocholithiasis EXAM: ERCP TECHNIQUE: Multiple spot images obtained with the fluoroscopic device and  submitted for interpretation post-procedure. FLUOROSCOPY TIME:  Fluoroscopy Time:  2 minutes 5 seconds COMPARISON:  Intraoperative cholangiogram 10/17/2017 FINDINGS: A total of 7 intraoperative saved images are obtained during ERCP.   The images demonstrate a flexible endoscope in the descending duodenum with wire cannulation of the right hepatic ducts followed by cholangiogram again demonstrating choledocholithiasis. Subsequent images show sphincterotomy and balloon sweeping of the common duct. IMPRESSION: 1. Choledocholithiasis. 2. ERCP with sphincterotomy and balloon sweeping of the common duct. These images were submitted for radiologic interpretation only. Please see the procedural report for the amount of contrast and the fluoroscopy time utilized. Electronically Signed   By: Heath  McCullough M.D.   On: 10/18/2017 15:08    Medications: I have reviewed the patient's current medications.  Assessment: 1. Multiple choledocholithiasis removed during ERCP yesterday. LFTs trending down( T bili 2-1.6, AST 718-331,AST 512-266,ALP 154-143) WBC trending down from 22.5-13.5  2. Gallstone pancreatitis-cholecystectomy performed in 10/17/17  3. Constipation-chronic   Plan: On IV Ringer's lactate at 100 mL an hour,and heart healthy diet. On IV Zosyn. On senna 1 tablet when necessary at bedtime and magnesium oral tablets.  Will get a chest x-ray of her right lower ribs?musculoskeletal injury. If able to tolerate diet and no further pain noted okay to discharge from GI standpoint. Discussed with patient and the family members at bedside      10/19/2017, 10:32 AM   Pager [phone] If no answer or after 5 PM call [phone] 

## 2017-10-19 NOTE — Progress Notes (Signed)
RN gave patient and family discharge orders, they stated understanding and did not have any further questions. Pt IV has been removed and is comfortable.

## 2017-10-28 DIAGNOSIS — R944 Abnormal results of kidney function studies: Secondary | ICD-10-CM | POA: Diagnosis not present

## 2017-10-28 DIAGNOSIS — K851 Biliary acute pancreatitis without necrosis or infection: Secondary | ICD-10-CM | POA: Diagnosis not present

## 2017-10-28 DIAGNOSIS — R790 Abnormal level of blood mineral: Secondary | ICD-10-CM | POA: Diagnosis not present

## 2017-10-28 DIAGNOSIS — D72829 Elevated white blood cell count, unspecified: Secondary | ICD-10-CM | POA: Diagnosis not present

## 2017-10-28 DIAGNOSIS — I10 Essential (primary) hypertension: Secondary | ICD-10-CM | POA: Diagnosis not present

## 2017-10-28 DIAGNOSIS — Z9049 Acquired absence of other specified parts of digestive tract: Secondary | ICD-10-CM | POA: Diagnosis not present

## 2017-12-09 DIAGNOSIS — R413 Other amnesia: Secondary | ICD-10-CM | POA: Diagnosis not present

## 2017-12-09 DIAGNOSIS — F419 Anxiety disorder, unspecified: Secondary | ICD-10-CM | POA: Diagnosis not present

## 2017-12-09 DIAGNOSIS — N183 Chronic kidney disease, stage 3 (moderate): Secondary | ICD-10-CM | POA: Diagnosis not present

## 2017-12-09 DIAGNOSIS — I129 Hypertensive chronic kidney disease with stage 1 through stage 4 chronic kidney disease, or unspecified chronic kidney disease: Secondary | ICD-10-CM | POA: Diagnosis not present

## 2017-12-09 DIAGNOSIS — H9193 Unspecified hearing loss, bilateral: Secondary | ICD-10-CM | POA: Diagnosis not present

## 2017-12-09 DIAGNOSIS — J301 Allergic rhinitis due to pollen: Secondary | ICD-10-CM | POA: Diagnosis not present

## 2017-12-26 NOTE — Progress Notes (Signed)
Office Visit Note  Patient: Donna Burch             Date of Birth: February 23, 1927           MRN: [id number]             PCP: Harlan Stains, MD Referring: Harlan Stains, MD Visit Date: 01/09/2018 Occupation: @GUAROCC @    Subjective:  Pain in hands and knees.   History of Present Illness: Donna Burch is a 84 y.o. female with history of inflammatory arthritis diagnosed few years ago.  She initially presented with synovitis in multiple joints and was treated with Plaquenil.  Eventually the swelling resolved and she came off the Plaquenil.  She states she continues to have some discomfort in her bilateral hands and knee joints.  She overall has been doing well without Plaquenil and does not want to take any medications.  Activities of Daily Living:  Patient reports morning stiffness for 24 hours.   Patient Reports nocturnal pain.  Difficulty dressing/grooming: Denies Difficulty climbing stairs: Reports Difficulty getting out of chair: Reports Difficulty using hands for taps, buttons, cutlery, and/or writing: Reports   Review of Systems  Constitutional: Positive for fatigue. Negative for night sweats, weight gain and weight loss.  HENT: Positive for mouth dryness. Negative for mouth sores, trouble swallowing, trouble swallowing and nose dryness.   Eyes: Positive for dryness. Negative for pain, redness and visual disturbance.  Respiratory: Negative for cough, shortness of breath and difficulty breathing.   Cardiovascular: Negative for chest pain, palpitations, hypertension, irregular heartbeat and swelling in legs/feet.  Gastrointestinal: Negative for abdominal pain, blood in stool, constipation and diarrhea.  Endocrine: Negative for increased urination.  Genitourinary: Positive for nocturia. Negative for pelvic pain and vaginal dryness.  Musculoskeletal: Positive for arthralgias, gait problem, joint pain and morning stiffness. Negative for joint swelling, myalgias, muscle  weakness, muscle tenderness and myalgias.  Skin: Negative for color change, rash, hair loss, skin tightness, ulcers and sensitivity to sunlight.  Allergic/Immunologic: Negative for susceptible to infections.  Neurological: Positive for weakness. Negative for dizziness, light-headedness, headaches, memory loss and night sweats.  Hematological: Negative for bruising/bleeding tendency and swollen glands.  Psychiatric/Behavioral: Negative for depressed mood, confusion and sleep disturbance. The patient is not nervous/anxious.     PMFS History:  Patient Active Problem List   Diagnosis Date Noted  . Primary osteoarthritis of both hands 01/09/2018  . Common bile duct calculi   . Gallstone pancreatitis 10/16/2017  . Pancreatitis 10/15/2017  . Inflammatory arthritis 05/29/2016  . Pain in joint involving multiple sites 05/29/2016  . Screening-pulmonary TB 05/29/2016  . High risk medication use 04/25/2016  . Bradycardia   . Symptomatic bradycardia   . Junctional escape rhythm   . Orthostatic hypotension   . Thyroid activity decreased   . Hypomagnesemia   . Syncope 09/01/2015  . Nausea & vomiting 09/01/2015  . Hyponatremia 09/01/2015  . Cough 09/01/2015  . Hypertension   . Hyperlipidemia   . GERD (gastroesophageal reflux disease)   . Hypothyroidism   . Gastroesophageal reflux disease without esophagitis   . CLL (chronic lymphocytic leukemia) ([place]) 07/14/2015  . Thyroid nodule, right 01/03/2015  . Thyroid nodule 01/03/2015  . Pelvic relaxation 04/08/2012  . Kidney stones 04/08/2012  . Recurrent UTI (urinary tract infection) 04/08/2012    Past Medical History:  Diagnosis Date  . Arthritis    "right middle finger; hands" (01/03/2015)  . CLL (chronic lymphocytic leukemia) ([place])   . Dyspnea on exertion  Improved with exercise  . Elevated brain natriuretic peptide (BNP) level    Slight  . Family history of adverse reaction to anesthesia    son, Marita Snellen, w/PONV  . Frequent UTI     "used to; none lately" (01/03/2015)  . Gastroenteritis 09/2010   c. dificile.  hospitalized in ICU x 3 days  . GERD (gastroesophageal reflux disease)   . Hiatal hernia   . History of Clostridium difficile ?03/2012   "after taking ATB for one of my UTI's"  . Hyperlipidemia   . Hypertension   . Hypothyroidism   . Nephrolithiasis   . Nocturia    x3  . Pneumonia    Required hospital stay at the time  . PONV (postoperative nausea and vomiting)   . Renal insufficiency   . Seasonal allergies   . Skin cancer    right anterior neck  . Wears glasses     Family History  Problem Relation Age of Onset  . Heart attack Mother   . Hyperlipidemia Mother   . Asthma Father   . Diabetes Father    Past Surgical History:  Procedure Laterality Date  . CATARACT EXTRACTION, BILATERAL  2010  . CHOLECYSTECTOMY N/A 10/17/2017   Procedure: LAPAROSCOPIC CHOLECYSTECTOMY WITH INTRAOPERATIVE CHOLANGIOGRAM;  Surgeon: Clovis Riley, MD;  Location: [facility];  Service: General;  Laterality: N/A;  . COLONOSCOPY    . CYSTOSCOPY W/ STONE MANIPULATION Right   . CYSTOSCOPY WITH STENT PLACEMENT    . ERCP N/A 10/18/2017   Procedure: ENDOSCOPIC RETROGRADE CHOLANGIOPANCREATOGRAPHY (ERCP);  Surgeon: Ronnette Juniper, MD;  Location: [facility];  Service: Gastroenterology;  Laterality: N/A;  . ESOPHAGOGASTRODUODENOSCOPY    . ESOPHAGOGASTRODUODENOSCOPY (EGD) WITH ESOPHAGEAL DILATION    . KNEE ARTHROSCOPY Left   . LITHOTRIPSY     "didn't work"  . SKIN CANCER EXCISION Right    anterior neck  . THYROID LOBECTOMY Right 01/03/2015  . THYROID LOBECTOMY Right 01/03/2015   Procedure: RIGHT THYROID LOBECTOMY;  Surgeon: Armandina Gemma, MD;  Location: [facility];  Service: General;  Laterality: Right;  . TONSILLECTOMY    . WISDOM TOOTH EXTRACTION     Social History   Social History Narrative  . Not on file     Objective: Vital Signs: BP (!) 152/71 (BP Location: Left Arm, Patient Position: Sitting, Cuff Size: Normal)   Pulse 72    Resp 13   Ht 5' 3.5" (1.613 m)   Wt 142 lb (64.4 kg)   BMI 24.76 kg/m    Physical Exam  Constitutional: She is oriented to person, place, and time. She appears well-developed and well-nourished.  HENT:  Head: Normocephalic and atraumatic.  Eyes: Conjunctivae and EOM are normal.  Neck: Normal range of motion.  Cardiovascular: Normal rate, regular rhythm, normal heart sounds and intact distal pulses.  Pulmonary/Chest: Effort normal and breath sounds normal.  Abdominal: Soft. Bowel sounds are normal.  Lymphadenopathy:    She has no cervical adenopathy.  Neurological: She is alert and oriented to person, place, and time.  Skin: Skin is warm and dry. Capillary refill takes less than 2 seconds.  Psychiatric: She has a normal mood and affect. Her behavior is normal.  Nursing note and vitals reviewed.    Musculoskeletal Exam: She has severe thoracic kyphosis.  Shoulder joints elbow joints were in good range of motion.  She has second MCP thickening with no synovitis.  She has DIP PIP thickening in her bilateral hands.  Hip joints were difficult to assess in the sitting  position.  She has some discomfort range of motion of bilateral knee joint and warmth without effusion.  She has DIP PIP changes in her feet without synovitis.   CDAI Exam: No CDAI exam completed.    Investigation: No additional findings. CBC Latest Ref Rng & Units 10/19/2017 10/18/2017 10/17/2017  WBC 4.0 - 10.5 K/uL 13.5(H) 22.5(H) 9.9  Hemoglobin 12.0 - 15.0 g/dL 10.5(L) 10.3(L) 11.0(L)  Hematocrit 36.0 - 46.0 % 31.6(L) 30.3(L) 31.5(L)  Platelets 150 - 400 K/uL 173 184 255   CMP Latest Ref Rng & Units 10/19/2017 10/19/2017 10/18/2017  Glucose 65 - 99 mg/dL 122(H) 82 110(H)  BUN 6 - 20 mg/dL 15 16 18   Creatinine 0.44 - 1.00 mg/dL 1.33(H) 1.37(H) 1.47(H)  Sodium 135 - 145 mmol/L 134(L) 135 134(L)  Potassium 3.5 - 5.1 mmol/L 3.8 3.3(L) 3.6  Chloride 101 - 111 mmol/L 100(L) 105 101  CO2 22 - 32 mmol/L 22 24 23   Calcium  8.9 - 10.3 mg/dL 8.3(L) 8.3(L) 8.4(L)  Total Protein 6.5 - 8.1 g/dL - 6.5 6.5  Total Bilirubin 0.3 - 1.2 mg/dL - 1.6(H) 2.0(H)  Alkaline Phos 38 - 126 U/L - 143(H) 154(H)  AST 15 - 41 U/L - 331(H) 718(H)  ALT 14 - 54 U/L - 266(H) 512(H)    Imaging: No results found.  Speciality Comments: No specialty comments available.    Procedures:  No procedures performed Allergies: Prednisone; Reclast [zoledronic acid]; Hydrochlorothiazide; and Lisinopril   Assessment / Plan:     Visit Diagnoses: Inflammatory arthritis-patient initially presented with inflammatory polyarthritis with synovitis in multiple joints and was treated with Plaquenil for few years.  She has been off Plaquenil and has been doing well without synovitis.  Primary osteoarthritis of both hands-she has osteoarthritis in her hands for with joint protection muscle strengthening was discussed.  Primary osteoarthritis of both knees-she has severe osteoarthritis in bilateral knee joints which causes discomfort.  We discussed possible use of Voltaren gel topically.  Indications side effects contraindications were discussed and prescription was called in.  Primary osteoarthritis of both feet-she is not having much discomfort in her feet.  Other medical problems are listed as follows.  CLL (chronic lymphocytic leukemia) ([place]) - She sees Dr. Irene Limbo.  Hyponatremia  Gastroesophageal reflux disease without esophagitis  Symptomatic bradycardia  Other specified hypothyroidism  Essential hypertension    Orders: No orders of the defined types were placed in this encounter.  Meds ordered this encounter  Medications  . diclofenac sodium (VOLTAREN) 1 % GEL    Sig: Apply 3 gm to 3 large joints up to 3 times a day.Dispense 3 tubes with 3 refills.    Dispense:  3 Tube    Refill:  0    Patient has been was advised to return on PRN basis.  If she develops increased swelling she is supposed to notify me.  Follow-Up Instructions:  Return if symptoms worsen or fail to improve, for Osteoarthritis.   Bo Merino, MD  Note - This record has been created using Editor, commissioning.  Chart creation errors have been sought, but may not always  have been located. Such creation errors do not reflect on  the standard of medical care.

## 2018-01-09 ENCOUNTER — Encounter: Payer: Self-pay | Admitting: Physician Assistant

## 2018-01-09 ENCOUNTER — Ambulatory Visit (INDEPENDENT_AMBULATORY_CARE_PROVIDER_SITE_OTHER): Payer: Medicare Other | Admitting: Physician Assistant

## 2018-01-09 VITALS — BP 152/71 | HR 72 | Resp 13 | Ht 63.5 in | Wt 142.0 lb

## 2018-01-09 DIAGNOSIS — E038 Other specified hypothyroidism: Secondary | ICD-10-CM | POA: Diagnosis not present

## 2018-01-09 DIAGNOSIS — R001 Bradycardia, unspecified: Secondary | ICD-10-CM

## 2018-01-09 DIAGNOSIS — C9190 Lymphoid leukemia, unspecified not having achieved remission: Secondary | ICD-10-CM | POA: Diagnosis not present

## 2018-01-09 DIAGNOSIS — I10 Essential (primary) hypertension: Secondary | ICD-10-CM | POA: Diagnosis not present

## 2018-01-09 DIAGNOSIS — K219 Gastro-esophageal reflux disease without esophagitis: Secondary | ICD-10-CM | POA: Diagnosis not present

## 2018-01-09 DIAGNOSIS — M19072 Primary osteoarthritis, left ankle and foot: Secondary | ICD-10-CM | POA: Diagnosis not present

## 2018-01-09 DIAGNOSIS — M19041 Primary osteoarthritis, right hand: Secondary | ICD-10-CM | POA: Insufficient documentation

## 2018-01-09 DIAGNOSIS — M19042 Primary osteoarthritis, left hand: Secondary | ICD-10-CM

## 2018-01-09 DIAGNOSIS — M1990 Unspecified osteoarthritis, unspecified site: Secondary | ICD-10-CM

## 2018-01-09 DIAGNOSIS — E871 Hypo-osmolality and hyponatremia: Secondary | ICD-10-CM | POA: Diagnosis not present

## 2018-01-09 DIAGNOSIS — C9110 Chronic lymphocytic leukemia of B-cell type not having achieved remission: Secondary | ICD-10-CM

## 2018-01-09 DIAGNOSIS — M19071 Primary osteoarthritis, right ankle and foot: Secondary | ICD-10-CM

## 2018-01-09 DIAGNOSIS — M170 Bilateral primary osteoarthritis of knee: Secondary | ICD-10-CM | POA: Diagnosis not present

## 2018-01-09 MED ORDER — DICLOFENAC SODIUM 1 % TD GEL: TRANSDERMAL | 0 refills | Status: DC

## 2018-02-12 NOTE — Progress Notes (Signed)
[name]  HEMATOLOGY ONCOLOGY PROGRESS NOTE  Date of service: 02/13/18  Patient Care Team: Harlan Stains, MD as PCP - General (Family Medicine)  CC: f/u for continued mx of CLL  Diagnosis: CLL Rai Stage 0  Current Treatment: Observation  INTERVAL HISTORY:  [name] is here for management and evaluation of her CLL. The patient's last visit with [place] was on 08/15/17. She is accompanied today by her grandson. The pt reports that she is doing well overall.   The pt reports that her arthritis has been her biggest concern and she has had some gel shots to address this. She denies taking steroids in the interim.   The pt did have her third shingles outbreak in the last six months, first two were several years ago, but notes that this has resolved. She does not wish to begin an anti-viral prophylactic medication.   The pt notes that her energy levels have been stable and denies any new constitutional symptoms. She denies noticing any new lumps or bumps, and continues eating well.   The pt will be seeing her PCP Dr. Dema Severin twice in the next year.   Lab results today (02/13/18) of CBC w/diff, CMP, and Reticulocytes is as follows: all values are WNL except for WBC at 10.6k, RBC at 3.66, HGB at 11.0, HCT at 33.9, Lymphs abs at 6.3, Creatinine at 1.01, Albumin at 3.3, GFR at 48. LDH 02/13/18 is . Lab Results  Component Value Date   LDH 170 02/13/2018     On review of systems, pt reports arthritis related pain, stable energy levels, eating well, and denies fevers, chills, night sweats, unexpected weight loss, noticing any new lumps or bumps, abdominal pains, leg swelling, and any other symptoms.   REVIEW OF SYSTEMS:    A 10+ POINT REVIEW OF SYSTEMS WAS OBTAINED including neurology, dermatology, psychiatry, cardiac, respiratory, lymph, extremities, GI, GU, Musculoskeletal, constitutional, breasts, reproductive, HEENT.  All pertinent positives are noted in the HPI.  All others are negative.  . Past  Medical History:  Diagnosis Date  . Arthritis    "right middle finger; hands" (01/03/2015)  . CLL (chronic lymphocytic leukemia) ([place])   . Dyspnea on exertion    Improved with exercise  . Elevated brain natriuretic peptide (BNP) level    Slight  . Family history of adverse reaction to anesthesia    son, Marita Snellen, w/PONV  . Frequent UTI    "used to; none lately" (01/03/2015)  . Gastroenteritis 09/2010   c. dificile.  hospitalized in ICU x 3 days  . GERD (gastroesophageal reflux disease)   . Hiatal hernia   . History of Clostridium difficile ?03/2012   "after taking ATB for one of my UTI's"  . Hyperlipidemia   . Hypertension   . Hypothyroidism   . Nephrolithiasis   . Nocturia    x3  . Pneumonia    Required hospital stay at the time  . PONV (postoperative nausea and vomiting)   . Renal insufficiency   . Seasonal allergies   . Skin cancer    right anterior neck  . Wears glasses      Past Surgical History:  Procedure Laterality Date  . CATARACT EXTRACTION, BILATERAL  2010  . CHOLECYSTECTOMY N/A 10/17/2017   Procedure: LAPAROSCOPIC CHOLECYSTECTOMY WITH INTRAOPERATIVE CHOLANGIOGRAM;  Surgeon: Clovis Riley, MD;  Location: [facility];  Service: General;  Laterality: N/A;  . COLONOSCOPY    . CYSTOSCOPY W/ STONE MANIPULATION Right   . CYSTOSCOPY WITH STENT PLACEMENT    .  ERCP N/A 10/18/2017   Procedure: ENDOSCOPIC RETROGRADE CHOLANGIOPANCREATOGRAPHY (ERCP);  Surgeon: Ronnette Juniper, MD;  Location: [facility];  Service: Gastroenterology;  Laterality: N/A;  . ESOPHAGOGASTRODUODENOSCOPY    . ESOPHAGOGASTRODUODENOSCOPY (EGD) WITH ESOPHAGEAL DILATION    . KNEE ARTHROSCOPY Left   . LITHOTRIPSY     "didn't work"  . SKIN CANCER EXCISION Right    anterior neck  . THYROID LOBECTOMY Right 01/03/2015  . THYROID LOBECTOMY Right 01/03/2015   Procedure: RIGHT THYROID LOBECTOMY;  Surgeon: Armandina Gemma, MD;  Location: [facility];  Service: General;  Laterality: Right;  . TONSILLECTOMY    . WISDOM TOOTH  EXTRACTION      Social History   Tobacco Use  . Smoking status: Never Smoker  . Smokeless tobacco: Never Used  Substance Use Topics  . Alcohol use: No  . Drug use: Never    ALLERGIES:  is allergic to prednisone; reclast [zoledronic acid]; hydrochlorothiazide; and lisinopril.  MEDICATIONS:  Current Outpatient Medications  Medication Sig Dispense Refill  . aspirin 81 MG chewable tablet Take 81 mg by mouth daily.    . B Complex-C (SUPER B COMPLEX PO) Take by mouth daily.    . cholecalciferol (VITAMIN D) 1000 UNITS tablet Take 2,000 Units by mouth daily.     . diclofenac sodium (VOLTAREN) 1 % GEL Apply 3 gm to 3 large joints up to 3 times a day.Dispense 3 tubes with 3 refills. 3 Tube 0  . ferrous sulfate 325 (65 FE) MG EC tablet Take 325 mg by mouth daily with breakfast.    . levothyroxine (SYNTHROID, LEVOTHROID) 75 MCG tablet Take 75 mcg by mouth daily.    [name] lidocaine (LIDODERM) 5 % Place 1 patch onto the skin daily. Remove & Discard patch within 12 hours or as directed by MD 30 patch 0  . magnesium chloride (SLOW-MAG) 64 MG TBEC SR tablet Take 1 tablet by mouth 2 (two) times daily.     . Multiple Vitamins-Minerals (MULTIVITAMIN ADULTS PO) Take by mouth daily.    . Nutritional Supplements (COLON FORMULA PO) Take 1 capsule by mouth daily.     [name] omeprazole (PRILOSEC) 40 MG capsule Take 40 mg by mouth daily.     [name] oxyCODONE (OXY IR/ROXICODONE) 5 MG immediate release tablet Take 0.5 tablets (2.5 mg total) by mouth every 6 (six) hours as needed for up to 16 doses for severe pain. (Patient not taking: Reported on 01/09/2018) 16 tablet 0   No current facility-administered medications for this visit.     PHYSICAL EXAMINATION: ECOG PERFORMANCE STATUS: 2 - Symptomatic, <50% confined to bed   Vitals:   02/13/18 1123  BP: (!) 168/74  Pulse: 98  Resp: 18  Temp: 97.8 F (36.6 C)  SpO2: 100%    Filed Weights   02/13/18 1123  Weight: 143 lb 14.4 oz (65.3 kg)   .Body mass index is  25.09 kg/m.  GENERAL:alert, in no acute distress and comfortable SKIN: no acute rashes, no significant lesions EYES: conjunctiva are pink and non-injected, sclera anicteric OROPHARYNX: MMM, no exudates, no oropharyngeal erythema or ulceration NECK: supple, no JVD LYMPH:  no palpable lymphadenopathy in the cervical, axillary or inguinal regions LUNGS: clear to auscultation b/l with normal respiratory effort HEART: regular rate & rhythm ABDOMEN:  normoactive bowel sounds , non tender, not distended. No palpable hepatosplenomegaly.  Extremity: no pedal edema PSYCH: alert & oriented x 3 with fluent speech NEURO: no focal motor/sensory deficits   LABORATORY DATA:   I have reviewed the  data as listed  . CBC Latest Ref Rng & Units 02/13/2018 10/19/2017 10/18/2017  WBC 3.9 - 10.3 K/uL 10.6(H) 13.5(H) 22.5(H)  Hemoglobin 11.6 - 15.9 g/dL 11.0(L) 10.5(L) 10.3(L)  Hematocrit 34.8 - 46.6 % 33.9(L) 31.6(L) 30.3(L)  Platelets 145 - 400 K/uL 234 173 184   . CBC    Component Value Date/Time   WBC 10.6 (H) 02/13/2018 1041   WBC 13.5 (H) 10/19/2017 0525   RBC 3.66 (L) 02/13/2018 1041   RBC 3.66 (L) 02/13/2018 1041   HGB 11.0 (L) 02/13/2018 1041   HGB 11.4 (L) 01/17/2017 1122   HCT 33.9 (L) 02/13/2018 1041   HCT 34.4 (L) 01/17/2017 1122   PLT 234 02/13/2018 1041   PLT 216 01/17/2017 1122   MCV 92.6 02/13/2018 1041   MCV 94.2 01/17/2017 1122   MCH 30.1 02/13/2018 1041   MCHC 32.4 02/13/2018 1041   RDW 14.0 02/13/2018 1041   RDW 13.6 01/17/2017 1122   LYMPHSABS 6.3 (H) 02/13/2018 1041   LYMPHSABS 7.6 (H) 01/17/2017 1122   MONOABS 0.7 02/13/2018 1041   MONOABS 0.7 01/17/2017 1122   EOSABS 0.2 02/13/2018 1041   EOSABS 0.1 01/17/2017 1122   BASOSABS 0.0 02/13/2018 1041   BASOSABS 0.0 01/17/2017 1122     . CMP Latest Ref Rng & Units 02/13/2018 10/19/2017 10/19/2017  Glucose 70 - 99 mg/dL 95 122(H) 82  BUN 8 - 23 mg/dL 22 15 16   Creatinine 0.44 - 1.00 mg/dL 1.01(H) 1.33(H) 1.37(H)    Sodium 135 - 145 mmol/L 142 134(L) 135  Potassium 3.5 - 5.1 mmol/L 4.3 3.8 3.3(L)  Chloride 98 - 111 mmol/L 110 100(L) 105  CO2 22 - 32 mmol/L 26 22 24   Calcium 8.9 - 10.3 mg/dL 9.4 8.3(L) 8.3(L)  Total Protein 6.5 - 8.1 g/dL 7.6 - 6.5  Total Bilirubin 0.3 - 1.2 mg/dL 0.3 - 1.6(H)  Alkaline Phos 38 - 126 U/L 84 - 143(H)  AST 15 - 41 U/L 23 - 331(H)  ALT 0 - 44 U/L 11 - 266(H)     RADIOGRAPHIC STUDIES: I have personally reviewed the radiological images as listed and agreed with the findings in the report. No results found.  ASSESSMENT & PLAN:   84 y.o. female with multiple medical comorbidities as noted above.  1) CLL. Rai stage 0. FISH panel shows 13q deletion -likely representing good prognostic indicator Patient has no clinically discernible lymphadenopathy, splenomegaly. Stable chronic mild anemia which remains stable but this seems to be less likely from CLL. Given her pelgeroid neutrophils and acanthocytes cannot rule out some element of age-related myelodysplasia. Also little has element of ACD due to her RA. Patient has no overt constitutional symptoms such as fevers/chills/drenching night sweats/weight loss more than 10% of body weight. WBC counts today are normal at 9.7k  No thrombocytopenia Hgb relatively stable at 11  PLAN:  -Continue follow-up with primary care physician for management of her other medical co-morbidities. -patient is aware of concerning symptoms that might suggest evidence of progression of CLL and was recommended to call us with these if present. -Continue follow with rheumatology to address her joint pain issues and rheumatoid arthritis. -Discussed pt labwork today, 02/13/18; WBC at 10.6k with Lymphs at 6.3k, stable HGB at 11.0 -The pt shows no clinical or lab progression of CLL at this time.  -No indication for initiating active treatment at this time.   -Discussed the recommendation to begin low dose Acyclovir for repeated shingles prevention, pt  prefers not to start  this -Recommend PCP Dr. Harlan Stains recheck counts in 6 months and consult [place] if any constitutional symptoms or blood count abnormalities develop -Will see the pt back in 12 months, sooner if any new concerns    RTC with Dr Irene Limbo in 12 months with labs     The total time spent in the appt was 20 minutes and more than 50% was on counseling and direct patient cares.     Sullivan Lone MD MS AAHIVMS [facility] Hematology/Oncology Physician [facility]  (Office):       [phone] (Work cell):  [phone] (Fax):           [phone]  I, Baldwin [place], am acting as a scribe for Dr. Irene Limbo  .I have reviewed the above documentation for accuracy and completeness, and I agree with the above. Brunetta Genera MD

## 2018-02-13 ENCOUNTER — Inpatient Hospital Stay: Payer: Medicare Other | Attending: Hematology | Admitting: Hematology

## 2018-02-13 ENCOUNTER — Telehealth: Payer: Self-pay

## 2018-02-13 ENCOUNTER — Encounter: Payer: Self-pay | Admitting: Hematology

## 2018-02-13 ENCOUNTER — Inpatient Hospital Stay: Payer: Medicare Other

## 2018-02-13 VITALS — BP 168/74 | HR 98 | Temp 97.8°F | Resp 18 | Ht 63.5 in | Wt 143.9 lb

## 2018-02-13 DIAGNOSIS — C9110 Chronic lymphocytic leukemia of B-cell type not having achieved remission: Secondary | ICD-10-CM

## 2018-02-13 DIAGNOSIS — M2550 Pain in unspecified joint: Secondary | ICD-10-CM | POA: Insufficient documentation

## 2018-02-13 LAB — CBC WITH DIFFERENTIAL (CANCER CENTER ONLY)
BASOS PCT: 0 %
Basophils Absolute: 0 10*3/uL (ref 0.0–0.1)
EOS ABS: 0.2 10*3/uL (ref 0.0–0.5)
Eosinophils Relative: 2 %
HEMATOCRIT: 33.9 % — AB (ref 34.8–46.6)
HEMOGLOBIN: 11 g/dL — AB (ref 11.6–15.9)
Lymphocytes Relative: 60 %
Lymphs Abs: 6.3 10*3/uL — ABNORMAL HIGH (ref 0.9–3.3)
MCH: 30.1 pg (ref 25.1–34.0)
MCHC: 32.4 g/dL (ref 31.5–36.0)
MCV: 92.6 fL (ref 79.5–101.0)
MONOS PCT: 7 %
Monocytes Absolute: 0.7 10*3/uL (ref 0.1–0.9)
NEUTROS ABS: 3.3 10*3/uL (ref 1.5–6.5)
NEUTROS PCT: 31 %
Platelet Count: 234 10*3/uL (ref 145–400)
RBC: 3.66 MIL/uL — AB (ref 3.70–5.45)
RDW: 14 % (ref 11.2–14.5)
WBC: 10.6 10*3/uL — AB (ref 3.9–10.3)

## 2018-02-13 LAB — RETICULOCYTES
RBC.: 3.66 MIL/uL — AB (ref 3.70–5.45)
RETIC COUNT ABSOLUTE: 29.3 10*3/uL — AB (ref 33.7–90.7)
Retic Ct Pct: 0.8 % (ref 0.7–2.1)

## 2018-02-13 LAB — CMP (CANCER CENTER ONLY)
ALK PHOS: 84 U/L (ref 38–126)
ALT: 11 U/L (ref 0–44)
ANION GAP: 6 (ref 5–15)
AST: 23 U/L (ref 15–41)
Albumin: 3.3 g/dL — ABNORMAL LOW (ref 3.5–5.0)
BUN: 22 mg/dL (ref 8–23)
CALCIUM: 9.4 mg/dL (ref 8.9–10.3)
CO2: 26 mmol/L (ref 22–32)
Chloride: 110 mmol/L (ref 98–111)
Creatinine: 1.01 mg/dL — ABNORMAL HIGH (ref 0.44–1.00)
GFR, EST NON AFRICAN AMERICAN: 48 mL/min — AB (ref >60)
GFR, Est AFR Am: 56 mL/min — ABNORMAL LOW (ref >60)
Glucose, Bld: 95 mg/dL (ref 70–99)
Potassium: 4.3 mmol/L (ref 3.5–5.1)
SODIUM: 142 mmol/L (ref 135–145)
Total Bilirubin: 0.3 mg/dL (ref 0.3–1.2)
Total Protein: 7.6 g/dL (ref 6.5–8.1)

## 2018-02-13 LAB — LACTATE DEHYDROGENASE: LDH: 170 U/L (ref 98–192)

## 2018-02-13 NOTE — Telephone Encounter (Signed)
Printed avs and calender of upcoming appointment. Per 8/23 los 

## 2018-03-02 ENCOUNTER — Telehealth: Payer: Self-pay | Admitting: Rheumatology

## 2018-03-02 NOTE — Progress Notes (Signed)
Office Visit Note  Patient: Donna Burch             Date of Birth: [birth year]           MRN: [id number]             PCP: Harlan Stains, MD Referring: Harlan Stains, MD Visit Date: 03/06/2018 Occupation: @GUAROCC @  Subjective:  Bilateral knee pain   History of Present Illness: Donna Burch is a 84 y.o. female with history of inflammatory arthritis and osteoarthritis. She reports she is having severe pain in both knee joints.  She states the pain is most severe in the right knee.  She states she is having significant difficulty getting up and down from a chair.  She states she is also having difficulty straightening her right knee.  She states that she notices swelling in both knees by the end of the day.  She states she is also noticed increased stiffness.  She states that she has tried ice and heat but neither provide pain relief.  She denies any injuries or falls recently.  She states she continues to have discomfort in bilateral hands but denies any joint swelling.  She denies any other joint pain or joint swelling at this time.  Activities of Daily Living:  Patient reports morning stiffness  all day.   Patient Denies nocturnal pain.  Difficulty dressing/grooming: Denies Difficulty climbing stairs: Reports Difficulty getting out of chair: Reports Difficulty using hands for taps, buttons, cutlery, and/or writing: Reports  Review of Systems  Constitutional: Positive for fatigue.  HENT: Positive for mouth dryness. Negative for mouth sores and nose dryness.   Eyes: Positive for dryness. Negative for pain and visual disturbance.  Respiratory: Negative for cough, hemoptysis, shortness of breath and difficulty breathing.   Cardiovascular: Negative for chest pain, palpitations, hypertension and swelling in legs/feet.  Gastrointestinal: Negative for blood in stool, constipation and diarrhea.  Endocrine: Negative for increased urination.  Genitourinary: Negative for painful  urination.  Musculoskeletal: Positive for arthralgias, joint pain, joint swelling and morning stiffness. Negative for myalgias, muscle weakness, muscle tenderness and myalgias.  Skin: Negative for color change, pallor, rash, hair loss, nodules/bumps, skin tightness, ulcers and sensitivity to sunlight.  Allergic/Immunologic: Negative for susceptible to infections.  Neurological: Negative for dizziness, numbness, headaches and weakness.  Hematological: Negative for swollen glands.  Psychiatric/Behavioral: Negative for depressed mood and sleep disturbance. The patient is not nervous/anxious.     PMFS History:  Patient Active Problem List   Diagnosis Date Noted  . Primary osteoarthritis of both hands 01/09/2018  . Common bile duct calculi   . Gallstone pancreatitis 10/16/2017  . Pancreatitis 10/15/2017  . Inflammatory arthritis 05/29/2016  . Pain in joint involving multiple sites 05/29/2016  . Screening-pulmonary TB 05/29/2016  . High risk medication use 04/25/2016  . Bradycardia   . Symptomatic bradycardia   . Junctional escape rhythm   . Orthostatic hypotension   . Thyroid activity decreased   . Hypomagnesemia   . Syncope 09/01/2015  . Nausea & vomiting 09/01/2015  . Hyponatremia 09/01/2015  . Cough 09/01/2015  . Hypertension   . Hyperlipidemia   . GERD (gastroesophageal reflux disease)   . Hypothyroidism   . Gastroesophageal reflux disease without esophagitis   . CLL (chronic lymphocytic leukemia) ([place]) 07/14/2015  . Thyroid nodule, right 01/03/2015  . Thyroid nodule 01/03/2015  . Pelvic relaxation 04/08/2012  . Kidney stones 04/08/2012  . Recurrent UTI (urinary tract infection) 04/08/2012    Past Medical History:  Diagnosis Date  . Arthritis    "right middle finger; hands" (01/03/2015)  . CLL (chronic lymphocytic leukemia) ([place])   . Dyspnea on exertion    Improved with exercise  . Elevated brain natriuretic peptide (BNP) level    Slight  . Family history of adverse  reaction to anesthesia    son, Marita Snellen, w/PONV  . Frequent UTI    "used to; none lately" (01/03/2015)  . Gastroenteritis 09/2010   c. dificile.  hospitalized in ICU x 3 days  . GERD (gastroesophageal reflux disease)   . Hiatal hernia   . History of Clostridium difficile ?03/2012   "after taking ATB for one of my UTI's"  . Hyperlipidemia   . Hypertension   . Hypothyroidism   . Nephrolithiasis   . Nocturia    x3  . Pneumonia    Required hospital stay at the time  . PONV (postoperative nausea and vomiting)   . Renal insufficiency   . Seasonal allergies   . Skin cancer    right anterior neck  . Wears glasses     Family History  Problem Relation Age of Onset  . Heart attack Mother   . Hyperlipidemia Mother   . Asthma Father   . Diabetes Father    Past Surgical History:  Procedure Laterality Date  . CATARACT EXTRACTION, BILATERAL  2010  . CHOLECYSTECTOMY N/A 10/17/2017   Procedure: LAPAROSCOPIC CHOLECYSTECTOMY WITH INTRAOPERATIVE CHOLANGIOGRAM;  Surgeon: Clovis Riley, MD;  Location: [facility];  Service: General;  Laterality: N/A;  . COLONOSCOPY    . CYSTOSCOPY W/ STONE MANIPULATION Right   . CYSTOSCOPY WITH STENT PLACEMENT    . ERCP N/A 10/18/2017   Procedure: ENDOSCOPIC RETROGRADE CHOLANGIOPANCREATOGRAPHY (ERCP);  Surgeon: Ronnette Juniper, MD;  Location: [facility];  Service: Gastroenterology;  Laterality: N/A;  . ESOPHAGOGASTRODUODENOSCOPY    . ESOPHAGOGASTRODUODENOSCOPY (EGD) WITH ESOPHAGEAL DILATION    . KNEE ARTHROSCOPY Left   . LITHOTRIPSY     "didn't work"  . SKIN CANCER EXCISION Right    anterior neck  . THYROID LOBECTOMY Right 01/03/2015  . THYROID LOBECTOMY Right 01/03/2015   Procedure: RIGHT THYROID LOBECTOMY;  Surgeon: Armandina Gemma, MD;  Location: [facility];  Service: General;  Laterality: Right;  . TONSILLECTOMY    . WISDOM TOOTH EXTRACTION     Social History   Social History Narrative  . Not on file    Objective: Vital Signs: BP (!) 151/81 (BP Location: Left  Arm, Patient Position: Sitting, Cuff Size: Normal)   Pulse 77   Resp 14   Ht 5\' 3"  (1.6 m)   Wt 144 lb 9.6 oz (65.6 kg)   BMI 25.61 kg/m    Physical Exam  Constitutional: She is oriented to person, place, and time. She appears well-developed and well-nourished.  HENT:  Head: Normocephalic and atraumatic.  Eyes: Conjunctivae and EOM are normal.  Neck: Normal range of motion.  Cardiovascular: Normal rate, regular rhythm, normal heart sounds and intact distal pulses.  Pulmonary/Chest: Effort normal and breath sounds normal.  Abdominal: Soft. Bowel sounds are normal.  Lymphadenopathy:    She has no cervical adenopathy.  Neurological: She is alert and oriented to person, place, and time.  Skin: Skin is warm and dry. Capillary refill takes less than 2 seconds.  Psychiatric: She has a normal mood and affect. Her behavior is normal.  Nursing note and vitals reviewed.    Musculoskeletal Exam: C-spine good range of motion.  Thoracic kyphosis noted.  Left shoulder abduction to 90  degrees.  Right shoulder full range of motion.  Bilateral elbow contractures.  Limited range of motion bilateral wrist joints but no tenderness noted.  She has synovial thickening of MCPs but no synovitis was noted.  She has PIP and DIP synovial thickening consistent with osteoarthritis of bilateral hands. CMC joint synovial thickening. She has contractures of several PIP joints.  She has no discomfort with hip range of motion.  She has limited extension with discomfort of her right knee joint.  Mild warmth noted of right knee.  No warmth or effusion noted of left knee joint.  She has pedal edema bilaterally.  No ankle joint tenderness or discomfort with range of motion.  CDAI Exam: CDAI Score: Not documented Patient Global Assessment: Not documented; Provider Global Assessment: Not documented Swollen: Not documented; Tender: Not documented Joint Exam   Not documented   There is currently no information documented on  the homunculus. Go to the Rheumatology activity and complete the homunculus joint exam.  Investigation: No additional findings.  Imaging: No results found.  Recent Labs: Lab Results  Component Value Date   WBC 10.6 (H) 02/13/2018   HGB 11.0 (L) 02/13/2018   PLT 234 02/13/2018   NA 142 02/13/2018   K 4.3 02/13/2018   CL 110 02/13/2018   CO2 26 02/13/2018   GLUCOSE 95 02/13/2018   BUN 22 02/13/2018   CREATININE 1.01 (H) 02/13/2018   BILITOT 0.3 02/13/2018   ALKPHOS 84 02/13/2018   AST 23 02/13/2018   ALT 11 02/13/2018   PROT 7.6 02/13/2018   ALBUMIN 3.3 (L) 02/13/2018   CALCIUM 9.4 02/13/2018   GFRAA 56 (L) 02/13/2018    Speciality Comments: No specialty comments available.  Procedures:  Large Joint Inj: R knee on 03/06/2018 10:32 AM Indications: pain Details: 27 G 1.5 in needle, medial approach  Arthrogram: No  Medications: 1.5 mL lidocaine 1 %; 40 mg triamcinolone acetonide 40 MG/ML Aspirate: 0 mL Outcome: tolerated well, no immediate complications Procedure, treatment alternatives, risks and benefits explained, specific risks discussed. Consent was given by the patient. Immediately prior to procedure a time out was called to verify the correct patient, procedure, equipment, support staff and site/side marked as required. Patient was prepped and draped in the usual sterile fashion.     Allergies: Prednisone; Reclast [zoledronic acid]; Hydrochlorothiazide; and Lisinopril   Assessment / Plan:     Visit Diagnoses: Inflammatory arthritis - She has no synovitis on exam.  She is no longer on PLQ since she has been doing well.  She continues to have discomfort in bilateral hands.  She has contractures as well as synovial thickening but no synovitis was noted.  Primary osteoarthritis of both hands: She has PIP and DIP synovial thickening consistent with osteoarthritis of bilateral hands.  She has bilateral CMC joint synovial thickening.  Joint protection and muscle  strengthening were discussed.  She is a prescription for Voltaren gel which she can apply topically if she is having increased discomfort in her hands.  Primary osteoarthritis of both knees: She is been having increased discomfort in bilateral knee joints.  She has limited extension with severe pain of her right knee.  She has mild warmth of the right knee on exam.  No warmth or effusion was noted of the left knee.  She requested a cortisone injection today in the office.  Her blood pressure was mildly elevated so we opted to only perform a right knee cortisone injection.  She tolerated the procedure well.  She  was advised to monitor her blood pressure closely following the cortisone injection today.  She was also advised to limit her salt intake.  All potential side effects were discussed.  She was advised to use Voltaren gel topically up to 3 times daily for pain relief.  We will apply for Visco supplementation of bilateral knee joints.  Chronic pain of right knee: She has warmth and limited extension with severe pain of her right knee.  She requested a cortisone injection today in the office.  She tolerated procedure well.  We will apply for Visco supplementation in the future.  Primary osteoarthritis of both feet: She has osteoarthritic changes in both feet.  She wears proper fitting shoes.   Other medical conditions are listed as follows:   CLL (chronic lymphocytic leukemia) ([facility])  Contracture of elbow joint, left  Contracture, elbow, right  Hyponatremia  Gastroesophageal reflux disease without esophagitis  Essential hypertension  Other specified hypothyroidism  History of hyperlipidemia  Vitamin D deficiency  Neuropathy   Orders: Orders Placed This Encounter  Procedures  . Large Joint Inj   No orders of the defined types were placed in this encounter.   Face-to-face time spent with patient was  minutes. Greater than 50% of time was spent in counseling and coordination of  care.  Follow-Up Instructions: Return in about 6 months (around 09/04/2018) for Inflammatory arthritis, Osteoarthritis.   Ofilia Neas, PA-C   I examined and evaluated the patient with Hazel Sams PA.  Patient has warmth swelling and discomfort in her right knee joint on examination today.  Per her request right knee joint was injected with cortisone.  Have advised her to monitor her blood pressure closely.  She has tried [employer] supplement injections in the past.  We will apply for Visco supplement injections again.  The plan of care was discussed as noted above.  Bo Merino, MD  Note - This record has been created using Editor, commissioning.  Chart creation errors have been sought, but may not always  have been located. Such creation errors do not reflect on  the standard of medical care.

## 2018-03-02 NOTE — Telephone Encounter (Signed)
Patient called stating she would like to schedule an appointment to have gel injections in her knees.  Patient states she is not sure which injections she has had in the past.

## 2018-03-02 NOTE — Telephone Encounter (Signed)
Patient states she was given gel injections by Dr. Berenice Primas in the past. Patient would like to have the injections again. Patient states she is having trouble getting up and down due to her knees. Patient has been scheduled for an appointment on 03/06/18 at 9:20 am.

## 2018-03-06 ENCOUNTER — Encounter: Payer: Self-pay | Admitting: Physician Assistant

## 2018-03-06 ENCOUNTER — Telehealth: Payer: Self-pay

## 2018-03-06 ENCOUNTER — Ambulatory Visit (INDEPENDENT_AMBULATORY_CARE_PROVIDER_SITE_OTHER): Payer: Medicare Other | Admitting: Physician Assistant

## 2018-03-06 VITALS — BP 151/81 | HR 77 | Resp 14 | Ht 63.0 in | Wt 144.6 lb

## 2018-03-06 DIAGNOSIS — Z8639 Personal history of other endocrine, nutritional and metabolic disease: Secondary | ICD-10-CM | POA: Diagnosis not present

## 2018-03-06 DIAGNOSIS — I10 Essential (primary) hypertension: Secondary | ICD-10-CM | POA: Diagnosis not present

## 2018-03-06 DIAGNOSIS — E559 Vitamin D deficiency, unspecified: Secondary | ICD-10-CM

## 2018-03-06 DIAGNOSIS — E038 Other specified hypothyroidism: Secondary | ICD-10-CM | POA: Diagnosis not present

## 2018-03-06 DIAGNOSIS — G629 Polyneuropathy, unspecified: Secondary | ICD-10-CM

## 2018-03-06 DIAGNOSIS — K219 Gastro-esophageal reflux disease without esophagitis: Secondary | ICD-10-CM

## 2018-03-06 DIAGNOSIS — M19041 Primary osteoarthritis, right hand: Secondary | ICD-10-CM | POA: Diagnosis not present

## 2018-03-06 DIAGNOSIS — G8929 Other chronic pain: Secondary | ICD-10-CM

## 2018-03-06 DIAGNOSIS — M19042 Primary osteoarthritis, left hand: Secondary | ICD-10-CM

## 2018-03-06 DIAGNOSIS — M1990 Unspecified osteoarthritis, unspecified site: Secondary | ICD-10-CM | POA: Diagnosis not present

## 2018-03-06 DIAGNOSIS — E871 Hypo-osmolality and hyponatremia: Secondary | ICD-10-CM | POA: Diagnosis not present

## 2018-03-06 DIAGNOSIS — M170 Bilateral primary osteoarthritis of knee: Secondary | ICD-10-CM

## 2018-03-06 DIAGNOSIS — M24521 Contracture, right elbow: Secondary | ICD-10-CM

## 2018-03-06 DIAGNOSIS — M25561 Pain in right knee: Secondary | ICD-10-CM

## 2018-03-06 DIAGNOSIS — C9110 Chronic lymphocytic leukemia of B-cell type not having achieved remission: Secondary | ICD-10-CM

## 2018-03-06 DIAGNOSIS — C9190 Lymphoid leukemia, unspecified not having achieved remission: Secondary | ICD-10-CM | POA: Diagnosis not present

## 2018-03-06 DIAGNOSIS — M19071 Primary osteoarthritis, right ankle and foot: Secondary | ICD-10-CM | POA: Diagnosis not present

## 2018-03-06 DIAGNOSIS — M24522 Contracture, left elbow: Secondary | ICD-10-CM | POA: Diagnosis not present

## 2018-03-06 DIAGNOSIS — M19072 Primary osteoarthritis, left ankle and foot: Secondary | ICD-10-CM

## 2018-03-06 MED ORDER — LIDOCAINE HCL 1 % IJ SOLN
1.5000 mL | INTRAMUSCULAR | Status: AC | PRN
  Administered 2018-03-06: 1.5 mL

## 2018-03-06 MED ORDER — TRIAMCINOLONE ACETONIDE 40 MG/ML IJ SUSP
40.0000 mg | INTRAMUSCULAR | Status: AC | PRN
  Administered 2018-03-06: 40 mg via INTRA_ARTICULAR

## 2018-03-06 NOTE — Telephone Encounter (Signed)
Please apply for bilateral knee visco, per Taylor. Thanks!  

## 2018-03-06 NOTE — Telephone Encounter (Signed)
Noted  

## 2018-03-10 ENCOUNTER — Telehealth (INDEPENDENT_AMBULATORY_CARE_PROVIDER_SITE_OTHER): Payer: Self-pay

## 2018-03-10 NOTE — Telephone Encounter (Signed)
Submitted VOB for Orthovisc series, bilateral knee. 

## 2018-03-12 DIAGNOSIS — E039 Hypothyroidism, unspecified: Secondary | ICD-10-CM | POA: Diagnosis not present

## 2018-03-12 DIAGNOSIS — Z23 Encounter for immunization: Secondary | ICD-10-CM | POA: Diagnosis not present

## 2018-03-12 DIAGNOSIS — Z8639 Personal history of other endocrine, nutritional and metabolic disease: Secondary | ICD-10-CM | POA: Diagnosis not present

## 2018-03-15 ENCOUNTER — Inpatient Hospital Stay (HOSPITAL_COMMUNITY)
Admission: EM | Admit: 2018-03-15 | Discharge: 2018-03-17 | DRG: 312 | Disposition: A | Discharge status: Home Health | Payer: Medicare Other | Attending: Internal Medicine | Admitting: Internal Medicine

## 2018-03-15 ENCOUNTER — Encounter (HOSPITAL_COMMUNITY): Payer: Self-pay | Admitting: Emergency Medicine

## 2018-03-15 ENCOUNTER — Emergency Department (HOSPITAL_COMMUNITY): Payer: Medicare Other

## 2018-03-15 DIAGNOSIS — S299XXA Unspecified injury of thorax, initial encounter: Secondary | ICD-10-CM | POA: Diagnosis not present

## 2018-03-15 DIAGNOSIS — I129 Hypertensive chronic kidney disease with stage 1 through stage 4 chronic kidney disease, or unspecified chronic kidney disease: Secondary | ICD-10-CM | POA: Diagnosis present

## 2018-03-15 DIAGNOSIS — M542 Cervicalgia: Secondary | ICD-10-CM | POA: Diagnosis not present

## 2018-03-15 DIAGNOSIS — R0689 Other abnormalities of breathing: Secondary | ICD-10-CM | POA: Diagnosis not present

## 2018-03-15 DIAGNOSIS — Z8249 Family history of ischemic heart disease and other diseases of the circulatory system: Secondary | ICD-10-CM

## 2018-03-15 DIAGNOSIS — C9110 Chronic lymphocytic leukemia of B-cell type not having achieved remission: Secondary | ICD-10-CM | POA: Diagnosis present

## 2018-03-15 DIAGNOSIS — R54 Age-related physical debility: Secondary | ICD-10-CM | POA: Diagnosis present

## 2018-03-15 DIAGNOSIS — N390 Urinary tract infection, site not specified: Secondary | ICD-10-CM | POA: Diagnosis present

## 2018-03-15 DIAGNOSIS — S0990XA Unspecified injury of head, initial encounter: Secondary | ICD-10-CM | POA: Diagnosis not present

## 2018-03-15 DIAGNOSIS — W19XXXA Unspecified fall, initial encounter: Secondary | ICD-10-CM | POA: Diagnosis not present

## 2018-03-15 DIAGNOSIS — N183 Chronic kidney disease, stage 3 (moderate): Secondary | ICD-10-CM | POA: Diagnosis present

## 2018-03-15 DIAGNOSIS — R0902 Hypoxemia: Secondary | ICD-10-CM | POA: Diagnosis not present

## 2018-03-15 DIAGNOSIS — E039 Hypothyroidism, unspecified: Secondary | ICD-10-CM | POA: Diagnosis present

## 2018-03-15 DIAGNOSIS — Z7982 Long term (current) use of aspirin: Secondary | ICD-10-CM

## 2018-03-15 DIAGNOSIS — I10 Essential (primary) hypertension: Secondary | ICD-10-CM | POA: Diagnosis present

## 2018-03-15 DIAGNOSIS — K219 Gastro-esophageal reflux disease without esophagitis: Secondary | ICD-10-CM | POA: Diagnosis present

## 2018-03-15 DIAGNOSIS — I5030 Unspecified diastolic (congestive) heart failure: Secondary | ICD-10-CM | POA: Diagnosis not present

## 2018-03-15 DIAGNOSIS — J181 Lobar pneumonia, unspecified organism: Secondary | ICD-10-CM | POA: Diagnosis present

## 2018-03-15 DIAGNOSIS — Z8349 Family history of other endocrine, nutritional and metabolic diseases: Secondary | ICD-10-CM | POA: Diagnosis not present

## 2018-03-15 DIAGNOSIS — R627 Adult failure to thrive: Secondary | ICD-10-CM | POA: Diagnosis present

## 2018-03-15 DIAGNOSIS — I959 Hypotension, unspecified: Secondary | ICD-10-CM | POA: Diagnosis not present

## 2018-03-15 DIAGNOSIS — Z66 Do not resuscitate: Secondary | ICD-10-CM | POA: Diagnosis present

## 2018-03-15 DIAGNOSIS — R55 Syncope and collapse: Secondary | ICD-10-CM | POA: Diagnosis not present

## 2018-03-15 DIAGNOSIS — C9190 Lymphoid leukemia, unspecified not having achieved remission: Secondary | ICD-10-CM | POA: Diagnosis not present

## 2018-03-15 DIAGNOSIS — Z825 Family history of asthma and other chronic lower respiratory diseases: Secondary | ICD-10-CM | POA: Diagnosis not present

## 2018-03-15 DIAGNOSIS — D509 Iron deficiency anemia, unspecified: Secondary | ICD-10-CM | POA: Diagnosis present

## 2018-03-15 DIAGNOSIS — Z833 Family history of diabetes mellitus: Secondary | ICD-10-CM | POA: Diagnosis not present

## 2018-03-15 DIAGNOSIS — R51 Headache: Secondary | ICD-10-CM | POA: Diagnosis not present

## 2018-03-15 DIAGNOSIS — E785 Hyperlipidemia, unspecified: Secondary | ICD-10-CM | POA: Diagnosis present

## 2018-03-15 DIAGNOSIS — S199XXA Unspecified injury of neck, initial encounter: Secondary | ICD-10-CM | POA: Diagnosis not present

## 2018-03-15 DIAGNOSIS — J189 Pneumonia, unspecified organism: Secondary | ICD-10-CM

## 2018-03-15 DIAGNOSIS — R0602 Shortness of breath: Secondary | ICD-10-CM | POA: Diagnosis not present

## 2018-03-15 LAB — CBC
HCT: 36.1 % (ref 36.0–46.0)
HCT: 34.7 % — ABNORMAL LOW (ref 36.0–46.0)
Hemoglobin: 11.6 g/dL — ABNORMAL LOW (ref 12.0–15.0)
Hemoglobin: 11.2 g/dL — ABNORMAL LOW (ref 12.0–15.0)
MCH: 30.3 pg (ref 26.0–34.0)
MCH: 30.4 pg (ref 26.0–34.0)
MCHC: 32.1 g/dL (ref 30.0–36.0)
MCHC: 32.3 g/dL (ref 30.0–36.0)
MCV: 94.3 fL (ref 78.0–100.0)
MCV: 94 fL (ref 78.0–100.0)
PLATELETS: 216 10*3/uL (ref 150–400)
PLATELETS: 202 10*3/uL (ref 150–400)
RBC: 3.83 MIL/uL — AB (ref 3.87–5.11)
RBC: 3.69 MIL/uL — AB (ref 3.87–5.11)
RDW: 13.3 % (ref 11.5–15.5)
RDW: 13.3 % (ref 11.5–15.5)
WBC: 16.3 10*3/uL — AB (ref 4.0–10.5)
WBC: 14.8 10*3/uL — AB (ref 4.0–10.5)

## 2018-03-15 LAB — CK: Total CK: 216 U/L (ref 38–234)

## 2018-03-15 LAB — URINALYSIS, ROUTINE W REFLEX MICROSCOPIC
BILIRUBIN URINE: NEGATIVE
Glucose, UA: NEGATIVE mg/dL
KETONES UR: NEGATIVE mg/dL
NITRITE: NEGATIVE
PROTEIN: NEGATIVE mg/dL
Specific Gravity, Urine: 1.01 (ref 1.005–1.030)
pH: 8 (ref 5.0–8.0)

## 2018-03-15 LAB — BASIC METABOLIC PANEL
Anion gap: 9 (ref 5–15)
BUN: 27 mg/dL — AB (ref 8–23)
CHLORIDE: 103 mmol/L (ref 98–111)
CO2: 23 mmol/L (ref 22–32)
CREATININE: 1.22 mg/dL — AB (ref 0.44–1.00)
Calcium: 9.6 mg/dL (ref 8.9–10.3)
GFR, EST AFRICAN AMERICAN: 44 mL/min — AB (ref >60)
GFR, EST NON AFRICAN AMERICAN: 38 mL/min — AB (ref >60)
Glucose, Bld: 121 mg/dL — ABNORMAL HIGH (ref 70–99)
Potassium: 4.4 mmol/L (ref 3.5–5.1)
SODIUM: 135 mmol/L (ref 135–145)

## 2018-03-15 LAB — CREATININE, SERUM
CREATININE: 1.1 mg/dL — AB (ref 0.44–1.00)
GFR calc Af Amer: 50 mL/min — ABNORMAL LOW (ref >60)
GFR calc non Af Amer: 43 mL/min — ABNORMAL LOW (ref >60)

## 2018-03-15 LAB — TSH: TSH: 2.418 u[IU]/mL (ref 0.350–4.500)

## 2018-03-15 MED ORDER — SODIUM CHLORIDE 0.9 % IV SOLN
1.0000 g | INTRAVENOUS | Status: DC
  Administered 2018-03-16 (×2): 1 g via INTRAVENOUS
  Filled 2018-03-15 (×3): qty 10

## 2018-03-15 MED ORDER — LEVOTHYROXINE SODIUM 75 MCG PO TABS
75.0000 ug | ORAL_TABLET | Freq: Every day | ORAL | Status: DC
  Administered 2018-03-16: 75 ug via ORAL
  Filled 2018-03-15: qty 1

## 2018-03-15 MED ORDER — ACETAMINOPHEN 325 MG PO TABS: 650.0000 mg | ORAL_TABLET | Freq: Four times a day (QID) | ORAL | Status: DC | PRN

## 2018-03-15 MED ORDER — HEPARIN SODIUM (PORCINE) 5000 UNIT/ML IJ SOLN
5000.0000 [IU] | Freq: Three times a day (TID) | INTRAMUSCULAR | Status: DC
  Administered 2018-03-16 – 2018-03-17 (×4): 5000 [IU] via SUBCUTANEOUS
  Filled 2018-03-15 (×4): qty 1

## 2018-03-15 MED ORDER — VITAMIN D 1000 UNITS PO TABS
2000.0000 [IU] | ORAL_TABLET | Freq: Every day | ORAL | Status: DC
  Administered 2018-03-16 – 2018-03-17 (×2): 2000 [IU] via ORAL
  Filled 2018-03-15 (×2): qty 2

## 2018-03-15 MED ORDER — LIDOCAINE 5 % EX PTCH
1.0000 | MEDICATED_PATCH | CUTANEOUS | Status: DC
  Administered 2018-03-16 – 2018-03-17 (×2): 1 via TRANSDERMAL
  Filled 2018-03-15 (×2): qty 1

## 2018-03-15 MED ORDER — FERROUS SULFATE 325 (65 FE) MG PO TABS
325.0000 mg | ORAL_TABLET | Freq: Every day | ORAL | Status: DC
  Administered 2018-03-16 – 2018-03-17 (×2): 325 mg via ORAL
  Filled 2018-03-15 (×2): qty 1

## 2018-03-15 MED ORDER — PANTOPRAZOLE SODIUM 40 MG PO TBEC
40.0000 mg | DELAYED_RELEASE_TABLET | Freq: Every day | ORAL | Status: DC
  Administered 2018-03-16 – 2018-03-17 (×2): 40 mg via ORAL
  Filled 2018-03-15 (×2): qty 1

## 2018-03-15 MED ORDER — ONDANSETRON HCL 4 MG/2ML IJ SOLN: 4.0000 mg | Freq: Four times a day (QID) | INTRAMUSCULAR | Status: DC | PRN

## 2018-03-15 MED ORDER — ONDANSETRON HCL 4 MG PO TABS: 4.0000 mg | ORAL_TABLET | Freq: Four times a day (QID) | ORAL | Status: DC | PRN

## 2018-03-15 MED ORDER — ASPIRIN 81 MG PO CHEW
81.0000 mg | CHEWABLE_TABLET | Freq: Every day | ORAL | Status: DC
  Administered 2018-03-16 – 2018-03-17 (×3): 81 mg via ORAL
  Filled 2018-03-15 (×4): qty 1

## 2018-03-15 MED ORDER — ADULT MULTIVITAMIN W/MINERALS CH
1.0000 | ORAL_TABLET | Freq: Every day | ORAL | Status: DC
  Administered 2018-03-16 – 2018-03-17 (×2): 1 via ORAL
  Filled 2018-03-15 (×2): qty 1

## 2018-03-15 MED ORDER — DICLOFENAC SODIUM 1 % TD GEL
2.0000 g | Freq: Four times a day (QID) | TRANSDERMAL | Status: DC
  Administered 2018-03-17: 2 g via TOPICAL
  Filled 2018-03-15 (×2): qty 100

## 2018-03-15 MED ORDER — LACTATED RINGERS IV SOLN
INTRAVENOUS | Status: AC
  Administered 2018-03-16: via INTRAVENOUS

## 2018-03-15 MED ORDER — ACETAMINOPHEN 650 MG RE SUPP: 650.0000 mg | Freq: Four times a day (QID) | RECTAL | Status: DC | PRN

## 2018-03-15 NOTE — ED Provider Notes (Signed)
[facility] EMERGENCY DEPARTMENT Provider Note   CSN: [id number] Arrival date & time: 03/15/18  1334     History   Chief Complaint No chief complaint on file.   HPI Donna Burch is a 84 y.o. female.  84 y.o female with a PMH of HTN, Arthritis,Hypothyroidism presents to the ED via EMS s/p syncope. Patient reports she was in the kitchen this morning around 9 am when she had a syncopal episode.  Patient reports she woke up in the kitchen floor and does not recall the incident, she reports loss of consciousness.  She woke up in the kitchen she dragged herself onto the living room which is carpeted requiring some blistering around her right wrist and right knee.  Patient had a flu shot on Thursday and ever since she received a shot she has not been feeling okay she reports she is been eating and drinking less.  Had a previous syncopal episode in 2017 she removed salt from her diet, she reports she has been cutting back on her salt intake this week.  And also received a visco shot on her right knee on Thursday.  Reports a cough along with some shortness of breath at rest "the older you get the harder it is to breathe". Denies any recent sickness, chest pain, abdominal pain, urinary symptoms. Patient denies any history of CAD, smoking history or seen a cardiologist in the past.      Past Medical History:  Diagnosis Date  . Arthritis    "right middle finger; hands" (01/03/2015)  . CLL (chronic lymphocytic leukemia) ([place])   . Dyspnea on exertion    Improved with exercise  . Elevated brain natriuretic peptide (BNP) level    Slight  . Family history of adverse reaction to anesthesia    son, Marita Snellen, w/PONV  . Frequent UTI    "used to; none lately" (01/03/2015)  . Gastroenteritis 09/2010   c. dificile.  hospitalized in ICU x 3 days  . GERD (gastroesophageal reflux disease)   . Hiatal hernia   . History of Clostridium difficile ?03/2012   "after taking ATB for one of my  UTI's"  . Hyperlipidemia   . Hypertension   . Hypothyroidism   . Nephrolithiasis   . Nocturia    x3  . Pneumonia    Required hospital stay at the time  . PONV (postoperative nausea and vomiting)   . Renal insufficiency   . Seasonal allergies   . Skin cancer    right anterior neck  . Wears glasses     Patient Active Problem List   Diagnosis Date Noted  . UTI (urinary tract infection) 03/15/2018  . Community acquired pneumonia 03/15/2018  . Syncope and collapse 03/15/2018  . Primary osteoarthritis of both hands 01/09/2018  . Common bile duct calculi   . Gallstone pancreatitis 10/16/2017  . Pancreatitis 10/15/2017  . Inflammatory arthritis 05/29/2016  . Pain in joint involving multiple sites 05/29/2016  . Screening-pulmonary TB 05/29/2016  . High risk medication use 04/25/2016  . Bradycardia   . Symptomatic bradycardia   . Junctional escape rhythm   . Orthostatic hypotension   . Thyroid activity decreased   . Hypomagnesemia   . Syncope 09/01/2015  . Nausea & vomiting 09/01/2015  . Hyponatremia 09/01/2015  . Cough 09/01/2015  . Hypertension   . Hyperlipidemia   . GERD (gastroesophageal reflux disease)   . Hypothyroidism   . Gastroesophageal reflux disease without esophagitis   . CLL (chronic lymphocytic leukemia) (  [place]) 07/14/2015  . Thyroid nodule, right 01/03/2015  . Thyroid nodule 01/03/2015  . Pelvic relaxation 04/08/2012  . Kidney stones 04/08/2012  . Recurrent UTI (urinary tract infection) 04/08/2012    Past Surgical History:  Procedure Laterality Date  . CATARACT EXTRACTION, BILATERAL  2010  . CHOLECYSTECTOMY N/A 10/17/2017   Procedure: LAPAROSCOPIC CHOLECYSTECTOMY WITH INTRAOPERATIVE CHOLANGIOGRAM;  Surgeon: Clovis Riley, MD;  Location: [facility];  Service: General;  Laterality: N/A;  . COLONOSCOPY    . CYSTOSCOPY W/ STONE MANIPULATION Right   . CYSTOSCOPY WITH STENT PLACEMENT    . ERCP N/A 10/18/2017   Procedure: ENDOSCOPIC RETROGRADE  CHOLANGIOPANCREATOGRAPHY (ERCP);  Surgeon: Ronnette Juniper, MD;  Location: [facility];  Service: Gastroenterology;  Laterality: N/A;  . ESOPHAGOGASTRODUODENOSCOPY    . ESOPHAGOGASTRODUODENOSCOPY (EGD) WITH ESOPHAGEAL DILATION    . KNEE ARTHROSCOPY Left   . LITHOTRIPSY     "didn't work"  . SKIN CANCER EXCISION Right    anterior neck  . THYROID LOBECTOMY Right 01/03/2015  . THYROID LOBECTOMY Right 01/03/2015   Procedure: RIGHT THYROID LOBECTOMY;  Surgeon: Armandina Gemma, MD;  Location: [facility];  Service: General;  Laterality: Right;  . TONSILLECTOMY    . WISDOM TOOTH EXTRACTION       OB History    Gravida  3   Para  3   Term      Preterm      AB      Living  3     SAB      TAB      Ectopic      Multiple      Live Births               Home Medications    Prior to Admission medications   Medication Sig Start Date End Date Taking? Authorizing Provider  aspirin 81 MG chewable tablet Take 81 mg by mouth daily.   Yes [provider]  Javier Docker Oil (OMEGA-3) 500 MG CAPS Take 500 mg by mouth daily.   Yes [provider]  levothyroxine (SYNTHROID, LEVOTHROID) 75 MCG tablet Take 37.5-75 mcg by mouth See admin instructions. Take 1 tablet (75 mcg) by mouth on Monday thru Friday mornings before breakfast and take 1/2 tablet (37.5 mcg) on Saturday and Sunday before breakfast   Yes [provider]  losartan (COZAAR) 100 MG tablet Take 100 mg by mouth daily.   Yes [provider]  omeprazole (PRILOSEC) 40 MG capsule Take 40 mg by mouth daily.    Yes [provider]  OVER THE COUNTER MEDICATION Apply 1 application topically 3 (three) times daily as needed (pain). [employer] Relief Muscle Cramp Spray   Yes [provider]  Probiotic Product (Vina) CAPS Take 1 capsule by mouth daily.   Yes [provider]  vitamin E 400 UNIT capsule Take 400 Units by mouth daily.   Yes [provider]    amoxicillin-clavulanate (AUGMENTIN) 500-125 MG tablet Take 1 tablet (500 mg total) by mouth 2 (two) times daily for 5 days. 03/17/18 03/22/18  Florencia Reasons, MD  cholecalciferol (VITAMIN D) 1000 units tablet Take 2 tablets (2,000 Units total) by mouth daily. 03/17/18   Florencia Reasons, MD  ferrous sulfate 325 (65 FE) MG EC tablet Take 1 tablet (325 mg total) by mouth daily with breakfast. 03/17/18   Florencia Reasons, MD  fluticasone [facility]) 50 MCG/ACT nasal spray Place 1 spray into both nostrils daily. 03/17/18   Florencia Reasons, MD  guaiFENesin (  MUCINEX) 600 MG 12 hr tablet Take 1 tablet (600 mg total) by mouth 2 (two) times daily. 03/17/18   Florencia Reasons, MD  magnesium gluconate (MAGONATE) 30 MG tablet Take 1 tablet (30 mg total) by mouth daily. 03/17/18   Florencia Reasons, MD  vitamin C (ASCORBIC ACID) 250 MG tablet Take 1 tablet (250 mg total) by mouth daily. 03/17/18   Florencia Reasons, MD    Family History Family History  Problem Relation Age of Onset  . Heart attack Mother   . Hyperlipidemia Mother   . Asthma Father   . Diabetes Father     Social History Social History   Tobacco Use  . Smoking status: Never Smoker  . Smokeless tobacco: Never Used  Substance Use Topics  . Alcohol use: No  . Drug use: Never     Allergies   Prednisone; Reclast [zoledronic acid]; Hydrochlorothiazide; and Lisinopril   Review of Systems Review of Systems  Constitutional: Negative for chills and fever.  HENT: Negative for sore throat.   Respiratory: Positive for shortness of breath. Negative for cough.   Cardiovascular: Negative for chest pain.  Gastrointestinal: Positive for diarrhea. Negative for abdominal pain, nausea and vomiting.  Genitourinary: Negative for dysuria and flank pain.  Musculoskeletal: Positive for arthralgias.  Skin: Positive for wound. Negative for rash.  Neurological: Positive for syncope. Negative for headaches.  All other systems reviewed and are negative.    Physical Exam Updated Vital Signs BP (!) 162/70  (BP Location: Right Arm)   Pulse 61   Temp 97.8 F (36.6 C) (Oral)   Resp 16   SpO2 97%   Physical Exam  Constitutional: She is oriented to person, place, and time. She appears well-developed and well-nourished.  HENT:  Head: Normocephalic and atraumatic.  Neck: Normal range of motion. Neck supple.  Cardiovascular: Normal heart sounds.  Pulmonary/Chest: Effort normal. She has decreased breath sounds. She has no wheezes. She exhibits no tenderness.  Decreased breath sounds throughout all lungs fields, lower > upper.   Abdominal: Soft. Bowel sounds are normal. There is no tenderness. There is no CVA tenderness and negative Murphy's sign.  Musculoskeletal: She exhibits tenderness.       Right knee: She exhibits erythema. She exhibits normal range of motion, no swelling, no effusion, no deformity and no laceration.  Neurological: She is alert and oriented to person, place, and time.  Skin: Skin is warm and dry. Abrasion noted.     Small blisters to right wrist.   Nursing note and vitals reviewed.    ED Treatments / Results  Labs (all labs ordered are listed, but only abnormal results are displayed) Labs Reviewed  URINE CULTURE - Abnormal; Notable for the following components:      Result Value   Culture   (*)    Value: >=100,000 COLONIES/mL KLEBSIELLA OXYTOCA SUSCEPTIBILITIES TO FOLLOW Performed at [facility], [address] [place], [place] [address]    All other components within normal limits  BASIC METABOLIC PANEL - Abnormal; Notable for the following components:   Glucose, Bld 121 (*)    BUN 27 (*)    Creatinine, Ser 1.22 (*)    GFR calc non Af Amer 38 (*)    GFR calc Af Amer 44 (*)    All other components within normal limits  CBC - Abnormal; Notable for the following components:   WBC 16.3 (*)    RBC 3.83 (*)    Hemoglobin 11.6 (*)    All  other components within normal limits  URINALYSIS, ROUTINE W REFLEX MICROSCOPIC - Abnormal; Notable for the following  components:   Color, Urine AMBER (*)    APPearance CLOUDY (*)    Hgb urine dipstick SMALL (*)    Leukocytes, UA MODERATE (*)    WBC, UA >50 (*)    Bacteria, UA FEW (*)    All other components within normal limits  CBC - Abnormal; Notable for the following components:   WBC 14.8 (*)    RBC 3.69 (*)    Hemoglobin 11.2 (*)    HCT 34.7 (*)    All other components within normal limits  CREATININE, SERUM - Abnormal; Notable for the following components:   Creatinine, Ser 1.10 (*)    GFR calc non Af Amer 43 (*)    GFR calc Af Amer 50 (*)    All other components within normal limits  BASIC METABOLIC PANEL - Abnormal; Notable for the following components:   CO2 19 (*)    Creatinine, Ser 1.06 (*)    GFR calc non Af Amer 45 (*)    GFR calc Af Amer 52 (*)    All other components within normal limits  CBC - Abnormal; Notable for the following components:   WBC 12.7 (*)    RBC 3.69 (*)    Hemoglobin 11.3 (*)    HCT 34.4 (*)    All other components within normal limits  CBC - Abnormal; Notable for the following components:   WBC 10.6 (*)    RBC 3.49 (*)    Hemoglobin 10.6 (*)    HCT 32.8 (*)    All other components within normal limits  BASIC METABOLIC PANEL - Abnormal; Notable for the following components:   Creatinine, Ser 1.09 (*)    GFR calc non Af Amer 44 (*)    GFR calc Af Amer 51 (*)    All other components within normal limits  MAGNESIUM - Abnormal; Notable for the following components:   Magnesium 1.5 (*)    All other components within normal limits  TSH  CK  STREP PNEUMONIAE URINARY ANTIGEN    EKG EKG Interpretation  Date/Time:  Sunday March 15 2018 13:55:01 EDT Ventricular Rate:  87 PR Interval:  148 QRS Duration: 76 QT Interval:  340 QTC Calculation: 409 R Axis:   36 Text Interpretation:  Sinus rhythm with Premature atrial complexes in a pattern of bigeminy Otherwise normal ECG since last tracing no significant change Confirmed by Malvin Johns [phone]) on  03/15/2018 3:35:47 PM   Radiology No results found.  Procedures Procedures (including critical care time)  Medications Ordered in ED Medications  lactated ringers infusion ( Intravenous New Bag/Given 03/16/18 0003)  magnesium sulfate IVPB 2 g 50 mL (2 g Intravenous New Bag/Given 03/17/18 0939)     Initial Impression / Assessment and Plan / ED Course  I have reviewed the triage vital signs and the nursing notes.  Pertinent labs & imaging results that were available during my care of the patient were reviewed by me and considered in my medical decision making (see chart for details).  Clinical Course as of Mar 18 1556  Sun Mar 15, 2018  1828 DG Chest 2 View [JS]    Clinical Course User Index [JS] Janeece Fitting, [place]    Patient presents with syncopal episode this morning.  Denies having any diaphoresis, lightheadedness prior to the syncope.  So far here her work-up has shown elevation of her white blood cell count  at 16.3 hemoglobin is 11.6, I have personally reviewed this patient's chart and show consistency with these previous levels.  UA shows small hemoglobin along with bacteria, white blood cells, leukocytes are moderate.  I obtain a CT head to rule out any intracranial hemorrhage CT head was negative along with a CT neck for possible fractures which was also negative.DG Chest showed subsegmental atelectasis or infiltrate laterally at the left lung base, new since prior study.  At this time patient's vitals have remained stable she is a little tachycardic HR 98.  Patient lives at home alone and had to drag her body across the living room to get help, I believe she needs to be admitted for further workup as she has no seen a cardiologist recently and has no previous cardiac workup in the chat that I have reviewed. UA does show leukocytes, WBC and bacteria but patient does not have urinary symptoms during interview.  EKG showed no STEMI. I will place call to hospitalist for further  admission.   6:50 PM Call placed to triad hospitalist   Final Clinical Impressions(s) / ED Diagnoses   Final diagnoses:  Syncope, unspecified syncope type    ED Discharge Orders         Ordered    Increase activity slowly     03/17/18 0826    Diet - low sodium heart healthy     03/17/18 0826    ferrous sulfate 325 (65 FE) MG EC tablet  Daily with breakfast     03/17/18 0853    cholecalciferol (VITAMIN D) 1000 units tablet  Daily     03/17/18 0853    fluticasone (FLONASE) 50 MCG/ACT nasal spray  Daily     03/17/18 0853    guaiFENesin (MUCINEX) 600 MG 12 hr tablet  2 times daily     03/17/18 0853    vitamin C (ASCORBIC ACID) 250 MG tablet  Daily     03/17/18 0854    amoxicillin-clavulanate (AUGMENTIN) 500-125 MG tablet  2 times daily     03/17/18 0855    magnesium gluconate (MAGONATE) 30 MG tablet  Daily     03/17/18 0855           Janeece Fitting, PA-C 03/18/18 1557    Malvin Johns, MD 03/23/18 [phone]

## 2018-03-15 NOTE — ED Notes (Signed)
Patient able to ambulate with staff assistance.  States arthritic pain in bilateral knees makes it difficult for her to get up and get back down, but while on feet, patient stable.  Patient has concerns and fears of falling during process.  Admitting now at bedside

## 2018-03-15 NOTE — H&P (Signed)
History and Physical    DAMA [name] DOB: Dec 01, 1926 DOA: 03/15/2018  PCP: Harlan Stains, MD   Patient coming from: Home  Chief Complaint: Syncopal episode  HPI: Donna Burch is a 84 y.o. female with medical history significant for gallstone pancreatitis, hypertension, CKD stage III, hypothyroidism, GERD, iron deficiency anemia, CLL, and prior noted orthostatic hypotension who was brought to the ED via EMS after she was noted to have a syncopal episode earlier this morning.  She apparently lives by herself and had to crawl across the floor to reach the phone to call 911 since her arthritis is so debilitating.  She denies any sensation of warmth, nausea, palpitations, dyspnea, chest pain or other symptomatology prior to the episode.  She states that much of this occurred suddenly.  Notably, her appetite has been diminished over the last several weeks and she has had little food intake or fluid intake according to the son at the bedside.  She does, however take her medications as prescribed.   ED Course: Vital signs were noted to be stable and laboratory data demonstrated WBC count of 16,300.  Hemoglobin stable at 11.6.  Creatinine is stable at 1.22 near baseline of 1-1.3, consistent with stage III CKD.  CT of the head and neck with no acute findings noted.  2 view chest x-ray with what appears to be left lung infiltrate versus atelectasis.  Urinalysis consistent with UTI.  EKG with sinus rhythm at 87 bpm.  Review of Systems: All others reviewed and otherwise negative.  Past Medical History:  Diagnosis Date  . Arthritis    "right middle finger; hands" (01/03/2015)  . CLL (chronic lymphocytic leukemia) ([place])   . Dyspnea on exertion    Improved with exercise  . Elevated brain natriuretic peptide (BNP) level    Slight  . Family history of adverse reaction to anesthesia    son, Marita Snellen, w/PONV  . Frequent UTI    "used to; none lately" (01/03/2015)  . Gastroenteritis 09/2010   c. dificile.  hospitalized in ICU x 3 days  . GERD (gastroesophageal reflux disease)   . Hiatal hernia   . History of Clostridium difficile ?03/2012   "after taking ATB for one of my UTI's"  . Hyperlipidemia   . Hypertension   . Hypothyroidism   . Nephrolithiasis   . Nocturia    x3  . Pneumonia    Required hospital stay at the time  . PONV (postoperative nausea and vomiting)   . Renal insufficiency   . Seasonal allergies   . Skin cancer    right anterior neck  . Wears glasses     Past Surgical History:  Procedure Laterality Date  . CATARACT EXTRACTION, BILATERAL  2010  . CHOLECYSTECTOMY N/A 10/17/2017   Procedure: LAPAROSCOPIC CHOLECYSTECTOMY WITH INTRAOPERATIVE CHOLANGIOGRAM;  Surgeon: Clovis Riley, MD;  Location: [facility];  Service: General;  Laterality: N/A;  . COLONOSCOPY    . CYSTOSCOPY W/ STONE MANIPULATION Right   . CYSTOSCOPY WITH STENT PLACEMENT    . ERCP N/A 10/18/2017   Procedure: ENDOSCOPIC RETROGRADE CHOLANGIOPANCREATOGRAPHY (ERCP);  Surgeon: Ronnette Juniper, MD;  Location: [facility];  Service: Gastroenterology;  Laterality: N/A;  . ESOPHAGOGASTRODUODENOSCOPY    . ESOPHAGOGASTRODUODENOSCOPY (EGD) WITH ESOPHAGEAL DILATION    . KNEE ARTHROSCOPY Left   . LITHOTRIPSY     "didn't work"  . SKIN CANCER EXCISION Right    anterior neck  . THYROID LOBECTOMY Right 01/03/2015  . THYROID LOBECTOMY Right 01/03/2015   Procedure:  RIGHT THYROID LOBECTOMY;  Surgeon: Armandina Gemma, MD;  Location: [facility];  Service: General;  Laterality: Right;  . TONSILLECTOMY    . WISDOM TOOTH EXTRACTION       reports that she has never smoked. She has never used smokeless tobacco. She reports that she does not drink alcohol or use drugs.  Allergies  Allergen Reactions  . Prednisone Other (See Comments)    "Jittery"  . Reclast [Zoledronic Acid] Other (See Comments)    Pt did not have a memory, from the day after she took that medication   . Hydrochlorothiazide Other (See Comments)  .  Lisinopril Cough    Family History  Problem Relation Age of Onset  . Heart attack Mother   . Hyperlipidemia Mother   . Asthma Father   . Diabetes Father     Prior to Admission medications   Medication Sig Start Date End Date Taking? Authorizing Provider  aspirin 81 MG chewable tablet Take 81 mg by mouth daily.    [provider]  B Complex-C (SUPER B COMPLEX PO) Take by mouth daily.    [provider]  cholecalciferol (VITAMIN D) 1000 UNITS tablet Take 2,000 Units by mouth daily.     [provider]  diclofenac sodium (VOLTAREN) 1 % GEL Apply 3 gm to 3 large joints up to 3 times a day.Dispense 3 tubes with 3 refills. 01/09/18   Bo Merino, MD  ferrous sulfate 325 (65 FE) MG EC tablet Take 325 mg by mouth daily with breakfast.    [provider]  levothyroxine (SYNTHROID, LEVOTHROID) 75 MCG tablet Take 75 mcg by mouth daily.    [provider]  lidocaine (LIDODERM) 5 % Place 1 patch onto the skin daily. Remove & Discard patch within 12 hours or as directed by MD 10/19/17   Elodia Florence., MD  magnesium chloride (SLOW-MAG) 64 MG TBEC SR tablet Take 1 tablet by mouth 2 (two) times daily.     [provider]  Multiple Vitamins-Minerals (MULTIVITAMIN ADULTS PO) Take by mouth daily.    [provider]  Nutritional Supplements (COLON FORMULA PO) Take 1 capsule by mouth daily.     [provider]  omeprazole (PRILOSEC) 40 MG capsule Take 40 mg by mouth daily.     [provider]    Physical Exam: Vitals:   03/15/18 1346 03/15/18 1347 03/15/18 1530 03/15/18 1615  BP:   (!) 159/63 (!) 151/80  Pulse: 98     Resp:   (!) 22 (!) 21  Temp:  98.3 F (36.8 C)    SpO2: 99%  100%     Constitutional: NAD, calm, comfortable, hard of hearing, frail Vitals:   03/15/18 1346 03/15/18 1347 03/15/18 1530 03/15/18 1615  BP:   (!) 159/63 (!) 151/80  Pulse: 98     Resp:   (!) 22 (!) 21  Temp:  98.3 F (36.8 C)     SpO2: 99%  100%    Eyes: lids and conjunctivae normal ENMT: Mucous membranes are moist.  Neck: normal, supple Respiratory: clear to auscultation bilaterally. Normal respiratory effort. No accessory muscle use.  Cardiovascular: Regular rate and rhythm, no murmurs. No extremity edema. Abdomen: no tenderness, no distention. Bowel sounds positive.  Musculoskeletal:  No joint deformity upper and lower extremities.   Skin: no rashes, lesions, ulcers.  Psychiatric: Normal judgment and insight. Alert and oriented x 3. Normal mood.   Labs on Admission: I have personally reviewed following labs and  imaging studies  CBC: Recent Labs  Lab 03/15/18 1349  WBC 16.3*  HGB 11.6*  HCT 36.1  MCV 94.3  PLT 979   Basic Metabolic Panel: Recent Labs  Lab 03/15/18 1349  NA 135  K 4.4  CL 103  CO2 23  GLUCOSE 121*  BUN 27*  CREATININE 1.22*  CALCIUM 9.6   GFR: Estimated Creatinine Clearance: 28.5 mL/min (A) (by C-G formula based on SCr of 1.22 mg/dL (H)). Liver Function Tests: No results for input(s): AST, ALT, ALKPHOS, BILITOT, PROT, ALBUMIN in the last 168 hours. No results for input(s): LIPASE, AMYLASE in the last 168 hours. No results for input(s): AMMONIA in the last 168 hours. Coagulation Profile: No results for input(s): INR, PROTIME in the last 168 hours. Cardiac Enzymes: No results for input(s): CKTOTAL, CKMB, CKMBINDEX, TROPONINI in the last 168 hours. BNP (last 3 results) No results for input(s): PROBNP in the last 8760 hours. HbA1C: No results for input(s): HGBA1C in the last 72 hours. CBG: No results for input(s): GLUCAP in the last 168 hours. Lipid Profile: No results for input(s): CHOL, HDL, LDLCALC, TRIG, CHOLHDL, LDLDIRECT in the last 72 hours. Thyroid Function Tests: No results for input(s): TSH, T4TOTAL, FREET4, T3FREE, THYROIDAB in the last 72 hours. Anemia Panel: No results for input(s): VITAMINB12, FOLATE, FERRITIN, TIBC, IRON, RETICCTPCT in the last 72  hours. Urine analysis:    Component Value Date/Time   COLORURINE AMBER (A) 03/15/2018 1756   APPEARANCEUR CLOUDY (A) 03/15/2018 1756   LABSPEC 1.010 03/15/2018 1756   PHURINE 8.0 03/15/2018 1756   GLUCOSEU NEGATIVE 03/15/2018 1756   HGBUR SMALL (A) 03/15/2018 1756   BILIRUBINUR NEGATIVE 03/15/2018 1756   BILIRUBINUR neg 04/08/2012 1552   KETONESUR NEGATIVE 03/15/2018 1756   PROTEINUR NEGATIVE 03/15/2018 1756   UROBILINOGEN negative 04/08/2012 1552   UROBILINOGEN 0.2 10/06/2010 2006   NITRITE NEGATIVE 03/15/2018 1756   LEUKOCYTESUR MODERATE (A) 03/15/2018 1756    Radiological Exams on Admission: Dg Chest 2 View  Result Date: 03/15/2018 CLINICAL DATA:  Per EMS- pt was found on the floor by family this morning after a syncopal fall, states she feel and crawled about 15 feet. She has burn from carpet the left wrist and the right knee. Pt reports feeling weak. EKG normal. EXAM: CHEST - 2 VIEW COMPARISON:  10/19/2017 and previous FINDINGS: Prominent coarse pulmonary interstitial markings as before. Some subsegmental atelectasis or infiltrate laterally at the left lung base, obscuring the diaphragmatic leaflet. Heart size and mediastinal contours are within normal limits. Aortic Atherosclerosis (ICD10-170.0). Hiatal hernia. No effusion.  Surgical clips at the thoracic inlet on the right. Thoracic kyphosis without acute finding IMPRESSION: 1. Subsegmental atelectasis or infiltrate laterally at the left lung base, new since prior study. Electronically Signed   By: Lucrezia Europe M.D.   On: 03/15/2018 17:18   Ct Head Wo Contrast  Result Date: 03/15/2018 CLINICAL DATA:  84 year old female with acute head and neck pain following syncope and fall today. EXAM: CT HEAD WITHOUT CONTRAST CT CERVICAL SPINE WITHOUT CONTRAST TECHNIQUE: Multidetector CT imaging of the head and cervical spine was performed following the standard protocol without intravenous contrast. Multiplanar CT image reconstructions of the  cervical spine were also generated. COMPARISON:  09/01/2015 head CT and prior studies FINDINGS: CT HEAD FINDINGS Brain: No evidence of acute infarction, hemorrhage, hydrocephalus, extra-axial collection or mass lesion/mass effect. Atrophy and chronic small-vessel white matter ischemic changes again noted. Vascular: Atherosclerotic calcifications noted. Skull: Normal. Negative for fracture or focal lesion. Sinuses/Orbits:  No acute finding. Other: None. CT CERVICAL SPINE FINDINGS Alignment: Normal. Skull base and vertebrae: No acute fracture. No primary bone lesion or focal pathologic process. Soft tissues and spinal canal: No prevertebral fluid or swelling. No visible canal hematoma. Disc levels: Mild to moderate degenerative disc disease and spondylosis at C4-5 and C6-7 noted. Upper chest: No acute abnormality Other: None IMPRESSION: 1. No evidence of acute intracranial abnormality. Atrophy and chronic small-vessel white matter ischemic changes. 2. No static evidence of acute injury to the cervical spine. Electronically Signed   By: Margarette Canada M.D.   On: 03/15/2018 17:56   Ct Cervical Spine Wo Contrast  Result Date: 03/15/2018 CLINICAL DATA:  84 year old female with acute head and neck pain following syncope and fall today. EXAM: CT HEAD WITHOUT CONTRAST CT CERVICAL SPINE WITHOUT CONTRAST TECHNIQUE: Multidetector CT imaging of the head and cervical spine was performed following the standard protocol without intravenous contrast. Multiplanar CT image reconstructions of the cervical spine were also generated. COMPARISON:  09/01/2015 head CT and prior studies FINDINGS: CT HEAD FINDINGS Brain: No evidence of acute infarction, hemorrhage, hydrocephalus, extra-axial collection or mass lesion/mass effect. Atrophy and chronic small-vessel white matter ischemic changes again noted. Vascular: Atherosclerotic calcifications noted. Skull: Normal. Negative for fracture or focal lesion. Sinuses/Orbits: No acute finding.  Other: None. CT CERVICAL SPINE FINDINGS Alignment: Normal. Skull base and vertebrae: No acute fracture. No primary bone lesion or focal pathologic process. Soft tissues and spinal canal: No prevertebral fluid or swelling. No visible canal hematoma. Disc levels: Mild to moderate degenerative disc disease and spondylosis at C4-5 and C6-7 noted. Upper chest: No acute abnormality Other: None IMPRESSION: 1. No evidence of acute intracranial abnormality. Atrophy and chronic small-vessel white matter ischemic changes. 2. No static evidence of acute injury to the cervical spine. Electronically Signed   By: Margarette Canada M.D.   On: 03/15/2018 17:56    EKG: Independently reviewed.  Sinus rhythm at 87 bpm.  Assessment/Plan Principal Problem:   Syncope Active Problems:   CLL (chronic lymphocytic leukemia) ([facility])   Hypertension   Hyperlipidemia   GERD (gastroesophageal reflux disease)   Hypothyroidism   UTI (urinary tract infection)   Community acquired pneumonia    1. Syncopal episode in the setting of frailty.  Further evaluation with 2D echocardiogram with prior performed on 08/2015 with EF 65 to 70% and grade 1 diastolic dysfunction noted at that time.  2D ultrasound of the carotids.  Maintain on fall precautions with PT evaluation.  Maintain on gentle IV fluid and check orthostatics in a.m.  She is apparently noted to have some orthostasis previously.  May require some rehab/SNF placement given the fact that she lives alone.  Check CK level. 2. UTI.  Order urine cultures and start Rocephin empirically. 3. Left upper lobe community-acquired pneumonia.  Patient does have a complaint of cough with minimal sputum production noted in the last few days.  Continue on Rocephin. 4. Hypertension/hyperlipidemia.  Does not appear to be on any home medications for this.  Continue to monitor closely. 5. Hypothyroidism.  Continue levothyroxine and check TSH. 6. Iron deficiency anemia.  Appears to be stable with no overt  bleeding identified.  Continue on ferrous sulfate. 7. GERD.  PPI.   DVT prophylaxis: Heparin Code Status: DNR Family Communication: Son, Yvone Neu, at bedside Disposition Plan: Treatment of pneumonia/UTI along with syncope evaluation.  PT evaluation for possible rehab placement. Consults called: None Admission status: Inpatient, telemetry   [facility] Pager  [phone]  If 7PM-7AM, please contact night-coverage [email or web address] Password [facility]  03/15/2018, 7:27 PM

## 2018-03-15 NOTE — ED Notes (Signed)
Purewick applied.

## 2018-03-15 NOTE — ED Notes (Signed)
Patient transported to CT 

## 2018-03-15 NOTE — ED Notes (Signed)
Patient hooked up to purewick by NT, patient aware of need for urine sample.  States she has not had the urge

## 2018-03-15 NOTE — ED Triage Notes (Signed)
Per EMS- pt was found on the floor by family this morning after a syncopal fall, states she feel and crawled about 15 feet. She has burn from carpet the left wrist and the right knee. Pt reports feeling weak. EKG normal.

## 2018-03-16 ENCOUNTER — Other Ambulatory Visit: Payer: Self-pay

## 2018-03-16 ENCOUNTER — Inpatient Hospital Stay (HOSPITAL_COMMUNITY): Payer: Medicare Other

## 2018-03-16 DIAGNOSIS — R55 Syncope and collapse: Principal | ICD-10-CM

## 2018-03-16 DIAGNOSIS — J181 Lobar pneumonia, unspecified organism: Secondary | ICD-10-CM

## 2018-03-16 DIAGNOSIS — C9190 Lymphoid leukemia, unspecified not having achieved remission: Secondary | ICD-10-CM

## 2018-03-16 DIAGNOSIS — N390 Urinary tract infection, site not specified: Secondary | ICD-10-CM

## 2018-03-16 DIAGNOSIS — I5030 Unspecified diastolic (congestive) heart failure: Secondary | ICD-10-CM

## 2018-03-16 LAB — BASIC METABOLIC PANEL
ANION GAP: 10 (ref 5–15)
BUN: 21 mg/dL (ref 8–23)
CO2: 19 mmol/L — ABNORMAL LOW (ref 22–32)
CREATININE: 1.06 mg/dL — AB (ref 0.44–1.00)
Calcium: 9 mg/dL (ref 8.9–10.3)
Chloride: 108 mmol/L (ref 98–111)
GFR, EST AFRICAN AMERICAN: 52 mL/min — AB (ref >60)
GFR, EST NON AFRICAN AMERICAN: 45 mL/min — AB (ref >60)
Glucose, Bld: 88 mg/dL (ref 70–99)
Potassium: 4 mmol/L (ref 3.5–5.1)
Sodium: 137 mmol/L (ref 135–145)

## 2018-03-16 LAB — CBC
HCT: 34.4 % — ABNORMAL LOW (ref 36.0–46.0)
HEMOGLOBIN: 11.3 g/dL — AB (ref 12.0–15.0)
MCH: 30.6 pg (ref 26.0–34.0)
MCHC: 32.8 g/dL (ref 30.0–36.0)
MCV: 93.2 fL (ref 78.0–100.0)
PLATELETS: 206 10*3/uL (ref 150–400)
RBC: 3.69 MIL/uL — AB (ref 3.87–5.11)
RDW: 13.3 % (ref 11.5–15.5)
WBC: 12.7 10*3/uL — ABNORMAL HIGH (ref 4.0–10.5)

## 2018-03-16 LAB — STREP PNEUMONIAE URINARY ANTIGEN: STREP PNEUMO URINARY ANTIGEN: NEGATIVE

## 2018-03-16 LAB — ECHOCARDIOGRAM COMPLETE

## 2018-03-16 MED ORDER — FLUTICASONE PROPIONATE 50 MCG/ACT NA SUSP
1.0000 | Freq: Every day | NASAL | Status: DC
  Administered 2018-03-16 – 2018-03-17 (×2): 1 via NASAL
  Filled 2018-03-16: qty 16

## 2018-03-16 NOTE — Progress Notes (Signed)
New Admission Note:  Arrival Method: By bed from ED around 2130 Mental Orientation: Alert and oriented Telemetry: Box 13, CCMD notified Assessment: Completed Skin: Completed, refer to flowsheets IV: Right A.C. Pain: Denies Tubes: None Safety Measures: Safety Fall Prevention Plan was given, discussed and signed. Admission: Completed [facility] Orientation: Patient has been orientated to the room, unit and the staff. Family: Daughter at bedside  Orders have been reviewed and implemented. Will continue to monitor the patient. Call light has been placed within reach and bed alarm has been activated.   Perry Mount, RN  Phone Number: [phone]

## 2018-03-16 NOTE — Progress Notes (Signed)
Echocardiogram 2D Echocardiogram has been performed.  [name] 03/16/2018, 11:30 AM

## 2018-03-16 NOTE — Evaluation (Signed)
Physical Therapy Evaluation Patient Details Name: Donna Burch MRN: [id number] DOB: 1926/09/09 Today's Date: 03/16/2018   History of Present Illness  Donna Burch is a 84 y.o. female with medical history significant for gallstone pancreatitis, hypertension, CKD stage III, hypothyroidism, GERD, iron deficiency anemia, CLL, and prior noted orthostatic hypotension who was brought to the ED via EMS after she was noted to have a syncopal episode morning of 9/22. Urinalysis consistent with UTI, Left upper lobe community-acquired pneumonia.   Clinical Impression   Pt admitted with above diagnosis. Pt currently with functional limitations due to the deficits listed below (see PT Problem List). Independent prior to admission, can drive, but chooses not to, and Donna Burch reports more difficulty rising to stand from sitting these days; Presents with generalized weakness and arthritis pain in knees; Reports her sons can help her as needed and she very much wants to dc home -- rec HHPT follow-up; Pt will benefit from skilled PT to increase their independence and safety with mobility to allow discharge to the venue listed below.       Follow Up Recommendations Home health PT    Equipment Recommendations  Rolling walker with 5" wheels    Recommendations for Other Services       Precautions / Restrictions Precautions Precautions: Fall Restrictions Weight Bearing Restrictions: No      Mobility  Bed Mobility Overal bed mobility: Needs Assistance Bed Mobility: Supine to Sit     Supine to sit: Min assist     General bed mobility comments: MIn handheld assist to pull to sit  Transfers Overall transfer level: Needs assistance Equipment used: 2 person hand held assist Transfers: Sit to/from Stand Sit to Stand: Mod assist         General transfer comment: Light mod assist of 2 to power up to stand; audible crepitus with rise to stand and descent to sit; Dependent on UE  support  Ambulation/Gait Ambulation/Gait assistance: Min guard Gait Distance (Feet): 25 Feet(to and from bathroom) Assistive device: 1 person hand held assist Gait Pattern/deviations: Step-through pattern;Decreased step length - right;Decreased step length - left;Trunk flexed     General Gait Details: Noted tendency to reach out and furniture walk for UE support  Stairs            Wheelchair Mobility    Modified Rankin (Stroke Patients Only)       Balance Overall balance assessment: Needs assistance           Standing balance-Leahy Scale: Fair                               Pertinent Vitals/Pain Pain Assessment: Faces Faces Pain Scale: Hurts a little bit Pain Location: bilateral knees; arthritic pain; audible crepitus Pain Descriptors / Indicators: Grimacing Pain Intervention(s): Monitored during session    Home Living Family/patient expects to be discharged to:: Private residence Living Arrangements: Alone Available Help at Discharge: Family;Available PRN/intermittently Type of Home: House Home Access: Stairs to enter Entrance Stairs-Rails: Can reach both Entrance Stairs-Number of Steps: 3 Home Layout: One level Home Equipment: Cane - single point(pt tells me)      Prior Function Level of Independence: Independent         Comments: reports she has recently had difficulty rising from low seated surfaces     Hand Dominance        Extremity/Trunk Assessment   Upper Extremity Assessment Upper Extremity Assessment: Overall  WFL for tasks assessed    Lower Extremity Assessment Lower Extremity Assessment: Generalized weakness(Difficulty rising from low surface)       Communication   Communication: No difficulties;HOH(slight HOH)  Cognition Arousal/Alertness: Awake/alert Behavior During Therapy: WFL for tasks assessed/performed Overall Cognitive Status: Within Functional Limits for tasks assessed                                         General Comments General comments (skin integrity, edema, etc.):   03/16/18 0910  Vital Signs  Patient Position (if appropriate) Orthostatic Vitals  Orthostatic Lying   BP- Lying 117/53  Pulse- Lying 73  Orthostatic Sitting  BP- Sitting 140/85  Pulse- Sitting 70  Orthostatic Standing at 0 minutes  BP- Standing at 0 minutes (!) 163/96  Pulse- Standing at 0 minutes 54       Exercises     Assessment/Plan    PT Assessment Patient needs continued PT services  PT Problem List Decreased strength;Decreased range of motion;Decreased activity tolerance;Decreased balance;Decreased mobility;Decreased knowledge of use of DME;Decreased knowledge of precautions;Pain       PT Treatment Interventions DME instruction;Gait training;Stair training;Functional mobility training;Therapeutic activities;Therapeutic exercise;Balance training;Patient/family education    PT Goals (Current goals can be found in the Care Plan section)  Acute Rehab PT Goals Patient Stated Goal: Hopes to go home today PT Goal Formulation: With patient Time For Goal Achievement: 03/30/18 Potential to Achieve Goals: Good    Frequency Min 3X/week   Barriers to discharge        Co-evaluation               AM-PAC PT "6 Clicks" Daily Activity  Outcome Measure Difficulty turning over in bed (including adjusting bedclothes, sheets and blankets)?: A Little Difficulty moving from lying on back to sitting on the side of the bed? : A Little Difficulty sitting down on and standing up from a chair with arms (e.g., wheelchair, bedside commode, etc,.)?: A Lot Help needed moving to and from a bed to chair (including a wheelchair)?: A Little Help needed walking in hospital room?: A Little Help needed climbing 3-5 steps with a railing? : A Little 6 Click Score: 17    End of Session Equipment Utilized During Treatment: Gait belt Activity Tolerance: Patient tolerated treatment well Patient left: in  chair;with call bell/phone within reach;with chair alarm set;with family/visitor present Nurse Communication: Mobility status PT Visit Diagnosis: Unsteadiness on feet (R26.81);Other abnormalities of gait and mobility (R26.89);Muscle weakness (generalized) (M62.81)    Time: 1610-9604 PT Time Calculation (min) (ACUTE ONLY): 29 min   Charges:   PT Evaluation $PT Eval Low Complexity: 1 Low PT Treatments $Gait Training: 8-22 mins        Roney Marion, PT  Acute Rehabilitation Services Pager [phone] Office [place] 03/16/2018, 11:55 AM

## 2018-03-16 NOTE — Care Management Note (Signed)
Case Management Note  Patient Details  Name: Donna Burch MRN: [id number] Date of Birth: 1927-04-08  Subjective/Objective:                    Action/Plan:  Both sons at bedside and in agreement for discharge to home with home health services. Referral given to [facility] with [facility]. Referral for walker given to [facility] with [facility]. Expected Discharge Date:                  Expected Discharge Plan:  [place]  In-House Referral:     Discharge planning Services  CM Consult  Post Acute Care Choice:  Durable Medical Equipment, Home Health Choice offered to:  Patient, Adult Children  DME Arranged:  Walker rolling DME Agency:  [place] Arranged:  RN, PT, Nurse's Aide, Social Work [employer] Agency:  [place]  Status of Service:  Completed, signed off  If discussed at [employer], dates discussed:    Additional Comments:  Marilu Favre, RN 03/16/2018, 12:54 PM

## 2018-03-16 NOTE — Progress Notes (Addendum)
PROGRESS NOTE  Donna Burch YOV:[id number] DOB: 06-23-1927 DOA: 03/15/2018 PCP: Harlan Stains, MD  HPI/Recap of past 24 hours:  Feeling weak, some mild nonproductive cough, mild left rib/flank pain No fever Son at bedside  Assessment/Plan: Principal Problem:   Syncope Active Problems:   CLL (chronic lymphocytic leukemia) (HCC)   Hypertension   Hyperlipidemia   GERD (gastroesophageal reflux disease)   Hypothyroidism   UTI (urinary tract infection)   Community acquired pneumonia   Syncope and collapse  Fall/Syncope -Patient reports "pt was found on the floor by family this morning after a syncopal fall, states she fell and crawled about 15 feet."  -she reports was cooking in the kitchen, feeling hot prior to syncope episode, she reports LOC a few second, she report has syncope in 2017 when she had to wear a heart monitor with no arrythmia detected at the time -keep on tele, echo/cartotis US done, result pending -orthostatic vital sign unremarkable, but obtained after hydration -continue hydration, she is dry on exam  Lobar pneumonia? cxr "1. Subsegmental atelectasis or infiltrate laterally at the left lung base, new since prior study." She does has a nonproductive cough, and reports left sided pleuritic pain  wbc 16.3 on presentation She reports has chronic mild cough, also has nasal congestion use flonase sometimes which helped Will check urine strepneumo antigen Continue rocephin for now Start flonase Repeat cbc in am  UTI? Urine culture pending collection  HTN  CKDIII Cr close to baseline  Hypothyroidism  CLL,  not on treatment, followed by Dr oncology Irene Limbo  FTT Report feeling tired all the time  Code Status: DNR  Family Communication: patient and son  Disposition Plan: home with home health tomorrow, she declined snf placement She lives by herself, son lives three house  down   Consultants:  none  Procedures:  none  Antibiotics:  rocephin   Objective: BP (!) 113/55 (BP Location: Right Arm)   Pulse 68   Temp 98.6 F (37 C) (Oral)   Resp 16   SpO2 96%   Intake/Output Summary (Last 24 hours) at 03/16/2018 0847 Last data filed at 03/16/2018 7412 Gross per 24 hour  Intake 340.15 ml  Output 1 ml  Net 339.15 ml   There were no vitals filed for this visit.  Exam: Patient is examined daily including today on 03/16/2018, exams remain the same as of yesterday except that has changed    General:  Frail, chronically ill, but NAD  Cardiovascular: RRR, with ectopic beats   Respiratory: CTABL  Abdomen: Soft/ND/NT, positive BS  Musculoskeletal: No Edema  Neuro: alert, oriented   Data Reviewed: Basic Metabolic Panel: Recent Labs  Lab 03/15/18 1349 03/15/18 2000 03/16/18 0538  NA 135  --  137  K 4.4  --  4.0  CL 103  --  108  CO2 23  --  19*  GLUCOSE 121*  --  88  BUN 27*  --  21  CREATININE 1.22* 1.10* 1.06*  CALCIUM 9.6  --  9.0   Liver Function Tests: No results for input(s): AST, ALT, ALKPHOS, BILITOT, PROT, ALBUMIN in the last 168 hours. No results for input(s): LIPASE, AMYLASE in the last 168 hours. No results for input(s): AMMONIA in the last 168 hours. CBC: Recent Labs  Lab 03/15/18 1349 03/15/18 2000 03/16/18 0538  WBC 16.3* 14.8* 12.7*  HGB 11.6* 11.2* 11.3*  HCT 36.1 34.7* 34.4*  MCV 94.3 94.0 93.2  PLT 216 202 206   Cardiac Enzymes:  Recent Labs  Lab 03/15/18 2000  CKTOTAL 216   BNP (last 3 results) No results for input(s): BNP in the last 8760 hours.  ProBNP (last 3 results) No results for input(s): PROBNP in the last 8760 hours.  CBG: No results for input(s): GLUCAP in the last 168 hours.  No results found for this or any previous visit (from the past 240 hour(s)).   Studies: Dg Chest 2 View  Result Date: 03/15/2018 CLINICAL DATA:  Per EMS- pt was found on the floor by family this morning  after a syncopal fall, states she feel and crawled about 15 feet. She has burn from carpet the left wrist and the right knee. Pt reports feeling weak. EKG normal. EXAM: CHEST - 2 VIEW COMPARISON:  10/19/2017 and previous FINDINGS: Prominent coarse pulmonary interstitial markings as before. Some subsegmental atelectasis or infiltrate laterally at the left lung base, obscuring the diaphragmatic leaflet. Heart size and mediastinal contours are within normal limits. Aortic Atherosclerosis (ICD10-170.0). Hiatal hernia. No effusion.  Surgical clips at the thoracic inlet on the right. Thoracic kyphosis without acute finding IMPRESSION: 1. Subsegmental atelectasis or infiltrate laterally at the left lung base, new since prior study. Electronically Signed   By: Lucrezia Europe M.D.   On: 03/15/2018 17:18   Ct Head Wo Contrast  Result Date: 03/15/2018 CLINICAL DATA:  84 year old female with acute head and neck pain following syncope and fall today. EXAM: CT HEAD WITHOUT CONTRAST CT CERVICAL SPINE WITHOUT CONTRAST TECHNIQUE: Multidetector CT imaging of the head and cervical spine was performed following the standard protocol without intravenous contrast. Multiplanar CT image reconstructions of the cervical spine were also generated. COMPARISON:  09/01/2015 head CT and prior studies FINDINGS: CT HEAD FINDINGS Brain: No evidence of acute infarction, hemorrhage, hydrocephalus, extra-axial collection or mass lesion/mass effect. Atrophy and chronic small-vessel white matter ischemic changes again noted. Vascular: Atherosclerotic calcifications noted. Skull: Normal. Negative for fracture or focal lesion. Sinuses/Orbits: No acute finding. Other: None. CT CERVICAL SPINE FINDINGS Alignment: Normal. Skull base and vertebrae: No acute fracture. No primary bone lesion or focal pathologic process. Soft tissues and spinal canal: No prevertebral fluid or swelling. No visible canal hematoma. Disc levels: Mild to moderate degenerative disc  disease and spondylosis at C4-5 and C6-7 noted. Upper chest: No acute abnormality Other: None IMPRESSION: 1. No evidence of acute intracranial abnormality. Atrophy and chronic small-vessel white matter ischemic changes. 2. No static evidence of acute injury to the cervical spine. Electronically Signed   By: Margarette Canada M.D.   On: 03/15/2018 17:56   Ct Cervical Spine Wo Contrast  Result Date: 03/15/2018 CLINICAL DATA:  84 year old female with acute head and neck pain following syncope and fall today. EXAM: CT HEAD WITHOUT CONTRAST CT CERVICAL SPINE WITHOUT CONTRAST TECHNIQUE: Multidetector CT imaging of the head and cervical spine was performed following the standard protocol without intravenous contrast. Multiplanar CT image reconstructions of the cervical spine were also generated. COMPARISON:  09/01/2015 head CT and prior studies FINDINGS: CT HEAD FINDINGS Brain: No evidence of acute infarction, hemorrhage, hydrocephalus, extra-axial collection or mass lesion/mass effect. Atrophy and chronic small-vessel white matter ischemic changes again noted. Vascular: Atherosclerotic calcifications noted. Skull: Normal. Negative for fracture or focal lesion. Sinuses/Orbits: No acute finding. Other: None. CT CERVICAL SPINE FINDINGS Alignment: Normal. Skull base and vertebrae: No acute fracture. No primary bone lesion or focal pathologic process. Soft tissues and spinal canal: No prevertebral fluid or swelling. No visible canal hematoma. Disc levels: Mild to moderate degenerative disc  disease and spondylosis at C4-5 and C6-7 noted. Upper chest: No acute abnormality Other: None IMPRESSION: 1. No evidence of acute intracranial abnormality. Atrophy and chronic small-vessel white matter ischemic changes. 2. No static evidence of acute injury to the cervical spine. Electronically Signed   By: Margarette Canada M.D.   On: 03/15/2018 17:56    Scheduled Meds: . aspirin  81 mg Oral Daily  . cholecalciferol  2,000 Units Oral Daily  .  diclofenac sodium  2 g Topical QID  . ferrous sulfate  325 mg Oral Q breakfast  . heparin  5,000 Units Subcutaneous Q8H  . levothyroxine  75 mcg Oral QAC breakfast  . lidocaine  1 patch Transdermal Q24H  . multivitamin with minerals  1 tablet Oral Daily  . pantoprazole  40 mg Oral Daily    Continuous Infusions: . cefTRIAXone (ROCEPHIN)  IV 1 g (03/16/18 0008)     Time spent: 50mins I have personally reviewed and interpreted on  03/16/2018 daily labs, tele strips, imagings as discussed above under date review session and assessment and plans.  I reviewed all nursing notes, pharmacy notes,  vitals, pertinent old records  I have discussed plan of care as described above with RN , patient and family on 03/16/2018   Florencia Reasons MD, PhD  [facility] Pager [phone]. If 7PM-7AM, please contact night-coverage at [email or web address], password [email or web address] 03/16/2018, 8:47 AM  LOS: 1 day

## 2018-03-16 NOTE — Progress Notes (Signed)
*  Preliminary Results* Carotid artery duplex has been completed. Bilateral internal carotid arteries are 1-39% stenosis. Vertebral arteries are patent with antegrade flow.  03/16/2018 11:28 AM  Donna Burch Dawna Part

## 2018-03-17 ENCOUNTER — Telehealth (INDEPENDENT_AMBULATORY_CARE_PROVIDER_SITE_OTHER): Payer: Self-pay

## 2018-03-17 LAB — BASIC METABOLIC PANEL
Anion gap: 8 (ref 5–15)
BUN: 17 mg/dL (ref 8–23)
CO2: 24 mmol/L (ref 22–32)
Calcium: 9.3 mg/dL (ref 8.9–10.3)
Chloride: 106 mmol/L (ref 98–111)
Creatinine, Ser: 1.09 mg/dL — ABNORMAL HIGH (ref 0.44–1.00)
GFR calc Af Amer: 51 mL/min — ABNORMAL LOW (ref >60)
GFR, EST NON AFRICAN AMERICAN: 44 mL/min — AB (ref >60)
GLUCOSE: 91 mg/dL (ref 70–99)
Potassium: 4.3 mmol/L (ref 3.5–5.1)
Sodium: 138 mmol/L (ref 135–145)

## 2018-03-17 LAB — MAGNESIUM: Magnesium: 1.5 mg/dL — ABNORMAL LOW (ref 1.7–2.4)

## 2018-03-17 LAB — CBC
HCT: 32.8 % — ABNORMAL LOW (ref 36.0–46.0)
Hemoglobin: 10.6 g/dL — ABNORMAL LOW (ref 12.0–15.0)
MCH: 30.4 pg (ref 26.0–34.0)
MCHC: 32.3 g/dL (ref 30.0–36.0)
MCV: 94 fL (ref 78.0–100.0)
PLATELETS: 210 10*3/uL (ref 150–400)
RBC: 3.49 MIL/uL — AB (ref 3.87–5.11)
RDW: 13.2 % (ref 11.5–15.5)
WBC: 10.6 10*3/uL — ABNORMAL HIGH (ref 4.0–10.5)

## 2018-03-17 MED ORDER — GUAIFENESIN ER 600 MG PO TB12: 600.0000 mg | ORAL_TABLET | Freq: Two times a day (BID) | ORAL | 0 refills | Status: AC

## 2018-03-17 MED ORDER — GUAIFENESIN ER 600 MG PO TB12
600.0000 mg | ORAL_TABLET | Freq: Two times a day (BID) | ORAL | Status: DC
  Administered 2018-03-17: 600 mg via ORAL
  Filled 2018-03-17: qty 1

## 2018-03-17 MED ORDER — VITAMIN C 250 MG PO TABS: 250.0000 mg | ORAL_TABLET | Freq: Every day | ORAL | 0 refills | Status: DC

## 2018-03-17 MED ORDER — AMOXICILLIN-POT CLAVULANATE 500-125 MG PO TABS: 1.0000 | ORAL_TABLET | Freq: Two times a day (BID) | ORAL | 0 refills | Status: AC

## 2018-03-17 MED ORDER — VITAMIN D 1000 UNITS PO TABS: 2000.0000 [IU] | ORAL_TABLET | Freq: Every day | ORAL | 0 refills | Status: AC

## 2018-03-17 MED ORDER — LEVOTHYROXINE SODIUM 75 MCG PO TABS: 37.5000 ug | ORAL_TABLET | ORAL | Status: DC

## 2018-03-17 MED ORDER — LEVOTHYROXINE SODIUM 75 MCG PO TABS
75.0000 ug | ORAL_TABLET | ORAL | Status: DC
  Administered 2018-03-17: 75 ug via ORAL
  Filled 2018-03-17: qty 1

## 2018-03-17 MED ORDER — SODIUM CHLORIDE 0.9 % IV SOLN
INTRAVENOUS | Status: DC | PRN
  Administered 2018-03-17: 500 mL via INTRAVENOUS

## 2018-03-17 MED ORDER — MAGNESIUM GLUCONATE 30 MG PO TABS: 30.0000 mg | ORAL_TABLET | Freq: Every day | ORAL | 0 refills | Status: AC

## 2018-03-17 MED ORDER — LEVOTHYROXINE SODIUM 75 MCG PO TABS: 75.0000 ug | ORAL_TABLET | ORAL | Status: DC

## 2018-03-17 MED ORDER — FERROUS SULFATE 325 (65 FE) MG PO TBEC: 325.0000 mg | DELAYED_RELEASE_TABLET | Freq: Every day | ORAL | 0 refills | Status: AC

## 2018-03-17 MED ORDER — FLUTICASONE PROPIONATE 50 MCG/ACT NA SUSP: 1.0000 | Freq: Every day | NASAL | 0 refills | Status: AC

## 2018-03-17 MED ORDER — MAGNESIUM SULFATE 2 GM/50ML IV SOLN
2.0000 g | Freq: Once | INTRAVENOUS | Status: AC
  Administered 2018-03-17: 2 g via INTRAVENOUS
  Filled 2018-03-17: qty 50

## 2018-03-17 NOTE — Progress Notes (Signed)
Physical Therapy Treatment Patient Details Name: Donna Burch MRN: [id number] DOB: 1926-12-02 Today's Date: 03/17/2018    History of Present Illness Donna Burch is a 84 y.o. female with medical history significant for gallstone pancreatitis, hypertension, CKD stage III, hypothyroidism, GERD, iron deficiency anemia, CLL, and prior noted orthostatic hypotension who was brought to the ED via EMS after she was noted to have a syncopal episode morning of 9/22. Urinalysis consistent with UTI, Left upper lobe community-acquired pneumonia.     PT Comments    Continuing work on functional mobility and activity tolerance;  Session focused on gait and stair training, as the plan is for her to dc home today; Walks well with RW with cues for RW proximity and posture; Audible arthritic crepitus with descending steps; OK for dc home from PT standpoint   Follow Up Recommendations  Home health PT     Equipment Recommendations  Rolling walker with 5" wheels(delivered to room)    Recommendations for Other Services       Precautions / Restrictions Precautions Precautions: Fall    Mobility  Bed Mobility Overal bed mobility: Needs Assistance Bed Mobility: Supine to Sit     Supine to sit: Supervision     General bed mobility comments: Good initiation, inefficient movement to get to EOB, and inefficient reciprocal scoot to EOB, but not requiring physical assistance  Transfers Overall transfer level: Needs assistance Equipment used: Rolling walker (2 wheeled) Transfers: Sit to/from Stand Sit to Stand: Min guard         General transfer comment: Cues for hand placement and safety, as she initially reached to pull up on RW; slow rise with audible crepitus, no physical assist needed  Ambulation/Gait Ambulation/Gait assistance: Supervision;Min guard Gait Distance (Feet): 250 Feet Assistive device: None;Rolling walker (2 wheeled) Gait Pattern/deviations: Step-through pattern;Trunk  flexed     General Gait Details: Cues for posture; initiated amb with RW for education, cues for RW proximity; ended walk without assistive device with minguard assist, no frank losses of balance; noted incr upper trunk flexion without RW and incr step width   Stairs Stairs: Yes Stairs assistance: Min guard Stair Management: Two rails;Step to pattern;Forwards Number of Stairs: 6 General stair comments: Slow moving, but managing well   Wheelchair Mobility    Modified Rankin (Stroke Patients Only)       Balance                                            Cognition Arousal/Alertness: Awake/alert Behavior During Therapy: WFL for tasks assessed/performed Overall Cognitive Status: Within Functional Limits for tasks assessed                                        Exercises      General Comments        Pertinent Vitals/Pain Pain Assessment: Faces Faces Pain Scale: Hurts a little bit Pain Location: bilateral knees; arthritic pain; audible crepitus with rise from low surface Pain Descriptors / Indicators: Grimacing Pain Intervention(s): Monitored during session    Home Living                      Prior Function            PT Goals (current goals can  now be found in the care plan section) Acute Rehab PT Goals Patient Stated Goal: Hopes to go home today PT Goal Formulation: With patient Time For Goal Achievement: 03/30/18 Potential to Achieve Goals: Good Progress towards PT goals: Progressing toward goals    Frequency    Min 3X/week      PT Plan Current plan remains appropriate    Co-evaluation              AM-PAC PT "6 Clicks" Daily Activity  Outcome Measure  Difficulty turning over in bed (including adjusting bedclothes, sheets and blankets)?: A Little Difficulty moving from lying on back to sitting on the side of the bed? : A Little Difficulty sitting down on and standing up from a chair with arms  (e.g., wheelchair, bedside commode, etc,.)?: A Little Help needed moving to and from a bed to chair (including a wheelchair)?: None Help needed walking in hospital room?: None Help needed climbing 3-5 steps with a railing? : A Little 6 Click Score: 20    End of Session Equipment Utilized During Treatment: Gait belt Activity Tolerance: Patient tolerated treatment well Patient left: in chair;with call bell/phone within reach;with nursing/sitter in room Nurse Communication: Mobility status PT Visit Diagnosis: Unsteadiness on feet (R26.81);Other abnormalities of gait and mobility (R26.89);Muscle weakness (generalized) (M62.81)     Time: 3668-1594 PT Time Calculation (min) (ACUTE ONLY): 22 min  Charges:  $Gait Training: 8-22 mins                     Roney Marion, [place] Pager [phone] Office [place] 03/17/2018, 9:47 AM

## 2018-03-17 NOTE — Discharge Summary (Signed)
Discharge Summary  JULIAH Burch ELF:[id number] DOB: 14-Mar-1927  PCP: Harlan Stains, MD  Admit date: 03/15/2018 Discharge date: 03/17/2018  Time spent: 74mins  Recommendations for Outpatient Follow-up:  1. F/u with PMD within a week  for hospital discharge follow up, repeat cbc/bmp at follow up. pcp to follow up on final urine culture result, pcp to repeat cxr in 3-4 weeks 2. F/u with hematology/oncology Dr Irene Limbo for h/o CLL 3. Home health arranged  Discharge Diagnoses:  Active Hospital Problems   Diagnosis Date Noted  . Syncope 09/01/2015  . UTI (urinary tract infection) 03/15/2018  . Community acquired pneumonia 03/15/2018  . Syncope and collapse 03/15/2018  . GERD (gastroesophageal reflux disease)   . Hyperlipidemia   . Hypertension   . Hypothyroidism   . CLL (chronic lymphocytic leukemia) ([place]) 07/14/2015    Resolved Hospital Problems  No resolved problems to display.    Discharge Condition: stable  Diet recommendation: heart healthy  There were no vitals filed for this visit.  History of present illness: (per admitting MD Dr Manuella Ghazi)   Patient coming from: Home  Chief Complaint: Syncopal episode  HPI: Donna Burch is a 84 y.o. female with medical history significant for gallstone pancreatitis, hypertension, CKD stage III, hypothyroidism, GERD, iron deficiency anemia, CLL, and prior noted orthostatic hypotension who was brought to the ED via EMS after she was noted to have a syncopal episode earlier this morning.  She apparently lives by herself and had to crawl across the floor to reach the phone to call 911 since her arthritis is so debilitating.  She denies any sensation of warmth, nausea, palpitations, dyspnea, chest pain or other symptomatology prior to the episode.  She states that much of this occurred suddenly.  Notably, her appetite has been diminished over the last several weeks and she has had little food intake or fluid intake according to the son at  the bedside.  She does, however take her medications as prescribed.   ED Course: Vital signs were noted to be stable and laboratory data demonstrated WBC count of 16,300.  Hemoglobin stable at 11.6.  Creatinine is stable at 1.22 near baseline of 1-1.3, consistent with stage III CKD.  CT of the head and neck with no acute findings noted.  2 view chest x-ray with what appears to be left lung infiltrate versus atelectasis.  Urinalysis consistent with UTI.  EKG with sinus rhythm at 87 bpm.   Hospital Course:  Principal Problem:   Syncope Active Problems:   CLL (chronic lymphocytic leukemia) ([facility])   Hypertension   Hyperlipidemia   GERD (gastroesophageal reflux disease)   Hypothyroidism   UTI (urinary tract infection)   Community acquired pneumonia   Syncope and collapse   Fall/Syncope -Patient reports "pt was found on the floor by family this morning after a syncopal fall, states she fell and crawled about 15 feet."  -she reports was cooking in the kitchen, feeling hot prior to syncope episode, she reports LOC a few second, she report has syncope in 2017 when she had to wear a heart monitor with no arrythmia detected at the time -ekg/ tele unremarkable, echo/cartotis US unremarkable, Echocardiogram lvef wnl, does has grade II diastolic dysfunctin -orthostatic vital sign unremarkable, but obtained after hydration -she is dry on exam, she received gentle hydration -encourage adequate oral intake -home with home health, follow up with pcp.  Lobar pneumonia? -cxr "1. Subsegmental atelectasis or infiltrate laterally at the left lung base, new since prior study." - reports  left sided pleuritic pain but reports it is chronic  - She reports has chronic mild cough, also has nasal congestion use flonase sometimes which helped -urine strepneumo antigen negative -she received rocephin in the hospital,  Wbc 16.3 (on presentation)-14.8-12.7-10.6(at discharge) -Start flonase -she is discharged on  augmentin, follow up with pcp, pcp to repeat cxr in 3-4 weeks.  UTI? Urine culture in process, she received rocephinx2 in the hospital, she is discharged on augmentin, she is to follow up with pcp. pcp to follow up final urine culture result (Per chart review , urine culture grew pan sensitive ecoli in 2013)  Hypomagnesemia: Mag 1.5 She received iv mag in the hospital, she is discharged on oral mag supplement  HTN stable on home meds losartan,  pcp to continue monitor blood pressure control, she does has grade II diastolic dysfunction on echocardigram, she need to have adequate long term blood pressure control.  CKDIII Cr close to baseline  Hypothyroidism Tsh 2.4, continue home med synthroid  CLL,  not on treatment, followed by Dr oncology Irene Limbo  FTT Report feeling tired all the time at baseline  Code Status: DNR  Family Communication: patient and son  Disposition Plan: home with home health, she declined snf placement She lives by herself, son lives three house down   Consultants:  none  Procedures:  none  Antibiotics:  rocephin   Discharge Exam: BP (!) 162/70 (BP Location: Right Arm)   Pulse 61   Temp 97.8 F (36.6 C) (Oral)   Resp 16   SpO2 97%   General: Frail, NAD Cardiovascular: RRR Respiratory: CTABL Abdomen: Soft/ND/NT, positive BS Musculoskeletal: No Edema Neuro: alert, oriented   Discharge Instructions You were cared for by a hospitalist during your hospital stay. If you have any questions about your discharge medications or the care you received while you were in the hospital after you are discharged, you can call the unit and asked to speak with the hospitalist on call if the hospitalist that took care of you is not available. Once you are discharged, your primary care physician will handle any further medical issues. Please note that NO REFILLS for any discharge medications will be authorized once you are discharged, as it is  imperative that you return to your primary care physician (or establish a relationship with a primary care physician if you do not have one) for your aftercare needs so that they can reassess your need for medications and monitor your lab values.  Discharge Instructions    Diet - low sodium heart healthy   Complete by:  As directed    Increase activity slowly   Complete by:  As directed      Allergies as of 03/17/2018      Reactions   Prednisone Other (See Comments)   "Jittery"   Reclast [zoledronic Acid] Other (See Comments)   Pt did not have a memory, from the day after she took that medication    Hydrochlorothiazide Other (See Comments)   Caused low sodium blood levels per [facility]   Lisinopril Cough      Medication List    STOP taking these medications   diclofenac sodium 1 % Gel Commonly known as:  VOLTAREN   lidocaine 5 % Commonly known as:  LIDODERM   SLOW-MAG 64 MG Tbec SR tablet Generic drug:  magnesium chloride     TAKE these medications   amoxicillin-clavulanate 500-125 MG tablet Commonly known as:  AUGMENTIN Take 1 tablet (500 mg total) by  mouth 2 (two) times daily for 5 days.   aspirin 81 MG chewable tablet Take 81 mg by mouth daily.   cholecalciferol 1000 units tablet Commonly known as:  VITAMIN D Take 2 tablets (2,000 Units total) by mouth daily.   ferrous sulfate 325 (65 FE) MG EC tablet Take 1 tablet (325 mg total) by mouth daily with breakfast.   fluticasone 50 MCG/ACT nasal spray Commonly known as:  FLONASE Place 1 spray into both nostrils daily.   guaiFENesin 600 MG 12 hr tablet Commonly known as:  MUCINEX Take 1 tablet (600 mg total) by mouth 2 (two) times daily.   levothyroxine 75 MCG tablet Commonly known as:  SYNTHROID, LEVOTHROID Take 37.5-75 mcg by mouth See admin instructions. Take 1 tablet (75 mcg) by mouth on Monday thru Friday mornings before breakfast and take 1/2 tablet (37.5 mcg) on Saturday and Sunday before  breakfast   losartan 100 MG tablet Commonly known as:  COZAAR Take 100 mg by mouth daily.   magnesium gluconate 30 MG tablet Commonly known as:  MAGONATE Take 1 tablet (30 mg total) by mouth daily.   Omega-3 500 MG Caps Take 500 mg by mouth daily.   omeprazole 40 MG capsule Commonly known as:  PRILOSEC Take 40 mg by mouth daily.   OVER THE COUNTER MEDICATION Apply 1 application topically 3 (three) times daily as needed (pain). [employer] Relief Muscle Cramp [place] Caps Take 1 capsule by mouth daily.   vitamin C 250 MG tablet Commonly known as:  ASCORBIC ACID Take 1 tablet (250 mg total) by mouth daily.   vitamin E 400 UNIT capsule Take 400 Units by mouth daily.            Durable Medical Equipment  (From admission, onward)         Start     Ordered   03/16/18 1248  For home use only DME Walker rolling  Once    Question:  Patient needs a walker to treat with the following condition  Answer:  Syncope   03/16/18 1247         Allergies  Allergen Reactions  . Prednisone Other (See Comments)    "Jittery"  . Reclast [Zoledronic Acid] Other (See Comments)    Pt did not have a memory, from the day after she took that medication   . Hydrochlorothiazide Other (See Comments)    Caused low sodium blood levels per [employer]  . Lisinopril Cough   Follow-up Information    Harlan Stains, MD Follow up in 1 week(s).   Specialty:  Family Medicine Why:  hospital discharge follow up, repeat cbc/bmp/mag at follow up. pcp to follow up on final urine culture result. pcp to repeat cxr two view in 3-4 weeks. Contact information: [place] [address] [place] Follow up.   Why:  rolling walker arranged- to be delivered to room prior to discharge Contact information: [address] [place] [address] [phone]        [facility]  Follow up.   Specialty:  [place] Why:  HHRN/PT/aide/social work arranged- they will call to set up home visits Contact information: [address] [place] [address] [phone]            The results of significant diagnostics from this hospitalization (including imaging, microbiology, ancillary and laboratory)  are listed below for reference.    Significant Diagnostic Studies: Dg Chest 2 View  Result Date: 03/15/2018 CLINICAL DATA:  Per EMS- pt was found on the floor by family this morning after a syncopal fall, states she feel and crawled about 15 feet. She has burn from carpet the left wrist and the right knee. Pt reports feeling weak. EKG normal. EXAM: CHEST - 2 VIEW COMPARISON:  10/19/2017 and previous FINDINGS: Prominent coarse pulmonary interstitial markings as before. Some subsegmental atelectasis or infiltrate laterally at the left lung base, obscuring the diaphragmatic leaflet. Heart size and mediastinal contours are within normal limits. Aortic Atherosclerosis (ICD10-170.0). Hiatal hernia. No effusion.  Surgical clips at the thoracic inlet on the right. Thoracic kyphosis without acute finding IMPRESSION: 1. Subsegmental atelectasis or infiltrate laterally at the left lung base, new since prior study. Electronically Signed   By: Lucrezia Europe M.D.   On: 03/15/2018 17:18   Ct Head Wo Contrast  Result Date: 03/15/2018 CLINICAL DATA:  84 year old female with acute head and neck pain following syncope and fall today. EXAM: CT HEAD WITHOUT CONTRAST CT CERVICAL SPINE WITHOUT CONTRAST TECHNIQUE: Multidetector CT imaging of the head and cervical spine was performed following the standard protocol without intravenous contrast. Multiplanar CT image reconstructions of the cervical spine were also generated. COMPARISON:  09/01/2015 head CT and prior studies FINDINGS: CT HEAD FINDINGS Brain: No evidence of acute infarction, hemorrhage, hydrocephalus, extra-axial collection or  mass lesion/mass effect. Atrophy and chronic small-vessel white matter ischemic changes again noted. Vascular: Atherosclerotic calcifications noted. Skull: Normal. Negative for fracture or focal lesion. Sinuses/Orbits: No acute finding. Other: None. CT CERVICAL SPINE FINDINGS Alignment: Normal. Skull base and vertebrae: No acute fracture. No primary bone lesion or focal pathologic process. Soft tissues and spinal canal: No prevertebral fluid or swelling. No visible canal hematoma. Disc levels: Mild to moderate degenerative disc disease and spondylosis at C4-5 and C6-7 noted. Upper chest: No acute abnormality Other: None IMPRESSION: 1. No evidence of acute intracranial abnormality. Atrophy and chronic small-vessel white matter ischemic changes. 2. No static evidence of acute injury to the cervical spine. Electronically Signed   By: Margarette Canada M.D.   On: 03/15/2018 17:56   Ct Cervical Spine Wo Contrast  Result Date: 03/15/2018 CLINICAL DATA:  84 year old female with acute head and neck pain following syncope and fall today. EXAM: CT HEAD WITHOUT CONTRAST CT CERVICAL SPINE WITHOUT CONTRAST TECHNIQUE: Multidetector CT imaging of the head and cervical spine was performed following the standard protocol without intravenous contrast. Multiplanar CT image reconstructions of the cervical spine were also generated. COMPARISON:  09/01/2015 head CT and prior studies FINDINGS: CT HEAD FINDINGS Brain: No evidence of acute infarction, hemorrhage, hydrocephalus, extra-axial collection or mass lesion/mass effect. Atrophy and chronic small-vessel white matter ischemic changes again noted. Vascular: Atherosclerotic calcifications noted. Skull: Normal. Negative for fracture or focal lesion. Sinuses/Orbits: No acute finding. Other: None. CT CERVICAL SPINE FINDINGS Alignment: Normal. Skull base and vertebrae: No acute fracture. No primary bone lesion or focal pathologic process. Soft tissues and spinal canal: No prevertebral fluid  or swelling. No visible canal hematoma. Disc levels: Mild to moderate degenerative disc disease and spondylosis at C4-5 and C6-7 noted. Upper chest: No acute abnormality Other: None IMPRESSION: 1. No evidence of acute intracranial abnormality. Atrophy and chronic small-vessel white matter ischemic changes. 2. No static evidence of acute injury to the cervical spine. Electronically Signed   By: Margarette Canada M.D.   On: 03/15/2018 17:56  Microbiology: No results found for this or any previous visit (from the past 240 hour(s)).   Labs: Basic Metabolic Panel: Recent Labs  Lab 03/15/18 1349 03/15/18 2000 03/16/18 0538 03/17/18 0711  NA 135  --  137 138  K 4.4  --  4.0 4.3  CL 103  --  108 106  CO2 23  --  19* 24  GLUCOSE 121*  --  88 91  BUN 27*  --  21 17  CREATININE 1.22* 1.10* 1.06* 1.09*  CALCIUM 9.6  --  9.0 9.3  MG  --   --   --  1.5*   Liver Function Tests: No results for input(s): AST, ALT, ALKPHOS, BILITOT, PROT, ALBUMIN in the last 168 hours. No results for input(s): LIPASE, AMYLASE in the last 168 hours. No results for input(s): AMMONIA in the last 168 hours. CBC: Recent Labs  Lab 03/15/18 1349 03/15/18 2000 03/16/18 0538 03/17/18 0711  WBC 16.3* 14.8* 12.7* 10.6*  HGB 11.6* 11.2* 11.3* 10.6*  HCT 36.1 34.7* 34.4* 32.8*  MCV 94.3 94.0 93.2 94.0  PLT 216 202 206 210   Cardiac Enzymes: Recent Labs  Lab 03/15/18 2000  CKTOTAL 216   BNP: BNP (last 3 results) No results for input(s): BNP in the last 8760 hours.  ProBNP (last 3 results) No results for input(s): PROBNP in the last 8760 hours.  CBG: No results for input(s): GLUCAP in the last 168 hours.     Signed:  Florencia Reasons MD, PhD  [facility] 03/17/2018, 9:11 AM

## 2018-03-17 NOTE — Progress Notes (Signed)
Patient Discharge: Disposition: Patient discharged to home. Education:   Reviewed medications, prescriptions, follow-up appointments and discharge instructions with the patient and sons, verbalized understanding. IV: Discontinued IV before discharge. Telemetry: Discontinued Tele before discharge. Transportation: Patient escorted out of the unit in w/c. Belongings: Patient took all her belongings with her.

## 2018-03-17 NOTE — Telephone Encounter (Signed)
Patient is approved for Orthovisc series, bilateral knee. [place] Patient is covered at 100% through insurance No Co-pay No PA required  Please schedule patient an appointment with Lovena Le or Dr. Estanislado Pandy.  Thank You.

## 2018-03-18 NOTE — Telephone Encounter (Signed)
Spoke with patient who stated she was just discharged from the hospital after falling on 03/15/18.  Patient states she is under the care of her PCP and will wait to get the injections until she is feeling better.

## 2018-03-19 LAB — URINE CULTURE

## 2018-03-31 DIAGNOSIS — J181 Lobar pneumonia, unspecified organism: Secondary | ICD-10-CM | POA: Diagnosis not present

## 2018-03-31 DIAGNOSIS — N390 Urinary tract infection, site not specified: Secondary | ICD-10-CM | POA: Diagnosis not present

## 2018-03-31 DIAGNOSIS — C9110 Chronic lymphocytic leukemia of B-cell type not having achieved remission: Secondary | ICD-10-CM | POA: Diagnosis not present

## 2018-03-31 DIAGNOSIS — N183 Chronic kidney disease, stage 3 (moderate): Secondary | ICD-10-CM | POA: Diagnosis not present

## 2018-03-31 DIAGNOSIS — E039 Hypothyroidism, unspecified: Secondary | ICD-10-CM | POA: Diagnosis not present

## 2018-04-03 DIAGNOSIS — Z961 Presence of intraocular lens: Secondary | ICD-10-CM | POA: Diagnosis not present

## 2018-04-03 DIAGNOSIS — H3589 Other specified retinal disorders: Secondary | ICD-10-CM | POA: Diagnosis not present

## 2018-04-03 DIAGNOSIS — M0609 Rheumatoid arthritis without rheumatoid factor, multiple sites: Secondary | ICD-10-CM | POA: Diagnosis not present

## 2018-04-03 DIAGNOSIS — H35363 Drusen (degenerative) of macula, bilateral: Secondary | ICD-10-CM | POA: Diagnosis not present

## 2018-04-03 DIAGNOSIS — Z79899 Other long term (current) drug therapy: Secondary | ICD-10-CM | POA: Diagnosis not present

## 2018-04-22 ENCOUNTER — Ambulatory Visit
Admission: RE | Admit: 2018-04-22 | Discharge: 2018-04-22 | Disposition: A | Discharge status: Home / Self Care | Payer: Medicare Other | Source: Ambulatory Visit | Attending: Family Medicine | Admitting: Family Medicine

## 2018-04-22 ENCOUNTER — Other Ambulatory Visit: Payer: Self-pay | Admitting: Family Medicine

## 2018-04-22 DIAGNOSIS — J181 Lobar pneumonia, unspecified organism: Secondary | ICD-10-CM

## 2018-04-22 DIAGNOSIS — R05 Cough: Secondary | ICD-10-CM | POA: Diagnosis not present

## 2018-06-09 DIAGNOSIS — M1712 Unilateral primary osteoarthritis, left knee: Secondary | ICD-10-CM | POA: Diagnosis not present

## 2018-06-09 DIAGNOSIS — M1711 Unilateral primary osteoarthritis, right knee: Secondary | ICD-10-CM | POA: Diagnosis not present

## 2018-06-22 DIAGNOSIS — H0015 Chalazion left lower eyelid: Secondary | ICD-10-CM | POA: Diagnosis not present

## 2018-07-01 DIAGNOSIS — I129 Hypertensive chronic kidney disease with stage 1 through stage 4 chronic kidney disease, or unspecified chronic kidney disease: Secondary | ICD-10-CM | POA: Diagnosis not present

## 2018-07-01 DIAGNOSIS — N183 Chronic kidney disease, stage 3 (moderate): Secondary | ICD-10-CM | POA: Diagnosis not present

## 2018-07-01 DIAGNOSIS — E890 Postprocedural hypothyroidism: Secondary | ICD-10-CM | POA: Diagnosis not present

## 2018-07-01 DIAGNOSIS — C9110 Chronic lymphocytic leukemia of B-cell type not having achieved remission: Secondary | ICD-10-CM | POA: Diagnosis not present

## 2018-07-01 DIAGNOSIS — E785 Hyperlipidemia, unspecified: Secondary | ICD-10-CM | POA: Diagnosis not present

## 2018-07-01 DIAGNOSIS — M130 Polyarthritis, unspecified: Secondary | ICD-10-CM | POA: Diagnosis not present

## 2018-07-01 DIAGNOSIS — E559 Vitamin D deficiency, unspecified: Secondary | ICD-10-CM | POA: Diagnosis not present

## 2018-07-01 DIAGNOSIS — K219 Gastro-esophageal reflux disease without esophagitis: Secondary | ICD-10-CM | POA: Diagnosis not present

## 2018-07-07 DIAGNOSIS — Z79899 Other long term (current) drug therapy: Secondary | ICD-10-CM | POA: Diagnosis not present

## 2018-07-07 DIAGNOSIS — H52203 Unspecified astigmatism, bilateral: Secondary | ICD-10-CM | POA: Diagnosis not present

## 2018-07-07 DIAGNOSIS — H40011 Open angle with borderline findings, low risk, right eye: Secondary | ICD-10-CM | POA: Diagnosis not present

## 2018-07-09 DIAGNOSIS — H903 Sensorineural hearing loss, bilateral: Secondary | ICD-10-CM | POA: Diagnosis not present

## 2018-07-09 DIAGNOSIS — H6122 Impacted cerumen, left ear: Secondary | ICD-10-CM | POA: Diagnosis not present

## 2018-07-13 DIAGNOSIS — M25569 Pain in unspecified knee: Secondary | ICD-10-CM | POA: Diagnosis not present

## 2018-07-13 DIAGNOSIS — M1990 Unspecified osteoarthritis, unspecified site: Secondary | ICD-10-CM | POA: Diagnosis not present

## 2018-07-13 DIAGNOSIS — M7989 Other specified soft tissue disorders: Secondary | ICD-10-CM | POA: Diagnosis not present

## 2018-07-13 DIAGNOSIS — Z79899 Other long term (current) drug therapy: Secondary | ICD-10-CM | POA: Diagnosis not present

## 2018-07-13 DIAGNOSIS — C9190 Lymphoid leukemia, unspecified not having achieved remission: Secondary | ICD-10-CM | POA: Diagnosis not present

## 2018-07-13 DIAGNOSIS — M79643 Pain in unspecified hand: Secondary | ICD-10-CM | POA: Diagnosis not present

## 2018-08-21 NOTE — Progress Notes (Deleted)
Office Visit Note  Patient: Donna Burch             Date of Birth: 19-May-1927           MRN: [id number]             PCP: Harlan Stains, MD Referring: Harlan Stains, MD Visit Date: 09/04/2018 Occupation: @GUAROCC @  Subjective:  No chief complaint on file.   History of Present Illness: Donna Burch is a 85 y.o. female ***   Activities of Daily Living:  Patient reports morning stiffness for *** {minute/hour:19697}.   Patient {ACTIONS;DENIES/REPORTS:21021675::"Denies"} nocturnal pain.  Difficulty dressing/grooming: {ACTIONS;DENIES/REPORTS:21021675::"Denies"} Difficulty climbing stairs: {ACTIONS;DENIES/REPORTS:21021675::"Denies"} Difficulty getting out of chair: {ACTIONS;DENIES/REPORTS:21021675::"Denies"} Difficulty using hands for taps, buttons, cutlery, and/or writing: {ACTIONS;DENIES/REPORTS:21021675::"Denies"}  No Rheumatology ROS completed.   PMFS History:  Patient Active Problem List   Diagnosis Date Noted  . UTI (urinary tract infection) 03/15/2018  . Community acquired pneumonia 03/15/2018  . Syncope and collapse 03/15/2018  . Primary osteoarthritis of both hands 01/09/2018  . Common bile duct calculi   . Gallstone pancreatitis 10/16/2017  . Pancreatitis 10/15/2017  . Inflammatory arthritis 05/29/2016  . Pain in joint involving multiple sites 05/29/2016  . Screening-pulmonary TB 05/29/2016  . High risk medication use 04/25/2016  . Bradycardia   . Symptomatic bradycardia   . Junctional escape rhythm   . Orthostatic hypotension   . Thyroid activity decreased   . Hypomagnesemia   . Syncope 09/01/2015  . Nausea & vomiting 09/01/2015  . Hyponatremia 09/01/2015  . Cough 09/01/2015  . Hypertension   . Hyperlipidemia   . GERD (gastroesophageal reflux disease)   . Hypothyroidism   . Gastroesophageal reflux disease without esophagitis   . CLL (chronic lymphocytic leukemia) ([place]) 07/14/2015  . Thyroid nodule, right 01/03/2015  . Thyroid nodule 01/03/2015    . Pelvic relaxation 04/08/2012  . Kidney stones 04/08/2012  . Recurrent UTI (urinary tract infection) 04/08/2012    Past Medical History:  Diagnosis Date  . Arthritis    "right middle finger; hands" (01/03/2015)  . CLL (chronic lymphocytic leukemia) ([place])   . Dyspnea on exertion    Improved with exercise  . Elevated brain natriuretic peptide (BNP) level    Slight  . Family history of adverse reaction to anesthesia    son, Marita Snellen, w/PONV  . Frequent UTI    "used to; none lately" (01/03/2015)  . Gastroenteritis 09/2010   c. dificile.  hospitalized in ICU x 3 days  . GERD (gastroesophageal reflux disease)   . Hiatal hernia   . History of Clostridium difficile ?03/2012   "after taking ATB for one of my UTI's"  . Hyperlipidemia   . Hypertension   . Hypothyroidism   . Nephrolithiasis   . Nocturia    x3  . Pneumonia    Required hospital stay at the time  . PONV (postoperative nausea and vomiting)   . Renal insufficiency   . Seasonal allergies   . Skin cancer    right anterior neck  . Wears glasses     Family History  Problem Relation Age of Onset  . Heart attack Mother   . Hyperlipidemia Mother   . Asthma Father   . Diabetes Father    Past Surgical History:  Procedure Laterality Date  . CATARACT EXTRACTION, BILATERAL  2010  . CHOLECYSTECTOMY N/A 10/17/2017   Procedure: LAPAROSCOPIC CHOLECYSTECTOMY WITH INTRAOPERATIVE CHOLANGIOGRAM;  Surgeon: Clovis Riley, MD;  Location: [facility];  Service: General;  Laterality: N/A;  .  COLONOSCOPY    . CYSTOSCOPY W/ STONE MANIPULATION Right   . CYSTOSCOPY WITH STENT PLACEMENT    . ERCP N/A 10/18/2017   Procedure: ENDOSCOPIC RETROGRADE CHOLANGIOPANCREATOGRAPHY (ERCP);  Surgeon: Ronnette Juniper, MD;  Location: [facility];  Service: Gastroenterology;  Laterality: N/A;  . ESOPHAGOGASTRODUODENOSCOPY    . ESOPHAGOGASTRODUODENOSCOPY (EGD) WITH ESOPHAGEAL DILATION    . KNEE ARTHROSCOPY Left   . LITHOTRIPSY     "didn't work"  . SKIN CANCER  EXCISION Right    anterior neck  . THYROID LOBECTOMY Right 01/03/2015  . THYROID LOBECTOMY Right 01/03/2015   Procedure: RIGHT THYROID LOBECTOMY;  Surgeon: Armandina Gemma, MD;  Location: [facility];  Service: General;  Laterality: Right;  . TONSILLECTOMY    . WISDOM TOOTH EXTRACTION     Social History   Social History Narrative  . Not on file   Immunization History  Administered Date(s) Administered  . Pneumococcal Polysaccharide-23 09/03/2015     Objective: Vital Signs: There were no vitals taken for this visit.   Physical Exam   Musculoskeletal Exam: ***  CDAI Exam: CDAI Score: Not documented Patient Global Assessment: Not documented; Provider Global Assessment: Not documented Swollen: Not documented; Tender: Not documented Joint Exam   Not documented   There is currently no information documented on the homunculus. Go to the Rheumatology activity and complete the homunculus joint exam.  Investigation: No additional findings.  Imaging: No results found.  Recent Labs: Lab Results  Component Value Date   WBC 10.6 (H) 03/17/2018   HGB 10.6 (L) 03/17/2018   PLT 210 03/17/2018   NA 138 03/17/2018   K 4.3 03/17/2018   CL 106 03/17/2018   CO2 24 03/17/2018   GLUCOSE 91 03/17/2018   BUN 17 03/17/2018   CREATININE 1.09 (H) 03/17/2018   BILITOT 0.3 02/13/2018   ALKPHOS 84 02/13/2018   AST 23 02/13/2018   ALT 11 02/13/2018   PROT 7.6 02/13/2018   ALBUMIN 3.3 (L) 02/13/2018   CALCIUM 9.3 03/17/2018   GFRAA 51 (L) 03/17/2018    Speciality Comments: No specialty comments available.  Procedures:  No procedures performed Allergies: Prednisone; Reclast [zoledronic acid]; Hydrochlorothiazide; and Lisinopril   Assessment / Plan:     Visit Diagnoses: No diagnosis found.   Orders: No orders of the defined types were placed in this encounter.  No orders of the defined types were placed in this encounter.   Face-to-face time spent with patient was *** minutes. Greater  than 50% of time was spent in counseling and coordination of care.  Follow-Up Instructions: No follow-ups on file.   Ofilia Neas, PA-C  Note - This record has been created using Dragon software.  Chart creation errors have been sought, but may not always  have been located. Such creation errors do not reflect on  the standard of medical care.

## 2018-08-24 ENCOUNTER — Encounter: Payer: Self-pay | Admitting: Podiatry

## 2018-08-24 ENCOUNTER — Ambulatory Visit (INDEPENDENT_AMBULATORY_CARE_PROVIDER_SITE_OTHER): Payer: Medicare Other | Admitting: Podiatry

## 2018-08-24 VITALS — BP 140/83 | HR 78 | Resp 14

## 2018-08-24 DIAGNOSIS — H9193 Unspecified hearing loss, bilateral: Secondary | ICD-10-CM | POA: Insufficient documentation

## 2018-08-24 DIAGNOSIS — M79674 Pain in right toe(s): Secondary | ICD-10-CM | POA: Diagnosis not present

## 2018-08-24 DIAGNOSIS — I700 Atherosclerosis of aorta: Secondary | ICD-10-CM | POA: Insufficient documentation

## 2018-08-24 DIAGNOSIS — M79675 Pain in left toe(s): Secondary | ICD-10-CM

## 2018-08-24 DIAGNOSIS — L600 Ingrowing nail: Secondary | ICD-10-CM | POA: Diagnosis not present

## 2018-08-24 DIAGNOSIS — B351 Tinea unguium: Secondary | ICD-10-CM | POA: Diagnosis not present

## 2018-08-24 DIAGNOSIS — E049 Nontoxic goiter, unspecified: Secondary | ICD-10-CM | POA: Insufficient documentation

## 2018-08-24 DIAGNOSIS — N183 Chronic kidney disease, stage 3 unspecified: Secondary | ICD-10-CM | POA: Insufficient documentation

## 2018-08-24 MED ORDER — DOXYCYCLINE HYCLATE 100 MG PO TABS: 100.0000 mg | ORAL_TABLET | Freq: Two times a day (BID) | ORAL | 0 refills | Status: DC

## 2018-08-24 NOTE — Progress Notes (Signed)
Subjective:    Patient ID: Donna Burch, female    DOB: 03/12/1927, 85 y.o.   MRN: [id number]  HPI 85 year old female presents the office today for concerns of a painful ingrown toenails of the right big toe, pointing the lateral aspect.  She states is been red around the corners and she cannot wear shoes because of the pain.  Denies any pus or any red streaks.  She also has a hammertoe on the left second toe.  It does hurt at times with certain shoes but currently denies any pain.  In general her nails are thick and discolored she has difficulty trimming them because of the arthritis may cause pain inside shoes.  She has no other concerns today.   Review of Systems  All other systems reviewed and are negative.  Past Medical History:  Diagnosis Date  . Arthritis    "right middle finger; hands" (01/03/2015)  . CLL (chronic lymphocytic leukemia) ([place])   . Dyspnea on exertion    Improved with exercise  . Elevated brain natriuretic peptide (BNP) level    Slight  . Family history of adverse reaction to anesthesia    son, Marita Snellen, w/PONV  . Frequent UTI    "used to; none lately" (01/03/2015)  . Gastroenteritis 09/2010   c. dificile.  hospitalized in ICU x 3 days  . GERD (gastroesophageal reflux disease)   . Hiatal hernia   . History of Clostridium difficile ?03/2012   "after taking ATB for one of my UTI's"  . Hyperlipidemia   . Hypertension   . Hypothyroidism   . Nephrolithiasis   . Nocturia    x3  . Pneumonia    Required hospital stay at the time  . PONV (postoperative nausea and vomiting)   . Renal insufficiency   . Seasonal allergies   . Skin cancer    right anterior neck  . Wears glasses     Past Surgical History:  Procedure Laterality Date  . CATARACT EXTRACTION, BILATERAL  2010  . CHOLECYSTECTOMY N/A 10/17/2017   Procedure: LAPAROSCOPIC CHOLECYSTECTOMY WITH INTRAOPERATIVE CHOLANGIOGRAM;  Surgeon: Clovis Riley, MD;  Location: [facility];  Service: General;  Laterality:  N/A;  . COLONOSCOPY    . CYSTOSCOPY W/ STONE MANIPULATION Right   . CYSTOSCOPY WITH STENT PLACEMENT    . ERCP N/A 10/18/2017   Procedure: ENDOSCOPIC RETROGRADE CHOLANGIOPANCREATOGRAPHY (ERCP);  Surgeon: Ronnette Juniper, MD;  Location: [facility];  Service: Gastroenterology;  Laterality: N/A;  . ESOPHAGOGASTRODUODENOSCOPY    . ESOPHAGOGASTRODUODENOSCOPY (EGD) WITH ESOPHAGEAL DILATION    . KNEE ARTHROSCOPY Left   . LITHOTRIPSY     "didn't work"  . SKIN CANCER EXCISION Right    anterior neck  . THYROID LOBECTOMY Right 01/03/2015  . THYROID LOBECTOMY Right 01/03/2015   Procedure: RIGHT THYROID LOBECTOMY;  Surgeon: Armandina Gemma, MD;  Location: [facility];  Service: General;  Laterality: Right;  . TONSILLECTOMY    . WISDOM TOOTH EXTRACTION       Current Outpatient Medications:  .  aspirin 81 MG chewable tablet, Take 81 mg by mouth daily., Disp: , Rfl:  .  atorvastatin (LIPITOR) 80 MG tablet, , Disp: , Rfl:  .  cholecalciferol (VITAMIN D) 1000 units tablet, Take 2 tablets (2,000 Units total) by mouth daily., Disp: 30 tablet, Rfl: 0 .  doxycycline (VIBRA-TABS) 100 MG tablet, Take 1 tablet (100 mg total) by mouth 2 (two) times daily., Disp: 20 tablet, Rfl: 0 .  ferrous sulfate 325 (65 FE) MG EC  tablet, Take 1 tablet (325 mg total) by mouth daily with breakfast., Disp: 30 tablet, Rfl: 0 .  fluticasone (FLONASE) 50 MCG/ACT nasal spray, Place 1 spray into both nostrils daily., Disp: 16 g, Rfl: 0 .  guaiFENesin (MUCINEX) 600 MG 12 hr tablet, Take 1 tablet (600 mg total) by mouth 2 (two) times daily., Disp: 30 tablet, Rfl: 0 .  Krill Oil (OMEGA-3) 500 MG CAPS, Take 500 mg by mouth daily., Disp: , Rfl:  .  levothyroxine (SYNTHROID, LEVOTHROID) 75 MCG tablet, Take 37.5-75 mcg by mouth See admin instructions. Take 1 tablet (75 mcg) by mouth on Monday thru Friday mornings before breakfast and take 1/2 tablet (37.5 mcg) on Saturday and Sunday before breakfast, Disp: , Rfl:  .  losartan (COZAAR) 100 MG tablet, Take  100 mg by mouth daily., Disp: , Rfl:  .  magnesium gluconate (MAGONATE) 30 MG tablet, Take 1 tablet (30 mg total) by mouth daily., Disp: 30 tablet, Rfl: 0 .  omeprazole (PRILOSEC) 40 MG capsule, Take 40 mg by mouth daily. , Disp: , Rfl:  .  OVER THE COUNTER MEDICATION, Apply 1 application topically 3 (three) times daily as needed (pain). Thera Works Relief Muscle Cramp Spray, Disp: , Rfl:  .  Probiotic Product ([place]) CAPS, Take 1 capsule by mouth daily., Disp: , Rfl:  .  vitamin C (ASCORBIC ACID) 250 MG tablet, Take 1 tablet (250 mg total) by mouth daily., Disp: 30 tablet, Rfl: 0 .  vitamin E 400 UNIT capsule, Take 400 Units by mouth daily., Disp: , Rfl:   Allergies  Allergen Reactions  . Prednisone Other (See Comments)    "Jittery"  . Reclast [Zoledronic Acid] Other (See Comments)    Pt did not have a memory, from the day after she took that medication   . Hydrochlorothiazide Other (See Comments)    Caused low sodium blood levels per [employer]  . Lisinopril Cough         Objective:   Physical Exam  General: AAO x3, NAD  Dermatological: The right lateral hallux ingrown along the nail border there is localized edema and erythema to this corner and there is quite a bit of pain present to the nail border.  There is no ascending cellulitis.  No fluctuation crepitation.  Nails are hypertrophic, dystrophic, brittle, discolored, elongated 10. No surrounding redness or drainage. Tenderness nails 1-5 bilaterally. No open lesions or pre-ulcerative lesions are identified today.  Vascular: DP pulses 2/4, PT pulse 1/4 and mild ankle swelling present.  CRT less than 3 seconds there is no pain with calf compression, swelling, warmth, erythema.   Neruologic: Grossly intact via light touch bilateral.   Musculoskeletal: Hammertoe deformity present left second toe without any ulcerations but there is mild erythema on the dorsal PIPJ flareup inside shoes but there is no skin  breakdown.  Muscular strength 5/5 in all groups tested bilateral.     Assessment & Plan:  85 year old female with symptomatic onychomycosis, right hallux lateral border ingrown toenail; left second digit hammertoe -Treatment options discussed including all alternatives, risks, and complications -Etiology of symptoms were discussed -At this time, she wants to proceed with partial nail removal without chemical matricectomy to the lateral hallux due to pain and inflammation.I did try to trim the toenail first however it was very painful for her and she continued pain after I trimmed it so therefore we proceeded with the procedure.  Risks and complications were discussed with the patient for which they understand  and  verbally consent to the procedure. Under sterile conditions a total of 3 mL of a mixture of 2% lidocaine plain and 0.5% Marcaine plain was infiltrated in a hallux block fashion. Once anesthetized, the skin was prepped in sterile fashion. A tourniquet was then applied. Next the lateral border of the hallux nail border was sharply excised making sure to remove the entire offending nail border. Once the nail was  Removed, the area was debrided and the underlying skin was intact. The area was irrigated and hemostasis was obtained.  A dry sterile dressing was applied. After application of the dressing the tourniquet was removed and there is found to be an immediate capillary refill time to the digit. The patient tolerated the procedure well any complications. Post procedure instructions were discussed the patient for which he verbally understood. Follow-up in one week for nail check or sooner if any problems are to arise. Discussed signs/symptoms of worsening infection and directed to call the office immediately should any occur or go directly to the emergency room. In the meantime, encouraged to call the office with any questions, concerns, changes symptoms. -I debrided the remaining toenails x9  without any complications or bleeding.  Follow-up in 1 or sooner if needed this is for nail check.  Trula Slade DPM

## 2018-08-24 NOTE — Patient Instructions (Signed)

## 2018-08-31 ENCOUNTER — Ambulatory Visit (INDEPENDENT_AMBULATORY_CARE_PROVIDER_SITE_OTHER): Payer: Self-pay

## 2018-08-31 DIAGNOSIS — L600 Ingrowing nail: Secondary | ICD-10-CM

## 2018-08-31 NOTE — Patient Instructions (Signed)

## 2018-09-03 DIAGNOSIS — B029 Zoster without complications: Secondary | ICD-10-CM | POA: Diagnosis not present

## 2018-09-03 DIAGNOSIS — L03113 Cellulitis of right upper limb: Secondary | ICD-10-CM | POA: Diagnosis not present

## 2018-09-04 ENCOUNTER — Ambulatory Visit: Payer: PRIVATE HEALTH INSURANCE | Admitting: Physician Assistant

## 2018-10-06 DIAGNOSIS — M25562 Pain in left knee: Secondary | ICD-10-CM | POA: Diagnosis not present

## 2018-10-06 DIAGNOSIS — M1712 Unilateral primary osteoarthritis, left knee: Secondary | ICD-10-CM | POA: Diagnosis not present

## 2018-10-06 DIAGNOSIS — M1711 Unilateral primary osteoarthritis, right knee: Secondary | ICD-10-CM | POA: Diagnosis not present

## 2018-10-06 DIAGNOSIS — M25561 Pain in right knee: Secondary | ICD-10-CM | POA: Diagnosis not present

## 2018-11-09 NOTE — Progress Notes (Signed)
Patient is here today for follow up visit from recent ingrown nail removal procedure. She states that she is not having any pain with it at this time.   Well healing nail border. No redness, no swelling, no drainage, no other signs and symptoms of infection.  The area is healed well and is scabbing over at this time.  Advised patient on signs and symptoms of infection she is to follow-up if needed.

## 2019-01-12 DIAGNOSIS — M1712 Unilateral primary osteoarthritis, left knee: Secondary | ICD-10-CM | POA: Diagnosis not present

## 2019-01-12 DIAGNOSIS — M1711 Unilateral primary osteoarthritis, right knee: Secondary | ICD-10-CM | POA: Diagnosis not present

## 2019-02-12 ENCOUNTER — Inpatient Hospital Stay: Payer: Medicare Other

## 2019-02-12 ENCOUNTER — Inpatient Hospital Stay: Payer: Medicare Other | Attending: Hematology | Admitting: Hematology

## 2019-02-18 DIAGNOSIS — E785 Hyperlipidemia, unspecified: Secondary | ICD-10-CM | POA: Diagnosis not present

## 2019-02-18 DIAGNOSIS — I129 Hypertensive chronic kidney disease with stage 1 through stage 4 chronic kidney disease, or unspecified chronic kidney disease: Secondary | ICD-10-CM | POA: Diagnosis not present

## 2019-02-18 DIAGNOSIS — M1990 Unspecified osteoarthritis, unspecified site: Secondary | ICD-10-CM | POA: Diagnosis not present

## 2019-02-18 DIAGNOSIS — E559 Vitamin D deficiency, unspecified: Secondary | ICD-10-CM | POA: Diagnosis not present

## 2019-02-18 DIAGNOSIS — E890 Postprocedural hypothyroidism: Secondary | ICD-10-CM | POA: Diagnosis not present

## 2019-02-18 DIAGNOSIS — N183 Chronic kidney disease, stage 3 (moderate): Secondary | ICD-10-CM | POA: Diagnosis not present

## 2019-02-18 DIAGNOSIS — K219 Gastro-esophageal reflux disease without esophagitis: Secondary | ICD-10-CM | POA: Diagnosis not present

## 2019-02-18 DIAGNOSIS — C9110 Chronic lymphocytic leukemia of B-cell type not having achieved remission: Secondary | ICD-10-CM | POA: Diagnosis not present

## 2019-02-18 DIAGNOSIS — Z0000 Encounter for general adult medical examination without abnormal findings: Secondary | ICD-10-CM | POA: Diagnosis not present

## 2019-03-30 DIAGNOSIS — E039 Hypothyroidism, unspecified: Secondary | ICD-10-CM | POA: Diagnosis not present

## 2019-03-30 DIAGNOSIS — Z23 Encounter for immunization: Secondary | ICD-10-CM | POA: Diagnosis not present

## 2019-03-30 DIAGNOSIS — Z8639 Personal history of other endocrine, nutritional and metabolic disease: Secondary | ICD-10-CM | POA: Diagnosis not present

## 2019-05-06 DIAGNOSIS — M1712 Unilateral primary osteoarthritis, left knee: Secondary | ICD-10-CM | POA: Diagnosis not present

## 2019-05-06 DIAGNOSIS — M1711 Unilateral primary osteoarthritis, right knee: Secondary | ICD-10-CM | POA: Diagnosis not present

## 2019-07-22 DIAGNOSIS — E559 Vitamin D deficiency, unspecified: Secondary | ICD-10-CM | POA: Diagnosis not present

## 2019-07-22 DIAGNOSIS — Z79899 Other long term (current) drug therapy: Secondary | ICD-10-CM | POA: Diagnosis not present

## 2019-07-22 DIAGNOSIS — C9110 Chronic lymphocytic leukemia of B-cell type not having achieved remission: Secondary | ICD-10-CM | POA: Diagnosis not present

## 2019-07-22 DIAGNOSIS — N183 Chronic kidney disease, stage 3 unspecified: Secondary | ICD-10-CM | POA: Diagnosis not present

## 2019-07-22 DIAGNOSIS — I700 Atherosclerosis of aorta: Secondary | ICD-10-CM | POA: Diagnosis not present

## 2019-07-22 DIAGNOSIS — K219 Gastro-esophageal reflux disease without esophagitis: Secondary | ICD-10-CM | POA: Diagnosis not present

## 2019-07-22 DIAGNOSIS — E785 Hyperlipidemia, unspecified: Secondary | ICD-10-CM | POA: Diagnosis not present

## 2019-07-22 DIAGNOSIS — I129 Hypertensive chronic kidney disease with stage 1 through stage 4 chronic kidney disease, or unspecified chronic kidney disease: Secondary | ICD-10-CM | POA: Diagnosis not present

## 2019-07-22 DIAGNOSIS — E441 Mild protein-calorie malnutrition: Secondary | ICD-10-CM | POA: Diagnosis not present

## 2019-08-23 DIAGNOSIS — M1711 Unilateral primary osteoarthritis, right knee: Secondary | ICD-10-CM | POA: Diagnosis not present

## 2019-08-23 DIAGNOSIS — M25562 Pain in left knee: Secondary | ICD-10-CM | POA: Diagnosis not present

## 2019-08-23 DIAGNOSIS — M1712 Unilateral primary osteoarthritis, left knee: Secondary | ICD-10-CM | POA: Diagnosis not present

## 2019-08-23 DIAGNOSIS — M25561 Pain in right knee: Secondary | ICD-10-CM | POA: Diagnosis not present

## 2019-10-05 DIAGNOSIS — M1990 Unspecified osteoarthritis, unspecified site: Secondary | ICD-10-CM | POA: Diagnosis not present

## 2019-10-05 DIAGNOSIS — I129 Hypertensive chronic kidney disease with stage 1 through stage 4 chronic kidney disease, or unspecified chronic kidney disease: Secondary | ICD-10-CM | POA: Diagnosis not present

## 2019-10-05 DIAGNOSIS — M810 Age-related osteoporosis without current pathological fracture: Secondary | ICD-10-CM | POA: Diagnosis not present

## 2019-10-05 DIAGNOSIS — E785 Hyperlipidemia, unspecified: Secondary | ICD-10-CM | POA: Diagnosis not present

## 2019-10-05 DIAGNOSIS — N183 Chronic kidney disease, stage 3 unspecified: Secondary | ICD-10-CM | POA: Diagnosis not present

## 2019-10-05 DIAGNOSIS — E890 Postprocedural hypothyroidism: Secondary | ICD-10-CM | POA: Diagnosis not present

## 2019-10-25 DIAGNOSIS — C9110 Chronic lymphocytic leukemia of B-cell type not having achieved remission: Secondary | ICD-10-CM | POA: Diagnosis not present

## 2019-10-25 DIAGNOSIS — N183 Chronic kidney disease, stage 3 unspecified: Secondary | ICD-10-CM | POA: Diagnosis not present

## 2019-10-25 DIAGNOSIS — E785 Hyperlipidemia, unspecified: Secondary | ICD-10-CM | POA: Diagnosis not present

## 2019-10-25 DIAGNOSIS — F419 Anxiety disorder, unspecified: Secondary | ICD-10-CM | POA: Diagnosis not present

## 2019-10-25 DIAGNOSIS — E441 Mild protein-calorie malnutrition: Secondary | ICD-10-CM | POA: Diagnosis not present

## 2019-10-25 DIAGNOSIS — E890 Postprocedural hypothyroidism: Secondary | ICD-10-CM | POA: Diagnosis not present

## 2019-10-25 DIAGNOSIS — I129 Hypertensive chronic kidney disease with stage 1 through stage 4 chronic kidney disease, or unspecified chronic kidney disease: Secondary | ICD-10-CM | POA: Diagnosis not present

## 2019-10-25 DIAGNOSIS — M1712 Unilateral primary osteoarthritis, left knee: Secondary | ICD-10-CM | POA: Diagnosis not present

## 2019-10-25 DIAGNOSIS — M1711 Unilateral primary osteoarthritis, right knee: Secondary | ICD-10-CM | POA: Diagnosis not present

## 2020-01-07 DIAGNOSIS — M810 Age-related osteoporosis without current pathological fracture: Secondary | ICD-10-CM | POA: Diagnosis not present

## 2020-01-07 DIAGNOSIS — E890 Postprocedural hypothyroidism: Secondary | ICD-10-CM | POA: Diagnosis not present

## 2020-01-07 DIAGNOSIS — N183 Chronic kidney disease, stage 3 unspecified: Secondary | ICD-10-CM | POA: Diagnosis not present

## 2020-01-07 DIAGNOSIS — E785 Hyperlipidemia, unspecified: Secondary | ICD-10-CM | POA: Diagnosis not present

## 2020-01-07 DIAGNOSIS — I129 Hypertensive chronic kidney disease with stage 1 through stage 4 chronic kidney disease, or unspecified chronic kidney disease: Secondary | ICD-10-CM | POA: Diagnosis not present

## 2020-01-07 DIAGNOSIS — M1990 Unspecified osteoarthritis, unspecified site: Secondary | ICD-10-CM | POA: Diagnosis not present

## 2020-01-11 DIAGNOSIS — M25561 Pain in right knee: Secondary | ICD-10-CM | POA: Diagnosis not present

## 2020-01-11 DIAGNOSIS — M1711 Unilateral primary osteoarthritis, right knee: Secondary | ICD-10-CM | POA: Diagnosis not present

## 2020-01-11 DIAGNOSIS — M25562 Pain in left knee: Secondary | ICD-10-CM | POA: Diagnosis not present

## 2020-01-11 DIAGNOSIS — M1712 Unilateral primary osteoarthritis, left knee: Secondary | ICD-10-CM | POA: Diagnosis not present

## 2020-02-09 DIAGNOSIS — N183 Chronic kidney disease, stage 3 unspecified: Secondary | ICD-10-CM | POA: Diagnosis not present

## 2020-02-09 DIAGNOSIS — M1990 Unspecified osteoarthritis, unspecified site: Secondary | ICD-10-CM | POA: Diagnosis not present

## 2020-02-09 DIAGNOSIS — E890 Postprocedural hypothyroidism: Secondary | ICD-10-CM | POA: Diagnosis not present

## 2020-02-09 DIAGNOSIS — I129 Hypertensive chronic kidney disease with stage 1 through stage 4 chronic kidney disease, or unspecified chronic kidney disease: Secondary | ICD-10-CM | POA: Diagnosis not present

## 2020-02-09 DIAGNOSIS — M810 Age-related osteoporosis without current pathological fracture: Secondary | ICD-10-CM | POA: Diagnosis not present

## 2020-02-09 DIAGNOSIS — E785 Hyperlipidemia, unspecified: Secondary | ICD-10-CM | POA: Diagnosis not present

## 2020-03-06 DIAGNOSIS — Z23 Encounter for immunization: Secondary | ICD-10-CM | POA: Diagnosis not present

## 2020-03-06 DIAGNOSIS — N183 Chronic kidney disease, stage 3 unspecified: Secondary | ICD-10-CM | POA: Diagnosis not present

## 2020-03-06 DIAGNOSIS — C9110 Chronic lymphocytic leukemia of B-cell type not having achieved remission: Secondary | ICD-10-CM | POA: Diagnosis not present

## 2020-03-06 DIAGNOSIS — I700 Atherosclerosis of aorta: Secondary | ICD-10-CM | POA: Diagnosis not present

## 2020-03-06 DIAGNOSIS — E559 Vitamin D deficiency, unspecified: Secondary | ICD-10-CM | POA: Diagnosis not present

## 2020-03-06 DIAGNOSIS — R413 Other amnesia: Secondary | ICD-10-CM | POA: Diagnosis not present

## 2020-03-06 DIAGNOSIS — M810 Age-related osteoporosis without current pathological fracture: Secondary | ICD-10-CM | POA: Diagnosis not present

## 2020-03-06 DIAGNOSIS — E785 Hyperlipidemia, unspecified: Secondary | ICD-10-CM | POA: Diagnosis not present

## 2020-03-06 DIAGNOSIS — Z0000 Encounter for general adult medical examination without abnormal findings: Secondary | ICD-10-CM | POA: Diagnosis not present

## 2020-03-06 DIAGNOSIS — I129 Hypertensive chronic kidney disease with stage 1 through stage 4 chronic kidney disease, or unspecified chronic kidney disease: Secondary | ICD-10-CM | POA: Diagnosis not present

## 2020-03-06 DIAGNOSIS — E890 Postprocedural hypothyroidism: Secondary | ICD-10-CM | POA: Diagnosis not present

## 2020-04-04 DIAGNOSIS — E039 Hypothyroidism, unspecified: Secondary | ICD-10-CM | POA: Diagnosis not present

## 2020-04-04 DIAGNOSIS — Z23 Encounter for immunization: Secondary | ICD-10-CM | POA: Diagnosis not present

## 2020-04-04 DIAGNOSIS — R21 Rash and other nonspecific skin eruption: Secondary | ICD-10-CM | POA: Diagnosis not present

## 2020-04-04 DIAGNOSIS — Z8639 Personal history of other endocrine, nutritional and metabolic disease: Secondary | ICD-10-CM | POA: Diagnosis not present

## 2020-04-06 ENCOUNTER — Other Ambulatory Visit: Payer: Medicare Other

## 2020-04-06 ENCOUNTER — Ambulatory Visit: Payer: Medicare Other | Admitting: Hematology

## 2020-04-10 DIAGNOSIS — M25562 Pain in left knee: Secondary | ICD-10-CM | POA: Diagnosis not present

## 2020-04-10 DIAGNOSIS — M1711 Unilateral primary osteoarthritis, right knee: Secondary | ICD-10-CM | POA: Diagnosis not present

## 2020-04-10 DIAGNOSIS — M67912 Unspecified disorder of synovium and tendon, left shoulder: Secondary | ICD-10-CM | POA: Diagnosis not present

## 2020-04-10 DIAGNOSIS — M67911 Unspecified disorder of synovium and tendon, right shoulder: Secondary | ICD-10-CM | POA: Diagnosis not present

## 2020-04-10 DIAGNOSIS — M25561 Pain in right knee: Secondary | ICD-10-CM | POA: Diagnosis not present

## 2020-04-25 DIAGNOSIS — Z23 Encounter for immunization: Secondary | ICD-10-CM | POA: Diagnosis not present

## 2020-04-25 DIAGNOSIS — E890 Postprocedural hypothyroidism: Secondary | ICD-10-CM | POA: Diagnosis not present

## 2020-05-01 ENCOUNTER — Telehealth: Payer: Self-pay

## 2020-05-01 ENCOUNTER — Inpatient Hospital Stay: Payer: Medicare Other | Attending: Hematology | Admitting: Hematology

## 2020-05-01 ENCOUNTER — Other Ambulatory Visit: Payer: Self-pay

## 2020-05-01 ENCOUNTER — Inpatient Hospital Stay (HOSPITAL_BASED_OUTPATIENT_CLINIC_OR_DEPARTMENT_OTHER): Payer: Medicare Other | Admitting: Hematology

## 2020-05-01 VITALS — BP 178/100 | HR 91 | Temp 97.3°F | Resp 18 | Ht 63.0 in | Wt 119.5 lb

## 2020-05-01 DIAGNOSIS — C9110 Chronic lymphocytic leukemia of B-cell type not having achieved remission: Secondary | ICD-10-CM | POA: Diagnosis not present

## 2020-05-01 DIAGNOSIS — M67912 Unspecified disorder of synovium and tendon, left shoulder: Secondary | ICD-10-CM | POA: Diagnosis not present

## 2020-05-01 LAB — CBC WITH DIFFERENTIAL (CANCER CENTER ONLY)
Abs Immature Granulocytes: 0.02 10*3/uL (ref 0.00–0.07)
Basophils Absolute: 0 10*3/uL (ref 0.0–0.1)
Basophils Relative: 0 %
Eosinophils Absolute: 0 10*3/uL (ref 0.0–0.5)
Eosinophils Relative: 0 %
HCT: 33.9 % — ABNORMAL LOW (ref 36.0–46.0)
Hemoglobin: 11.1 g/dL — ABNORMAL LOW (ref 12.0–15.0)
Immature Granulocytes: 0 %
Lymphocytes Relative: 30 %
Lymphs Abs: 2 10*3/uL (ref 0.7–4.0)
MCH: 30.3 pg (ref 26.0–34.0)
MCHC: 32.7 g/dL (ref 30.0–36.0)
MCV: 92.6 fL (ref 80.0–100.0)
Monocytes Absolute: 0.1 10*3/uL (ref 0.1–1.0)
Monocytes Relative: 2 %
Neutro Abs: 4.6 10*3/uL (ref 1.7–7.7)
Neutrophils Relative %: 68 %
Platelet Count: 217 10*3/uL (ref 150–400)
RBC: 3.66 MIL/uL — ABNORMAL LOW (ref 3.87–5.11)
RDW: 13.6 % (ref 11.5–15.5)
WBC Count: 6.7 10*3/uL (ref 4.0–10.5)
nRBC: 0 % (ref 0.0–0.2)

## 2020-05-01 LAB — BASIC METABOLIC PANEL - CANCER CENTER ONLY
Anion gap: 8 (ref 5–15)
BUN: 13 mg/dL (ref 8–23)
CO2: 24 mmol/L (ref 22–32)
Calcium: 9.1 mg/dL (ref 8.9–10.3)
Chloride: 108 mmol/L (ref 98–111)
Creatinine: 1.04 mg/dL — ABNORMAL HIGH (ref 0.44–1.00)
GFR, Estimated: 51 mL/min — ABNORMAL LOW (ref >60)
Glucose, Bld: 117 mg/dL — ABNORMAL HIGH (ref 70–99)
Potassium: 3.6 mmol/L (ref 3.5–5.1)
Sodium: 140 mmol/L (ref 135–145)

## 2020-05-01 LAB — MAGNESIUM: Magnesium: 1.1 mg/dL — ABNORMAL LOW (ref 1.7–2.4)

## 2020-05-01 NOTE — Telephone Encounter (Signed)
Spoke with patient's son per Dr Irene Limbo about medication. Requested to call and leave information on home voicemail. Called patient's son home voicemail and left detailed message per request.

## 2020-05-01 NOTE — Progress Notes (Signed)
[facility]  HEMATOLOGY ONCOLOGY PROGRESS NOTE  Date of service: 05/01/20  Patient Care Team: Harlan Stains, MD as PCP - General (Family Medicine)  CC: f/u for continued mx of CLL  Diagnosis: CLL Rai Stage 0  Current Treatment: Observation  INTERVAL HISTORY: Donna Burch is here for management and evaluation of her CLL. We are joined today by her son. The patient's last visit with [place] was on 02/13/2018. The pt reports that she is doing well overall.  The pt reports that she had her COVID19 vaccines and is preparing to receive the booster soon. Pt recently received Cortizone injections in her left shoulder. She denies any new or bothersome symptoms.   Lab results today (05/01/20) of CBC w/diff and BMP is as follows: all values are WNL except for RBC at 3.66, Hgb at 11.1, HCT at 33.9, Glucose at 117, Creatinine at 1.04, GFR Est at 51. 05/01/2020 Magnesium at 1.1  On review of systems, pt denies abdominal pain, leg swelling and any other symptoms.   REVIEW OF SYSTEMS:   A 10+ POINT REVIEW OF SYSTEMS WAS OBTAINED including neurology, dermatology, psychiatry, cardiac, respiratory, lymph, extremities, GI, GU, Musculoskeletal, constitutional, breasts, reproductive, HEENT.  All pertinent positives are noted in the HPI.  All others are negative.  . Past Medical History:  Diagnosis Date  . Arthritis    "right middle finger; hands" (01/03/2015)  . CLL (chronic lymphocytic leukemia) ([place])   . Dyspnea on exertion    Improved with exercise  . Elevated brain natriuretic peptide (BNP) level    Slight  . Family history of adverse reaction to anesthesia    son, Donna Burch, w/PONV  . Frequent UTI    "used to; none lately" (01/03/2015)  . Gastroenteritis 09/2010   c. dificile.  hospitalized in ICU x 3 days  . GERD (gastroesophageal reflux disease)   . Hiatal hernia   . History of Clostridium difficile ?03/2012   "after taking ATB for one of my UTI's"  . Hyperlipidemia   . Hypertension   . Hypothyroidism   .  Nephrolithiasis   . Nocturia    x3  . Pneumonia    Required hospital stay at the time  . PONV (postoperative nausea and vomiting)   . Renal insufficiency   . Seasonal allergies   . Skin cancer    right anterior neck  . Wears glasses      Past Surgical History:  Procedure Laterality Date  . CATARACT EXTRACTION, BILATERAL  2010  . CHOLECYSTECTOMY N/A 10/17/2017   Procedure: LAPAROSCOPIC CHOLECYSTECTOMY WITH INTRAOPERATIVE CHOLANGIOGRAM;  Surgeon: Clovis Riley, MD;  Location: [facility];  Service: General;  Laterality: N/A;  . COLONOSCOPY    . CYSTOSCOPY W/ STONE MANIPULATION Right   . CYSTOSCOPY WITH STENT PLACEMENT    . ERCP N/A 10/18/2017   Procedure: ENDOSCOPIC RETROGRADE CHOLANGIOPANCREATOGRAPHY (ERCP);  Surgeon: Ronnette Juniper, MD;  Location: [facility];  Service: Gastroenterology;  Laterality: N/A;  . ESOPHAGOGASTRODUODENOSCOPY    . ESOPHAGOGASTRODUODENOSCOPY (EGD) WITH ESOPHAGEAL DILATION    . KNEE ARTHROSCOPY Left   . LITHOTRIPSY     "didn't work"  . SKIN CANCER EXCISION Right    anterior neck  . THYROID LOBECTOMY Right 01/03/2015  . THYROID LOBECTOMY Right 01/03/2015   Procedure: RIGHT THYROID LOBECTOMY;  Surgeon: Armandina Gemma, MD;  Location: [facility];  Service: General;  Laterality: Right;  . TONSILLECTOMY    . WISDOM TOOTH EXTRACTION      Social History   Tobacco Use  . Smoking status:  Never Smoker  . Smokeless tobacco: Never Used  Vaping Use  . Vaping Use: Never used  Substance Use Topics  . Alcohol use: No  . Drug use: Never    ALLERGIES:  is allergic to prednisone, reclast [zoledronic acid], hydrochlorothiazide, and lisinopril.  MEDICATIONS:  Current Outpatient Medications  Medication Sig Dispense Refill  . aspirin 81 MG chewable tablet Take 81 mg by mouth daily.    [facility]na [facility] atorvastatin (LIPITOR) 80 MG tablet     . cholecalciferol (VITAMIN D) 1000 units tablet Take 2 tablets (2,000 Units total) by mouth daily. 30 tablet 0  . doxycycline (VIBRA-TABS) 100 MG  tablet Take 1 tablet (100 mg total) by mouth 2 (two) times daily. 20 tablet 0  . ferrous sulfate 325 (65 FE) MG EC tablet Take 1 tablet (325 mg total) by mouth daily with breakfast. 30 tablet 0  . fluticasone (FLONASE) 50 MCG/ACT nasal spray Place 1 spray into both nostrils daily. 16 g 0  . guaiFENesin (MUCINEX) 600 MG 12 hr tablet Take 1 tablet (600 mg total) by mouth 2 (two) times daily. 30 tablet 0  . Krill Oil (OMEGA-3) 500 MG CAPS Take 500 mg by mouth daily.    Donna Kitchen levothyroxine (SYNTHROID, LEVOTHROID) 75 MCG tablet Take 37.5-75 mcg by mouth See admin instructions. Take 1 tablet (75 mcg) by mouth on Monday thru Friday mornings before breakfast and take 1/2 tablet (37.5 mcg) on Saturday and Sunday before breakfast    . losartan (COZAAR) 100 MG tablet Take 100 mg by mouth daily.    . magnesium gluconate (MAGONATE) 30 MG tablet Take 1 tablet (30 mg total) by mouth daily. 30 tablet 0  . omeprazole (PRILOSEC) 40 MG capsule Take 40 mg by mouth daily.     Donna Kitchen OVER THE COUNTER MEDICATION Apply 1 application topically 3 (three) times daily as needed (pain). Thera Works Relief Muscle Cramp Spray    . Probiotic Product ([place]) CAPS Take 1 capsule by mouth daily.    . vitamin C (ASCORBIC ACID) 250 MG tablet Take 1 tablet (250 mg total) by mouth daily. 30 tablet 0  . vitamin E 400 UNIT capsule Take 400 Units by mouth daily.     No current facility-administered medications for this visit.    PHYSICAL EXAMINATION: ECOG PERFORMANCE STATUS: 2 - Symptomatic, <50% confined to bed   There were no vitals filed for this visit.  There were no vitals filed for this visit. .There is no height or weight on file to calculate BMI.   Exam was given in a chair   GENERAL:alert, in no acute distress and comfortable SKIN: no acute rashes, no significant lesions EYES: conjunctiva are pink and non-injected, sclera anicteric OROPHARYNX: MMM, no exudates, no oropharyngeal erythema or ulceration NECK:  supple, no JVD LYMPH:  no palpable lymphadenopathy in the cervical, axillary or inguinal regions LUNGS: clear to auscultation b/l with normal respiratory effort HEART: regular rate & rhythm ABDOMEN:  normoactive bowel sounds , non tender, not distended. No palpable hepatosplenomegaly.  Extremity: no pedal edema PSYCH: alert & oriented x 3 with fluent speech NEURO: no focal motor/sensory deficits  LABORATORY DATA:   I have reviewed the data as listed  . CBC Latest Ref Rng & Units 03/17/2018 03/16/2018 03/15/2018  WBC 4.0 - 10.5 K/uL 10.6(H) 12.7(H) 14.8(H)  Hemoglobin 12.0 - 15.0 g/dL 10.6(L) 11.3(L) 11.2(L)  Hematocrit 36 - 46 % 32.8(L) 34.4(L) 34.7(L)  Platelets 150 - 400 K/uL 210 206 202   . CBC  Component Value Date/Time   WBC 10.6 (H) 03/17/2018 0711   RBC 3.49 (L) 03/17/2018 0711   HGB 10.6 (L) 03/17/2018 0711   HGB 11.0 (L) 02/13/2018 1041   HGB 11.4 (L) 01/17/2017 1122   HCT 32.8 (L) 03/17/2018 0711   HCT 34.4 (L) 01/17/2017 1122   PLT 210 03/17/2018 0711   PLT 234 02/13/2018 1041   PLT 216 01/17/2017 1122   MCV 94.0 03/17/2018 0711   MCV 94.2 01/17/2017 1122   MCH 30.4 03/17/2018 0711   MCHC 32.3 03/17/2018 0711   RDW 13.2 03/17/2018 0711   RDW 13.6 01/17/2017 1122   LYMPHSABS 6.3 (H) 02/13/2018 1041   LYMPHSABS 7.6 (H) 01/17/2017 1122   MONOABS 0.7 02/13/2018 1041   MONOABS 0.7 01/17/2017 1122   EOSABS 0.2 02/13/2018 1041   EOSABS 0.1 01/17/2017 1122   BASOSABS 0.0 02/13/2018 1041   BASOSABS 0.0 01/17/2017 1122     . CMP Latest Ref Rng & Units 03/17/2018 03/16/2018 03/15/2018  Glucose 70 - 99 mg/dL 91 88 -  BUN 8 - 23 mg/dL 17 21 -  Creatinine 0.44 - 1.00 mg/dL 1.09(H) 1.06(H) 1.10(H)  Sodium 135 - 145 mmol/L 138 137 -  Potassium 3.5 - 5.1 mmol/L 4.3 4.0 -  Chloride 98 - 111 mmol/L 106 108 -  CO2 22 - 32 mmol/L 24 19(L) -  Calcium 8.9 - 10.3 mg/dL 9.3 9.0 -  Total Protein 6.5 - 8.1 g/dL - - -  Total Bilirubin 0.3 - 1.2 mg/dL - - -  Alkaline Phos 38  - 126 U/L - - -  AST 15 - 41 U/L - - -  ALT 0 - 44 U/L - - -     RADIOGRAPHIC STUDIES: I have personally reviewed the radiological images as listed and agreed with the findings in the report. No results found.  ASSESSMENT & PLAN:   86 y.o. female with multiple medical comorbidities as noted above.  1) CLL. Rai stage 0. FISH panel shows 13q deletion -likely representing good prognostic indicator Patient has no clinically discernible lymphadenopathy, splenomegaly. Stable chronic mild anemia which remains stable but this seems to be less likely from CLL. Given her pelgeroid neutrophils and acanthocytes cannot rule out some element of age-related myelodysplasia. Also little has element of ACD due to her RA. Patient has no overt constitutional symptoms such as fevers/chills/drenching night sweats/weight loss more than 10% of body weight. WBC counts today are normal at 9.7k  No thrombocytopenia Hgb relatively stable at 11  PLAN:  -Discussed pt labwork today, 05/01/20; WBC look good, no anemia or thrombocytopenia, blood chemistries are stable, Magnesium is low. -No lab or clinical evidence of CLL progression at this time. No indication to begin treatment. Will continue watchful observation.  -Recommend pt take 2 Magnesium pills BID and f/u with PCP in 1-2 weeks. -Will see back in 12 months with labs   FOLLOW UP: RTC with Dr Irene Limbo with labs in 12 months    The total time spent in the appt was 20 minutes and more than 50% was on counseling and direct patient cares.  All of the patient's questions were answered with apparent satisfaction. The patient knows to call the clinic with any problems, questions or concerns.    Sullivan Lone MD [place] AAHIVMS [facility] Hematology/Oncology Physician [facility]  (Office):       [phone] (Work cell):  [phone] (Fax):           [phone]  I, Yevette Edwards,  am acting as a scribe for Dr. Sullivan Lone.   .I have reviewed the  above documentation for accuracy and completeness, and I agree with the above. Brunetta Genera MD

## 2020-05-01 NOTE — Telephone Encounter (Signed)
-----   Message from Brunetta Genera, MD sent at 05/01/2020  3:07 PM EST ----- Plz let patient know her magnesium level is low @ 1.1. SHe needs to increase her slow mag to 2 tab twice daily and f/u with PCP In 2 weeks.

## 2020-05-07 NOTE — Progress Notes (Signed)
This encounter was created in error - please disregard.

## 2020-05-25 ENCOUNTER — Emergency Department (HOSPITAL_COMMUNITY): Payer: Medicare Other

## 2020-05-25 ENCOUNTER — Inpatient Hospital Stay (HOSPITAL_COMMUNITY)
Admission: EM | Admit: 2020-05-25 | Discharge: 2020-05-29 | DRG: 640 | Disposition: A | Discharge status: Home Health | Payer: Medicare Other | Attending: Internal Medicine | Admitting: Internal Medicine

## 2020-05-25 ENCOUNTER — Encounter (HOSPITAL_COMMUNITY): Payer: Self-pay

## 2020-05-25 DIAGNOSIS — Z888 Allergy status to other drugs, medicaments and biological substances status: Secondary | ICD-10-CM

## 2020-05-25 DIAGNOSIS — Z20822 Contact with and (suspected) exposure to covid-19: Secondary | ICD-10-CM | POA: Diagnosis present

## 2020-05-25 DIAGNOSIS — S79911A Unspecified injury of right hip, initial encounter: Secondary | ICD-10-CM | POA: Diagnosis not present

## 2020-05-25 DIAGNOSIS — I129 Hypertensive chronic kidney disease with stage 1 through stage 4 chronic kidney disease, or unspecified chronic kidney disease: Secondary | ICD-10-CM | POA: Diagnosis present

## 2020-05-25 DIAGNOSIS — W19XXXA Unspecified fall, initial encounter: Secondary | ICD-10-CM

## 2020-05-25 DIAGNOSIS — E538 Deficiency of other specified B group vitamins: Secondary | ICD-10-CM | POA: Diagnosis present

## 2020-05-25 DIAGNOSIS — R410 Disorientation, unspecified: Secondary | ICD-10-CM

## 2020-05-25 DIAGNOSIS — Z85828 Personal history of other malignant neoplasm of skin: Secondary | ICD-10-CM

## 2020-05-25 DIAGNOSIS — R4182 Altered mental status, unspecified: Secondary | ICD-10-CM | POA: Diagnosis not present

## 2020-05-25 DIAGNOSIS — A419 Sepsis, unspecified organism: Secondary | ICD-10-CM | POA: Diagnosis not present

## 2020-05-25 DIAGNOSIS — I10 Essential (primary) hypertension: Secondary | ICD-10-CM | POA: Diagnosis not present

## 2020-05-25 DIAGNOSIS — Z83438 Family history of other disorder of lipoprotein metabolism and other lipidemia: Secondary | ICD-10-CM

## 2020-05-25 DIAGNOSIS — M19041 Primary osteoarthritis, right hand: Secondary | ICD-10-CM | POA: Diagnosis present

## 2020-05-25 DIAGNOSIS — E039 Hypothyroidism, unspecified: Secondary | ICD-10-CM | POA: Diagnosis present

## 2020-05-25 DIAGNOSIS — R55 Syncope and collapse: Secondary | ICD-10-CM | POA: Diagnosis not present

## 2020-05-25 DIAGNOSIS — G9341 Metabolic encephalopathy: Secondary | ICD-10-CM | POA: Diagnosis not present

## 2020-05-25 DIAGNOSIS — M6282 Rhabdomyolysis: Secondary | ICD-10-CM | POA: Diagnosis not present

## 2020-05-25 DIAGNOSIS — Z8249 Family history of ischemic heart disease and other diseases of the circulatory system: Secondary | ICD-10-CM

## 2020-05-25 DIAGNOSIS — Z825 Family history of asthma and other chronic lower respiratory diseases: Secondary | ICD-10-CM

## 2020-05-25 DIAGNOSIS — S199XXA Unspecified injury of neck, initial encounter: Secondary | ICD-10-CM | POA: Diagnosis not present

## 2020-05-25 DIAGNOSIS — M19042 Primary osteoarthritis, left hand: Secondary | ICD-10-CM | POA: Diagnosis present

## 2020-05-25 DIAGNOSIS — E860 Dehydration: Secondary | ICD-10-CM | POA: Diagnosis not present

## 2020-05-25 DIAGNOSIS — N1831 Chronic kidney disease, stage 3a: Secondary | ICD-10-CM | POA: Diagnosis present

## 2020-05-25 DIAGNOSIS — R000 Tachycardia, unspecified: Secondary | ICD-10-CM | POA: Diagnosis not present

## 2020-05-25 DIAGNOSIS — Z833 Family history of diabetes mellitus: Secondary | ICD-10-CM

## 2020-05-25 DIAGNOSIS — Z79899 Other long term (current) drug therapy: Secondary | ICD-10-CM

## 2020-05-25 DIAGNOSIS — I491 Atrial premature depolarization: Secondary | ICD-10-CM | POA: Diagnosis not present

## 2020-05-25 DIAGNOSIS — E876 Hypokalemia: Secondary | ICD-10-CM | POA: Diagnosis present

## 2020-05-25 DIAGNOSIS — Z66 Do not resuscitate: Secondary | ICD-10-CM | POA: Diagnosis present

## 2020-05-25 DIAGNOSIS — I700 Atherosclerosis of aorta: Secondary | ICD-10-CM | POA: Diagnosis present

## 2020-05-25 DIAGNOSIS — G9340 Encephalopathy, unspecified: Secondary | ICD-10-CM | POA: Diagnosis present

## 2020-05-25 DIAGNOSIS — E785 Hyperlipidemia, unspecified: Secondary | ICD-10-CM | POA: Diagnosis present

## 2020-05-25 DIAGNOSIS — S8991XA Unspecified injury of right lower leg, initial encounter: Secondary | ICD-10-CM | POA: Diagnosis not present

## 2020-05-25 DIAGNOSIS — Z856 Personal history of leukemia: Secondary | ICD-10-CM

## 2020-05-25 DIAGNOSIS — N183 Chronic kidney disease, stage 3 unspecified: Secondary | ICD-10-CM | POA: Diagnosis present

## 2020-05-25 DIAGNOSIS — S0990XA Unspecified injury of head, initial encounter: Secondary | ICD-10-CM | POA: Diagnosis not present

## 2020-05-25 DIAGNOSIS — F0390 Unspecified dementia without behavioral disturbance: Secondary | ICD-10-CM | POA: Diagnosis present

## 2020-05-25 DIAGNOSIS — N390 Urinary tract infection, site not specified: Secondary | ICD-10-CM | POA: Diagnosis present

## 2020-05-25 DIAGNOSIS — R404 Transient alteration of awareness: Secondary | ICD-10-CM | POA: Diagnosis not present

## 2020-05-25 DIAGNOSIS — Z79890 Hormone replacement therapy (postmenopausal): Secondary | ICD-10-CM

## 2020-05-25 DIAGNOSIS — K219 Gastro-esophageal reflux disease without esophagitis: Secondary | ICD-10-CM | POA: Diagnosis present

## 2020-05-25 DIAGNOSIS — Z7982 Long term (current) use of aspirin: Secondary | ICD-10-CM

## 2020-05-25 LAB — CBC WITH DIFFERENTIAL/PLATELET
Abs Immature Granulocytes: 0.03 10*3/uL (ref 0.00–0.07)
Basophils Absolute: 0 10*3/uL (ref 0.0–0.1)
Basophils Relative: 0 %
Eosinophils Absolute: 0 10*3/uL (ref 0.0–0.5)
Eosinophils Relative: 0 %
HCT: 37.6 % (ref 36.0–46.0)
Hemoglobin: 11.8 g/dL — ABNORMAL LOW (ref 12.0–15.0)
Immature Granulocytes: 0 %
Lymphocytes Relative: 18 %
Lymphs Abs: 1.8 10*3/uL (ref 0.7–4.0)
MCH: 30 pg (ref 26.0–34.0)
MCHC: 31.4 g/dL (ref 30.0–36.0)
MCV: 95.7 fL (ref 80.0–100.0)
Monocytes Absolute: 0.8 10*3/uL (ref 0.1–1.0)
Monocytes Relative: 8 %
Neutro Abs: 7.6 10*3/uL (ref 1.7–7.7)
Neutrophils Relative %: 74 %
Platelets: 212 10*3/uL (ref 150–400)
RBC: 3.93 MIL/uL (ref 3.87–5.11)
RDW: 13.4 % (ref 11.5–15.5)
WBC: 10.3 10*3/uL (ref 4.0–10.5)
nRBC: 0 % (ref 0.0–0.2)

## 2020-05-25 LAB — LACTIC ACID, PLASMA
Lactic Acid, Venous: 2.3 mmol/L (ref 0.5–1.9)
Lactic Acid, Venous: 2.1 mmol/L (ref 0.5–1.9)

## 2020-05-25 LAB — TROPONIN I (HIGH SENSITIVITY)
Troponin I (High Sensitivity): 19 ng/L — ABNORMAL HIGH (ref <18)
Troponin I (High Sensitivity): 20 ng/L — ABNORMAL HIGH (ref <18)

## 2020-05-25 LAB — COMPREHENSIVE METABOLIC PANEL
ALT: 19 U/L (ref 0–44)
AST: 44 U/L — ABNORMAL HIGH (ref 15–41)
Albumin: 3.2 g/dL — ABNORMAL LOW (ref 3.5–5.0)
Alkaline Phosphatase: 60 U/L (ref 38–126)
Anion gap: 16 — ABNORMAL HIGH (ref 5–15)
BUN: 15 mg/dL (ref 8–23)
CO2: 18 mmol/L — ABNORMAL LOW (ref 22–32)
Calcium: 9.3 mg/dL (ref 8.9–10.3)
Chloride: 106 mmol/L (ref 98–111)
Creatinine, Ser: 1.05 mg/dL — ABNORMAL HIGH (ref 0.44–1.00)
GFR, Estimated: 50 mL/min — ABNORMAL LOW (ref >60)
Glucose, Bld: 103 mg/dL — ABNORMAL HIGH (ref 70–99)
Potassium: 4.4 mmol/L (ref 3.5–5.1)
Sodium: 140 mmol/L (ref 135–145)
Total Bilirubin: 1.3 mg/dL — ABNORMAL HIGH (ref 0.3–1.2)
Total Protein: 7.4 g/dL (ref 6.5–8.1)

## 2020-05-25 LAB — MAGNESIUM: Magnesium: 1.2 mg/dL — ABNORMAL LOW (ref 1.7–2.4)

## 2020-05-25 LAB — RESP PANEL BY RT-PCR (FLU A&B, COVID) ARPGX2
Influenza A by PCR: NEGATIVE
Influenza B by PCR: NEGATIVE
SARS Coronavirus 2 by RT PCR: NEGATIVE

## 2020-05-25 LAB — PROTIME-INR
INR: 1.3 — ABNORMAL HIGH (ref 0.8–1.2)
Prothrombin Time: 15.5 seconds — ABNORMAL HIGH (ref 11.4–15.2)

## 2020-05-25 LAB — CK: Total CK: 808 U/L — ABNORMAL HIGH (ref 38–234)

## 2020-05-25 LAB — APTT: aPTT: 29 seconds (ref 24–36)

## 2020-05-25 MED ORDER — ACETAMINOPHEN 650 MG RE SUPP: 650.0000 mg | Freq: Four times a day (QID) | RECTAL | Status: DC | PRN

## 2020-05-25 MED ORDER — ACETAMINOPHEN 325 MG PO TABS: 650.0000 mg | ORAL_TABLET | Freq: Four times a day (QID) | ORAL | Status: DC | PRN

## 2020-05-25 MED ORDER — SODIUM CHLORIDE 0.9 % IV BOLUS
1000.0000 mL | Freq: Once | INTRAVENOUS | Status: AC
  Administered 2020-05-25: 1000 mL via INTRAVENOUS

## 2020-05-25 MED ORDER — ASPIRIN 81 MG PO CHEW
81.0000 mg | CHEWABLE_TABLET | Freq: Every day | ORAL | Status: DC
  Administered 2020-05-25 – 2020-05-29 (×5): 81 mg via ORAL
  Filled 2020-05-25 (×5): qty 1

## 2020-05-25 MED ORDER — ATORVASTATIN CALCIUM 80 MG PO TABS
80.0000 mg | ORAL_TABLET | Freq: Every day | ORAL | Status: DC
  Administered 2020-05-26 – 2020-05-29 (×4): 80 mg via ORAL
  Filled 2020-05-25 (×4): qty 1

## 2020-05-25 MED ORDER — ENOXAPARIN SODIUM 40 MG/0.4ML ~~LOC~~ SOLN
40.0000 mg | SUBCUTANEOUS | Status: DC
  Administered 2020-05-25 – 2020-05-28 (×4): 40 mg via SUBCUTANEOUS
  Filled 2020-05-25 (×4): qty 0.4

## 2020-05-25 MED ORDER — LEVOTHYROXINE SODIUM 75 MCG PO TABS: 37.5000 ug | ORAL_TABLET | ORAL | Status: DC

## 2020-05-25 MED ORDER — MAGNESIUM SULFATE 2 GM/50ML IV SOLN
2.0000 g | Freq: Once | INTRAVENOUS | Status: AC
  Administered 2020-05-25: 2 g via INTRAVENOUS
  Filled 2020-05-25: qty 50

## 2020-05-25 MED ORDER — LEVOTHYROXINE SODIUM 75 MCG PO TABS
75.0000 ug | ORAL_TABLET | ORAL | Status: DC
  Administered 2020-05-26 – 2020-05-29 (×2): 75 ug via ORAL
  Filled 2020-05-25 (×2): qty 1

## 2020-05-25 MED ORDER — POTASSIUM CHLORIDE CRYS ER 20 MEQ PO TBCR
40.0000 meq | EXTENDED_RELEASE_TABLET | Freq: Once | ORAL | Status: AC
  Administered 2020-05-25: 40 meq via ORAL
  Filled 2020-05-25: qty 2

## 2020-05-25 MED ORDER — SODIUM CHLORIDE 0.9 % IV SOLN: INTRAVENOUS | Status: AC

## 2020-05-25 MED ORDER — IOHEXOL 350 MG/ML SOLN
75.0000 mL | Freq: Once | INTRAVENOUS | Status: AC | PRN
  Administered 2020-05-25: 75 mL via INTRAVENOUS

## 2020-05-25 MED ORDER — PANTOPRAZOLE SODIUM 40 MG PO TBEC
40.0000 mg | DELAYED_RELEASE_TABLET | Freq: Every day | ORAL | Status: DC
  Administered 2020-05-26 – 2020-05-29 (×4): 40 mg via ORAL
  Filled 2020-05-25 (×4): qty 1

## 2020-05-25 MED ORDER — SODIUM CHLORIDE 0.9% FLUSH
3.0000 mL | Freq: Two times a day (BID) | INTRAVENOUS | Status: DC
  Administered 2020-05-26 – 2020-05-29 (×6): 3 mL via INTRAVENOUS

## 2020-05-25 NOTE — ED Notes (Signed)
Back to room at this time.

## 2020-05-25 NOTE — ED Notes (Signed)
Patient transported to CT 

## 2020-05-25 NOTE — ED Notes (Signed)
hospitalist at bedside

## 2020-05-25 NOTE — ED Provider Notes (Signed)
[facility] EMERGENCY DEPARTMENT Provider Note   CSN: [id number] Arrival date & time: 05/25/20  1700     History Chief Complaint  Patient presents with  . Altered Mental Status    Donna Burch is a 86 y.o. female.  The history is provided by the patient, the EMS personnel and medical records.  Altered Mental Status  Donna Burch is a 86 y.o. female who presents to the Emergency Department complaining of altered mental status. Level V caveat due to confusion. She presents the emergency department by EMS after being found underground from home. She lives at home alone and family last checked on her last evening around 5:30 PM and she is at her baseline. This evening when they came to check on her at 4 PM she was found facedown on the ground. She does not recall what happened. Per report she has not been taking her home medications for the last week due to the family member that gives them to her being out of town. She denies any complaints on ED presentation. At baseline she is oriented to self in place but is forgetful about the time of year. She is able to perform her ADLs at baseline. She denies any pain.    Past Medical History:  Diagnosis Date  . Arthritis    "right middle finger; hands" (01/03/2015)  . CLL (chronic lymphocytic leukemia) ([place])   . Dyspnea on exertion    Improved with exercise  . Elevated brain natriuretic peptide (BNP) level    Slight  . Family history of adverse reaction to anesthesia    son, Marita Snellen, w/PONV  . Frequent UTI    "used to; none lately" (01/03/2015)  . Gastroenteritis 09/2010   c. dificile.  hospitalized in ICU x 3 days  . GERD (gastroesophageal reflux disease)   . Hiatal hernia   . History of Clostridium difficile ?03/2012   "after taking ATB for one of my UTI's"  . Hyperlipidemia   . Hypertension   . Hypothyroidism   . Nephrolithiasis   . Nocturia    x3  . Pneumonia    Required hospital stay at the time  . PONV  (postoperative nausea and vomiting)   . Renal insufficiency   . Seasonal allergies   . Skin cancer    right anterior neck  . Wears glasses     Patient Active Problem List   Diagnosis Date Noted  . Acute encephalopathy 05/25/2020  . Atherosclerosis of aorta ([place]) 08/24/2018  . Chronic kidney disease, stage III (moderate) ([place]) 08/24/2018  . Hearing loss of both ears 08/24/2018  . Nontoxic goiter 08/24/2018  . UTI (urinary tract infection) 03/15/2018  . Community acquired pneumonia 03/15/2018  . Syncope and collapse 03/15/2018  . Primary osteoarthritis of both hands 01/09/2018  . Common bile duct calculi   . Gallstone pancreatitis 10/16/2017  . Pancreatitis 10/15/2017  . Inflammatory arthritis 05/29/2016  . Pain in joint involving multiple sites 05/29/2016  . Screening-pulmonary TB 05/29/2016  . High risk medication use 04/25/2016  . Bradycardia   . Symptomatic bradycardia   . Junctional escape rhythm   . Orthostatic hypotension   . Thyroid activity decreased   . Hypomagnesemia   . Syncope 09/01/2015  . Nausea & vomiting 09/01/2015  . Hyponatremia 09/01/2015  . Cough 09/01/2015  . Hypertension   . Hyperlipidemia   . GERD (gastroesophageal reflux disease)   . Hypothyroidism   . Gastroesophageal reflux disease without esophagitis   . CLL (  chronic lymphocytic leukemia) ([place]) 07/14/2015  . Thyroid nodule, right 01/03/2015  . Thyroid nodule 01/03/2015  . Pelvic relaxation 04/08/2012  . Kidney stones 04/08/2012  . Recurrent UTI (urinary tract infection) 04/08/2012    Past Surgical History:  Procedure Laterality Date  . CATARACT EXTRACTION, BILATERAL  2010  . CHOLECYSTECTOMY N/A 10/17/2017   Procedure: LAPAROSCOPIC CHOLECYSTECTOMY WITH INTRAOPERATIVE CHOLANGIOGRAM;  Surgeon: Clovis Riley, MD;  Location: [facility];  Service: General;  Laterality: N/A;  . COLONOSCOPY    . CYSTOSCOPY W/ STONE MANIPULATION Right   . CYSTOSCOPY WITH STENT PLACEMENT    . ERCP N/A 10/18/2017     Procedure: ENDOSCOPIC RETROGRADE CHOLANGIOPANCREATOGRAPHY (ERCP);  Surgeon: Ronnette Juniper, MD;  Location: [facility];  Service: Gastroenterology;  Laterality: N/A;  . ESOPHAGOGASTRODUODENOSCOPY    . ESOPHAGOGASTRODUODENOSCOPY (EGD) WITH ESOPHAGEAL DILATION    . KNEE ARTHROSCOPY Left   . LITHOTRIPSY     "didn't work"  . SKIN CANCER EXCISION Right    anterior neck  . THYROID LOBECTOMY Right 01/03/2015  . THYROID LOBECTOMY Right 01/03/2015   Procedure: RIGHT THYROID LOBECTOMY;  Surgeon: Armandina Gemma, MD;  Location: [facility];  Service: General;  Laterality: Right;  . TONSILLECTOMY    . WISDOM TOOTH EXTRACTION       OB History    Gravida  3   Para  3   Term      Preterm      AB      Living  3     SAB      TAB      Ectopic      Multiple      Live Births              Family History  Problem Relation Age of Onset  . Heart attack Mother   . Hyperlipidemia Mother   . Asthma Father   . Diabetes Father     Social History   Tobacco Use  . Smoking status: Never Smoker  . Smokeless tobacco: Never Used  Vaping Use  . Vaping Use: Never used  Substance Use Topics  . Alcohol use: No  . Drug use: Never    Home Medications Prior to Admission medications   Medication Sig Start Date End Date Taking? Authorizing Provider  aspirin 81 MG chewable tablet Take 81 mg by mouth daily.   Yes [provider]  atorvastatin (LIPITOR) 80 MG tablet Take 80 mg by mouth daily.  07/01/18  Yes [provider]  B Complex-C (SUPER B COMPLEX PO) Take 1 tablet by mouth daily.   Yes [provider]  cholecalciferol (VITAMIN D) 1000 units tablet Take 2 tablets (2,000 Units total) by mouth daily. 03/17/18  Yes Florencia Reasons, MD  ferrous sulfate 325 (65 FE) MG EC tablet Take 1 tablet (325 mg total) by mouth daily with breakfast. 03/17/18  Yes Florencia Reasons, MD  fluticasone [facility]) 50 MCG/ACT nasal spray Place 1 spray into both nostrils daily. Patient taking differently: Place 1  spray into both nostrils daily as needed for allergies.  03/17/18  Yes Florencia Reasons, MD  guaiFENesin (MUCINEX) 600 MG 12 hr tablet Take 1 tablet (600 mg total) by mouth 2 (two) times daily. 03/17/18  Yes Florencia Reasons, MD  Javier Docker Oil (OMEGA-3) 500 MG CAPS Take 500 mg by mouth daily.   Yes [provider]  levothyroxine (SYNTHROID, LEVOTHROID) 75 MCG tablet Take 37.5-75 mcg by mouth See admin instructions. Take 1 tablet (75 mcg) by mouth on Monday  thru Friday mornings before breakfast and take 1/2 tablet (37.5 mcg) on Saturday and Sunday before breakfast   Yes [provider]  losartan (COZAAR) 100 MG tablet Take 100 mg by mouth daily.   Yes [provider]  magnesium gluconate (MAGONATE) 30 MG tablet Take 1 tablet (30 mg total) by mouth daily. Patient taking differently: Take 30 mg by mouth 2 (two) times daily. For 2 weeks pt started this regimen 05/22/2020 03/17/18  Yes Florencia Reasons, MD  mirtazapine (REMERON) 15 MG tablet Take 15 mg by mouth at bedtime. 03/20/20  Yes [provider]  Multiple Vitamins-Minerals (ONE-A-DAY WOMENS 50 PLUS PO) Take 1 tablet by mouth daily.   Yes [provider]  omeprazole (PRILOSEC) 40 MG capsule Take 40 mg by mouth daily.    Yes [provider]  Probiotic Product (Kenvir) CAPS Take 1 capsule by mouth daily.   Yes [provider]    Allergies    Prednisone, Zoledronic acid, Hydrochlorothiazide, and Lisinopril  Review of Systems   Review of Systems  All other systems reviewed and are negative.   Physical Exam Updated Vital Signs BP (!) 146/90   Pulse (!) 120   Temp 98.2 F (36.8 C) (Oral)   Resp (!) 25   Ht 5\' 4"  (1.626 m)   Wt 54 kg   SpO2 97%   BMI 20.43 kg/m   Physical Exam Vitals and nursing note reviewed.  Constitutional:      Appearance: She is well-developed.  HENT:     Head: Normocephalic and atraumatic.     Comments: Dry mucous membranes Cardiovascular:     Rate and Rhythm:  Regular rhythm. Tachycardia present.     Heart sounds: No murmur heard.   Pulmonary:     Effort: Pulmonary effort is normal. No respiratory distress.     Breath sounds: Normal breath sounds.  Abdominal:     Palpations: Abdomen is soft.     Tenderness: There is no abdominal tenderness. There is no guarding or rebound.  Musculoskeletal:        General: No swelling or tenderness.     Comments: Ecchymosis over the right hip and right knee with no focal tenderness.  Skin:    General: Skin is warm and dry.  Neurological:     Mental Status: She is alert.     Comments: Disoriented to place in time and recent events. Oriented to person. Generalized weakness. Has difficulty following commands.  Psychiatric:        Behavior: Behavior normal.     ED Results / Procedures / Treatments   Labs (all labs ordered are listed, but only abnormal results are displayed) Labs Reviewed  LACTIC ACID, PLASMA - Abnormal; Notable for the following components:      Result Value   Lactic Acid, Venous 2.3 (*)    All other components within normal limits  LACTIC ACID, PLASMA - Abnormal; Notable for the following components:   Lactic Acid, Venous 2.1 (*)    All other components within normal limits  COMPREHENSIVE METABOLIC PANEL - Abnormal; Notable for the following components:   CO2 18 (*)    Glucose, Bld 103 (*)    Creatinine, Ser 1.05 (*)    Albumin 3.2 (*)    AST 44 (*)    Total Bilirubin 1.3 (*)    GFR, Estimated 50 (*)    Anion gap 16 (*)    All other components within normal limits  CBC WITH DIFFERENTIAL/PLATELET -  Abnormal; Notable for the following components:   Hemoglobin 11.8 (*)    All other components within normal limits  CK - Abnormal; Notable for the following components:   Total CK 808 (*)    All other components within normal limits  MAGNESIUM - Abnormal; Notable for the following components:   Magnesium 1.2 (*)    All other components within normal limits  PROTIME-INR - Abnormal;  Notable for the following components:   Prothrombin Time 15.5 (*)    INR 1.3 (*)    All other components within normal limits  TROPONIN I (HIGH SENSITIVITY) - Abnormal; Notable for the following components:   Troponin I (High Sensitivity) 19 (*)    All other components within normal limits  TROPONIN I (HIGH SENSITIVITY) - Abnormal; Notable for the following components:   Troponin I (High Sensitivity) 20 (*)    All other components within normal limits  RESP PANEL BY RT-PCR (FLU A&B, COVID) [id number]  CULTURE, BLOOD (SINGLE)  URINE CULTURE  APTT  URINALYSIS, ROUTINE W REFLEX MICROSCOPIC  TSH  COMPREHENSIVE METABOLIC PANEL  CBC  PROTIME-INR  MAGNESIUM    EKG EKG Interpretation  Date/Time:  Thursday May 25 2020 17:08:40 EST Ventricular Rate:  93 PR Interval:    QRS Duration: 74 QT Interval:  355 QTC Calculation: 442 R Axis:   50 Text Interpretation: Sinus tachycardia Paired ventricular premature complexes Prominent P waves, nondiagnostic Borderline low voltage, extremity leads Probable anteroseptal infarct, old Borderline ST elevation, lateral leads Artifact in lead(s) I II III aVR aVL aVF V2 Confirmed by Quintella Reichert [phone]) on 05/25/2020 5:13:11 PM   Radiology CT Angio Head W or Wo Contrast  Result Date: 05/25/2020 CLINICAL DATA:  Found down EXAM: CT ANGIOGRAPHY HEAD AND NECK TECHNIQUE: Multidetector CT imaging of the head and neck was performed using the standard protocol during bolus administration of intravenous contrast. Multiplanar CT image reconstructions and MIPs were obtained to evaluate the vascular anatomy. Carotid stenosis measurements (when applicable) are obtained utilizing NASCET criteria, using the distal internal carotid diameter as the denominator. CONTRAST:  5mL OMNIPAQUE IOHEXOL 350 MG/ML SOLN COMPARISON:  None. FINDINGS: CTA NECK FINDINGS SKELETON: There is no bony spinal Burch stenosis. No lytic or blastic lesion. OTHER NECK: Normal pharynx, larynx and  major salivary glands. No cervical lymphadenopathy. Unremarkable thyroid gland. UPPER CHEST: No pneumothorax or pleural effusion. No nodules or masses. AORTIC ARCH: There is calcific atherosclerosis of the aortic arch. There is no aneurysm, dissection or hemodynamically significant stenosis of the visualized portion of the aorta. Conventional 3 vessel aortic branching pattern. The visualized proximal subclavian arteries are widely patent. RIGHT CAROTID SYSTEM: No dissection, occlusion or aneurysm. Mild atherosclerotic calcification at the carotid bifurcation without hemodynamically significant stenosis. LEFT CAROTID SYSTEM: No dissection, occlusion or aneurysm. Mild atherosclerotic calcification at the carotid bifurcation without hemodynamically significant stenosis. VERTEBRAL ARTERIES: Left dominant configuration. Both origins are clearly patent. There is no dissection, occlusion or flow-limiting stenosis to the skull base (V1-V3 segments). CTA HEAD FINDINGS POSTERIOR CIRCULATION: --Vertebral arteries: Normal V4 segments. --Inferior cerebellar arteries: Normal. --Basilar artery: Normal. --Superior cerebellar arteries: Normal. --Posterior cerebral arteries (PCA): Normal. ANTERIOR CIRCULATION: --Intracranial internal carotid arteries: Normal. --Anterior cerebral arteries (ACA): Normal. Both A1 segments are present. Patent anterior communicating artery (a-comm). --Middle cerebral arteries (MCA): Normal. VENOUS SINUSES: As permitted by contrast timing, patent. ANATOMIC VARIANTS: None Review of the MIP images confirms the above findings. IMPRESSION: 1. No emergent large vessel occlusion or hemodynamically significant stenosis of the head or neck.  Aortic Atherosclerosis (ICD10-I70.0). Electronically Signed   By: Ulyses Jarred M.D.   On: 05/25/2020 21:03   CT Head Wo Contrast  Result Date: 05/25/2020 CLINICAL DATA:  Mental status change, unknown cause. Neck trauma. Additional history provided: Patient found down  today. EXAM: CT HEAD WITHOUT CONTRAST CT CERVICAL SPINE WITHOUT CONTRAST TECHNIQUE: Multidetector CT imaging of the head and cervical spine was performed following the standard protocol without intravenous contrast. Multiplanar CT image reconstructions of the cervical spine were also generated. COMPARISON:  CT head/cervical spine 03/15/2018. FINDINGS: CT HEAD FINDINGS Brain: Mild generalized cerebral atrophy. Mild ill-defined hypoattenuation within the cerebral white matter is nonspecific, but compatible with chronic small vessel ischemic disease. There is no acute intracranial hemorrhage. No demarcated cortical infarct. No extra-axial fluid collection. No evidence of intracranial mass. No midline shift. Vascular: Apparent subtle asymmetric density of the left ICA terminus and M1 left MCA (for instance as seen on series 3, images 14 and 15). Skull: Normal. Negative for fracture or focal lesion. Sinuses/Orbits: Visualized orbits show no acute finding. Mild ethmoid and maxillary sinus mucosal thickening. Other: Secretions within the nasopharynx. CT CERVICAL SPINE FINDINGS Alignment: Trace grade 1 anterolisthesis at C5-C6 and T1-T2, unchanged. Skull base and vertebrae: The basion-dental and atlanto-dental intervals are maintained.No evidence of acute fracture to the cervical spine. Soft tissues and spinal Burch: No prevertebral fluid or swelling. No visible Burch hematoma. Disc levels: Cervical spondylosis with multilevel disc space narrowing, disc bulges, posterior disc osteophytes, uncovertebral hypertrophy and facet arthrosis. Disc space narrowing is moderate/severe at C4-C5 and C6-C7. Upper chest: No consolidation within the imaged lung apices. No visible pneumothorax. Impression #1 below was called by telephone at the time of interpretation on 05/25/2020 at 6:43 pm to provider [facility] , who verbally acknowledged these results. IMPRESSION: CT head: 1. Apparent asymmetric density of the left ICA terminus and  M1 left MCA. This may be related to streak artifact in this region. However, consider CT angiography to exclude thrombus within these vessels if there is clinical concern for an acute large vessel occlusion. 2. Otherwise, there is no evidence of acute intracranial abnormality. 3. Mild cerebral atrophy and chronic small vessel ischemic disease. 4. Mild ethmoid and maxillary sinus mucosal thickening. CT cervical spine: 1. No evidence of acute fracture to the cervical spine. 2. Mild C5-C6 and T1-T2 grade 1 anterolisthesis, unchanged. 3. Cervical spondylosis as described. Electronically Signed   By: Kellie Simmering DO   On: 05/25/2020 18:44   CT Angio Neck W and/or Wo Contrast  Result Date: 05/25/2020 CLINICAL DATA:  Found down EXAM: CT ANGIOGRAPHY HEAD AND NECK TECHNIQUE: Multidetector CT imaging of the head and neck was performed using the standard protocol during bolus administration of intravenous contrast. Multiplanar CT image reconstructions and MIPs were obtained to evaluate the vascular anatomy. Carotid stenosis measurements (when applicable) are obtained utilizing NASCET criteria, using the distal internal carotid diameter as the denominator. CONTRAST:  48mL OMNIPAQUE IOHEXOL 350 MG/ML SOLN COMPARISON:  None. FINDINGS: CTA NECK FINDINGS SKELETON: There is no bony spinal Burch stenosis. No lytic or blastic lesion. OTHER NECK: Normal pharynx, larynx and major salivary glands. No cervical lymphadenopathy. Unremarkable thyroid gland. UPPER CHEST: No pneumothorax or pleural effusion. No nodules or masses. AORTIC ARCH: There is calcific atherosclerosis of the aortic arch. There is no aneurysm, dissection or hemodynamically significant stenosis of the visualized portion of the aorta. Conventional 3 vessel aortic branching pattern. The visualized proximal subclavian arteries are widely patent. RIGHT CAROTID SYSTEM: No dissection, occlusion  or aneurysm. Mild atherosclerotic calcification at the carotid bifurcation  without hemodynamically significant stenosis. LEFT CAROTID SYSTEM: No dissection, occlusion or aneurysm. Mild atherosclerotic calcification at the carotid bifurcation without hemodynamically significant stenosis. VERTEBRAL ARTERIES: Left dominant configuration. Both origins are clearly patent. There is no dissection, occlusion or flow-limiting stenosis to the skull base (V1-V3 segments). CTA HEAD FINDINGS POSTERIOR CIRCULATION: --Vertebral arteries: Normal V4 segments. --Inferior cerebellar arteries: Normal. --Basilar artery: Normal. --Superior cerebellar arteries: Normal. --Posterior cerebral arteries (PCA): Normal. ANTERIOR CIRCULATION: --Intracranial internal carotid arteries: Normal. --Anterior cerebral arteries (ACA): Normal. Both A1 segments are present. Patent anterior communicating artery (a-comm). --Middle cerebral arteries (MCA): Normal. VENOUS SINUSES: As permitted by contrast timing, patent. ANATOMIC VARIANTS: None Review of the MIP images confirms the above findings. IMPRESSION: 1. No emergent large vessel occlusion or hemodynamically significant stenosis of the head or neck. Aortic Atherosclerosis (ICD10-I70.0). Electronically Signed   By: Ulyses Jarred M.D.   On: 05/25/2020 21:03   CT Cervical Spine Wo Contrast  Result Date: 05/25/2020 CLINICAL DATA:  Mental status change, unknown cause. Neck trauma. Additional history provided: Patient found down today. EXAM: CT HEAD WITHOUT CONTRAST CT CERVICAL SPINE WITHOUT CONTRAST TECHNIQUE: Multidetector CT imaging of the head and cervical spine was performed following the standard protocol without intravenous contrast. Multiplanar CT image reconstructions of the cervical spine were also generated. COMPARISON:  CT head/cervical spine 03/15/2018. FINDINGS: CT HEAD FINDINGS Brain: Mild generalized cerebral atrophy. Mild ill-defined hypoattenuation within the cerebral white matter is nonspecific, but compatible with chronic small vessel ischemic disease. There  is no acute intracranial hemorrhage. No demarcated cortical infarct. No extra-axial fluid collection. No evidence of intracranial mass. No midline shift. Vascular: Apparent subtle asymmetric density of the left ICA terminus and M1 left MCA (for instance as seen on series 3, images 14 and 15). Skull: Normal. Negative for fracture or focal lesion. Sinuses/Orbits: Visualized orbits show no acute finding. Mild ethmoid and maxillary sinus mucosal thickening. Other: Secretions within the nasopharynx. CT CERVICAL SPINE FINDINGS Alignment: Trace grade 1 anterolisthesis at C5-C6 and T1-T2, unchanged. Skull base and vertebrae: The basion-dental and atlanto-dental intervals are maintained.No evidence of acute fracture to the cervical spine. Soft tissues and spinal Burch: No prevertebral fluid or swelling. No visible Burch hematoma. Disc levels: Cervical spondylosis with multilevel disc space narrowing, disc bulges, posterior disc osteophytes, uncovertebral hypertrophy and facet arthrosis. Disc space narrowing is moderate/severe at C4-C5 and C6-C7. Upper chest: No consolidation within the imaged lung apices. No visible pneumothorax. Impression #1 below was called by telephone at the time of interpretation on 05/25/2020 at 6:43 pm to provider [facility] , who verbally acknowledged these results. IMPRESSION: CT head: 1. Apparent asymmetric density of the left ICA terminus and M1 left MCA. This may be related to streak artifact in this region. However, consider CT angiography to exclude thrombus within these vessels if there is clinical concern for an acute large vessel occlusion. 2. Otherwise, there is no evidence of acute intracranial abnormality. 3. Mild cerebral atrophy and chronic small vessel ischemic disease. 4. Mild ethmoid and maxillary sinus mucosal thickening. CT cervical spine: 1. No evidence of acute fracture to the cervical spine. 2. Mild C5-C6 and T1-T2 grade 1 anterolisthesis, unchanged. 3. Cervical spondylosis  as described. Electronically Signed   By: Kellie Simmering DO   On: 05/25/2020 18:44   DG Chest Port 1 View  Result Date: 05/25/2020 CLINICAL DATA:  Found down EXAM: PORTABLE CHEST 1 VIEW COMPARISON:  04/22/2018 FINDINGS: The heart size and mediastinal  contours are within normal limits. Both lungs are clear. The visualized skeletal structures are unremarkable. IMPRESSION: No active disease. Electronically Signed   By: Randa Ngo M.D.   On: 05/25/2020 19:42   DG Knee Complete 4 Views Right  Result Date: 05/25/2020 CLINICAL DATA:  Found down EXAM: RIGHT KNEE - COMPLETE 4+ VIEW COMPARISON:  None. FINDINGS: Frontal, bilateral oblique, and cross-table lateral views of the right knee are obtained. No fracture, subluxation, or dislocation. Three compartmental osteoarthritis greatest in the medial and patellofemoral compartments. No joint effusion. Soft tissues are unremarkable. IMPRESSION: 1. Osteoarthritis.  No acute fracture. Electronically Signed   By: Randa Ngo M.D.   On: 05/25/2020 19:47   DG Hip Unilat W or Wo Pelvis 2-3 Views Right  Result Date: 05/25/2020 CLINICAL DATA:  Sepsis, found down EXAM: DG HIP (WITH OR WITHOUT PELVIS) 2-3V RIGHT COMPARISON:  None. FINDINGS: View of the pelvis as well as frontal and frogleg lateral views of the right hip are obtained. No fracture, subluxation, or dislocation. Symmetrical bilateral hip osteoarthritis. Sacroiliac joints are normal. IMPRESSION: 1. Osteoarthritis.  No acute fracture. Electronically Signed   By: Randa Ngo M.D.   On: 05/25/2020 19:40    Procedures Procedures (including critical care time)  Medications Ordered in ED Medications  aspirin chewable tablet 81 mg (81 mg Oral Given 05/25/20 2155)  atorvastatin (LIPITOR) tablet 80 mg (has no administration in time range)  levothyroxine (SYNTHROID) tablet 75 mcg (has no administration in time range)  enoxaparin (LOVENOX) injection 40 mg (40 mg Subcutaneous Given 05/25/20 2151)  sodium  chloride flush (NS) 0.9 % injection 3 mL (3 mLs Intravenous Not Given 05/25/20 2152)  acetaminophen (TYLENOL) tablet 650 mg (has no administration in time range)    Or  acetaminophen (TYLENOL) suppository 650 mg (has no administration in time range)  0.9 %  sodium chloride infusion ( Intravenous New Bag/Given 05/25/20 2148)  pantoprazole (PROTONIX) EC tablet 40 mg (has no administration in time range)  sodium chloride 0.9 % bolus 1,000 mL (0 mLs Intravenous Stopped 05/25/20 2140)  iohexol (OMNIPAQUE) 350 MG/ML injection 75 mL (75 mLs Intravenous Contrast Given 05/25/20 2032)  sodium chloride 0.9 % bolus 1,000 mL (1,000 mLs Intravenous New Bag/Given 05/25/20 2148)  potassium chloride SA (KLOR-CON) CR tablet 40 mEq (40 mEq Oral Given 05/25/20 2154)  magnesium sulfate IVPB 2 g 50 mL (0 g Intravenous Stopped 05/25/20 2300)    ED Course  I have reviewed the triage vital signs and the nursing notes.  Pertinent labs & imaging results that were available during my care of the patient were reviewed by me and considered in my medical decision making (see chart for details).    MDM Rules/Calculators/A&P                         patient here for evaluation of altered mental status after being found down. She is dehydrated appearing on evaluation with multiple contusions. No evidence of acute fracture. Creatinine is near her baseline. Given her altered mental status plan to admit for ongoing treatment. CT scan with possible dense MCA. Patient is confused with generalized weakness. She is out of the window for any intervention and is LPO negative based on examination. Given the CT scan findings will obtain CTA. Patient and son updated of findings of studies recommendation for mission and they are in agreement with plan. Hospitalist consulted for admission.  Final Clinical Impression(s) / ED Diagnoses Final diagnoses:  Disorientation  Fall, initial encounter    Rx / DC Orders ED Discharge Orders    None         Quintella Reichert, MD 05/25/20 2339

## 2020-05-25 NOTE — ED Notes (Signed)
Pt has erythematous areas to knees bilat and right forearm. Pt has skin abrasion to right knee and it has 1+ swelling.

## 2020-05-25 NOTE — ED Notes (Addendum)
Pt unable to stand to complete orthostatic VS.

## 2020-05-25 NOTE — ED Triage Notes (Addendum)
Pt arrived via [facility] from home for AMS. Pt LKW 05/24/2020 at 1800. Family came to check on pt today at 1600 and found her face down on the ground, covered in urine. Pt is A&Ox1 to self only. EMS gave NS 500cc.

## 2020-05-25 NOTE — H&P (Signed)
History and Physical   Donna Burch ENI:[id number] DOB: 03/02/1927 DOA: 05/25/2020  PCP: Harlan Stains, MD   Patient coming from: Home  Chief Complaint: AMS  HPI: Donna Burch is a 86 y.o. female with medical history significant of aortic atherosclerosis, bradycardia, CKD 3, CLL, gallstones, GERD, HLD, hypothyroidism, syncope, thyroid nodules who presents after being found down at home with altered mental status.  Patient unable to significantly participate in HPI due to her memory deficits and likely undiagnosed dementia with worsening mental status.  History obtained with assistance of her son and chart review.  She is alert and oriented to self and place at baseline but has issues with her memory including knowing time and sometimes mistaking objects.  She has no memory of the events.  Patient lives alone and is typically able to take care of her ADLs.  She does have help with her medications by her family.  Her family checks on her each afternoon at least and she was her normal self around 5:30 PM yesterday.  Her son checked on her at about 4 PM today and she was found face down.  Of note she had not been out to pick up her morning newspaper which she typically does so she may have been down for some time.  There is concerned that she was not taking her medications for about the past week possibly as she was upset that her daughter who typically manages her medications was out of town on vacation, but unclear why she does not take her medications.  Patient did not have any complaints when she was checked on by her family on the day prior to admission.  ED Course: Vital signs significant for blood pressure in the 614E to 315Q systolic.  BMP showed bicarb of 18 with gap of 16, creatinine stable at 1.05.  LFTs showed albumin of 3.2, AST 41, T bili 1.3.  CBC showed hemoglobin 11.8 which is stable.  CK elevated Adeleke.  PT/INR elevated at 15.5 and 1.3.  PTT normal.  Troponin pending.  Lactic acid  initially elevated at 2.3.  Magnesium low at 1.2.  Urinalysis, urine culture, blood cultures pending.  Respiratory panel for flu and Covid ordered.  Chest x-ray was without acute abnormality, right hip x-ray was without acute abnormalities.  CT head showed possible changes at the ICA terminus and M1 with recommendation to confirm a CTA.  CT C-spine showed no acute abnormalities and stable chronic changes.  Review of Systems: Unable to be completely performed due to patient's encephalopathy.  Past Medical History:  Diagnosis Date  . Arthritis    "right middle finger; hands" (01/03/2015)  . CLL (chronic lymphocytic leukemia) ([place])   . Dyspnea on exertion    Improved with exercise  . Elevated brain natriuretic peptide (BNP) level    Slight  . Family history of adverse reaction to anesthesia    son, Marita Snellen, w/PONV  . Frequent UTI    "used to; none lately" (01/03/2015)  . Gastroenteritis 09/2010   c. dificile.  hospitalized in ICU x 3 days  . GERD (gastroesophageal reflux disease)   . Hiatal hernia   . History of Clostridium difficile ?03/2012   "after taking ATB for one of my UTI's"  . Hyperlipidemia   . Hypertension   . Hypothyroidism   . Nephrolithiasis   . Nocturia    x3  . Pneumonia    Required hospital stay at the time  . PONV (postoperative nausea and vomiting)   .  Renal insufficiency   . Seasonal allergies   . Skin cancer    right anterior neck  . Wears glasses     Past Surgical History:  Procedure Laterality Date  . CATARACT EXTRACTION, BILATERAL  2010  . CHOLECYSTECTOMY N/A 10/17/2017   Procedure: LAPAROSCOPIC CHOLECYSTECTOMY WITH INTRAOPERATIVE CHOLANGIOGRAM;  Surgeon: Clovis Riley, MD;  Location: [facility];  Service: General;  Laterality: N/A;  . COLONOSCOPY    . CYSTOSCOPY W/ STONE MANIPULATION Right   . CYSTOSCOPY WITH STENT PLACEMENT    . ERCP N/A 10/18/2017   Procedure: ENDOSCOPIC RETROGRADE CHOLANGIOPANCREATOGRAPHY (ERCP);  Surgeon: Ronnette Juniper, MD;  Location: [facility];  Service: Gastroenterology;  Laterality: N/A;  . ESOPHAGOGASTRODUODENOSCOPY    . ESOPHAGOGASTRODUODENOSCOPY (EGD) WITH ESOPHAGEAL DILATION    . KNEE ARTHROSCOPY Left   . LITHOTRIPSY     "didn't work"  . SKIN CANCER EXCISION Right    anterior neck  . THYROID LOBECTOMY Right 01/03/2015  . THYROID LOBECTOMY Right 01/03/2015   Procedure: RIGHT THYROID LOBECTOMY;  Surgeon: Armandina Gemma, MD;  Location: [place];  Service: General;  Laterality: Right;  . TONSILLECTOMY    . WISDOM TOOTH EXTRACTION      Social History  reports that she has never smoked. She has never used smokeless tobacco. She reports that she does not drink alcohol and does not use drugs.  Allergies  Allergen Reactions  . Prednisone Other (See Comments)    "Jittery"  . Reclast [Zoledronic Acid] Other (See Comments)    Pt did not have a memory, from the day after she took that medication   . Hydrochlorothiazide Other (See Comments)    Caused low sodium blood levels per [employer]  . Lisinopril Cough    Family History  Problem Relation Age of Onset  . Heart attack Mother   . Hyperlipidemia Mother   . Asthma Father   . Diabetes Father   Reviewed on admission  Prior to Admission medications   Medication Sig Start Date End Date Taking? Authorizing Provider  aspirin 81 MG chewable tablet Take 81 mg by mouth daily.    [provider]  atorvastatin (LIPITOR) 80 MG tablet  07/01/18   [provider]  cholecalciferol (VITAMIN D) 1000 units tablet Take 2 tablets (2,000 Units total) by mouth daily. 03/17/18   Florencia Reasons, MD  ferrous sulfate 325 (65 FE) MG EC tablet Take 1 tablet (325 mg total) by mouth daily with breakfast. 03/17/18   Florencia Reasons, MD  fluticasone [facility]) 50 MCG/ACT nasal spray Place 1 spray into both nostrils daily. 03/17/18   Florencia Reasons, MD  guaiFENesin (MUCINEX) 600 MG 12 hr tablet Take 1 tablet (600 mg total) by mouth 2 (two) times daily. 03/17/18   Florencia Reasons, MD  Javier Docker Oil (OMEGA-3) 500  MG CAPS Take 500 mg by mouth daily.    [provider]  levothyroxine (SYNTHROID, LEVOTHROID) 75 MCG tablet Take 37.5-75 mcg by mouth See admin instructions. Take 1 tablet (75 mcg) by mouth on Monday thru Friday mornings before breakfast and take 1/2 tablet (37.5 mcg) on Saturday and Sunday before breakfast    [provider]  losartan (COZAAR) 100 MG tablet Take 100 mg by mouth daily.    [provider]  magnesium gluconate (MAGONATE) 30 MG tablet Take 1 tablet (30 mg total) by mouth daily. 03/17/18   Florencia Reasons, MD  omeprazole (PRILOSEC) 40 MG capsule Take 40 mg by mouth daily.     [provider]  OVER THE COUNTER MEDICATION Apply 1 application topically 3 (three) times daily as needed (pain). [employer], operational, Historical, MD  Probiotic Product ([place]) CAPS Take 1 capsule by mouth daily.    [provider]  vitamin C (ASCORBIC ACID) 250 MG tablet Take 1 tablet (250 mg total) by mouth daily. 03/17/18   Florencia Reasons, MD  vitamin E 400 UNIT capsule Take 400 Units by mouth daily.    [provider]    Physical Exam: Vitals:   05/25/20 1830 05/25/20 1900 05/25/20 1930 05/25/20 2000  BP: (!) 153/71 (!) 156/97 (!) 153/76 (!) 145/85  Pulse: 70 86 84 80  Resp: 19 (!) 26 20 (!) 24  Temp:      TempSrc:      SpO2: 100% 98% 97% 99%  Weight:      Height:       Physical Exam Constitutional:      General: She is not in acute distress.    Appearance: Normal appearance.     Comments: Pleasantly demented, thin elderly female  HENT:     Head: Normocephalic and atraumatic.     Mouth/Throat:     Mouth: Mucous membranes are moist.     Pharynx: No posterior oropharyngeal erythema.  Eyes:     Extraocular Movements: Extraocular movements intact.     Pupils: Pupils are equal, round, and reactive to light.  Cardiovascular:     Rate and Rhythm: Normal rate and regular rhythm.     Pulses: Normal pulses.      Heart sounds: Normal heart sounds.  Pulmonary:     Effort: Pulmonary effort is normal. No respiratory distress.     Breath sounds: Normal breath sounds.  Abdominal:     General: Bowel sounds are normal. There is no distension.     Palpations: Abdomen is soft.     Tenderness: There is abdominal tenderness (Suprapubic tenderness).  Musculoskeletal:        General: No swelling or deformity.  Skin:    General: Skin is warm and dry.  Neurological:     General: No focal deficit present.     Comments: Alert, oriented to self and city.  Not oriented to building or time.  When asked about recent events she talks about a friend that she had a church.    Labs on Admission: I have personally reviewed following labs and imaging studies  CBC: Recent Labs  Lab 05/25/20 1710  WBC 10.3  NEUTROABS 7.6  HGB 11.8*  HCT 37.6  MCV 95.7  PLT 297    Basic Metabolic Panel: Recent Labs  Lab 05/25/20 1710 05/25/20 1850  NA 140  --   K 4.4  --   CL 106  --   CO2 18*  --   GLUCOSE 103*  --   BUN 15  --   CREATININE 1.05*  --   CALCIUM 9.3  --   MG  --  1.2*    GFR: Estimated Creatinine Clearance: 30.1 mL/min (A) (by C-G formula based on SCr of 1.05 mg/dL (H)).  Liver Function Tests: Recent Labs  Lab 05/25/20 1710  AST 44*  ALT 19  ALKPHOS 60  BILITOT 1.3*  PROT 7.4  ALBUMIN 3.2*    Urine analysis:    Component Value Date/Time   COLORURINE AMBER (A) 03/15/2018 1756   APPEARANCEUR CLOUDY (A) 03/15/2018 1756   LABSPEC 1.010 03/15/2018 1756   PHURINE 8.0 03/15/2018 1756  GLUCOSEU NEGATIVE 03/15/2018 1756   HGBUR SMALL (A) 03/15/2018 1756   BILIRUBINUR NEGATIVE 03/15/2018 1756   BILIRUBINUR neg 04/08/2012 1552   KETONESUR NEGATIVE 03/15/2018 1756   PROTEINUR NEGATIVE 03/15/2018 1756   UROBILINOGEN negative 04/08/2012 1552   UROBILINOGEN 0.2 10/06/2010 2006   NITRITE NEGATIVE 03/15/2018 1756   LEUKOCYTESUR MODERATE (A) 03/15/2018 1756    Radiological Exams on  Admission: CT Angio Head W or Wo Contrast  Result Date: 05/25/2020 CLINICAL DATA:  Found down EXAM: CT ANGIOGRAPHY HEAD AND NECK TECHNIQUE: Multidetector CT imaging of the head and neck was performed using the standard protocol during bolus administration of intravenous contrast. Multiplanar CT image reconstructions and MIPs were obtained to evaluate the vascular anatomy. Carotid stenosis measurements (when applicable) are obtained utilizing NASCET criteria, using the distal internal carotid diameter as the denominator. CONTRAST:  54mL OMNIPAQUE IOHEXOL 350 MG/ML SOLN COMPARISON:  None. FINDINGS: CTA NECK FINDINGS SKELETON: There is no bony spinal canal stenosis. No lytic or blastic lesion. OTHER NECK: Normal pharynx, larynx and major salivary glands. No cervical lymphadenopathy. Unremarkable thyroid gland. UPPER CHEST: No pneumothorax or pleural effusion. No nodules or masses. AORTIC ARCH: There is calcific atherosclerosis of the aortic arch. There is no aneurysm, dissection or hemodynamically significant stenosis of the visualized portion of the aorta. Conventional 3 vessel aortic branching pattern. The visualized proximal subclavian arteries are widely patent. RIGHT CAROTID SYSTEM: No dissection, occlusion or aneurysm. Mild atherosclerotic calcification at the carotid bifurcation without hemodynamically significant stenosis. LEFT CAROTID SYSTEM: No dissection, occlusion or aneurysm. Mild atherosclerotic calcification at the carotid bifurcation without hemodynamically significant stenosis. VERTEBRAL ARTERIES: Left dominant configuration. Both origins are clearly patent. There is no dissection, occlusion or flow-limiting stenosis to the skull base (V1-V3 segments). CTA HEAD FINDINGS POSTERIOR CIRCULATION: --Vertebral arteries: Normal V4 segments. --Inferior cerebellar arteries: Normal. --Basilar artery: Normal. --Superior cerebellar arteries: Normal. --Posterior cerebral arteries (PCA): Normal. ANTERIOR  CIRCULATION: --Intracranial internal carotid arteries: Normal. --Anterior cerebral arteries (ACA): Normal. Both A1 segments are present. Patent anterior communicating artery (a-comm). --Middle cerebral arteries (MCA): Normal. VENOUS SINUSES: As permitted by contrast timing, patent. ANATOMIC VARIANTS: None Review of the MIP images confirms the above findings. IMPRESSION: 1. No emergent large vessel occlusion or hemodynamically significant stenosis of the head or neck. Aortic Atherosclerosis (ICD10-I70.0). Electronically Signed   By: Ulyses Jarred M.D.   On: 05/25/2020 21:03   CT Head Wo Contrast  Result Date: 05/25/2020 CLINICAL DATA:  Mental status change, unknown cause. Neck trauma. Additional history provided: Patient found down today. EXAM: CT HEAD WITHOUT CONTRAST CT CERVICAL SPINE WITHOUT CONTRAST TECHNIQUE: Multidetector CT imaging of the head and cervical spine was performed following the standard protocol without intravenous contrast. Multiplanar CT image reconstructions of the cervical spine were also generated. COMPARISON:  CT head/cervical spine 03/15/2018. FINDINGS: CT HEAD FINDINGS Brain: Mild generalized cerebral atrophy. Mild ill-defined hypoattenuation within the cerebral white matter is nonspecific, but compatible with chronic small vessel ischemic disease. There is no acute intracranial hemorrhage. No demarcated cortical infarct. No extra-axial fluid collection. No evidence of intracranial mass. No midline shift. Vascular: Apparent subtle asymmetric density of the left ICA terminus and M1 left MCA (for instance as seen on series 3, images 14 and 15). Skull: Normal. Negative for fracture or focal lesion. Sinuses/Orbits: Visualized orbits show no acute finding. Mild ethmoid and maxillary sinus mucosal thickening. Other: Secretions within the nasopharynx. CT CERVICAL SPINE FINDINGS Alignment: Trace grade 1 anterolisthesis at C5-C6 and T1-T2, unchanged. Skull base and vertebrae: The basion-dental  and atlanto-dental intervals are maintained.No evidence of acute fracture to the cervical spine. Soft tissues and spinal canal: No prevertebral fluid or swelling. No visible canal hematoma. Disc levels: Cervical spondylosis with multilevel disc space narrowing, disc bulges, posterior disc osteophytes, uncovertebral hypertrophy and facet arthrosis. Disc space narrowing is moderate/severe at C4-C5 and C6-C7. Upper chest: No consolidation within the imaged lung apices. No visible pneumothorax. Impression #1 below was called by telephone at the time of interpretation on 05/25/2020 at 6:43 pm to provider [facility] , who verbally acknowledged these results. IMPRESSION: CT head: 1. Apparent asymmetric density of the left ICA terminus and M1 left MCA. This may be related to streak artifact in this region. However, consider CT angiography to exclude thrombus within these vessels if there is clinical concern for an acute large vessel occlusion. 2. Otherwise, there is no evidence of acute intracranial abnormality. 3. Mild cerebral atrophy and chronic small vessel ischemic disease. 4. Mild ethmoid and maxillary sinus mucosal thickening. CT cervical spine: 1. No evidence of acute fracture to the cervical spine. 2. Mild C5-C6 and T1-T2 grade 1 anterolisthesis, unchanged. 3. Cervical spondylosis as described. Electronically Signed   By: Kellie Simmering DO   On: 05/25/2020 18:44   CT Angio Neck W and/or Wo Contrast  Result Date: 05/25/2020 CLINICAL DATA:  Found down EXAM: CT ANGIOGRAPHY HEAD AND NECK TECHNIQUE: Multidetector CT imaging of the head and neck was performed using the standard protocol during bolus administration of intravenous contrast. Multiplanar CT image reconstructions and MIPs were obtained to evaluate the vascular anatomy. Carotid stenosis measurements (when applicable) are obtained utilizing NASCET criteria, using the distal internal carotid diameter as the denominator. CONTRAST:  1mL OMNIPAQUE IOHEXOL 350  MG/ML SOLN COMPARISON:  None. FINDINGS: CTA NECK FINDINGS SKELETON: There is no bony spinal canal stenosis. No lytic or blastic lesion. OTHER NECK: Normal pharynx, larynx and major salivary glands. No cervical lymphadenopathy. Unremarkable thyroid gland. UPPER CHEST: No pneumothorax or pleural effusion. No nodules or masses. AORTIC ARCH: There is calcific atherosclerosis of the aortic arch. There is no aneurysm, dissection or hemodynamically significant stenosis of the visualized portion of the aorta. Conventional 3 vessel aortic branching pattern. The visualized proximal subclavian arteries are widely patent. RIGHT CAROTID SYSTEM: No dissection, occlusion or aneurysm. Mild atherosclerotic calcification at the carotid bifurcation without hemodynamically significant stenosis. LEFT CAROTID SYSTEM: No dissection, occlusion or aneurysm. Mild atherosclerotic calcification at the carotid bifurcation without hemodynamically significant stenosis. VERTEBRAL ARTERIES: Left dominant configuration. Both origins are clearly patent. There is no dissection, occlusion or flow-limiting stenosis to the skull base (V1-V3 segments). CTA HEAD FINDINGS POSTERIOR CIRCULATION: --Vertebral arteries: Normal V4 segments. --Inferior cerebellar arteries: Normal. --Basilar artery: Normal. --Superior cerebellar arteries: Normal. --Posterior cerebral arteries (PCA): Normal. ANTERIOR CIRCULATION: --Intracranial internal carotid arteries: Normal. --Anterior cerebral arteries (ACA): Normal. Both A1 segments are present. Patent anterior communicating artery (a-comm). --Middle cerebral arteries (MCA): Normal. VENOUS SINUSES: As permitted by contrast timing, patent. ANATOMIC VARIANTS: None Review of the MIP images confirms the above findings. IMPRESSION: 1. No emergent large vessel occlusion or hemodynamically significant stenosis of the head or neck. Aortic Atherosclerosis (ICD10-I70.0). Electronically Signed   By: Ulyses Jarred M.D.   On: 05/25/2020  21:03   CT Cervical Spine Wo Contrast  Result Date: 05/25/2020 CLINICAL DATA:  Mental status change, unknown cause. Neck trauma. Additional history provided: Patient found down today. EXAM: CT HEAD WITHOUT CONTRAST CT CERVICAL SPINE WITHOUT CONTRAST TECHNIQUE: Multidetector CT imaging of the head and cervical spine was  performed following the standard protocol without intravenous contrast. Multiplanar CT image reconstructions of the cervical spine were also generated. COMPARISON:  CT head/cervical spine 03/15/2018. FINDINGS: CT HEAD FINDINGS Brain: Mild generalized cerebral atrophy. Mild ill-defined hypoattenuation within the cerebral white matter is nonspecific, but compatible with chronic small vessel ischemic disease. There is no acute intracranial hemorrhage. No demarcated cortical infarct. No extra-axial fluid collection. No evidence of intracranial mass. No midline shift. Vascular: Apparent subtle asymmetric density of the left ICA terminus and M1 left MCA (for instance as seen on series 3, images 14 and 15). Skull: Normal. Negative for fracture or focal lesion. Sinuses/Orbits: Visualized orbits show no acute finding. Mild ethmoid and maxillary sinus mucosal thickening. Other: Secretions within the nasopharynx. CT CERVICAL SPINE FINDINGS Alignment: Trace grade 1 anterolisthesis at C5-C6 and T1-T2, unchanged. Skull base and vertebrae: The basion-dental and atlanto-dental intervals are maintained.No evidence of acute fracture to the cervical spine. Soft tissues and spinal canal: No prevertebral fluid or swelling. No visible canal hematoma. Disc levels: Cervical spondylosis with multilevel disc space narrowing, disc bulges, posterior disc osteophytes, uncovertebral hypertrophy and facet arthrosis. Disc space narrowing is moderate/severe at C4-C5 and C6-C7. Upper chest: No consolidation within the imaged lung apices. No visible pneumothorax. Impression #1 below was called by telephone at the time of  interpretation on 05/25/2020 at 6:43 pm to provider [facility] , who verbally acknowledged these results. IMPRESSION: CT head: 1. Apparent asymmetric density of the left ICA terminus and M1 left MCA. This may be related to streak artifact in this region. However, consider CT angiography to exclude thrombus within these vessels if there is clinical concern for an acute large vessel occlusion. 2. Otherwise, there is no evidence of acute intracranial abnormality. 3. Mild cerebral atrophy and chronic small vessel ischemic disease. 4. Mild ethmoid and maxillary sinus mucosal thickening. CT cervical spine: 1. No evidence of acute fracture to the cervical spine. 2. Mild C5-C6 and T1-T2 grade 1 anterolisthesis, unchanged. 3. Cervical spondylosis as described. Electronically Signed   By: Kellie Simmering DO   On: 05/25/2020 18:44   DG Chest Port 1 View  Result Date: 05/25/2020 CLINICAL DATA:  Found down EXAM: PORTABLE CHEST 1 VIEW COMPARISON:  04/22/2018 FINDINGS: The heart size and mediastinal contours are within normal limits. Both lungs are clear. The visualized skeletal structures are unremarkable. IMPRESSION: No active disease. Electronically Signed   By: Randa Ngo M.D.   On: 05/25/2020 19:42   DG Knee Complete 4 Views Right  Result Date: 05/25/2020 CLINICAL DATA:  Found down EXAM: RIGHT KNEE - COMPLETE 4+ VIEW COMPARISON:  None. FINDINGS: Frontal, bilateral oblique, and cross-table lateral views of the right knee are obtained. No fracture, subluxation, or dislocation. Three compartmental osteoarthritis greatest in the medial and patellofemoral compartments. No joint effusion. Soft tissues are unremarkable. IMPRESSION: 1. Osteoarthritis.  No acute fracture. Electronically Signed   By: Randa Ngo M.D.   On: 05/25/2020 19:47   DG Hip Unilat W or Wo Pelvis 2-3 Views Right  Result Date: 05/25/2020 CLINICAL DATA:  Sepsis, found down EXAM: DG HIP (WITH OR WITHOUT PELVIS) 2-3V RIGHT COMPARISON:  None.  FINDINGS: View of the pelvis as well as frontal and frogleg lateral views of the right hip are obtained. No fracture, subluxation, or dislocation. Symmetrical bilateral hip osteoarthritis. Sacroiliac joints are normal. IMPRESSION: 1. Osteoarthritis.  No acute fracture. Electronically Signed   By: Randa Ngo M.D.   On: 05/25/2020 19:40    EKG: Independently reviewed.  Sinus  rhythm with PVCs and PACs.  Artifact in leads I, 2, 3, aVR, aVL, aVF.  Assessment/Plan Principal Problem:   Acute encephalopathy Active Problems:   Hyperlipidemia   GERD (gastroesophageal reflux disease)   Hypothyroidism   Gastroesophageal reflux disease without esophagitis   Hypomagnesemia   Chronic kidney disease, stage III (moderate) (HCC)  Acute Encephalopathy > Last seen normal afternoon prior to admission.  Found down the afternoon of admission. > Baseline is alert and oriented to self and place, now with increased disorientation to place and recent events. > Possible vascular abnormality on CT head being confirmed with CTA.  No focal deficit to suspect stroke at this time. > No leukocytosis to suggest systemic infection, but there was suprapubic tenderness so possibility of UTI > She does have a history of hypomagnesemia and syncope and collapse is possible this could be playing a factor though does not explain her altered mental status. - Stroke swallow screen before starting p.o. medications - Follow-up CTA results - Orthostatic vital signs - Follow-up urinalysis and urine culture - Follow-up blood cultures - Check TSH - Trend lactic acid, initial elevated in the setting of being found down  Rhabdomyolysis > In the setting of likely being down most of the day > Lactic acid of 2.3 and CK of 808 on admission > Status post 1 L bolus in ED - Trend lactic acid and CK - IV fluids at 150 cc/h, adjust as needed - Follow-up urine studies  CKD III Hypokalemia Hypomagnesemia (Chronic) > Creatinine stable  at 1.05 on admission > Mg 1.2, K 3.1 - Avoid nephrotoxic agents - Trend renal function - 2g IV Mg - 66mEq PO KCl  GERD - Continue PPI  Hypothyroidism - Continue home Synthroid, 75 mg twice a week per son report - Checking TSH  HLD - Continue home statin  DVT prophylaxis: Lovenox Code Status:   DNR  Family Communication:  Son updated at bedside  Disposition Plan:   Patient is from:  Home  Anticipated DC to:  Pending clinical course  Anticipated DC date:  Pending clinical course  Anticipated DC barriers: None  Consults called:  None  Admission status:  Observation, telemetry  Severity of Illness: The appropriate patient status for this patient is OBSERVATION. Observation status is judged to be reasonable and necessary in order to provide the required intensity of service to ensure the patient's safety. The patient's presenting symptoms, physical exam findings, and initial radiographic and laboratory data in the context of their medical condition is felt to place them at decreased risk for further clinical deterioration. Furthermore, it is anticipated that the patient will be medically stable for discharge from the hospital within 2 midnights of admission. The following factors support the patient status of observation.   " The patient's presenting symptoms include acute encephalopathy, fall. " The physical exam findings include encephalopathy. " The initial radiographic and laboratory data are concerning for hypomagnesemia, elevated CK, elevated lactic acid.Marcelyn Bruins MD [facility]  How to contact the [facility] Attending or Consulting provider [place] or covering provider during after hours [place], for this patient?   1. Check the care team in [facility] and look for a) attending/consulting [facility] provider listed and b) the [facility] team listed 2. Log into [email or web address] and use [place]'s universal password to access. If you do not have the password, please contact the  hospital operator. 3. Locate the [facility] provider you are looking for under [facility] and page  to a number that you can be directly reached. 4. If you still have difficulty reaching the provider, please page the [facility] (Director on Call) for the Hospitalists listed on amion for assistance.  05/25/2020, 9:35 PM

## 2020-05-25 NOTE — ED Notes (Signed)
Called lab for add on magnesium.

## 2020-05-26 ENCOUNTER — Observation Stay (HOSPITAL_COMMUNITY): Payer: Medicare Other

## 2020-05-26 DIAGNOSIS — R4182 Altered mental status, unspecified: Secondary | ICD-10-CM | POA: Diagnosis not present

## 2020-05-26 DIAGNOSIS — Z856 Personal history of leukemia: Secondary | ICD-10-CM | POA: Diagnosis not present

## 2020-05-26 DIAGNOSIS — E538 Deficiency of other specified B group vitamins: Secondary | ICD-10-CM | POA: Diagnosis present

## 2020-05-26 DIAGNOSIS — Z66 Do not resuscitate: Secondary | ICD-10-CM | POA: Diagnosis present

## 2020-05-26 DIAGNOSIS — M19042 Primary osteoarthritis, left hand: Secondary | ICD-10-CM | POA: Diagnosis present

## 2020-05-26 DIAGNOSIS — N1831 Chronic kidney disease, stage 3a: Secondary | ICD-10-CM | POA: Diagnosis present

## 2020-05-26 DIAGNOSIS — I700 Atherosclerosis of aorta: Secondary | ICD-10-CM | POA: Diagnosis present

## 2020-05-26 DIAGNOSIS — R410 Disorientation, unspecified: Secondary | ICD-10-CM

## 2020-05-26 DIAGNOSIS — Z789 Other specified health status: Secondary | ICD-10-CM | POA: Diagnosis not present

## 2020-05-26 DIAGNOSIS — E038 Other specified hypothyroidism: Secondary | ICD-10-CM | POA: Diagnosis not present

## 2020-05-26 DIAGNOSIS — E785 Hyperlipidemia, unspecified: Secondary | ICD-10-CM | POA: Diagnosis present

## 2020-05-26 DIAGNOSIS — E039 Hypothyroidism, unspecified: Secondary | ICD-10-CM | POA: Diagnosis present

## 2020-05-26 DIAGNOSIS — Z83438 Family history of other disorder of lipoprotein metabolism and other lipidemia: Secondary | ICD-10-CM | POA: Diagnosis not present

## 2020-05-26 DIAGNOSIS — E876 Hypokalemia: Secondary | ICD-10-CM | POA: Diagnosis present

## 2020-05-26 DIAGNOSIS — R9082 White matter disease, unspecified: Secondary | ICD-10-CM | POA: Diagnosis not present

## 2020-05-26 DIAGNOSIS — G9341 Metabolic encephalopathy: Secondary | ICD-10-CM | POA: Diagnosis present

## 2020-05-26 DIAGNOSIS — W19XXXA Unspecified fall, initial encounter: Secondary | ICD-10-CM | POA: Diagnosis not present

## 2020-05-26 DIAGNOSIS — M6282 Rhabdomyolysis: Secondary | ICD-10-CM | POA: Diagnosis present

## 2020-05-26 DIAGNOSIS — Z515 Encounter for palliative care: Secondary | ICD-10-CM | POA: Diagnosis not present

## 2020-05-26 DIAGNOSIS — K219 Gastro-esophageal reflux disease without esophagitis: Secondary | ICD-10-CM | POA: Diagnosis present

## 2020-05-26 DIAGNOSIS — M19041 Primary osteoarthritis, right hand: Secondary | ICD-10-CM | POA: Diagnosis present

## 2020-05-26 DIAGNOSIS — E860 Dehydration: Secondary | ICD-10-CM | POA: Diagnosis present

## 2020-05-26 DIAGNOSIS — Z7189 Other specified counseling: Secondary | ICD-10-CM | POA: Diagnosis not present

## 2020-05-26 DIAGNOSIS — R54 Age-related physical debility: Secondary | ICD-10-CM | POA: Diagnosis not present

## 2020-05-26 DIAGNOSIS — F0390 Unspecified dementia without behavioral disturbance: Secondary | ICD-10-CM | POA: Diagnosis present

## 2020-05-26 DIAGNOSIS — G9340 Encephalopathy, unspecified: Secondary | ICD-10-CM | POA: Diagnosis not present

## 2020-05-26 DIAGNOSIS — Z20822 Contact with and (suspected) exposure to covid-19: Secondary | ICD-10-CM | POA: Diagnosis present

## 2020-05-26 DIAGNOSIS — Z8249 Family history of ischemic heart disease and other diseases of the circulatory system: Secondary | ICD-10-CM | POA: Diagnosis not present

## 2020-05-26 DIAGNOSIS — Z85828 Personal history of other malignant neoplasm of skin: Secondary | ICD-10-CM | POA: Diagnosis not present

## 2020-05-26 DIAGNOSIS — N390 Urinary tract infection, site not specified: Secondary | ICD-10-CM | POA: Diagnosis present

## 2020-05-26 DIAGNOSIS — I129 Hypertensive chronic kidney disease with stage 1 through stage 4 chronic kidney disease, or unspecified chronic kidney disease: Secondary | ICD-10-CM | POA: Diagnosis present

## 2020-05-26 DIAGNOSIS — Z825 Family history of asthma and other chronic lower respiratory diseases: Secondary | ICD-10-CM | POA: Diagnosis not present

## 2020-05-26 DIAGNOSIS — Z833 Family history of diabetes mellitus: Secondary | ICD-10-CM | POA: Diagnosis not present

## 2020-05-26 LAB — COMPREHENSIVE METABOLIC PANEL
ALT: 16 U/L (ref 0–44)
AST: 41 U/L (ref 15–41)
Albumin: 2.7 g/dL — ABNORMAL LOW (ref 3.5–5.0)
Alkaline Phosphatase: 52 U/L (ref 38–126)
Anion gap: 12 (ref 5–15)
BUN: 10 mg/dL (ref 8–23)
CO2: 19 mmol/L — ABNORMAL LOW (ref 22–32)
Calcium: 8.5 mg/dL — ABNORMAL LOW (ref 8.9–10.3)
Chloride: 108 mmol/L (ref 98–111)
Creatinine, Ser: 0.82 mg/dL (ref 0.44–1.00)
GFR, Estimated: >60 mL/min (ref >60)
Glucose, Bld: 76 mg/dL (ref 70–99)
Potassium: 3.8 mmol/L (ref 3.5–5.1)
Sodium: 139 mmol/L (ref 135–145)
Total Bilirubin: 1.3 mg/dL — ABNORMAL HIGH (ref 0.3–1.2)
Total Protein: 6.5 g/dL (ref 6.5–8.1)

## 2020-05-26 LAB — URINALYSIS, ROUTINE W REFLEX MICROSCOPIC
Bacteria, UA: NONE SEEN
Bilirubin Urine: NEGATIVE
Glucose, UA: NEGATIVE mg/dL
Ketones, ur: 5 mg/dL — AB
Leukocytes,Ua: NEGATIVE
Nitrite: NEGATIVE
Protein, ur: 30 mg/dL — AB
Specific Gravity, Urine: 1.038 — ABNORMAL HIGH (ref 1.005–1.030)
pH: 7 (ref 5.0–8.0)

## 2020-05-26 LAB — CBC
HCT: 36.1 % (ref 36.0–46.0)
Hemoglobin: 11.2 g/dL — ABNORMAL LOW (ref 12.0–15.0)
MCH: 30.7 pg (ref 26.0–34.0)
MCHC: 31 g/dL (ref 30.0–36.0)
MCV: 98.9 fL (ref 80.0–100.0)
Platelets: 216 10*3/uL (ref 150–400)
RBC: 3.65 MIL/uL — ABNORMAL LOW (ref 3.87–5.11)
RDW: 13.7 % (ref 11.5–15.5)
WBC: 8.1 10*3/uL (ref 4.0–10.5)
nRBC: 0 % (ref 0.0–0.2)

## 2020-05-26 LAB — TSH: TSH: 2.537 u[IU]/mL (ref 0.350–4.500)

## 2020-05-26 LAB — MAGNESIUM: Magnesium: 1.8 mg/dL (ref 1.7–2.4)

## 2020-05-26 LAB — LACTIC ACID, PLASMA: Lactic Acid, Venous: 0.9 mmol/L (ref 0.5–1.9)

## 2020-05-26 LAB — PROTIME-INR
INR: 1.3 — ABNORMAL HIGH (ref 0.8–1.2)
Prothrombin Time: 15.6 seconds — ABNORMAL HIGH (ref 11.4–15.2)

## 2020-05-26 LAB — AMMONIA: Ammonia: 26 umol/L (ref 9–35)

## 2020-05-26 LAB — VITAMIN B12: Vitamin B-12: 175 pg/mL — ABNORMAL LOW (ref 180–914)

## 2020-05-26 MED ORDER — SODIUM CHLORIDE 0.9 % IV SOLN: INTRAVENOUS | Status: DC

## 2020-05-26 MED ORDER — OXYCODONE HCL 5 MG PO TABS: 5.0000 mg | ORAL_TABLET | ORAL | Status: DC | PRN

## 2020-05-26 MED ORDER — ACETAMINOPHEN 500 MG PO TABS
1000.0000 mg | ORAL_TABLET | Freq: Four times a day (QID) | ORAL | Status: DC
  Administered 2020-05-26 – 2020-05-27 (×3): 1000 mg via ORAL
  Filled 2020-05-26 (×3): qty 2

## 2020-05-26 NOTE — Progress Notes (Signed)
  Donna Burch is a 86 y.o. female patient admitted from ED and arrived at the unit via stretcher at 1615. Patient is awake, alert  & orientated  X 2,  DNR, VSS - Blood pressure (!) 151/89, pulse 88, temperature 98.7 F (37.1 C), temperature source Oral, resp. rate 16, height 5\' 4"  (1.626 m), weight 54 kg, SpO2 100 %. on room air, no c/o shortness of breath, no c/o chest pain, no distress noted. Tele  placed and pt is currently running:.   IV site WDL with a transparent dsg that's clean dry and intact. Pt orientation to unit, room and routine. Information packet given to patient/family.  Admission INP armband ID verified with patient/family, and in place. SR up x 2, fall risk assessment complete with Patient and family verbalizing understanding of risks associated with falls. Pt verbalizes an understanding of how to use the call bell and to call for help before getting out of bed.  Skin, clean-dry- intact with evidence of generalized bruising due to fall,  No evidence of skin break down noted on exam.  Will cont to monitor and assist as needed.  Dorris Carnes, RN 05/26/2020 7:42 PM

## 2020-05-26 NOTE — Progress Notes (Signed)
SLP Cancellation Note  Patient Details Name: Donna Burch MRN: [id number] DOB: [birth year]   Cancelled treatment:       Reason Eval/Treat Not Completed: SLP screened, no needs identified, will sign off. Pt passed [place] in ED, started diet, tolerating. Will d/c order.    Eulalio Reamy, Katherene Ponto 05/26/2020, 2:10 PM

## 2020-05-26 NOTE — ED Notes (Signed)
Pts son at bedside. States patient sleeps "off and on all the time". Neuro checks difficult as patient has dementia, and states "I don't want to" when asked to lift legs etc. Pt does not want to try eating and is comfortable being left to sleep. Denies pain.

## 2020-05-26 NOTE — ED Notes (Signed)
Report given to [employer], Therapist, sports. Denies any questions

## 2020-05-26 NOTE — Plan of Care (Signed)
  Problem: Safety: Goal: Ability to remain free from injury will improve Outcome: Progressing   

## 2020-05-26 NOTE — ED Notes (Signed)
NACL was not stopped

## 2020-05-26 NOTE — Evaluation (Signed)
Physical Therapy Evaluation Patient Details Name: Donna Burch MRN: [id number] DOB: [birth year] Today's Date: 05/26/2020   History of Present Illness  86 y.o. female with medical history significant of aortic atherosclerosis, bradycardia, CKD 3, CLL, gallstones, GERD, HLD, hypothyroidism, syncope, thyroid nodules who presents after being found down at home with altered mental status.   Clinical Impression  Pt presents to PT with deficits in functional mobility, gait, balance, cognition, and endurance. Pt requires assistance to perform bed mobility, LE weakness present possibly related to pain as pt often reports R hip and knee soreness. Pt is able to ambulate a short distance with PT but becomes incontinent and is subsequently returned to recliner. Pt is anxious at times during session, wanting to go home and requiring re-orientation to the situation. PT recommends discharge home with 24/7 assistance and HHPT at this time. If family is unable to provide 24/7 assistance then pt will benefit from ALF vs SNF pending progression with mobility.    Follow Up Recommendations Home health PT;Supervision/Assistance - 24 hour (if 24/7 not available pt may need SNF vs ALF)    Equipment Recommendations  3in1 (PT)    Recommendations for Other Services       Precautions / Restrictions Precautions Precautions: Fall Restrictions Weight Bearing Restrictions: No      Mobility  Bed Mobility Overal bed mobility: Needs Assistance Bed Mobility: Supine to Sit     Supine to sit: Mod assist     General bed mobility comments: requires assistance to mobilize LEs to edge of bed, BUE support to pull into sitting    Transfers Overall transfer level: Needs assistance Equipment used: 1 person hand held assist Transfers: Sit to/from Stand;Stand Pivot Transfers Sit to Stand: Min assist Stand pivot transfers: Min assist          Ambulation/Gait Ambulation/Gait assistance: Min assist Gait Distance  (Feet): 10 Feet Assistive device: 1 person hand held assist Gait Pattern/deviations: Step-to pattern Gait velocity: reduced Gait velocity interpretation: <1.31 ft/sec, indicative of household ambulator General Gait Details: pt with short step-to gait, pt holding onto bed rail when available. Pt becomes incontinent of urine, limiting ambulation distance.  Stairs            Wheelchair Mobility    Modified Rankin (Stroke Patients Only)       Balance Overall balance assessment: Needs assistance Sitting-balance support: No upper extremity supported;Feet supported Sitting balance-Leahy Scale: Fair     Standing balance support: Single extremity supported Standing balance-Leahy Scale: Poor Standing balance comment: reliant on UE support of hand hold or railing                             Pertinent Vitals/Pain Pain Assessment: Faces Faces Pain Scale: Hurts little more Pain Location: hips Pain Descriptors / Indicators: Sore Pain Intervention(s): Monitored during session    Home Living Family/patient expects to be discharged to:: Private residence Living Arrangements: Alone Available Help at Discharge: Family;Available PRN/intermittently Type of Home: House Home Access: Stairs to enter Entrance Stairs-Rails: None Entrance Stairs-Number of Steps: 2 Home Layout: One level Home Equipment: Cane - single point      Prior Function Level of Independence: Independent         Comments: per son pt reports she ambulates with cane, performs most ADLs independently     Hand Dominance   Dominant Hand: Right    Extremity/Trunk Assessment   Upper Extremity Assessment Upper Extremity Assessment: LUE deficits/detail  LUE Deficits / Details: shoulder flexion limited to ~ 90 degrees AROM, pt reports this is chronic, grossly 4-/5    Lower Extremity Assessment Lower Extremity Assessment: Generalized weakness (bruising noted on R hip)    Cervical / Trunk  Assessment Cervical / Trunk Assessment: Kyphotic  Communication   Communication: No difficulties  Cognition Arousal/Alertness: Awake/alert Behavior During Therapy: Anxious (wants to go home) Overall Cognitive Status: History of cognitive impairments - at baseline                                 General Comments: pt with history of dementia, disoriented to place, time, situation. Pt requires re-orientation multiple times this session, often expressing that she wants to leave the hospital. Does not understand why she is here. Pt has impaired memory at baseline due to dementia. Pt follows one step commands well, has reduced awareness of deficits and safety      General Comments General comments (skin integrity, edema, etc.): VSS on RA, son present, pt very confused    Exercises     Assessment/Plan    PT Assessment Patient needs continued PT services  PT Problem List Decreased strength;Decreased activity tolerance;Decreased balance;Decreased mobility;Decreased cognition;Decreased safety awareness;Decreased knowledge of precautions;Pain       PT Treatment Interventions DME instruction;Gait training;Stair training;Functional mobility training;Therapeutic activities;Therapeutic exercise;Neuromuscular re-education;Balance training;Cognitive remediation;Patient/family education    PT Goals (Current goals can be found in the Care Plan section)  Acute Rehab PT Goals Patient Stated Goal: to go home PT Goal Formulation: With patient Time For Goal Achievement: 06/09/20 Potential to Achieve Goals: Fair    Frequency Min 3X/week   Barriers to discharge        Co-evaluation               AM-PAC PT "6 Clicks" Mobility  Outcome Measure Help needed turning from your back to your side while in a flat bed without using bedrails?: A Little Help needed moving from lying on your back to sitting on the side of a flat bed without using bedrails?: A Lot Help needed moving to and  from a bed to a chair (including a wheelchair)?: A Little Help needed standing up from a chair using your arms (e.g., wheelchair or bedside chair)?: A Little Help needed to walk in hospital room?: A Little Help needed climbing 3-5 steps with a railing? : A Lot 6 Click Score: 16    End of Session   Activity Tolerance: Patient tolerated treatment well Patient left: in chair;with call bell/phone within reach;with chair alarm set Nurse Communication: Mobility status PT Visit Diagnosis: Unsteadiness on feet (R26.81);Other abnormalities of gait and mobility (R26.89)    Time: 5621-3086 PT Time Calculation (min) (ACUTE ONLY): 29 min   Charges:   PT Evaluation $PT Eval Moderate Complexity: 1 Mod          Zenaida Niece, PT, DPT Acute Rehabilitation Pager: [phone]   Zenaida Niece 05/26/2020, 3:12 PM

## 2020-05-26 NOTE — ED Notes (Signed)
Lunch tray here. Patient is eating with sons encouragement

## 2020-05-26 NOTE — Progress Notes (Signed)
PROGRESS NOTE    Donna Burch  XBD:[id number] DOB: Jun 14, 1927 DOA: 05/25/2020 PCP: Harlan Stains, MD    Chief Complaint  Patient presents with  . Altered Mental Status    Brief Narrative:  Donna Burch is a 86 y.o. female with medical history significant of aortic atherosclerosis, bradycardia, CKD 3, CLL, gallstones, GERD, HLD, hypothyroidism, syncope, thyroid nodules who presents after being found down at home with altered mental status. She lives alone and is usually able to take care of her ADL'S. She was found face down on the floor. She was found to be in acute rhabdomyolysis, dehydrated, mild renal insufficiency, admitted for further evaluation.    Assessment & Plan:   Principal Problem:   Acute encephalopathy Active Problems:   Hyperlipidemia   GERD (gastroesophageal reflux disease)   Hypothyroidism   Gastroesophageal reflux disease without esophagitis   Hypomagnesemia   Chronic kidney disease, stage III (moderate) (HCC)    Acute metabolic encephalopathy superimposed on dementia.  Unclear etiology.  So far no source of infection so far.  CT head without contrast is negative.  MRI brain without contrast pending.     Hyperlipidemia:  Resume lipitor.     Hypokalemia and hypomagnesemia Replaced.    Mild renal insufficiency and Acute rhabdomyolysis.  - improving with IV fluids.  Repeat CK levels and creatinine.    Hypothyroidism:  Resume synthroid.    GERD:  Stable.    In view of her multiple medical problems, advanced age and progressive dementia recommend palliative care consult for goals of care discussion.  -  DVT prophylaxis: (Lovenox) Code Status: DNR Family Communication: none at bedside.  Disposition:   Status is: Observation  The patient will require care spanning > 2 midnights and should be moved to inpatient because: Unsafe d/c plan and IV treatments appropriate due to intensity of illness or inability to take PO  Dispo: The  patient is from: Home              Anticipated d/c is to: pending.               Anticipated d/c date is: 2 days              Patient currently is not medically stable to d/c.       Consultants:   None.    Procedures:     CTA  Of the head No emergent large vessel occlusion or hemodynamically significant  stenosis of the head or neck   Antimicrobials: none.    Subjective: Calm, not in distress  Objective: Vitals:   05/26/20 1115 05/26/20 1130 05/26/20 1200 05/26/20 1300  BP:  136/62 131/74 (!) 116/58  Pulse: (!) 55 (!) 33 87 72  Resp: 15 20 (!) 21 15  Temp:      TempSrc:      SpO2: 100% 98% 97% 100%  Weight:      Height:        Intake/Output Summary (Last 24 hours) at 05/26/2020 1317 Last data filed at 05/25/2020 2140 Gross per 24 hour  Intake 1000 ml  Output --  Net 1000 ml   Filed Weights   05/25/20 1708 05/25/20 2134  Weight: 61.2 kg 54 kg    Examination:  General exam: Appears calm and comfortable  Respiratory system: Clear to auscultation. Respiratory effort normal. Cardiovascular system: S1 & S2 heard, RRR. No pedal edema. Gastrointestinal system: Abdomen is nondistended, soft and nontender.  Normal bowel sounds heard. Central nervous system: Alert ,  but confused.  Extremities: no pedal edema.  Skin: No rashes, lesions or ulcers Psychiatry:  Mood & affect appropriate.     Data Reviewed: I have personally reviewed following labs and imaging studies  CBC: Recent Labs  Lab 05/25/20 1710 05/26/20 0306  WBC 10.3 8.1  NEUTROABS 7.6  --   HGB 11.8* 11.2*  HCT 37.6 36.1  MCV 95.7 98.9  PLT 212 637    Basic Metabolic Panel: Recent Labs  Lab 05/25/20 1710 05/25/20 1850 05/26/20 0306  NA 140  --  139  K 4.4  --  3.8  CL 106  --  108  CO2 18*  --  19*  GLUCOSE 103*  --  76  BUN 15  --  10  CREATININE 1.05*  --  0.82  CALCIUM 9.3  --  8.5*  MG  --  1.2* 1.8    GFR: Estimated Creatinine Clearance: 38.1 mL/min (by C-G formula based  on SCr of 0.82 mg/dL).  Liver Function Tests: Recent Labs  Lab 05/25/20 1710 05/26/20 0306  AST 44* 41  ALT 19 16  ALKPHOS 60 52  BILITOT 1.3* 1.3*  PROT 7.4 6.5  ALBUMIN 3.2* 2.7*    CBG: No results for input(s): GLUCAP in the last 168 hours.   Recent Results (from the past 240 hour(s))  Resp Panel by RT-PCR (Flu A&B, Covid) Nasopharyngeal Swab     Status: None   Collection Time: 05/25/20 10:03 PM   Specimen: Nasopharyngeal Swab; Nasopharyngeal(NP) swabs in vial transport medium  Result Value Ref Range Status   SARS Coronavirus 2 by RT PCR NEGATIVE NEGATIVE Final    Comment: (NOTE) SARS-CoV-2 target nucleic acids are NOT DETECTED.  The SARS-CoV-2 RNA is generally detectable in upper respiratory specimens during the acute phase of infection. The lowest concentration of SARS-CoV-2 viral copies this assay can detect is 138 copies/mL. A negative result does not preclude SARS-Cov-2 infection and should not be used as the sole basis for treatment or other patient management decisions. A negative result may occur with  improper specimen collection/handling, submission of specimen other than nasopharyngeal swab, presence of viral mutation(s) within the areas targeted by this assay, and inadequate number of viral copies(<138 copies/mL). A negative result must be combined with clinical observations, patient history, and epidemiological information. The expected result is Negative.  Fact Sheet for Patients:  EntrepreneurPulse.com.au  Fact Sheet for Healthcare Providers:  IncredibleEmployment.be  This test is no t yet approved or cleared by the [place] FDA and  has been authorized for detection and/or diagnosis of SARS-CoV-2 by FDA under an Emergency Use Authorization (EUA). This EUA will remain  in effect (meaning this test can be used) for the duration of the COVID-19 declaration under Section 564(b)(1) of the Act, 21 U.S.C.section  360bbb-3(b)(1), unless the authorization is terminated  or revoked sooner.       Influenza A by PCR NEGATIVE NEGATIVE Final   Influenza B by PCR NEGATIVE NEGATIVE Final    Comment: (NOTE) The Xpert Xpress SARS-CoV-2/FLU/RSV plus assay is intended as an aid in the diagnosis of influenza from Nasopharyngeal swab specimens and should not be used as a sole basis for treatment. Nasal washings and aspirates are unacceptable for Xpert Xpress SARS-CoV-2/FLU/RSV testing.  Fact Sheet for Patients: EntrepreneurPulse.com.au  Fact Sheet for Healthcare Providers: IncredibleEmployment.be  This test is not yet approved or cleared by the [place] FDA and has been authorized for detection and/or diagnosis of SARS-CoV-2 by FDA under an Emergency Use  Authorization (EUA). This EUA will remain in effect (meaning this test can be used) for the duration of the COVID-19 declaration under Section 564(b)(1) of the Act, 21 U.S.C. section 360bbb-3(b)(1), unless the authorization is terminated or revoked.  Performed at [facility], [place] [address], [place], [place] [address]          Radiology Studies: CT Angio Head W or Wo Contrast  Result Date: 05/25/2020 CLINICAL DATA:  Found down EXAM: CT ANGIOGRAPHY HEAD AND NECK TECHNIQUE: Multidetector CT imaging of the head and neck was performed using the standard protocol during bolus administration of intravenous contrast. Multiplanar CT image reconstructions and MIPs were obtained to evaluate the vascular anatomy. Carotid stenosis measurements (when applicable) are obtained utilizing NASCET criteria, using the distal internal carotid diameter as the denominator. CONTRAST:  31mL OMNIPAQUE IOHEXOL 350 MG/ML SOLN COMPARISON:  None. FINDINGS: CTA NECK FINDINGS SKELETON: There is no bony spinal canal stenosis. No lytic or blastic lesion. OTHER NECK: Normal pharynx, larynx and major salivary glands. No cervical  lymphadenopathy. Unremarkable thyroid gland. UPPER CHEST: No pneumothorax or pleural effusion. No nodules or masses. AORTIC ARCH: There is calcific atherosclerosis of the aortic arch. There is no aneurysm, dissection or hemodynamically significant stenosis of the visualized portion of the aorta. Conventional 3 vessel aortic branching pattern. The visualized proximal subclavian arteries are widely patent. RIGHT CAROTID SYSTEM: No dissection, occlusion or aneurysm. Mild atherosclerotic calcification at the carotid bifurcation without hemodynamically significant stenosis. LEFT CAROTID SYSTEM: No dissection, occlusion or aneurysm. Mild atherosclerotic calcification at the carotid bifurcation without hemodynamically significant stenosis. VERTEBRAL ARTERIES: Left dominant configuration. Both origins are clearly patent. There is no dissection, occlusion or flow-limiting stenosis to the skull base (V1-V3 segments). CTA HEAD FINDINGS POSTERIOR CIRCULATION: --Vertebral arteries: Normal V4 segments. --Inferior cerebellar arteries: Normal. --Basilar artery: Normal. --Superior cerebellar arteries: Normal. --Posterior cerebral arteries (PCA): Normal. ANTERIOR CIRCULATION: --Intracranial internal carotid arteries: Normal. --Anterior cerebral arteries (ACA): Normal. Both A1 segments are present. Patent anterior communicating artery (a-comm). --Middle cerebral arteries (MCA): Normal. VENOUS SINUSES: As permitted by contrast timing, patent. ANATOMIC VARIANTS: None Review of the MIP images confirms the above findings. IMPRESSION: 1. No emergent large vessel occlusion or hemodynamically significant stenosis of the head or neck. Aortic Atherosclerosis (ICD10-I70.0). Electronically Signed   By: Ulyses Jarred M.D.   On: 05/25/2020 21:03   CT Head Wo Contrast  Result Date: 05/25/2020 CLINICAL DATA:  Mental status change, unknown cause. Neck trauma. Additional history provided: Patient found down today. EXAM: CT HEAD WITHOUT CONTRAST CT  CERVICAL SPINE WITHOUT CONTRAST TECHNIQUE: Multidetector CT imaging of the head and cervical spine was performed following the standard protocol without intravenous contrast. Multiplanar CT image reconstructions of the cervical spine were also generated. COMPARISON:  CT head/cervical spine 03/15/2018. FINDINGS: CT HEAD FINDINGS Brain: Mild generalized cerebral atrophy. Mild ill-defined hypoattenuation within the cerebral white matter is nonspecific, but compatible with chronic small vessel ischemic disease. There is no acute intracranial hemorrhage. No demarcated cortical infarct. No extra-axial fluid collection. No evidence of intracranial mass. No midline shift. Vascular: Apparent subtle asymmetric density of the left ICA terminus and M1 left MCA (for instance as seen on series 3, images 14 and 15). Skull: Normal. Negative for fracture or focal lesion. Sinuses/Orbits: Visualized orbits show no acute finding. Mild ethmoid and maxillary sinus mucosal thickening. Other: Secretions within the nasopharynx. CT CERVICAL SPINE FINDINGS Alignment: Trace grade 1 anterolisthesis at C5-C6 and T1-T2, unchanged. Skull base and vertebrae: The basion-dental and atlanto-dental intervals are  maintained.No evidence of acute fracture to the cervical spine. Soft tissues and spinal canal: No prevertebral fluid or swelling. No visible canal hematoma. Disc levels: Cervical spondylosis with multilevel disc space narrowing, disc bulges, posterior disc osteophytes, uncovertebral hypertrophy and facet arthrosis. Disc space narrowing is moderate/severe at C4-C5 and C6-C7. Upper chest: No consolidation within the imaged lung apices. No visible pneumothorax. Impression #1 below was called by telephone at the time of interpretation on 05/25/2020 at 6:43 pm to provider [facility] , who verbally acknowledged these results. IMPRESSION: CT head: 1. Apparent asymmetric density of the left ICA terminus and M1 left MCA. This may be related to streak  artifact in this region. However, consider CT angiography to exclude thrombus within these vessels if there is clinical concern for an acute large vessel occlusion. 2. Otherwise, there is no evidence of acute intracranial abnormality. 3. Mild cerebral atrophy and chronic small vessel ischemic disease. 4. Mild ethmoid and maxillary sinus mucosal thickening. CT cervical spine: 1. No evidence of acute fracture to the cervical spine. 2. Mild C5-C6 and T1-T2 grade 1 anterolisthesis, unchanged. 3. Cervical spondylosis as described. Electronically Signed   By: Kellie Simmering DO   On: 05/25/2020 18:44   CT Angio Neck W and/or Wo Contrast  Result Date: 05/25/2020 CLINICAL DATA:  Found down EXAM: CT ANGIOGRAPHY HEAD AND NECK TECHNIQUE: Multidetector CT imaging of the head and neck was performed using the standard protocol during bolus administration of intravenous contrast. Multiplanar CT image reconstructions and MIPs were obtained to evaluate the vascular anatomy. Carotid stenosis measurements (when applicable) are obtained utilizing NASCET criteria, using the distal internal carotid diameter as the denominator. CONTRAST:  43mL OMNIPAQUE IOHEXOL 350 MG/ML SOLN COMPARISON:  None. FINDINGS: CTA NECK FINDINGS SKELETON: There is no bony spinal canal stenosis. No lytic or blastic lesion. OTHER NECK: Normal pharynx, larynx and major salivary glands. No cervical lymphadenopathy. Unremarkable thyroid gland. UPPER CHEST: No pneumothorax or pleural effusion. No nodules or masses. AORTIC ARCH: There is calcific atherosclerosis of the aortic arch. There is no aneurysm, dissection or hemodynamically significant stenosis of the visualized portion of the aorta. Conventional 3 vessel aortic branching pattern. The visualized proximal subclavian arteries are widely patent. RIGHT CAROTID SYSTEM: No dissection, occlusion or aneurysm. Mild atherosclerotic calcification at the carotid bifurcation without hemodynamically significant stenosis.  LEFT CAROTID SYSTEM: No dissection, occlusion or aneurysm. Mild atherosclerotic calcification at the carotid bifurcation without hemodynamically significant stenosis. VERTEBRAL ARTERIES: Left dominant configuration. Both origins are clearly patent. There is no dissection, occlusion or flow-limiting stenosis to the skull base (V1-V3 segments). CTA HEAD FINDINGS POSTERIOR CIRCULATION: --Vertebral arteries: Normal V4 segments. --Inferior cerebellar arteries: Normal. --Basilar artery: Normal. --Superior cerebellar arteries: Normal. --Posterior cerebral arteries (PCA): Normal. ANTERIOR CIRCULATION: --Intracranial internal carotid arteries: Normal. --Anterior cerebral arteries (ACA): Normal. Both A1 segments are present. Patent anterior communicating artery (a-comm). --Middle cerebral arteries (MCA): Normal. VENOUS SINUSES: As permitted by contrast timing, patent. ANATOMIC VARIANTS: None Review of the MIP images confirms the above findings. IMPRESSION: 1. No emergent large vessel occlusion or hemodynamically significant stenosis of the head or neck. Aortic Atherosclerosis (ICD10-I70.0). Electronically Signed   By: Ulyses Jarred M.D.   On: 05/25/2020 21:03   CT Cervical Spine Wo Contrast  Result Date: 05/25/2020 CLINICAL DATA:  Mental status change, unknown cause. Neck trauma. Additional history provided: Patient found down today. EXAM: CT HEAD WITHOUT CONTRAST CT CERVICAL SPINE WITHOUT CONTRAST TECHNIQUE: Multidetector CT imaging of the head and cervical spine was performed following the standard  protocol without intravenous contrast. Multiplanar CT image reconstructions of the cervical spine were also generated. COMPARISON:  CT head/cervical spine 03/15/2018. FINDINGS: CT HEAD FINDINGS Brain: Mild generalized cerebral atrophy. Mild ill-defined hypoattenuation within the cerebral white matter is nonspecific, but compatible with chronic small vessel ischemic disease. There is no acute intracranial hemorrhage. No  demarcated cortical infarct. No extra-axial fluid collection. No evidence of intracranial mass. No midline shift. Vascular: Apparent subtle asymmetric density of the left ICA terminus and M1 left MCA (for instance as seen on series 3, images 14 and 15). Skull: Normal. Negative for fracture or focal lesion. Sinuses/Orbits: Visualized orbits show no acute finding. Mild ethmoid and maxillary sinus mucosal thickening. Other: Secretions within the nasopharynx. CT CERVICAL SPINE FINDINGS Alignment: Trace grade 1 anterolisthesis at C5-C6 and T1-T2, unchanged. Skull base and vertebrae: The basion-dental and atlanto-dental intervals are maintained.No evidence of acute fracture to the cervical spine. Soft tissues and spinal canal: No prevertebral fluid or swelling. No visible canal hematoma. Disc levels: Cervical spondylosis with multilevel disc space narrowing, disc bulges, posterior disc osteophytes, uncovertebral hypertrophy and facet arthrosis. Disc space narrowing is moderate/severe at C4-C5 and C6-C7. Upper chest: No consolidation within the imaged lung apices. No visible pneumothorax. Impression #1 below was called by telephone at the time of interpretation on 05/25/2020 at 6:43 pm to provider [facility] , who verbally acknowledged these results. IMPRESSION: CT head: 1. Apparent asymmetric density of the left ICA terminus and M1 left MCA. This may be related to streak artifact in this region. However, consider CT angiography to exclude thrombus within these vessels if there is clinical concern for an acute large vessel occlusion. 2. Otherwise, there is no evidence of acute intracranial abnormality. 3. Mild cerebral atrophy and chronic small vessel ischemic disease. 4. Mild ethmoid and maxillary sinus mucosal thickening. CT cervical spine: 1. No evidence of acute fracture to the cervical spine. 2. Mild C5-C6 and T1-T2 grade 1 anterolisthesis, unchanged. 3. Cervical spondylosis as described. Electronically Signed    By: Kellie Simmering DO   On: 05/25/2020 18:44   DG Chest Port 1 View  Result Date: 05/25/2020 CLINICAL DATA:  Found down EXAM: PORTABLE CHEST 1 VIEW COMPARISON:  04/22/2018 FINDINGS: The heart size and mediastinal contours are within normal limits. Both lungs are clear. The visualized skeletal structures are unremarkable. IMPRESSION: No active disease. Electronically Signed   By: Randa Ngo M.D.   On: 05/25/2020 19:42   DG Knee Complete 4 Views Right  Result Date: 05/25/2020 CLINICAL DATA:  Found down EXAM: RIGHT KNEE - COMPLETE 4+ VIEW COMPARISON:  None. FINDINGS: Frontal, bilateral oblique, and cross-table lateral views of the right knee are obtained. No fracture, subluxation, or dislocation. Three compartmental osteoarthritis greatest in the medial and patellofemoral compartments. No joint effusion. Soft tissues are unremarkable. IMPRESSION: 1. Osteoarthritis.  No acute fracture. Electronically Signed   By: Randa Ngo M.D.   On: 05/25/2020 19:47   DG Hip Unilat W or Wo Pelvis 2-3 Views Right  Result Date: 05/25/2020 CLINICAL DATA:  Sepsis, found down EXAM: DG HIP (WITH OR WITHOUT PELVIS) 2-3V RIGHT COMPARISON:  None. FINDINGS: View of the pelvis as well as frontal and frogleg lateral views of the right hip are obtained. No fracture, subluxation, or dislocation. Symmetrical bilateral hip osteoarthritis. Sacroiliac joints are normal. IMPRESSION: 1. Osteoarthritis.  No acute fracture. Electronically Signed   By: Randa Ngo M.D.   On: 05/25/2020 19:40        Scheduled Meds: . aspirin  81 mg Oral Daily  . atorvastatin  80 mg Oral Daily  . enoxaparin (LOVENOX) injection  40 mg Subcutaneous Q24H  . levothyroxine  75 mcg Oral Once per day on Mon Tue Wed Thu Fri  . pantoprazole  40 mg Oral Daily  . sodium chloride flush  3 mL Intravenous Q12H   Continuous Infusions: . sodium chloride       LOS: 0 days        Hosie Poisson, MD [facility]   To contact the attending  provider between 7A-7P or the covering provider during after hours 7P-7A, please log into the web site [email or web address] and access using universal  password for that web site. If you do not have the password, please call the hospital operator.  05/26/2020, 1:17 PM

## 2020-05-27 ENCOUNTER — Inpatient Hospital Stay (HOSPITAL_COMMUNITY): Payer: Medicare Other

## 2020-05-27 DIAGNOSIS — G9340 Encephalopathy, unspecified: Secondary | ICD-10-CM | POA: Diagnosis not present

## 2020-05-27 DIAGNOSIS — K219 Gastro-esophageal reflux disease without esophagitis: Secondary | ICD-10-CM | POA: Diagnosis not present

## 2020-05-27 DIAGNOSIS — W19XXXA Unspecified fall, initial encounter: Secondary | ICD-10-CM

## 2020-05-27 DIAGNOSIS — R54 Age-related physical debility: Secondary | ICD-10-CM

## 2020-05-27 DIAGNOSIS — Z789 Other specified health status: Secondary | ICD-10-CM

## 2020-05-27 DIAGNOSIS — Z66 Do not resuscitate: Secondary | ICD-10-CM

## 2020-05-27 DIAGNOSIS — Z7189 Other specified counseling: Secondary | ICD-10-CM

## 2020-05-27 DIAGNOSIS — E785 Hyperlipidemia, unspecified: Secondary | ICD-10-CM | POA: Diagnosis not present

## 2020-05-27 DIAGNOSIS — Z515 Encounter for palliative care: Secondary | ICD-10-CM

## 2020-05-27 LAB — URINE CULTURE

## 2020-05-27 MED ORDER — LEVOTHYROXINE SODIUM 75 MCG PO TABS
37.5000 ug | ORAL_TABLET | ORAL | Status: DC
  Administered 2020-05-28: 37.5 ug via ORAL
  Filled 2020-05-27: qty 1

## 2020-05-27 NOTE — Progress Notes (Addendum)
Daily Progress Note   Patient Name: Donna Burch       Date: 05/27/2020 DOB: 1926-07-07  Age: 86 y.o. MRN#: [id number] Attending Physician: Hosie Poisson, MD Primary Care Physician: Harlan Stains, MD Admit Date: 05/25/2020  Reason for Consultation/Follow-up: Establishing goals of care  Subjective: I have reviewed medical records including EPIC notes, labs, and imaging. Received report from primary RN - no acute concerns. RN states she is eating and drinking well.   Went to visit patient at bedside - son/Donna Burch was leaving bedside and granddaughter/Donna Burch and her husband had just arrived. Spoke with son/Donna Burch briefly - introduced Palliative Medicine - provided PMT card and requested to set up a meeting - he will call with a date/time for meeting. Patient was lying in bed awake, alert, disoriented to time and situation, and able to participate in conversation. Patient does not seem to be able to make complex medical decisions for herself. No signs or non-verbal gestures of pain or discomfort noted. No respiratory distress, increased work of breathing, or secretions noted. Patient denied pain or shortness of breath. She is very frail appearing.  I introduced Palliative Medicine as specialized medical care for people living with serious illness. It focuses on providing relief from the symptoms and stress of a serious illness. The goal is to improve quality of life for both the patient and the family.  We discussed a brief life review of the patient as well as functional and nutritional status. The patient states her husband passed away many years ago; they had 3 children together, 2 sons and 1 daughter. Prior to hospitalization, [name] was living alone in a private residence where she states she was  functionally independent and able to perform all ADLs. Her two sons live down the street from her and check on her almost daily. The patient no longer drives. She enjoys watching the television show Gunsmoke at home.   We discussed patient's current illness and what it means in the larger context of patient's on-going co-morbidities. The patient does not have a clear understanding of her current medical situation. She does not understand her functional limits at this time or that she would have a high risk of rehospitalization if she returned home alone. The patient is forgetful. I attempted to elicit values and goals of care important to  the patient. Her goal is to return home as soon as possible and feels she doesn't need assistance. From our conversation, I can tell she values her independence.   Advance directives, artificial feeding and hydration, and rehospitalization were considered and discussed. The patient was not able to tell me if she had a living will or HCPOA - she seems to be ok with her children making decisions on her behalf if needed. I gently touched on MOST form topics to see if the patient had any strong thoughts - the patient was not willing to discuss stating "I don't think about these things." She was ok with me speaking to her sons.  I called her other son/Donna Burch to introduce myself and PM. I informed him I had briefly spoken with his brother, whom he stated he was currently with. They were willing to set up a family meeting - will meet with two sons tomorrow 12/5 at 2pm. Unfortunately, the patient's daughter will not be able to attend because she is currently in [place].   Discussed with patient/family the importance of continued conversation with each other and the medical providers regarding overall plan of care and treatment options, ensuring decisions are within the context of the patient's values and GOCs.    Questions and concerns were addressed. The patient/family was  encouraged to call with questions and/or concerns. PMT card/number was provided.   Length of Stay: 1  Current Medications: Scheduled Meds:  . aspirin  81 mg Oral Daily  . atorvastatin  80 mg Oral Daily  . enoxaparin (LOVENOX) injection  40 mg Subcutaneous Q24H  . [START ON 05/28/2020] levothyroxine  37.5 mcg Oral Once per day on Sun Sat  . levothyroxine  75 mcg Oral Once per day on Mon Tue Wed Thu Fri  . pantoprazole  40 mg Oral Daily  . sodium chloride flush  3 mL Intravenous Q12H    Continuous Infusions: . sodium chloride 75 mL/hr at 05/27/20 1137    PRN Meds: acetaminophen **OR** acetaminophen, oxyCODONE  Physical Exam Vitals and nursing note reviewed.  Constitutional:      General: She is awake. She is not in acute distress.    Comments: Frail appearing  Pulmonary:     Effort: No respiratory distress.  Skin:    General: Skin is warm and dry.     Findings: Abrasion and ecchymosis present.  Neurological:     Mental Status: She is alert. She is disoriented.     Motor: Weakness present.  Psychiatric:        Behavior: Behavior is cooperative.        Cognition and Memory: Cognition is impaired. Memory is impaired.             Vital Signs: BP (!) 142/76 (BP Location: Right Arm)   Pulse 66   Temp 97.9 F (36.6 C) (Oral)   Resp 19   Ht 5\' 4"  (1.626 m)   Wt 54 kg   SpO2 94%   BMI 20.43 kg/m  SpO2: SpO2: 94 % O2 Device: O2 Device: Room Air O2 Flow Rate:    Intake/output summary:   Intake/Output Summary (Last 24 hours) at 05/27/2020 1147 Last data filed at 05/27/2020 0759 Gross per 24 hour  Intake 1200.79 ml  Output --  Net 1200.79 ml   LBM: Last BM Date: 05/25/20 Baseline Weight: Weight: 61.2 kg Most recent weight: Weight: 54 kg       Palliative Assessment/Data: PPS 30-40%      Patient Active  Problem List   Diagnosis Date Noted  . Acute encephalopathy 05/25/2020  . Atherosclerosis of aorta ([place]) 08/24/2018  . Chronic kidney disease, stage III  (moderate) ([place]) 08/24/2018  . Hearing loss of both ears 08/24/2018  . Nontoxic goiter 08/24/2018  . UTI (urinary tract infection) 03/15/2018  . Community acquired pneumonia 03/15/2018  . Syncope and collapse 03/15/2018  . Primary osteoarthritis of both hands 01/09/2018  . Common bile duct calculi   . Gallstone pancreatitis 10/16/2017  . Pancreatitis 10/15/2017  . Inflammatory arthritis 05/29/2016  . Pain in joint involving multiple sites 05/29/2016  . Screening-pulmonary TB 05/29/2016  . High risk medication use 04/25/2016  . Bradycardia   . Symptomatic bradycardia   . Junctional escape rhythm   . Orthostatic hypotension   . Thyroid activity decreased   . Hypomagnesemia   . Syncope 09/01/2015  . Nausea & vomiting 09/01/2015  . Hyponatremia 09/01/2015  . Cough 09/01/2015  . Hypertension   . Hyperlipidemia   . GERD (gastroesophageal reflux disease)   . Hypothyroidism   . Gastroesophageal reflux disease without esophagitis   . CLL (chronic lymphocytic leukemia) ([place]) 07/14/2015  . Thyroid nodule, right 01/03/2015  . Thyroid nodule 01/03/2015  . Pelvic relaxation 04/08/2012  . Kidney stones 04/08/2012  . Recurrent UTI (urinary tract infection) 04/08/2012    Palliative Care Assessment & Plan   Patient Profile: 86 y.o. female  with past medical history of aortic atherosclerosis, CKD 3, chronic lymphocytic leukemia, GERD, syncope, thyroid nodules, hypothyroidism presented to the ED on 05/25/20 after her family found her face down on the ground - unknown how long she had been down. It is believed the patient likely has undiagnosed dementia. She was admitted on 05/25/2020 with acute encephalopathy, rhabdomyolysis, hypokalemia, and dehydration.   Assessment: Acute encephalopathy Hyperlipidemia GERD CKD stage III Dementia Rhabdomyolysis  Recommendations/Plan:  Continue current medical treatment  Continue DNR/DNI as previously documented  Family meeting scheduled with  patient's two sons tomorrow 12/5 at 2pm for [place]  See Dr. Delanna Ahmadi note for additional outlined recommendations   PMT will continue to follow holistically  Goals of Care and Additional Recommendations:  Limitations on Scope of Treatment: Full Scope Treatment  Code Status:    Code Status Orders  (From admission, onward)         Start     Ordered   05/25/20 2037  Do not attempt resuscitation (DNR)  Continuous       Question Answer Comment  In the event of cardiac or respiratory ARREST Do not call a "code blue"   In the event of cardiac or respiratory ARREST Do not perform Intubation, CPR, defibrillation or ACLS   In the event of cardiac or respiratory ARREST Use medication by any route, position, wound care, and other measures to relive pain and suffering. May use oxygen, suction and manual treatment of airway obstruction as needed for comfort.      05/25/20 2038        Code Status History    Date Active Date Inactive Code Status Order ID Comments User Context   03/15/2018 1936 03/17/2018 1824 DNR [id number]  Heath Lark D, DO ED   10/15/2017 1923 10/19/2017 1751 Full Code [id number]  Geradine Girt, DO ED   09/01/2015 2038 09/06/2015 1714 Full Code [id number]  Ivor Costa, MD ED   01/03/2015 1335 01/04/2015 1157 Full Code [id number]  Armandina Gemma, MD Inpatient   Advance Care Planning Activity       Prognosis:   Unable  to determine  Discharge Planning:  To Be Determined  Care plan was discussed with primary RN, Dr. Karleen Hampshire, [facility], patient, patient's family  Thank you for allowing the Palliative Medicine Team to assist in the care of this patient.   Total Time 70 minutes Prolonged Time Billed  yes       Greater than 50%  of this time was spent counseling and coordinating care related to the above assessment and plan.  Lin Landsman, NP  Please contact Palliative Medicine Team phone at [phone] for questions and concerns.

## 2020-05-27 NOTE — Evaluation (Signed)
Occupational Therapy Evaluation Patient Details Name: ALEANNA MENGE MRN: [id number] DOB: 04-Apr-1927 Today's Date: 05/27/2020    History of Present Illness 86 y.o. female with medical history significant of aortic atherosclerosis, bradycardia, CKD 3, CLL, gallstones, GERD, HLD, hypothyroidism, syncope, thyroid nodules who presents after being found down at home with altered mental status.    Clinical Impression   Pt PTA: Pt living home alone with family close by to assist as needed; pt/family reports pt was independent prior. Pt currently limited by dementia at baseline in new environment, decreased ability to care for self, decreased strength and increased pain. Pt modA for mobility and maxA overall for ADL. Pt requires multiple cues to attend to task and poor memory to recall fall and to recall what the wires, bandages on pt mean. If pt does not progress SNF will need to be considered. Family appears to be able to provide 24/7 . Pt would benefit from continued OT skilled services. OT following acutely.      Follow Up Recommendations  Home health OT;Supervision/Assistance - 24 hour (if pt does not progress SNF will need to be considered)    Equipment Recommendations  3 in 1 bedside commode    Recommendations for Other Services       Precautions / Restrictions Precautions Precautions: Fall Restrictions Weight Bearing Restrictions: No      Mobility Bed Mobility Overal bed mobility: Needs Assistance Bed Mobility: Supine to Sit     Supine to sit: Mod assist     General bed mobility comments: Assist for trunk elevation and placement of hands    Transfers Overall transfer level: Needs assistance Equipment used: 1 person hand held assist Transfers: Sit to/from Stand;Stand Pivot Transfers Sit to Stand: Min assist Stand pivot transfers: Mod assist (assist for hand placement )       General transfer comment: very slow    Balance Overall balance assessment: Needs  assistance Sitting-balance support: No upper extremity supported;Feet supported Sitting balance-Leahy Scale: Fair     Standing balance support: Single extremity supported Standing balance-Leahy Scale: Poor Standing balance comment: reliant on UE support of hand hold or railing                           ADL either performed or assessed with clinical judgement   ADL Overall ADL's : Needs assistance/impaired Eating/Feeding: Set up;Sitting;Bed level   Grooming: Set up;Sitting   Upper Body Bathing: Set up;Sitting   Lower Body Bathing: Maximal assistance;Sitting/lateral leans;Sit to/from stand   Upper Body Dressing : Set up   Lower Body Dressing: Maximal assistance   Toilet Transfer: Moderate assistance;Stand-pivot;Ambulation;RW Toilet Transfer Details (indicate cue type and reason): transfer to [place] and Hygiene: Maximal assistance;Sitting/lateral lean;Sit to/from stand Toileting - Clothing Manipulation Details (indicate cue type and reason): assist due to difficulty following commands     Functional mobility during ADLs: Moderate assistance;Cueing for safety;Cueing for sequencing;Rolling walker General ADL Comments: Pt limited by new environment, decreased ability to care for self, decreased strength and increased pain. Pt modA for mobility and maxA overall for ADL.     Vision Baseline Vision/History: Wears glasses Wears Glasses: At all times Patient Visual Report: No change from baseline Vision Assessment?: No apparent visual deficits     Perception     Praxis      Pertinent Vitals/Pain Pain Assessment: Faces Faces Pain Scale: Hurts little more Pain Location: hips Pain Descriptors / Indicators: Sore  Hand Dominance Right   Extremity/Trunk Assessment Upper Extremity Assessment Upper Extremity Assessment: LUE deficits/detail LUE Deficits / Details: LUE flexion to 90*; limited due to old injury LUE Coordination:  decreased gross motor   Lower Extremity Assessment Lower Extremity Assessment: Generalized weakness   Cervical / Trunk Assessment Cervical / Trunk Assessment: Kyphotic   Communication Communication Communication: No difficulties   Cognition Arousal/Alertness: Awake/alert Behavior During Therapy: Anxious (wants to go home) Overall Cognitive Status: History of cognitive impairments - at baseline                                 General Comments: pt with history of dementia, disoriented to place, time, situation. Pt requires re-orientation multiple times this session, often expressing that she wants to leave the hospital. Does not understand why she is here. Pt has impaired memory at baseline due to dementia. Pt follows one step commands well, has reduced awareness of deficits and safety   General Comments  VSS on RA. Pt's gdaughter in room    Exercises     Shoulder Instructions      Home Living Family/patient expects to be discharged to:: Private residence Living Arrangements: Alone Available Help at Discharge: Family;Available PRN/intermittently Type of Home: House Home Access: Stairs to enter [employer] of Steps: 2 Entrance Stairs-Rails: None Home Layout: One level               Home Equipment: Cane - single point   Additional Comments: 2 sons live on the same road as her      Prior Functioning/Environment Level of Independence: Independent        Comments: per son pt reports she ambulates with cane, performs most ADLs independently        OT Problem List: Decreased strength;Decreased activity tolerance;Impaired balance (sitting and/or standing);Decreased safety awareness;Decreased cognition;Pain;Decreased knowledge of use of DME or AE      OT Treatment/Interventions: Self-care/ADL training;Therapeutic exercise;Energy conservation;DME and/or AE instruction;Therapeutic activities;Cognitive remediation/compensation;Patient/family  education;Balance training    OT Goals(Current goals can be found in the care plan section) Acute Rehab OT Goals Patient Stated Goal: to go home OT Goal Formulation: With patient/family Time For Goal Achievement: 06/10/20 Potential to Achieve Goals: Good ADL Goals Pt Will Perform Grooming: with min guard assist;standing Pt Will Perform Lower Body Dressing: with min assist;sitting/lateral leans;sit to/from stand Pt Will Transfer to Toilet: with min guard assist;ambulating;bedside commode Pt Will Perform Toileting - Clothing Manipulation and hygiene: with min guard assist;sitting/lateral leans;sit to/from stand Additional ADL Goal #1: Pt will participate in x6 mins of ADL and functional mobility with RW for stability in order to increase independence.  OT Frequency: Min 2X/week   Barriers to D/C:            Co-evaluation              AM-PAC OT "6 Clicks" Daily Activity     Outcome Measure Help from another person eating meals?: None Help from another person taking care of personal grooming?: A Little Help from another person toileting, which includes using toliet, bedpan, or urinal?: A Lot Help from another person bathing (including washing, rinsing, drying)?: A Lot Help from another person to put on and taking off regular upper body clothing?: A Little Help from another person to put on and taking off regular lower body clothing?: A Lot 6 Click Score: 16   End of Session Equipment Utilized During Treatment: Gait belt;Rolling  walker Nurse Communication: Mobility status  Activity Tolerance: Patient tolerated treatment well Patient left: in bed;with call bell/phone within reach;with bed alarm set;with family/visitor present  OT Visit Diagnosis: Unsteadiness on feet (R26.81);Muscle weakness (generalized) (M62.81);Other symptoms and signs involving cognitive function;Pain Pain - Right/Left: Right Pain - part of body: Hip                Time: 9359-4090 OT Time Calculation  (min): 26 min Charges:  OT General Charges $OT Visit: 1 Visit OT Evaluation $OT Eval Moderate Complexity: 1 Mod OT Treatments $Self Care/Home Management : 8-22 mins  Jefferey Pica, OTR/L Acute Rehabilitation Services Pager: [phone] Office: [place] 05/27/2020, 3:18 PM

## 2020-05-27 NOTE — Progress Notes (Signed)
PROGRESS NOTE    KEIONA JENISON  YKD:[id number] DOB: Jul 19, 1926 DOA: 05/25/2020 PCP: Harlan Stains, MD    Chief Complaint  Patient presents with  . Altered Mental Status    Brief Narrative:  PAITYNN MIKUS is a 86 y.o. female with medical history significant of aortic atherosclerosis, bradycardia, CKD 3, CLL, gallstones, GERD, HLD, hypothyroidism, syncope, thyroid nodules who presents after being found down at home with altered mental status. She lives alone and is usually able to take care of her ADL'S. She was found face down on the floor. She was found to be in acute rhabdomyolysis, dehydrated, mild renal insufficiency, admitted for further evaluation.  Patient seen and examined . She reports not remembering how she ended up in the hospital.  Would like to go home.  Physical therapy evaluation recommended 24-hour supervision with home health PT and OT.  Unfortunately patient lives at home by herself and is not safe for discharge home without supervision.  Family has a meeting with palliative care and would like to make final decisions tomorrow about disposition to home versus SNF.   Assessment & Plan:   Principal Problem:   Acute encephalopathy Active Problems:   Hyperlipidemia   GERD (gastroesophageal reflux disease)   Hypothyroidism   Gastroesophageal reflux disease without esophagitis   Hypomagnesemia   Chronic kidney disease, stage III (moderate) (HCC)    Acute metabolic encephalopathy superimposed on dementia.  Unclear etiology.  So far no source of infection so far.  CT head without contrast is negative.  MRI brain without contrast is negative for acute stroke This morning she is alert, oriented to person and place only.    Hyperlipidemia:  Resume lipitor.     Hypokalemia and hypomagnesemia Replaced.  Repeat levels have normalized   Mild renal insufficiency and Acute rhabdomyolysis.  - improving with IV fluids.  Creatinine levels have  improved   Hypothyroidism:  Resume synthroid.    GERD:  Stable.    In view of her multiple medical problems, advanced age and progressive dementia recommend palliative care consult for goals of care discussion.  Plan for discharge home with 24-hour supervision versus SNF when family decides in the next 24 hours -  DVT prophylaxis: (Lovenox) Code Status: DNR Family Communication: Discussed with son at bedside Disposition:   Status is: Inpatient  The patient will require care spanning > 2 midnights and should be moved to inpatient because: Unsafe d/c plan  Dispo: The patient is from: Home              Anticipated d/c is to: pending.               Anticipated d/c date is: 1 day              Patient currently is medically stable to d/c.       Consultants:   Palliative care   Procedures:     CTA  Of the head No emergent large vessel occlusion or hemodynamically significant  stenosis of the head or neck   Antimicrobials: none.    Subjective: No new complaints at this time  Objective: Vitals:   05/26/20 1943 05/26/20 2341 05/27/20 0910 05/27/20 1138  BP: 133/75 135/62 (!) 145/69 (!) 142/76  Pulse: 87 85 66 66  Resp: 18 17 18 19   Temp: 98 F (36.7 C) 97.8 F (36.6 C) 98.1 F (36.7 C) 97.9 F (36.6 C)  TempSrc:  Oral Oral Oral  SpO2: 96% 95% 97% 94%  Weight:  Height:        Intake/Output Summary (Last 24 hours) at 05/27/2020 1543 Last data filed at 05/27/2020 0759 Gross per 24 hour  Intake 1200.79 ml  Output --  Net 1200.79 ml   Filed Weights   05/25/20 1708 05/25/20 2134  Weight: 61.2 kg 54 kg    Examination:  General exam: Alert and comfortable Respiratory system: Clear to auscultation bilaterally, no wheezing or rhonchi. Cardiovascular system: S1-S2 heard, regular rate rhythm, no JVD Gastrointestinal system: Abdomen is soft, nontender, nondistended, bowel sounds normal Central nervous system: Alert and oriented to place and person, able  to move all extremities, no focal deficits Extremities: No pitting pedal edema Skin: No rashes seen Psychiatry: Mood is appropriate   Data Reviewed: I have personally reviewed following labs and imaging studies  CBC: Recent Labs  Lab 05/25/20 1710 05/26/20 0306  WBC 10.3 8.1  NEUTROABS 7.6  --   HGB 11.8* 11.2*  HCT 37.6 36.1  MCV 95.7 98.9  PLT 212 147    Basic Metabolic Panel: Recent Labs  Lab 05/25/20 1710 05/25/20 1850 05/26/20 0306  NA 140  --  139  K 4.4  --  3.8  CL 106  --  108  CO2 18*  --  19*  GLUCOSE 103*  --  76  BUN 15  --  10  CREATININE 1.05*  --  0.82  CALCIUM 9.3  --  8.5*  MG  --  1.2* 1.8    GFR: Estimated Creatinine Clearance: 38.1 mL/min (by C-G formula based on SCr of 0.82 mg/dL).  Liver Function Tests: Recent Labs  Lab 05/25/20 1710 05/26/20 0306  AST 44* 41  ALT 19 16  ALKPHOS 60 52  BILITOT 1.3* 1.3*  PROT 7.4 6.5  ALBUMIN 3.2* 2.7*    CBG: No results for input(s): GLUCAP in the last 168 hours.   Recent Results (from the past 240 hour(s))  Resp Panel by RT-PCR (Flu A&B, Covid) Nasopharyngeal Swab     Status: None   Collection Time: 05/25/20 10:03 PM   Specimen: Nasopharyngeal Swab; Nasopharyngeal(NP) swabs in vial transport medium  Result Value Ref Range Status   SARS Coronavirus 2 by RT PCR NEGATIVE NEGATIVE Final    Comment: (NOTE) SARS-CoV-2 target nucleic acids are NOT DETECTED.  The SARS-CoV-2 RNA is generally detectable in upper respiratory specimens during the acute phase of infection. The lowest concentration of SARS-CoV-2 viral copies this assay can detect is 138 copies/mL. A negative result does not preclude SARS-Cov-2 infection and should not be used as the sole basis for treatment or other patient management decisions. A negative result may occur with  improper specimen collection/handling, submission of specimen other than nasopharyngeal swab, presence of viral mutation(s) within the areas targeted by  this assay, and inadequate number of viral copies(<138 copies/mL). A negative result must be combined with clinical observations, patient history, and epidemiological information. The expected result is Negative.  Fact Sheet for Patients:  EntrepreneurPulse.com.au  Fact Sheet for Healthcare Providers:  IncredibleEmployment.be  This test is no t yet approved or cleared by the [place] FDA and  has been authorized for detection and/or diagnosis of SARS-CoV-2 by FDA under an Emergency Use Authorization (EUA). This EUA will remain  in effect (meaning this test can be used) for the duration of the COVID-19 declaration under Section 564(b)(1) of the Act, 21 U.S.C.section 360bbb-3(b)(1), unless the authorization is terminated  or revoked sooner.       Influenza A by PCR NEGATIVE  NEGATIVE Final   Influenza B by PCR NEGATIVE NEGATIVE Final    Comment: (NOTE) The Xpert Xpress SARS-CoV-2/FLU/RSV plus assay is intended as an aid in the diagnosis of influenza from Nasopharyngeal swab specimens and should not be used as a sole basis for treatment. Nasal washings and aspirates are unacceptable for Xpert Xpress SARS-CoV-2/FLU/RSV testing.  Fact Sheet for Patients: EntrepreneurPulse.com.au  Fact Sheet for Healthcare Providers: IncredibleEmployment.be  This test is not yet approved or cleared by the [place] FDA and has been authorized for detection and/or diagnosis of SARS-CoV-2 by FDA under an Emergency Use Authorization (EUA). This EUA will remain in effect (meaning this test can be used) for the duration of the COVID-19 declaration under Section 564(b)(1) of the Act, 21 U.S.C. section 360bbb-3(b)(1), unless the authorization is terminated or revoked.  Performed at [facility], [place] [address], [place], [place] [address]   Urine culture     Status: Abnormal   Collection Time: 05/26/20  2:20 AM    Specimen: In/Out Cath Urine  Result Value Ref Range Status   Specimen Description IN/OUT CATH URINE  Final   Special Requests   Final    NONE Performed at [facility], [place] [address] [place], [place] [address]    Culture MULTIPLE SPECIES PRESENT, SUGGEST RECOLLECTION (A)  Final   Report Status 05/27/2020 FINAL  Final         Radiology Studies: CT Angio Head W or Wo Contrast  Result Date: 05/25/2020 CLINICAL DATA:  Found down EXAM: CT ANGIOGRAPHY HEAD AND NECK TECHNIQUE: Multidetector CT imaging of the head and neck was performed using the standard protocol during bolus administration of intravenous contrast. Multiplanar CT image reconstructions and MIPs were obtained to evaluate the vascular anatomy. Carotid stenosis measurements (when applicable) are obtained utilizing NASCET criteria, using the distal internal carotid diameter as the denominator. CONTRAST:  28mL OMNIPAQUE IOHEXOL 350 MG/ML SOLN COMPARISON:  None. FINDINGS: CTA NECK FINDINGS SKELETON: There is no bony spinal canal stenosis. No lytic or blastic lesion. OTHER NECK: Normal pharynx, larynx and major salivary glands. No cervical lymphadenopathy. Unremarkable thyroid gland. UPPER CHEST: No pneumothorax or pleural effusion. No nodules or masses. AORTIC ARCH: There is calcific atherosclerosis of the aortic arch. There is no aneurysm, dissection or hemodynamically significant stenosis of the visualized portion of the aorta. Conventional 3 vessel aortic branching pattern. The visualized proximal subclavian arteries are widely patent. RIGHT CAROTID SYSTEM: No dissection, occlusion or aneurysm. Mild atherosclerotic calcification at the carotid bifurcation without hemodynamically significant stenosis. LEFT CAROTID SYSTEM: No dissection, occlusion or aneurysm. Mild atherosclerotic calcification at the carotid bifurcation without hemodynamically significant stenosis. VERTEBRAL ARTERIES: Left dominant configuration. Both origins are  clearly patent. There is no dissection, occlusion or flow-limiting stenosis to the skull base (V1-V3 segments). CTA HEAD FINDINGS POSTERIOR CIRCULATION: --Vertebral arteries: Normal V4 segments. --Inferior cerebellar arteries: Normal. --Basilar artery: Normal. --Superior cerebellar arteries: Normal. --Posterior cerebral arteries (PCA): Normal. ANTERIOR CIRCULATION: --Intracranial internal carotid arteries: Normal. --Anterior cerebral arteries (ACA): Normal. Both A1 segments are present. Patent anterior communicating artery (a-comm). --Middle cerebral arteries (MCA): Normal. VENOUS SINUSES: As permitted by contrast timing, patent. ANATOMIC VARIANTS: None Review of the MIP images confirms the above findings. IMPRESSION: 1. No emergent large vessel occlusion or hemodynamically significant stenosis of the head or neck. Aortic Atherosclerosis (ICD10-I70.0). Electronically Signed   By: Ulyses Jarred M.D.   On: 05/25/2020 21:03   CT Head Wo Contrast  Result Date: 05/25/2020 CLINICAL DATA:  Mental status  change, unknown cause. Neck trauma. Additional history provided: Patient found down today. EXAM: CT HEAD WITHOUT CONTRAST CT CERVICAL SPINE WITHOUT CONTRAST TECHNIQUE: Multidetector CT imaging of the head and cervical spine was performed following the standard protocol without intravenous contrast. Multiplanar CT image reconstructions of the cervical spine were also generated. COMPARISON:  CT head/cervical spine 03/15/2018. FINDINGS: CT HEAD FINDINGS Brain: Mild generalized cerebral atrophy. Mild ill-defined hypoattenuation within the cerebral white matter is nonspecific, but compatible with chronic small vessel ischemic disease. There is no acute intracranial hemorrhage. No demarcated cortical infarct. No extra-axial fluid collection. No evidence of intracranial mass. No midline shift. Vascular: Apparent subtle asymmetric density of the left ICA terminus and M1 left MCA (for instance as seen on series 3, images 14 and  15). Skull: Normal. Negative for fracture or focal lesion. Sinuses/Orbits: Visualized orbits show no acute finding. Mild ethmoid and maxillary sinus mucosal thickening. Other: Secretions within the nasopharynx. CT CERVICAL SPINE FINDINGS Alignment: Trace grade 1 anterolisthesis at C5-C6 and T1-T2, unchanged. Skull base and vertebrae: The basion-dental and atlanto-dental intervals are maintained.No evidence of acute fracture to the cervical spine. Soft tissues and spinal canal: No prevertebral fluid or swelling. No visible canal hematoma. Disc levels: Cervical spondylosis with multilevel disc space narrowing, disc bulges, posterior disc osteophytes, uncovertebral hypertrophy and facet arthrosis. Disc space narrowing is moderate/severe at C4-C5 and C6-C7. Upper chest: No consolidation within the imaged lung apices. No visible pneumothorax. Impression #1 below was called by telephone at the time of interpretation on 05/25/2020 at 6:43 pm to provider [facility] , who verbally acknowledged these results. IMPRESSION: CT head: 1. Apparent asymmetric density of the left ICA terminus and M1 left MCA. This may be related to streak artifact in this region. However, consider CT angiography to exclude thrombus within these vessels if there is clinical concern for an acute large vessel occlusion. 2. Otherwise, there is no evidence of acute intracranial abnormality. 3. Mild cerebral atrophy and chronic small vessel ischemic disease. 4. Mild ethmoid and maxillary sinus mucosal thickening. CT cervical spine: 1. No evidence of acute fracture to the cervical spine. 2. Mild C5-C6 and T1-T2 grade 1 anterolisthesis, unchanged. 3. Cervical spondylosis as described. Electronically Signed   By: Kellie Simmering DO   On: 05/25/2020 18:44   CT Angio Neck W and/or Wo Contrast  Result Date: 05/25/2020 CLINICAL DATA:  Found down EXAM: CT ANGIOGRAPHY HEAD AND NECK TECHNIQUE: Multidetector CT imaging of the head and neck was performed using the  standard protocol during bolus administration of intravenous contrast. Multiplanar CT image reconstructions and MIPs were obtained to evaluate the vascular anatomy. Carotid stenosis measurements (when applicable) are obtained utilizing NASCET criteria, using the distal internal carotid diameter as the denominator. CONTRAST:  78mL OMNIPAQUE IOHEXOL 350 MG/ML SOLN COMPARISON:  None. FINDINGS: CTA NECK FINDINGS SKELETON: There is no bony spinal canal stenosis. No lytic or blastic lesion. OTHER NECK: Normal pharynx, larynx and major salivary glands. No cervical lymphadenopathy. Unremarkable thyroid gland. UPPER CHEST: No pneumothorax or pleural effusion. No nodules or masses. AORTIC ARCH: There is calcific atherosclerosis of the aortic arch. There is no aneurysm, dissection or hemodynamically significant stenosis of the visualized portion of the aorta. Conventional 3 vessel aortic branching pattern. The visualized proximal subclavian arteries are widely patent. RIGHT CAROTID SYSTEM: No dissection, occlusion or aneurysm. Mild atherosclerotic calcification at the carotid bifurcation without hemodynamically significant stenosis. LEFT CAROTID SYSTEM: No dissection, occlusion or aneurysm. Mild atherosclerotic calcification at the carotid bifurcation without hemodynamically significant stenosis.  VERTEBRAL ARTERIES: Left dominant configuration. Both origins are clearly patent. There is no dissection, occlusion or flow-limiting stenosis to the skull base (V1-V3 segments). CTA HEAD FINDINGS POSTERIOR CIRCULATION: --Vertebral arteries: Normal V4 segments. --Inferior cerebellar arteries: Normal. --Basilar artery: Normal. --Superior cerebellar arteries: Normal. --Posterior cerebral arteries (PCA): Normal. ANTERIOR CIRCULATION: --Intracranial internal carotid arteries: Normal. --Anterior cerebral arteries (ACA): Normal. Both A1 segments are present. Patent anterior communicating artery (a-comm). --Middle cerebral arteries (MCA):  Normal. VENOUS SINUSES: As permitted by contrast timing, patent. ANATOMIC VARIANTS: None Review of the MIP images confirms the above findings. IMPRESSION: 1. No emergent large vessel occlusion or hemodynamically significant stenosis of the head or neck. Aortic Atherosclerosis (ICD10-I70.0). Electronically Signed   By: Ulyses Jarred M.D.   On: 05/25/2020 21:03   CT Cervical Spine Wo Contrast  Result Date: 05/25/2020 CLINICAL DATA:  Mental status change, unknown cause. Neck trauma. Additional history provided: Patient found down today. EXAM: CT HEAD WITHOUT CONTRAST CT CERVICAL SPINE WITHOUT CONTRAST TECHNIQUE: Multidetector CT imaging of the head and cervical spine was performed following the standard protocol without intravenous contrast. Multiplanar CT image reconstructions of the cervical spine were also generated. COMPARISON:  CT head/cervical spine 03/15/2018. FINDINGS: CT HEAD FINDINGS Brain: Mild generalized cerebral atrophy. Mild ill-defined hypoattenuation within the cerebral white matter is nonspecific, but compatible with chronic small vessel ischemic disease. There is no acute intracranial hemorrhage. No demarcated cortical infarct. No extra-axial fluid collection. No evidence of intracranial mass. No midline shift. Vascular: Apparent subtle asymmetric density of the left ICA terminus and M1 left MCA (for instance as seen on series 3, images 14 and 15). Skull: Normal. Negative for fracture or focal lesion. Sinuses/Orbits: Visualized orbits show no acute finding. Mild ethmoid and maxillary sinus mucosal thickening. Other: Secretions within the nasopharynx. CT CERVICAL SPINE FINDINGS Alignment: Trace grade 1 anterolisthesis at C5-C6 and T1-T2, unchanged. Skull base and vertebrae: The basion-dental and atlanto-dental intervals are maintained.No evidence of acute fracture to the cervical spine. Soft tissues and spinal canal: No prevertebral fluid or swelling. No visible canal hematoma. Disc levels:  Cervical spondylosis with multilevel disc space narrowing, disc bulges, posterior disc osteophytes, uncovertebral hypertrophy and facet arthrosis. Disc space narrowing is moderate/severe at C4-C5 and C6-C7. Upper chest: No consolidation within the imaged lung apices. No visible pneumothorax. Impression #1 below was called by telephone at the time of interpretation on 05/25/2020 at 6:43 pm to provider [facility] , who verbally acknowledged these results. IMPRESSION: CT head: 1. Apparent asymmetric density of the left ICA terminus and M1 left MCA. This may be related to streak artifact in this region. However, consider CT angiography to exclude thrombus within these vessels if there is clinical concern for an acute large vessel occlusion. 2. Otherwise, there is no evidence of acute intracranial abnormality. 3. Mild cerebral atrophy and chronic small vessel ischemic disease. 4. Mild ethmoid and maxillary sinus mucosal thickening. CT cervical spine: 1. No evidence of acute fracture to the cervical spine. 2. Mild C5-C6 and T1-T2 grade 1 anterolisthesis, unchanged. 3. Cervical spondylosis as described. Electronically Signed   By: Kellie Simmering DO   On: 05/25/2020 18:44   MR BRAIN WO CONTRAST  Result Date: 05/27/2020 CLINICAL DATA:  Encephalopathy EXAM: MRI HEAD WITHOUT CONTRAST TECHNIQUE: Multiplanar, multiecho pulse sequences of the brain and surrounding structures were obtained without intravenous contrast. COMPARISON:  None. FINDINGS: Brain: No acute infarct, mass effect or extra-axial collection. No acute or chronic hemorrhage. There is multifocal hyperintense T2-weighted signal within the white matter.  Generalized volume loss without a clear lobar predilection. The midline structures are normal. Vascular: Major flow voids are preserved. Skull and upper cervical spine: Normal calvarium and skull base. Visualized upper cervical spine and soft tissues are normal. Sinuses/Orbits:No paranasal sinus fluid levels or  advanced mucosal thickening. No mastoid or middle ear effusion. Normal orbits. IMPRESSION: 1. No acute intracranial abnormality. 2. Generalized volume loss and findings of chronic small vessel disease. Electronically Signed   By: Ulyses Jarred M.D.   On: 05/27/2020 01:24   DG Chest Port 1 View  Result Date: 05/25/2020 CLINICAL DATA:  Found down EXAM: PORTABLE CHEST 1 VIEW COMPARISON:  04/22/2018 FINDINGS: The heart size and mediastinal contours are within normal limits. Both lungs are clear. The visualized skeletal structures are unremarkable. IMPRESSION: No active disease. Electronically Signed   By: Randa Ngo M.D.   On: 05/25/2020 19:42   DG Knee Complete 4 Views Right  Result Date: 05/25/2020 CLINICAL DATA:  Found down EXAM: RIGHT KNEE - COMPLETE 4+ VIEW COMPARISON:  None. FINDINGS: Frontal, bilateral oblique, and cross-table lateral views of the right knee are obtained. No fracture, subluxation, or dislocation. Three compartmental osteoarthritis greatest in the medial and patellofemoral compartments. No joint effusion. Soft tissues are unremarkable. IMPRESSION: 1. Osteoarthritis.  No acute fracture. Electronically Signed   By: Randa Ngo M.D.   On: 05/25/2020 19:47   DG Hip Unilat W or Wo Pelvis 2-3 Views Right  Result Date: 05/25/2020 CLINICAL DATA:  Sepsis, found down EXAM: DG HIP (WITH OR WITHOUT PELVIS) 2-3V RIGHT COMPARISON:  None. FINDINGS: View of the pelvis as well as frontal and frogleg lateral views of the right hip are obtained. No fracture, subluxation, or dislocation. Symmetrical bilateral hip osteoarthritis. Sacroiliac joints are normal. IMPRESSION: 1. Osteoarthritis.  No acute fracture. Electronically Signed   By: Randa Ngo M.D.   On: 05/25/2020 19:40        Scheduled Meds: . aspirin  81 mg Oral Daily  . atorvastatin  80 mg Oral Daily  . enoxaparin (LOVENOX) injection  40 mg Subcutaneous Q24H  . [START ON 05/28/2020] levothyroxine  37.5 mcg Oral Once per day on  Sun Sat  . levothyroxine  75 mcg Oral Once per day on Mon Tue Wed Thu Fri  . pantoprazole  40 mg Oral Daily  . sodium chloride flush  3 mL Intravenous Q12H   Continuous Infusions: . sodium chloride 75 mL/hr at 05/27/20 1137     LOS: 1 day        Hosie Poisson, MD [facility]   To contact the attending provider between 7A-7P or the covering provider during after hours 7P-7A, please log into the web site [email or web address] and access using universal [place] password for that web site. If you do not have the password, please call the hospital operator.  05/27/2020, 3:43 PM

## 2020-05-27 NOTE — Consult Note (Signed)
Palliative Care consult received. Patient was seen and evaluated this evening for any urgent palliative care needs.  Patient was OOB in a recliner chair when I arrived in the room and appeared to be in mild distress. I assisted RNs in getting her back into her bed. She was total assist transfer to the bed. She told me "I just want to die". She has a considerable amount of pain related to her fall at home. Both of her knees are swollen, red and have bilateral shear injuries, she has evidence of bruising on her arms and legs. Extremely frail. Her son has been in to visit today but there was no family at the bedside during my visit. She is eating most of her meal per nursing.   Recommendations:  1. DNR/consider completion of MOST form 2. Scheduled Tylenol q6 hours for pain 3. Added low dose oxycodone for moderate to severe pain 4. Patient is not safe to live alone in her frail and severely deconditioned state. She will need ATC supervision and assistance-unless family can meet that need or hire private duty caregivers -otherwise she will need LTC or ALF level care. She is in too much pain to fully assess her ability to participate with any attempt at physical rehabilitation. If her pain continues to be this severe and she is unable to bear weight I would have increased concern for a sacral insufficiency fracture or spinal compression fractures. 5. Will follow up with patient and her family tomorrow to further determine her goals of care.  Lane Hacker, DO Palliative Medicine   Time: 30 minutes Greater than 50%  of this time was spent counseling and coordinating care related to the above assessment and plan.

## 2020-05-28 DIAGNOSIS — E785 Hyperlipidemia, unspecified: Secondary | ICD-10-CM | POA: Diagnosis not present

## 2020-05-28 DIAGNOSIS — G9340 Encephalopathy, unspecified: Secondary | ICD-10-CM | POA: Diagnosis not present

## 2020-05-28 DIAGNOSIS — K219 Gastro-esophageal reflux disease without esophagitis: Secondary | ICD-10-CM | POA: Diagnosis not present

## 2020-05-28 MED ORDER — VITAMIN B-12 100 MCG PO TABS
500.0000 ug | ORAL_TABLET | Freq: Every day | ORAL | Status: DC
  Administered 2020-05-29: 500 ug via ORAL
  Filled 2020-05-28: qty 5

## 2020-05-28 MED ORDER — CEPHALEXIN 500 MG PO CAPS
500.0000 mg | ORAL_CAPSULE | Freq: Two times a day (BID) | ORAL | Status: DC
  Administered 2020-05-28 – 2020-05-29 (×2): 500 mg via ORAL
  Filled 2020-05-28 (×2): qty 1

## 2020-05-28 MED ORDER — CYANOCOBALAMIN 1000 MCG/ML IJ SOLN
1000.0000 ug | Freq: Once | INTRAMUSCULAR | Status: AC
  Administered 2020-05-28: 1000 ug via INTRAMUSCULAR
  Filled 2020-05-28: qty 1

## 2020-05-28 NOTE — Progress Notes (Addendum)
Daily Progress Note   Patient Name: Donna Burch       Date: 05/28/2020 DOB: September 13, 1926  Age: 86 y.o. MRN#: [id number] Attending Physician: Hosie Poisson, MD Primary Care Physician: Harlan Stains, MD Admit Date: 05/25/2020  Reason for Consultation/Follow-up: Disposition, Establishing goals of care and Pain control  Subjective: Chart review performed. Received report from primary RN - no acute concerns.  Went to patient's bedside - son/Keivin, son/Kenn, and daughter-in-law/Nancy were present. Patient was lying in bed awake, alert, disoriented, and able to participate in simple conversation. No signs or non-verbal gestures of pain or discomfort noted. No respiratory distress, increased work of breathing, or secretions noted. Patient denied having pain.  Met with patient's two sons in conference room to discuss diagnosis, GOC, disposition, and options. The patient's daughter is currently in [place] and unable to attend meeting.  Reviewed information I received from the patient yesterday. Both sons confirm that all three children live close to the patient and between the three of them, the patient is checked on daily. Therapeutic listening was provided as Bethena Midget explains how they have noticed a physical/mental/functional decline in the patient over the last 4 years. They explain they were told earlier this year the patient likely has dementia. Education was provided that, unfortunately, dementia is a non-curable, progressive disease. Bethena Midget explains how the patient is now very negative, resulting in a loss of her friends, how she repeats herself often, cannot manage her finances anymore, and how recently when he asked for her keys she handed him a remote control and then the telephone. Explained that  this a natural progression/trajectory of dementia. Discussed physical changes in dementia as well and how, likely, the patient will not be able to live alone safely without assistance/supervision anymore due to her frail and decompensated state.  At this time, both sons seem to have a clear understanding of the patient's current medical situation. They understand she needs 24/7 assistance/supervision and if she returns home without it, she is very high risk for rehospitalization and/or injury. We discussed disposition options at length and their thoughts are outlined below:  They are most interested in bringing the patient home - they would like to get more information from [facility] about cost for home health aid support to supplement the times family cannot be in the home with her -  if they pursue this option they are open to HHPT. Their second choice if #1 is not financially fesable/doesn't work is SNF rehab now that patient's pain has improved - their hope would be to see if she could gain any of her strength back - we did discuss with this option and what seems to be progressing dementia, rehab may not be beneficial and she still may need to return home with 24/7 care or go to a LTC facility - they want to know if insurance would help cover rehab/how many rehab days she could get. Their last choice is to send her to LTC but they would also like to know if insurance would cover any of that cost, if it came down to needing it. I explained I would have TOC reach out to them and provide information on their questions for insurance/cost.  Introduced, reviewed, and completed MOST form with both sons for the patient today. It was completed as outlined below under Recommendation section.  Outpatient Palliative Care services were explained and offered. Family would like to discuss all information given to them today before making final decisions. Requested PMT reach back out tomorrow to see if they have made any  decisions after speaking with each other/processing options and after speaking with TOC.   Emotional support and therapeutic listening was provided throughout visit.  All questions and concerns addressed. Encouraged to call with questions and/or concerns. PMT card provided.   Length of Stay: 2  Current Medications: Scheduled Meds:  . aspirin  81 mg Oral Daily  . atorvastatin  80 mg Oral Daily  . enoxaparin (LOVENOX) injection  40 mg Subcutaneous Q24H  . levothyroxine  37.5 mcg Oral Once per day on Sun Sat  . levothyroxine  75 mcg Oral Once per day on Mon Tue Wed Thu Fri  . pantoprazole  40 mg Oral Daily  . sodium chloride flush  3 mL Intravenous Q12H    Continuous Infusions: . sodium chloride 75 mL/hr at 05/28/20 0200    PRN Meds: acetaminophen **OR** acetaminophen, oxyCODONE  Physical Exam Vitals and nursing note reviewed.  Constitutional:      General: She is not in acute distress.    Comments: Frail appearing  Pulmonary:     Effort: No respiratory distress.  Skin:    General: Skin is warm and dry.     Findings: Abrasion and ecchymosis present.  Neurological:     Mental Status: She is alert. She is disoriented.     Motor: Weakness present.  Psychiatric:        Cognition and Memory: Cognition is impaired. Memory is impaired.             Vital Signs: BP (!) 142/75 (BP Location: Right Arm)   Pulse 87   Temp 97.7 F (36.5 C) (Oral)   Resp 17   Ht '5\' 4"'  (1.626 m)   Wt 54 kg   SpO2 95%   BMI 20.43 kg/m  SpO2: SpO2: 95 % O2 Device: O2 Device: Room Air O2 Flow Rate:    Intake/output summary:   Intake/Output Summary (Last 24 hours) at 05/28/2020 1533 Last data filed at 05/28/2020 7829 Gross per 24 hour  Intake 1452.89 ml  Output -  Net 1452.89 ml   LBM: Last BM Date: 05/27/20 Baseline Weight: Weight: 61.2 kg Most recent weight: Weight: 54 kg       Palliative Assessment/Data: PPS 30-40%      Patient Active Problem List   Diagnosis Date Noted  .  Acute encephalopathy 05/25/2020  . Atherosclerosis of aorta ([place]) 08/24/2018  . Chronic kidney disease, stage III (moderate) ([place]) 08/24/2018  . Hearing loss of both ears 08/24/2018  . Nontoxic goiter 08/24/2018  . UTI (urinary tract infection) 03/15/2018  . Community acquired pneumonia 03/15/2018  . Syncope and collapse 03/15/2018  . Primary osteoarthritis of both hands 01/09/2018  . Common bile duct calculi   . Gallstone pancreatitis 10/16/2017  . Pancreatitis 10/15/2017  . Inflammatory arthritis 05/29/2016  . Pain in joint involving multiple sites 05/29/2016  . Screening-pulmonary TB 05/29/2016  . High risk medication use 04/25/2016  . Bradycardia   . Symptomatic bradycardia   . Junctional escape rhythm   . Orthostatic hypotension   . Thyroid activity decreased   . Hypomagnesemia   . Syncope 09/01/2015  . Nausea & vomiting 09/01/2015  . Hyponatremia 09/01/2015  . Cough 09/01/2015  . Hypertension   . Hyperlipidemia   . GERD (gastroesophageal reflux disease)   . Hypothyroidism   . Gastroesophageal reflux disease without esophagitis   . CLL (chronic lymphocytic leukemia) ([place]) 07/14/2015  . Thyroid nodule, right 01/03/2015  . Thyroid nodule 01/03/2015  . Pelvic relaxation 04/08/2012  . Kidney stones 04/08/2012  . Recurrent UTI (urinary tract infection) 04/08/2012    Palliative Care Assessment & Plan   Patient Profile: 86 y.o. female  with past medical history of aortic atherosclerosis, CKD 3, chronic lymphocytic leukemia, GERD, syncope, thyroid nodules, hypothyroidism presented to the ED on 05/25/20 after her family found her face down on the ground - unknown how long she had been down. It is believed the patient likely has undiagnosed dementia. She was admitted on 05/25/2020 with acute encephalopathy, rhabdomyolysis, hypokalemia, and dehydration.  Assessment: Acute encephalopathy Hyperlipidemia GERD CKD stage III Dementia Rhabdomyolysis  Recommendations/Plan:   Continue current medical treatment  Continue DNR/DNI as previously documented; durable DNR form completed and placed in shadow chart; copy will be scanned into [employer]  Family understands the patient needs 24/7 assistance/supervision; their goal at this time is to discharge the patient home with HHPT and provide care between family and home health aids; however, they would like questions answered from [facility] before making final decisions on discharge home vs rehab vs LTC  TOC notified and consult placed for: family's request for more information on home health aids as well as rehab/LTC insurance questions  Family is deciding if they would like outpatient Palliative Care to follow on discharge - will need follow up  MOST form completed as follows: DNR/DNI, Limited Additional Interventions, Determine use or limitation of antibiotics when infection occurs, No IV fluids, No feeding tube. Original form placed in patient's shadow chart; copy will be scanned into [employer]  Family request PMT follow up after they have spoken with [facility]  Primary family contact is son/Keivin, if he's unavailable due to possible jury duty this week, then son/Kenn  PMT will continue to follow holistically  Goals of Care and Additional Recommendations:  Limitations on Scope of Treatment: Full Scope Treatment and No Tracheostomy  Code Status:    Code Status Orders  (From admission, onward)         Start     Ordered   05/25/20 2037  Do not attempt resuscitation (DNR)  Continuous       Question Answer Comment  In the event of cardiac or respiratory ARREST Do not call a "code blue"   In the event of cardiac or respiratory ARREST Do not perform Intubation, CPR, defibrillation or ACLS  In the event of cardiac or respiratory ARREST Use medication by any route, position, wound care, and other measures to relive pain and suffering. May use oxygen, suction and manual treatment of airway obstruction as needed for comfort.       05/25/20 2038        Code Status History    Date Active Date Inactive Code Status Order ID Comments User Context   03/15/2018 1936 03/17/2018 1824 DNR [id number]  Heath Lark D, DO ED   10/15/2017 1923 10/19/2017 1751 Full Code [id number]  Geradine Girt, DO ED   09/01/2015 2038 09/06/2015 1714 Full Code [id number]  Ivor Costa, MD ED   01/03/2015 1335 01/04/2015 1157 Full Code [id number]  Armandina Gemma, MD Inpatient   Advance Care Planning Activity       Prognosis:   Unable to determine  Discharge Planning:  To Be Determined home vs rehab vs LTC  Care plan was discussed with primary RN, Dr. Karleen Hampshire, [facility], patient's family  Thank you for allowing the Palliative Medicine Team to assist in the care of this patient.   Total Time 90 minutes Prolonged Time Billed  yes       Greater than 50%  of this time was spent counseling and coordinating care related to the above assessment and plan.  Lin Landsman, NP  Please contact Palliative Medicine Team phone at [phone] for questions and concerns.

## 2020-05-28 NOTE — Progress Notes (Signed)
PROGRESS NOTE    RAPHAELA CANNADAY  OHY:[id number] DOB: 04-24-1927 DOA: 05/25/2020 PCP: Harlan Stains, MD    Chief Complaint  Patient presents with  . Altered Mental Status    Brief Narrative:  Donna Burch is a 86 y.o. female with medical history significant of aortic atherosclerosis, bradycardia, CKD 3, CLL, gallstones, GERD, HLD, hypothyroidism, syncope, thyroid nodules who presents after being found down at home with altered mental status. She lives alone and is usually able to take care of her ADL'S. She was found face down on the floor. She was found to be in acute rhabdomyolysis, dehydrated, mild renal insufficiency, admitted for further evaluation.  Patient seen and examined . She reports not remembering how she ended up in the hospital.  Would like to go home.  Physical therapy evaluation recommended 24-hour supervision with home health PT and OT.  Unfortunately patient lives at home by herself and is not safe for discharge home without supervision.  Family has a meeting with palliative care and would like to make final decisions tomorrow about disposition to home versus SNF.  Palliative meeting today and patient's family requested more time to make final decisions for home with home health versus SNF   Assessment & Plan:   Principal Problem:   Acute encephalopathy Active Problems:   Hyperlipidemia   GERD (gastroesophageal reflux disease)   Hypothyroidism   Gastroesophageal reflux disease without esophagitis   Hypomagnesemia   Chronic kidney disease, stage III (moderate) ([facility])    Acute metabolic encephalopathy superimposed on dementia.  Probably secondary to dehydration Urine cultures show multiple bacteria will add Keflex for possible UTI CT head without contrast is negative.  MRI brain without contrast is negative for acute stroke She is alert and oriented to person and place and wants to go home    Hyperlipidemia:  Resume lipitor.     Hypokalemia and  hypomagnesemia Replaced.  Repeat levels have normalized   Mild renal insufficiency and Acute rhabdomyolysis.  Stage III a CKD Creatinine back to baseline   Hypothyroidism:  Resume synthroid.    GERD:  Stable.    Vitamin B12 deficiency   Supplementation added  In view of her multiple medical problems, advanced age and progressive dementia recommend palliative care consult for goals of care discussion.  Plan for discharge home with 24-hour supervision versus SNF when family decides in the next 24 hours -  DVT prophylaxis: (Lovenox) Code Status: DNR Family Communication: None at bedside Disposition:   Status is: Inpatient  The patient will require care spanning > 2 midnights and should be moved to inpatient because: Unsafe d/c plan  Dispo: The patient is from: Home              Anticipated d/c is to: pending.               Anticipated d/c date is: 1 day              Patient currently is medically stable to d/c.       Consultants:   Palliative care   Procedures:     CTA  Of the head No emergent large vessel occlusion or hemodynamically significant  stenosis of the head or neck   Antimicrobials: none.    Subjective: No new complaints at this time, wants to go home  Objective: Vitals:   05/27/20 1937 05/27/20 2328 05/28/20 0822 05/28/20 1257  BP: (!) 177/79 (!) 156/99 (!) 160/58 (!) 142/75  Pulse: 79 68 83 87  Resp: 18 17 15 17   Temp: 98.5 F (36.9 C) 98.5 F (36.9 C) 97.6 F (36.4 C) 97.7 F (36.5 C)  TempSrc: Oral Oral Oral Oral  SpO2: 96% 97% 95% 95%  Weight:      Height:        Intake/Output Summary (Last 24 hours) at 05/28/2020 1621 Last data filed at 05/28/2020 5638 Gross per 24 hour  Intake 1452.89 ml  Output --  Net 1452.89 ml   Filed Weights   05/25/20 1708 05/25/20 2134  Weight: 61.2 kg 54 kg    Examination:  General exam: Alert and comfortable not in any kind of distress  respiratory system: Clear to auscultation  bilaterally, no wheezing or rhonchi Cardiovascular system: S1-S2 heard, regular rate rhythm, no JVD Gastrointestinal system: Abdomen is soft, nontender nondistended bowel sounds normal Central nervous system: Alert and oriented to person only, no focal deficits Extremities: No pedal edema Skin: No rashes seen Psychiatry: She is anxious and wants to go home   Data Reviewed: I have personally reviewed following labs and imaging studies  CBC: Recent Labs  Lab 05/25/20 1710 05/26/20 0306  WBC 10.3 8.1  NEUTROABS 7.6  --   HGB 11.8* 11.2*  HCT 37.6 36.1  MCV 95.7 98.9  PLT 212 756    Basic Metabolic Panel: Recent Labs  Lab 05/25/20 1710 05/25/20 1850 05/26/20 0306  NA 140  --  139  K 4.4  --  3.8  CL 106  --  108  CO2 18*  --  19*  GLUCOSE 103*  --  76  BUN 15  --  10  CREATININE 1.05*  --  0.82  CALCIUM 9.3  --  8.5*  MG  --  1.2* 1.8    GFR: Estimated Creatinine Clearance: 38.1 mL/min (by C-G formula based on SCr of 0.82 mg/dL).  Liver Function Tests: Recent Labs  Lab 05/25/20 1710 05/26/20 0306  AST 44* 41  ALT 19 16  ALKPHOS 60 52  BILITOT 1.3* 1.3*  PROT 7.4 6.5  ALBUMIN 3.2* 2.7*    CBG: No results for input(s): GLUCAP in the last 168 hours.   Recent Results (from the past 240 hour(s))  Resp Panel by RT-PCR (Flu A&B, Covid) Nasopharyngeal Swab     Status: None   Collection Time: 05/25/20 10:03 PM   Specimen: Nasopharyngeal Swab; Nasopharyngeal(NP) swabs in vial transport medium  Result Value Ref Range Status   SARS Coronavirus 2 by RT PCR NEGATIVE NEGATIVE Final    Comment: (NOTE) SARS-CoV-2 target nucleic acids are NOT DETECTED.  The SARS-CoV-2 RNA is generally detectable in upper respiratory specimens during the acute phase of infection. The lowest concentration of SARS-CoV-2 viral copies this assay can detect is 138 copies/mL. A negative result does not preclude SARS-Cov-2 infection and should not be used as the sole basis for treatment  or other patient management decisions. A negative result may occur with  improper specimen collection/handling, submission of specimen other than nasopharyngeal swab, presence of viral mutation(s) within the areas targeted by this assay, and inadequate number of viral copies(<138 copies/mL). A negative result must be combined with clinical observations, patient history, and epidemiological information. The expected result is Negative.  Fact Sheet for Patients:  EntrepreneurPulse.com.au  Fact Sheet for Healthcare Providers:  IncredibleEmployment.be  This test is no t yet approved or cleared by the [place] FDA and  has been authorized for detection and/or diagnosis of SARS-CoV-2 by FDA under an Emergency Use Authorization (EUA). This EUA  will remain  in effect (meaning this test can be used) for the duration of the COVID-19 declaration under Section 564(b)(1) of the Act, 21 U.S.C.section 360bbb-3(b)(1), unless the authorization is terminated  or revoked sooner.       Influenza A by PCR NEGATIVE NEGATIVE Final   Influenza B by PCR NEGATIVE NEGATIVE Final    Comment: (NOTE) The Xpert Xpress SARS-CoV-2/FLU/RSV plus assay is intended as an aid in the diagnosis of influenza from Nasopharyngeal swab specimens and should not be used as a sole basis for treatment. Nasal washings and aspirates are unacceptable for Xpert Xpress SARS-CoV-2/FLU/RSV testing.  Fact Sheet for Patients: EntrepreneurPulse.com.au  Fact Sheet for Healthcare Providers: IncredibleEmployment.be  This test is not yet approved or cleared by the [place] FDA and has been authorized for detection and/or diagnosis of SARS-CoV-2 by FDA under an Emergency Use Authorization (EUA). This EUA will remain in effect (meaning this test can be used) for the duration of the COVID-19 declaration under Section 564(b)(1) of the Act, 21 U.S.C. section  360bbb-3(b)(1), unless the authorization is terminated or revoked.  Performed at [facility], [place] [address] [place], [place] [address]   Urine culture     Status: Abnormal   Collection Time: 05/26/20  2:20 AM   Specimen: In/Out Cath Urine  Result Value Ref Range Status   Specimen Description IN/OUT CATH URINE  Final   Special Requests   Final    NONE Performed at [facility], [place] [address] [place], [place] [address]    Culture MULTIPLE SPECIES PRESENT, SUGGEST RECOLLECTION (A)  Final   Report Status 05/27/2020 FINAL  Final         Radiology Studies: MR BRAIN WO CONTRAST  Result Date: 05/27/2020 CLINICAL DATA:  Encephalopathy EXAM: MRI HEAD WITHOUT CONTRAST TECHNIQUE: Multiplanar, multiecho pulse sequences of the brain and surrounding structures were obtained without intravenous contrast. COMPARISON:  None. FINDINGS: Brain: No acute infarct, mass effect or extra-axial collection. No acute or chronic hemorrhage. There is multifocal hyperintense T2-weighted signal within the white matter. Generalized volume loss without a clear lobar predilection. The midline structures are normal. Vascular: Major flow voids are preserved. Skull and upper cervical spine: Normal calvarium and skull base. Visualized upper cervical spine and soft tissues are normal. Sinuses/Orbits:No paranasal sinus fluid levels or advanced mucosal thickening. No mastoid or middle ear effusion. Normal orbits. IMPRESSION: 1. No acute intracranial abnormality. 2. Generalized volume loss and findings of chronic small vessel disease. Electronically Signed   By: Ulyses Jarred M.D.   On: 05/27/2020 01:24        Scheduled Meds: . aspirin  81 mg Oral Daily  . atorvastatin  80 mg Oral Daily  . enoxaparin (LOVENOX) injection  40 mg Subcutaneous Q24H  . levothyroxine  37.5 mcg Oral Once per day on Sun Sat  . levothyroxine  75 mcg Oral Once per day on Mon Tue Wed Thu Fri  . pantoprazole  40 mg Oral Daily  .  sodium chloride flush  3 mL Intravenous Q12H   Continuous Infusions: . sodium chloride 75 mL/hr at 05/28/20 0200     LOS: 2 days        Hosie Poisson, MD [facility]   To contact the attending provider between 7A-7P or the covering provider during after hours 7P-7A, please log into the web site [email or web address] and access using universal [place] password for that web site. If you do not have the password, please call the hospital  operator.  05/28/2020, 4:21 PM

## 2020-05-28 NOTE — Progress Notes (Incomplete)
Pt refused vitals 

## 2020-05-29 DIAGNOSIS — N1831 Chronic kidney disease, stage 3a: Secondary | ICD-10-CM | POA: Diagnosis not present

## 2020-05-29 DIAGNOSIS — G9340 Encephalopathy, unspecified: Secondary | ICD-10-CM | POA: Diagnosis not present

## 2020-05-29 DIAGNOSIS — K219 Gastro-esophageal reflux disease without esophagitis: Secondary | ICD-10-CM | POA: Diagnosis not present

## 2020-05-29 DIAGNOSIS — F0390 Unspecified dementia without behavioral disturbance: Secondary | ICD-10-CM

## 2020-05-29 MED ORDER — CEPHALEXIN 500 MG PO CAPS: 500.0000 mg | ORAL_CAPSULE | Freq: Two times a day (BID) | ORAL | 0 refills | Status: AC

## 2020-05-29 MED ORDER — CYANOCOBALAMIN 500 MCG PO TABS: 500.0000 ug | ORAL_TABLET | Freq: Every day | ORAL | 1 refills | Status: AC

## 2020-05-29 NOTE — Progress Notes (Signed)
Discharge instructions (including medications) discussed with and copy provided to patient/caregiver 

## 2020-05-29 NOTE — Discharge Summary (Signed)
Physician Discharge Summary  KYLIEGH JESTER WGN:[id number] DOB: 04-27-1927 DOA: 05/25/2020  PCP: Harlan Stains, MD  Admit date: 05/25/2020 Discharge date: 05/29/2020  Admitted From: Home. Disposition: Home with home health and 24 hours supervision.   Recommendations for Outpatient Follow-up:  1. Follow up with PCP in 1-2 weeks 2. Please obtain BMP/CBC in one week 3. Please follow up on the following pending results:  Home Health: yes.   Discharge Condition: STABLE.  CODE STATUS:DNR Diet recommendation: Heart Healthy    Brief/Interim Summary:  Donna Roesler Smithis a 86 y.o.femalewith medical history significant ofaortic atherosclerosis, bradycardia, CKD 3, CLL, gallstones, GERD, HLD, hypothyroidism, syncope, thyroid nodules who presents after being found down at home with altered mental status. She lives alone and is usually able to take care of her ADL'S. She was found face down on the floor. She was found to be in acute rhabdomyolysis, dehydrated, mild renal insufficiency, admitted for further evaluation.  Patient seen and examined . She reports not remembering how she ended up in the hospital.  Would like to go home.  Physical therapy evaluation recommended 24-hour supervision with home health PT and OT.  Unfortunately patient lives at home by herself and is not safe for discharge home without supervision.  Family has a meeting with palliative care and would like to make final decisions tomorrow about disposition to home versus SNF.    Discharge Diagnoses:  Principal Problem:   Acute encephalopathy Active Problems:   Hyperlipidemia   GERD (gastroesophageal reflux disease)   Hypothyroidism   Gastroesophageal reflux disease without esophagitis   Hypomagnesemia   Chronic kidney disease, stage III (moderate) (HCC)  Acute metabolic encephalopathy superimposed on dementia.  Probably secondary to dehydration Urine cultures show multiple bacteria will add Keflex for possible UTI CT  head without contrast is negative.  MRI brain without contrast is negative for acute stroke She is alert and oriented to person and place and wants to go home    Hyperlipidemia:  Resume lipitor.     Hypokalemia and hypomagnesemia Replaced.  Repeat levels have normalized   Mild renal insufficiency and Acute rhabdomyolysis.  Stage III a CKD Creatinine back to baseline   Hypothyroidism:  Resume synthroid.    GERD:  Stable.    Vitamin B12 deficiency   Supplementation added  In view of her multiple medical problems, advanced age and progressive dementia recommend palliative care consult for goals of care discussion.  Plan for discharge home with 24-hour supervision versus SNF .-   Discharge Instructions  Discharge Instructions    Diet - low sodium heart healthy   Complete by: As directed    Increase activity slowly   Complete by: As directed      Allergies as of 05/29/2020      Reactions   Prednisone Other (See Comments)   "Jittery"   Zoledronic Acid Other (See Comments)   Pt did not have a memory, from the day after she took that medication  Other reaction(s): fever, stayed in bed for 24 hours   Hydrochlorothiazide Other (See Comments)   Caused low sodium blood levels per [facility]   Lisinopril Cough      Medication List    TAKE these medications   aspirin 81 MG chewable tablet Take 81 mg by mouth daily.   atorvastatin 80 MG tablet Commonly known as: LIPITOR Take 80 mg by mouth daily.   cephALEXin 500 MG capsule Commonly known as: KEFLEX Take 1 capsule (500 mg total) by mouth every 12 (  twelve) hours.   cholecalciferol 1000 units tablet Commonly known as: VITAMIN D Take 2 tablets (2,000 Units total) by mouth daily.   ferrous sulfate 325 (65 FE) MG EC tablet Take 1 tablet (325 mg total) by mouth daily with breakfast.   fluticasone 50 MCG/ACT nasal spray Commonly known as: FLONASE Place 1 spray into both nostrils  daily. What changed:   when to take this  reasons to take this   guaiFENesin 600 MG 12 hr tablet Commonly known as: MUCINEX Take 1 tablet (600 mg total) by mouth 2 (two) times daily.   levothyroxine 75 MCG tablet Commonly known as: SYNTHROID Take 37.5-75 mcg by mouth See admin instructions. Take 1 tablet (75 mcg) by mouth on Monday thru Friday mornings before breakfast and take 1/2 tablet (37.5 mcg) on Saturday and Sunday before breakfast   losartan 100 MG tablet Commonly known as: COZAAR Take 100 mg by mouth daily.   magnesium gluconate 30 MG tablet Commonly known as: MAGONATE Take 1 tablet (30 mg total) by mouth daily. What changed:   when to take this  additional instructions   mirtazapine 15 MG tablet Commonly known as: REMERON Take 15 mg by mouth at bedtime.   Omega-3 500 MG Caps Take 500 mg by mouth daily.   omeprazole 40 MG capsule Commonly known as: PRILOSEC Take 40 mg by mouth daily.   ONE-A-DAY WOMENS 50 PLUS PO Take 1 tablet by mouth daily.   [facility] Caps Take 1 capsule by mouth daily.   SUPER B COMPLEX PO Take 1 tablet by mouth daily.   vitamin B-12 500 MCG tablet Commonly known as: CYANOCOBALAMIN Take 1 tablet (500 mcg total) by mouth daily. Start taking on: May 30, 2020       Follow-up Information    Harlan Stains, MD. Schedule an appointment as soon as possible for a visit in 1 week(s).   Specialty: Family Medicine Contact information: [place] [id number] [phone]              Allergies  Allergen Reactions  . Prednisone Other (See Comments)    "Jittery"  . Zoledronic Acid Other (See Comments)    Pt did not have a memory, from the day after she took that medication  Other reaction(s): fever, stayed in bed for 24 hours  . Hydrochlorothiazide Other (See Comments)    Caused low sodium blood levels per [employer]  . Lisinopril Cough    Consultations:  Palliative  care.    Procedures/Studies: CT Angio Head W or Wo Contrast  Result Date: 05/25/2020 CLINICAL DATA:  Found down EXAM: CT ANGIOGRAPHY HEAD AND NECK TECHNIQUE: Multidetector CT imaging of the head and neck was performed using the standard protocol during bolus administration of intravenous contrast. Multiplanar CT image reconstructions and MIPs were obtained to evaluate the vascular anatomy. Carotid stenosis measurements (when applicable) are obtained utilizing NASCET criteria, using the distal internal carotid diameter as the denominator. CONTRAST:  58mL OMNIPAQUE IOHEXOL 350 MG/ML SOLN COMPARISON:  None. FINDINGS: CTA NECK FINDINGS SKELETON: There is no bony spinal canal stenosis. No lytic or blastic lesion. OTHER NECK: Normal pharynx, larynx and major salivary glands. No cervical lymphadenopathy. Unremarkable thyroid gland. UPPER CHEST: No pneumothorax or pleural effusion. No nodules or masses. AORTIC ARCH: There is calcific atherosclerosis of the aortic arch. There is no aneurysm, dissection or hemodynamically significant stenosis of the visualized portion of the aorta. Conventional 3 vessel aortic branching pattern. The visualized proximal  subclavian arteries are widely patent. RIGHT CAROTID SYSTEM: No dissection, occlusion or aneurysm. Mild atherosclerotic calcification at the carotid bifurcation without hemodynamically significant stenosis. LEFT CAROTID SYSTEM: No dissection, occlusion or aneurysm. Mild atherosclerotic calcification at the carotid bifurcation without hemodynamically significant stenosis. VERTEBRAL ARTERIES: Left dominant configuration. Both origins are clearly patent. There is no dissection, occlusion or flow-limiting stenosis to the skull base (V1-V3 segments). CTA HEAD FINDINGS POSTERIOR CIRCULATION: --Vertebral arteries: Normal V4 segments. --Inferior cerebellar arteries: Normal. --Basilar artery: Normal. --Superior cerebellar arteries: Normal. --Posterior cerebral arteries (PCA):  Normal. ANTERIOR CIRCULATION: --Intracranial internal carotid arteries: Normal. --Anterior cerebral arteries (ACA): Normal. Both A1 segments are present. Patent anterior communicating artery (a-comm). --Middle cerebral arteries (MCA): Normal. VENOUS SINUSES: As permitted by contrast timing, patent. ANATOMIC VARIANTS: None Review of the MIP images confirms the above findings. IMPRESSION: 1. No emergent large vessel occlusion or hemodynamically significant stenosis of the head or neck. Aortic Atherosclerosis (ICD10-I70.0). Electronically Signed   By: Ulyses Jarred M.D.   On: 05/25/2020 21:03   CT Head Wo Contrast  Result Date: 05/25/2020 CLINICAL DATA:  Mental status change, unknown cause. Neck trauma. Additional history provided: Patient found down today. EXAM: CT HEAD WITHOUT CONTRAST CT CERVICAL SPINE WITHOUT CONTRAST TECHNIQUE: Multidetector CT imaging of the head and cervical spine was performed following the standard protocol without intravenous contrast. Multiplanar CT image reconstructions of the cervical spine were also generated. COMPARISON:  CT head/cervical spine 03/15/2018. FINDINGS: CT HEAD FINDINGS Brain: Mild generalized cerebral atrophy. Mild ill-defined hypoattenuation within the cerebral white matter is nonspecific, but compatible with chronic small vessel ischemic disease. There is no acute intracranial hemorrhage. No demarcated cortical infarct. No extra-axial fluid collection. No evidence of intracranial mass. No midline shift. Vascular: Apparent subtle asymmetric density of the left ICA terminus and M1 left MCA (for instance as seen on series 3, images 14 and 15). Skull: Normal. Negative for fracture or focal lesion. Sinuses/Orbits: Visualized orbits show no acute finding. Mild ethmoid and maxillary sinus mucosal thickening. Other: Secretions within the nasopharynx. CT CERVICAL SPINE FINDINGS Alignment: Trace grade 1 anterolisthesis at C5-C6 and T1-T2, unchanged. Skull base and vertebrae:  The basion-dental and atlanto-dental intervals are maintained.No evidence of acute fracture to the cervical spine. Soft tissues and spinal canal: No prevertebral fluid or swelling. No visible canal hematoma. Disc levels: Cervical spondylosis with multilevel disc space narrowing, disc bulges, posterior disc osteophytes, uncovertebral hypertrophy and facet arthrosis. Disc space narrowing is moderate/severe at C4-C5 and C6-C7. Upper chest: No consolidation within the imaged lung apices. No visible pneumothorax. Impression #1 below was called by telephone at the time of interpretation on 05/25/2020 at 6:43 pm to provider [facility] , who verbally acknowledged these results. IMPRESSION: CT head: 1. Apparent asymmetric density of the left ICA terminus and M1 left MCA. This may be related to streak artifact in this region. However, consider CT angiography to exclude thrombus within these vessels if there is clinical concern for an acute large vessel occlusion. 2. Otherwise, there is no evidence of acute intracranial abnormality. 3. Mild cerebral atrophy and chronic small vessel ischemic disease. 4. Mild ethmoid and maxillary sinus mucosal thickening. CT cervical spine: 1. No evidence of acute fracture to the cervical spine. 2. Mild C5-C6 and T1-T2 grade 1 anterolisthesis, unchanged. 3. Cervical spondylosis as described. Electronically Signed   By: Kellie Simmering DO   On: 05/25/2020 18:44   CT Angio Neck W and/or Wo Contrast  Result Date: 05/25/2020 CLINICAL DATA:  Found down EXAM: CT ANGIOGRAPHY  HEAD AND NECK TECHNIQUE: Multidetector CT imaging of the head and neck was performed using the standard protocol during bolus administration of intravenous contrast. Multiplanar CT image reconstructions and MIPs were obtained to evaluate the vascular anatomy. Carotid stenosis measurements (when applicable) are obtained utilizing NASCET criteria, using the distal internal carotid diameter as the denominator. CONTRAST:  41mL  OMNIPAQUE IOHEXOL 350 MG/ML SOLN COMPARISON:  None. FINDINGS: CTA NECK FINDINGS SKELETON: There is no bony spinal canal stenosis. No lytic or blastic lesion. OTHER NECK: Normal pharynx, larynx and major salivary glands. No cervical lymphadenopathy. Unremarkable thyroid gland. UPPER CHEST: No pneumothorax or pleural effusion. No nodules or masses. AORTIC ARCH: There is calcific atherosclerosis of the aortic arch. There is no aneurysm, dissection or hemodynamically significant stenosis of the visualized portion of the aorta. Conventional 3 vessel aortic branching pattern. The visualized proximal subclavian arteries are widely patent. RIGHT CAROTID SYSTEM: No dissection, occlusion or aneurysm. Mild atherosclerotic calcification at the carotid bifurcation without hemodynamically significant stenosis. LEFT CAROTID SYSTEM: No dissection, occlusion or aneurysm. Mild atherosclerotic calcification at the carotid bifurcation without hemodynamically significant stenosis. VERTEBRAL ARTERIES: Left dominant configuration. Both origins are clearly patent. There is no dissection, occlusion or flow-limiting stenosis to the skull base (V1-V3 segments). CTA HEAD FINDINGS POSTERIOR CIRCULATION: --Vertebral arteries: Normal V4 segments. --Inferior cerebellar arteries: Normal. --Basilar artery: Normal. --Superior cerebellar arteries: Normal. --Posterior cerebral arteries (PCA): Normal. ANTERIOR CIRCULATION: --Intracranial internal carotid arteries: Normal. --Anterior cerebral arteries (ACA): Normal. Both A1 segments are present. Patent anterior communicating artery (a-comm). --Middle cerebral arteries (MCA): Normal. VENOUS SINUSES: As permitted by contrast timing, patent. ANATOMIC VARIANTS: None Review of the MIP images confirms the above findings. IMPRESSION: 1. No emergent large vessel occlusion or hemodynamically significant stenosis of the head or neck. Aortic Atherosclerosis (ICD10-I70.0). Electronically Signed   By: Ulyses Jarred  M.D.   On: 05/25/2020 21:03   CT Cervical Spine Wo Contrast  Result Date: 05/25/2020 CLINICAL DATA:  Mental status change, unknown cause. Neck trauma. Additional history provided: Patient found down today. EXAM: CT HEAD WITHOUT CONTRAST CT CERVICAL SPINE WITHOUT CONTRAST TECHNIQUE: Multidetector CT imaging of the head and cervical spine was performed following the standard protocol without intravenous contrast. Multiplanar CT image reconstructions of the cervical spine were also generated. COMPARISON:  CT head/cervical spine 03/15/2018. FINDINGS: CT HEAD FINDINGS Brain: Mild generalized cerebral atrophy. Mild ill-defined hypoattenuation within the cerebral white matter is nonspecific, but compatible with chronic small vessel ischemic disease. There is no acute intracranial hemorrhage. No demarcated cortical infarct. No extra-axial fluid collection. No evidence of intracranial mass. No midline shift. Vascular: Apparent subtle asymmetric density of the left ICA terminus and M1 left MCA (for instance as seen on series 3, images 14 and 15). Skull: Normal. Negative for fracture or focal lesion. Sinuses/Orbits: Visualized orbits show no acute finding. Mild ethmoid and maxillary sinus mucosal thickening. Other: Secretions within the nasopharynx. CT CERVICAL SPINE FINDINGS Alignment: Trace grade 1 anterolisthesis at C5-C6 and T1-T2, unchanged. Skull base and vertebrae: The basion-dental and atlanto-dental intervals are maintained.No evidence of acute fracture to the cervical spine. Soft tissues and spinal canal: No prevertebral fluid or swelling. No visible canal hematoma. Disc levels: Cervical spondylosis with multilevel disc space narrowing, disc bulges, posterior disc osteophytes, uncovertebral hypertrophy and facet arthrosis. Disc space narrowing is moderate/severe at C4-C5 and C6-C7. Upper chest: No consolidation within the imaged lung apices. No visible pneumothorax. Impression #1 below was called by telephone at  the time of interpretation on 05/25/2020 at 6:43 pm  to provider [facility] , who verbally acknowledged these results. IMPRESSION: CT head: 1. Apparent asymmetric density of the left ICA terminus and M1 left MCA. This may be related to streak artifact in this region. However, consider CT angiography to exclude thrombus within these vessels if there is clinical concern for an acute large vessel occlusion. 2. Otherwise, there is no evidence of acute intracranial abnormality. 3. Mild cerebral atrophy and chronic small vessel ischemic disease. 4. Mild ethmoid and maxillary sinus mucosal thickening. CT cervical spine: 1. No evidence of acute fracture to the cervical spine. 2. Mild C5-C6 and T1-T2 grade 1 anterolisthesis, unchanged. 3. Cervical spondylosis as described. Electronically Signed   By: Kellie Simmering DO   On: 05/25/2020 18:44   MR BRAIN WO CONTRAST  Result Date: 05/27/2020 CLINICAL DATA:  Encephalopathy EXAM: MRI HEAD WITHOUT CONTRAST TECHNIQUE: Multiplanar, multiecho pulse sequences of the brain and surrounding structures were obtained without intravenous contrast. COMPARISON:  None. FINDINGS: Brain: No acute infarct, mass effect or extra-axial collection. No acute or chronic hemorrhage. There is multifocal hyperintense T2-weighted signal within the white matter. Generalized volume loss without a clear lobar predilection. The midline structures are normal. Vascular: Major flow voids are preserved. Skull and upper cervical spine: Normal calvarium and skull base. Visualized upper cervical spine and soft tissues are normal. Sinuses/Orbits:No paranasal sinus fluid levels or advanced mucosal thickening. No mastoid or middle ear effusion. Normal orbits. IMPRESSION: 1. No acute intracranial abnormality. 2. Generalized volume loss and findings of chronic small vessel disease. Electronically Signed   By: Ulyses Jarred M.D.   On: 05/27/2020 01:24   DG Chest Port 1 View  Result Date: 05/25/2020 CLINICAL DATA:   Found down EXAM: PORTABLE CHEST 1 VIEW COMPARISON:  04/22/2018 FINDINGS: The heart size and mediastinal contours are within normal limits. Both lungs are clear. The visualized skeletal structures are unremarkable. IMPRESSION: No active disease. Electronically Signed   By: Randa Ngo M.D.   On: 05/25/2020 19:42   DG Knee Complete 4 Views Right  Result Date: 05/25/2020 CLINICAL DATA:  Found down EXAM: RIGHT KNEE - COMPLETE 4+ VIEW COMPARISON:  None. FINDINGS: Frontal, bilateral oblique, and cross-table lateral views of the right knee are obtained. No fracture, subluxation, or dislocation. Three compartmental osteoarthritis greatest in the medial and patellofemoral compartments. No joint effusion. Soft tissues are unremarkable. IMPRESSION: 1. Osteoarthritis.  No acute fracture. Electronically Signed   By: Randa Ngo M.D.   On: 05/25/2020 19:47   DG Hip Unilat W or Wo Pelvis 2-3 Views Right  Result Date: 05/25/2020 CLINICAL DATA:  Sepsis, found down EXAM: DG HIP (WITH OR WITHOUT PELVIS) 2-3V RIGHT COMPARISON:  None. FINDINGS: View of the pelvis as well as frontal and frogleg lateral views of the right hip are obtained. No fracture, subluxation, or dislocation. Symmetrical bilateral hip osteoarthritis. Sacroiliac joints are normal. IMPRESSION: 1. Osteoarthritis.  No acute fracture. Electronically Signed   By: Randa Ngo M.D.   On: 05/25/2020 19:40     Subjective: No new complaints.   Discharge Exam: Vitals:   05/29/20 0339 05/29/20 0830  BP: (!) 177/72 (!) 152/74  Pulse: 71 70  Resp: 17 18  Temp: 98 F (36.7 C) 97.8 F (36.6 C)  SpO2: 97% 98%   Vitals:   05/28/20 1935 05/28/20 2312 05/29/20 0339 05/29/20 0830  BP: (!) 161/73 (!) 154/77 (!) 177/72 (!) 152/74  Pulse: 80 69 71 70  Resp: 18 17 17 18   Temp: (!) 97.5 F (36.4 C) 98.2  F (36.8 C) 98 F (36.7 C) 97.8 F (36.6 C)  TempSrc: Oral Oral  Oral  SpO2: 97% 96% 97% 98%  Weight:      Height:        General: Pt is  alert, awake, not in acute distress Cardiovascular: RRR, S1/S2 +, no rubs, no gallops Respiratory: CTA bilaterally, no wheezing, no rhonchi Abdominal: Soft, NT, ND, bowel sounds + Extremities: no edema, no cyanosis    The results of significant diagnostics from this hospitalization (including imaging, microbiology, ancillary and laboratory) are listed below for reference.     Microbiology: Recent Results (from the past 240 hour(s))  Resp Panel by RT-PCR (Flu A&B, Covid) Nasopharyngeal Swab     Status: None   Collection Time: 05/25/20 10:03 PM   Specimen: Nasopharyngeal Swab; Nasopharyngeal(NP) swabs in vial transport medium  Result Value Ref Range Status   SARS Coronavirus 2 by RT PCR NEGATIVE NEGATIVE Final    Comment: (NOTE) SARS-CoV-2 target nucleic acids are NOT DETECTED.  The SARS-CoV-2 RNA is generally detectable in upper respiratory specimens during the acute phase of infection. The lowest concentration of SARS-CoV-2 viral copies this assay can detect is 138 copies/mL. A negative result does not preclude SARS-Cov-2 infection and should not be used as the sole basis for treatment or other patient management decisions. A negative result may occur with  improper specimen collection/handling, submission of specimen other than nasopharyngeal swab, presence of viral mutation(s) within the areas targeted by this assay, and inadequate number of viral copies(<138 copies/mL). A negative result must be combined with clinical observations, patient history, and epidemiological information. The expected result is Negative.  Fact Sheet for Patients:  EntrepreneurPulse.com.au  Fact Sheet for Healthcare Providers:  IncredibleEmployment.be  This test is no t yet approved or cleared by the [place] FDA and  has been authorized for detection and/or diagnosis of SARS-CoV-2 by FDA under an Emergency Use Authorization (EUA). This EUA will remain  in  effect (meaning this test can be used) for the duration of the COVID-19 declaration under Section 564(b)(1) of the Act, 21 U.S.C.section 360bbb-3(b)(1), unless the authorization is terminated  or revoked sooner.       Influenza A by PCR NEGATIVE NEGATIVE Final   Influenza B by PCR NEGATIVE NEGATIVE Final    Comment: (NOTE) The Xpert Xpress SARS-CoV-2/FLU/RSV plus assay is intended as an aid in the diagnosis of influenza from Nasopharyngeal swab specimens and should not be used as a sole basis for treatment. Nasal washings and aspirates are unacceptable for Xpert Xpress SARS-CoV-2/FLU/RSV testing.  Fact Sheet for Patients: EntrepreneurPulse.com.au  Fact Sheet for Healthcare Providers: IncredibleEmployment.be  This test is not yet approved or cleared by the [place] FDA and has been authorized for detection and/or diagnosis of SARS-CoV-2 by FDA under an Emergency Use Authorization (EUA). This EUA will remain in effect (meaning this test can be used) for the duration of the COVID-19 declaration under Section 564(b)(1) of the Act, 21 U.S.C. section 360bbb-3(b)(1), unless the authorization is terminated or revoked.  Performed at [facility], [place] [address] [place], [place] [address]   Urine culture     Status: Abnormal   Collection Time: 05/26/20  2:20 AM   Specimen: In/Out Cath Urine  Result Value Ref Range Status   Specimen Description IN/OUT CATH URINE  Final   Special Requests   Final    NONE Performed at [facility], [place] [address] [place], [place] [address]    Culture  MULTIPLE SPECIES PRESENT, SUGGEST RECOLLECTION (A)  Final   Report Status 05/27/2020 FINAL  Final     Labs: BNP (last 3 results) No results for input(s): BNP in the last 8760 hours. Basic Metabolic Panel: Recent Labs  Lab 05/25/20 1710 05/25/20 1850 05/26/20 0306  NA 140  --  139  K 4.4  --  3.8  CL 106  --  108  CO2 18*  --  19*  GLUCOSE  103*  --  76  BUN 15  --  10  CREATININE 1.05*  --  0.82  CALCIUM 9.3  --  8.5*  MG  --  1.2* 1.8   Liver Function Tests: Recent Labs  Lab 05/25/20 1710 05/26/20 0306  AST 44* 41  ALT 19 16  ALKPHOS 60 52  BILITOT 1.3* 1.3*  PROT 7.4 6.5  ALBUMIN 3.2* 2.7*   No results for input(s): LIPASE, AMYLASE in the last 168 hours. Recent Labs  Lab 05/26/20 1225  AMMONIA 26   CBC: Recent Labs  Lab 05/25/20 1710 05/26/20 0306  WBC 10.3 8.1  NEUTROABS 7.6  --   HGB 11.8* 11.2*  HCT 37.6 36.1  MCV 95.7 98.9  PLT 212 216   Cardiac Enzymes: Recent Labs  Lab 05/25/20 1710  CKTOTAL 808*   BNP: Invalid input(s): POCBNP CBG: No results for input(s): GLUCAP in the last 168 hours. D-Dimer No results for input(s): DDIMER in the last 72 hours. Hgb A1c No results for input(s): HGBA1C in the last 72 hours. Lipid Profile No results for input(s): CHOL, HDL, LDLCALC, TRIG, CHOLHDL, LDLDIRECT in the last 72 hours. Thyroid function studies No results for input(s): TSH, T4TOTAL, T3FREE, THYROIDAB in the last 72 hours.  Invalid input(s): FREET3 Anemia work up Recent Labs    05/26/20 1125  VITAMINB12 175*   Urinalysis    Component Value Date/Time   COLORURINE YELLOW 05/26/2020 0215   APPEARANCEUR CLEAR 05/26/2020 0215   LABSPEC 1.038 (H) 05/26/2020 0215   PHURINE 7.0 05/26/2020 0215   GLUCOSEU NEGATIVE 05/26/2020 0215   HGBUR MODERATE (A) 05/26/2020 0215   BILIRUBINUR NEGATIVE 05/26/2020 0215   BILIRUBINUR neg 04/08/2012 1552   KETONESUR 5 (A) 05/26/2020 0215   PROTEINUR 30 (A) 05/26/2020 0215   UROBILINOGEN negative 04/08/2012 1552   UROBILINOGEN 0.2 10/06/2010 2006   NITRITE NEGATIVE 05/26/2020 0215   LEUKOCYTESUR NEGATIVE 05/26/2020 0215   Sepsis Labs Invalid input(s): PROCALCITONIN,  WBC,  LACTICIDVEN Microbiology Recent Results (from the past 240 hour(s))  Resp Panel by RT-PCR (Flu A&B, Covid) Nasopharyngeal Swab     Status: None   Collection Time: 05/25/20  10:03 PM   Specimen: Nasopharyngeal Swab; Nasopharyngeal(NP) swabs in vial transport medium  Result Value Ref Range Status   SARS Coronavirus 2 by RT PCR NEGATIVE NEGATIVE Final    Comment: (NOTE) SARS-CoV-2 target nucleic acids are NOT DETECTED.  The SARS-CoV-2 RNA is generally detectable in upper respiratory specimens during the acute phase of infection. The lowest concentration of SARS-CoV-2 viral copies this assay can detect is 138 copies/mL. A negative result does not preclude SARS-Cov-2 infection and should not be used as the sole basis for treatment or other patient management decisions. A negative result may occur with  improper specimen collection/handling, submission of specimen other than nasopharyngeal swab, presence of viral mutation(s) within the areas targeted by this assay, and inadequate number of viral copies(<138 copies/mL). A negative result must be combined with clinical observations, patient history, and epidemiological information. The expected result is Negative.  Fact Sheet for Patients:  EntrepreneurPulse.com.au  Fact Sheet for Healthcare Providers:  IncredibleEmployment.be  This test is no t yet approved or cleared by the [place] FDA and  has been authorized for detection and/or diagnosis of SARS-CoV-2 by FDA under an Emergency Use Authorization (EUA). This EUA will remain  in effect (meaning this test can be used) for the duration of the COVID-19 declaration under Section 564(b)(1) of the Act, 21 U.S.C.section 360bbb-3(b)(1), unless the authorization is terminated  or revoked sooner.       Influenza A by PCR NEGATIVE NEGATIVE Final   Influenza B by PCR NEGATIVE NEGATIVE Final    Comment: (NOTE) The Xpert Xpress SARS-CoV-2/FLU/RSV plus assay is intended as an aid in the diagnosis of influenza from Nasopharyngeal swab specimens and should not be used as a sole basis for treatment. Nasal washings and aspirates  are unacceptable for Xpert Xpress SARS-CoV-2/FLU/RSV testing.  Fact Sheet for Patients: EntrepreneurPulse.com.au  Fact Sheet for Healthcare Providers: IncredibleEmployment.be  This test is not yet approved or cleared by the [place] FDA and has been authorized for detection and/or diagnosis of SARS-CoV-2 by FDA under an Emergency Use Authorization (EUA). This EUA will remain in effect (meaning this test can be used) for the duration of the COVID-19 declaration under Section 564(b)(1) of the Act, 21 U.S.C. section 360bbb-3(b)(1), unless the authorization is terminated or revoked.  Performed at [facility], [place] [address] [place], [place] [address]   Urine culture     Status: Abnormal   Collection Time: 05/26/20  2:20 AM   Specimen: In/Out Cath Urine  Result Value Ref Range Status   Specimen Description IN/OUT CATH URINE  Final   Special Requests   Final    NONE Performed at [facility], [place] [address] [place], [place] [address]    Culture MULTIPLE SPECIES PRESENT, SUGGEST RECOLLECTION (A)  Final   Report Status 05/27/2020 FINAL  Final     Time coordinating discharge: 32 minutes. SIGNED:   Hosie Poisson, MD  [facility] 05/29/2020, 9:56 AM

## 2020-05-29 NOTE — Progress Notes (Signed)
Pt and her son received discharge instructions. They do not have any concerns or questions at present time. Pt's son encouraged to schedule appointment with primary MD as soon as possible. Pt is ready for discharge.

## 2020-05-29 NOTE — TOC Transition Note (Signed)
Transition of Care [facility]) - CM/SW Discharge Note   Patient Details  Name: Donna BEYERSDORF MRN: [id number] Date of Birth: December 21, 1926  [facility] CM/SW Contact:  Pollie Friar, RN Phone Number: 05/29/2020, 10:44 AM   Clinical Narrative:    Pt is discharging home with 24 hour supervision by her family. [place] services arranged through [place]. [facility] for palliative care.  Pt has all needed DME: raised toilet seat/ walker/ cane Pt has transportation home.   Final next level of care: Home w Home Health Services Barriers to Discharge: No Barriers Identified   Patient Goals and CMS Choice   CMS Medicare.gov Compare Post Acute Care list provided to:: Patient Represenative (must comment) Choice offered to / list presented to : Adult Children  Discharge Placement                       Discharge Plan and Services                          HH Arranged: PT, OT, Nurse's Aide, Social Work [facility] Agency: [place] Date [place]: 05/29/20   Representative spoke with at [place] ([place]) Interventions     Readmission Risk Interventions No flowsheet data found.

## 2020-05-29 NOTE — Plan of Care (Signed)
  Problem: Clinical Measurements: Goal: Cardiovascular complication will be avoided Outcome: Progressing   Problem: Safety: Goal: Ability to remain free from injury will improve Outcome: Progressing   Problem: Education: Goal: Knowledge of General Education information will improve Description: Including pain rating scale, medication(s)/side effects and non-pharmacologic comfort measures Outcome: Not Progressing   Problem: Health Behavior/Discharge Planning: Goal: Ability to manage health-related needs will improve Outcome: Not Progressing   Problem: Activity: Goal: Risk for activity intolerance will decrease Outcome: Not Progressing

## 2020-05-29 NOTE — Progress Notes (Signed)
Discharged to home after IV access removed and discharge instructions reviewed with pt and son by [facility] nurse.

## 2020-05-29 NOTE — Progress Notes (Signed)
Daily Progress Note   Patient Name: Donna Burch       Date: 05/29/2020 DOB: [birth year]  Age: 86 y.o. MRN#: [id number] Attending Physician: Hosie Poisson, MD Primary Care Physician: Harlan Stains, MD Admit Date: 05/25/2020  Reason for Consultation/Follow-up: Follow up.  To discuss complex medical decision making related to patient's goals of care.  Patient being discharged.  Subjective: Spoke with Donna Burch on the phone for approximately 25 min.  Discussed next steps, medications, follow up care.  Talked with Donna Burch about his mother's negativity and mean attitude.  Encouraged him to work with PCP on medication (s) that may take the edge off of her "barage of negativity".   Patient rebels and stops taking her medications.  That action contributed to her hospitalization.  Provided space and time for him to express his thoughts related to his mother's care and current health.  Encouraged Donna Burch to contact us when/if they return to the hospital.   He will be followed by outpatient [facility].   Assessment: Discharging to home in the care of her family.  Progressive dementia.  Very supportive family that lives locally.       Length of Stay: 3   Vital Signs: BP (!) 152/74 (BP Location: Right Arm)   Pulse 70   Temp 97.8 F (36.6 C) (Oral)   Resp 18   Ht 5\' 4"  (1.626 m)   Wt 54 kg   SpO2 98%   BMI 20.43 kg/m  SpO2: SpO2: 98 % O2 Device: O2 Device: Room Air O2 Flow Rate:         Palliative Assessment/Data: 30-40%     Palliative Care Plan    Recommendations/Plan:  Discharging home with Palliative Care from [facility]  Code Status:  DNR  Prognosis:   Unable to determine Patient is at high risk for rehospitalization.   Discharge Planning:  Home with [place] was discussed with son  Thank you for allowing the Palliative Medicine Team to assist in the care of this patient.  Total time spent:  25 min.     Greater than 50%  of this time was spent counseling and coordinating care related to the above assessment and plan.  Florentina Jenny, PA-C Palliative Medicine  Please contact Palliative MedicineTeam phone at [phone] for questions and concerns  between 7 am - 7 pm.   Please see AMION for individual provider pager numbers.

## 2020-05-29 NOTE — Progress Notes (Signed)
Occupational Therapy Treatment Patient Details Name: Donna Burch MRN: [id number] DOB: 1927-03-14 Today's Date: 05/29/2020    History of present illness 86 y.o. female with medical history significant of aortic atherosclerosis, bradycardia, CKD 3, CLL, gallstones, GERD, HLD, hypothyroidism, syncope, thyroid nodules who presents after being found down at home with altered mental status.    OT comments  Session focused on educating son for bed positioning, need for brief for transfers, use of pads with frequent changes due to incontinence and current wounds noted that could be rubbed by wearing brief at all times. Pt incontinence could be a challenge as only the female family members will be assisting for peri care per son. Son reports me and my brother can sit with her during the day. Recommendation for d/c home with 24/7 and position changes ever 2 hours.    Follow Up Recommendations  Home health OT;Supervision/Assistance - 24 hour    Equipment Recommendations  3 in 1 bedside commode    Recommendations for Other Services      Precautions / Restrictions Precautions Precautions: Fall Precaution Comments: incontinence frequently will need brief       Mobility Bed Mobility Overal bed mobility: Needs Assistance Bed Mobility: Rolling;Supine to Sit;Sit to Supine Rolling: Mod assist   Supine to sit: Mod assist Sit to supine: Mod assist   General bed mobility comments: requires (A) with BIL LE and to elevate trunk. Pt requires (A) to place bil LE back on bed surface  Transfers Overall transfer level: Needs assistance Equipment used: Rolling walker (2 wheeled) Transfers: Sit to/from Stand Sit to Stand: Mod assist         General transfer comment: pt able to side step toward [facility]    Balance Overall balance assessment: Needs assistance Sitting-balance support: Bilateral upper extremity supported;Feet supported Sitting balance-Leahy Scale: Fair     Standing balance support:  Bilateral upper extremity supported;During functional activity Standing balance-Leahy Scale: Fair                             ADL either performed or assessed with clinical judgement   ADL Overall ADL's : Needs assistance/impaired                 Upper Body Dressing : Minimal assistance;Sitting     Lower Body Dressing Details (indicate cue type and reason): did not dress LB due to incontinence. RN requesting diaper from ED for d/c home to make sure pt is able transfer in family car Toilet Transfer: RW;Moderate assistance   Toileting- Water quality scientist and Hygiene: Total assistance         General ADL Comments: pt completed sit<.stand x4 this session for x2 peri care and once for transfer to [facility]. pt incontinence of stool and lack of awareness. Son present in room but not actively viewing session as he states "I havent seen my mom since i was little" Plan is for female family to (A) patient only per son. Provided handout of recommendations. Pt with wound on R hip area and inner thigh -- recommend avoid wearing brief at all times due to skin integrity so only wear during transfers      Vision       Perception     Praxis      Cognition Arousal/Alertness: Awake/alert Behavior During Therapy: Anxious Overall Cognitive Status: History of cognitive impairments - at baseline  General Comments: pt tearful during session reports she is just so tired. pt requires multiple hygiene attempts due to incontinence of stool        Exercises     Shoulder Instructions       General Comments noted to have wound on R thigh that a brief or diaper would rub .     Pertinent Vitals/ Pain       Pain Location: reports fatigue  Home Living                                          Prior Functioning/Environment              Frequency  Min 2X/week        Progress Toward Goals  OT Goals(current  goals can now be found in the care plan section)  Progress towards OT goals: Progressing toward goals  Acute Rehab OT Goals Patient Stated Goal: to go home OT Goal Formulation: With patient/family Time For Goal Achievement: 06/10/20 Potential to Achieve Goals: Good ADL Goals Pt Will Perform Grooming: with min guard assist;standing Pt Will Perform Lower Body Dressing: with min assist;sitting/lateral leans;sit to/from stand Pt Will Transfer to Toilet: with min guard assist;ambulating;bedside commode Pt Will Perform Toileting - Clothing Manipulation and hygiene: with min guard assist;sitting/lateral leans;sit to/from stand Additional ADL Goal #1: Pt will participate in x6 mins of ADL and functional mobility with RW for stability in order to increase independence.  Plan Discharge plan remains appropriate    Co-evaluation                 AM-PAC OT "6 Clicks" Daily Activity     Outcome Measure   Help from another person eating meals?: None Help from another person taking care of personal grooming?: A Little Help from another person toileting, which includes using toliet, bedpan, or urinal?: A Lot Help from another person bathing (including washing, rinsing, drying)?: A Lot Help from another person to put on and taking off regular upper body clothing?: A Little Help from another person to put on and taking off regular lower body clothing?: A Lot 6 Click Score: 16    End of Session Equipment Utilized During Treatment: Rolling walker  OT Visit Diagnosis: Unsteadiness on feet (R26.81);Muscle weakness (generalized) (M62.81);Other symptoms and signs involving cognitive function;Pain Pain - Right/Left: Right Pain - part of body: Hip   Activity Tolerance Patient tolerated treatment well   Patient Left in bed;with call bell/phone within reach;with bed alarm set;with family/visitor present   Nurse Communication Mobility status        Time: 7915 (1022)-1058 OT Time Calculation  (min): 36 min  Charges: OT General Charges $OT Visit: 1 Visit OT Treatments $Self Care/Home Management : 23-37 mins   Brynn, OTR/L  Acute Rehabilitation Services Pager: [phone] Office: [phone] .    Jeri Modena 05/29/2020, 12:45 PM

## 2020-05-29 NOTE — Progress Notes (Signed)
Manufacturing engineer [facility])  Hospital Liaison RN note         Notified by [facility] manager of patient/family request for [facility] Palliative services at home after discharge.         Writer spoke with son [name] to confirm interest and explain services.               [place] Palliative team will follow up with patient after discharge.         Please call with any hospice or palliative related questions.         Thank you for the opportunity to participate in this patient's care.     Chrislyn Edison Pace, BSN, RN [place] (listed on [place] under Hospice/[facility])    [phone]

## 2020-05-30 LAB — CULTURE, BLOOD (SINGLE): Culture: NO GROWTH

## 2020-05-31 ENCOUNTER — Telehealth: Payer: Self-pay

## 2020-05-31 NOTE — Telephone Encounter (Signed)
4:04PM: Palliative care SW outreached patient and patients daughter, Sharee Pimple, to schedule initial palliative care visit.   SW LVM awaiting return call.

## 2020-06-02 DIAGNOSIS — M4314 Spondylolisthesis, thoracic region: Secondary | ICD-10-CM | POA: Diagnosis not present

## 2020-06-02 DIAGNOSIS — I129 Hypertensive chronic kidney disease with stage 1 through stage 4 chronic kidney disease, or unspecified chronic kidney disease: Secondary | ICD-10-CM | POA: Diagnosis not present

## 2020-06-02 DIAGNOSIS — F0390 Unspecified dementia without behavioral disturbance: Secondary | ICD-10-CM | POA: Diagnosis not present

## 2020-06-02 DIAGNOSIS — G9341 Metabolic encephalopathy: Secondary | ICD-10-CM | POA: Diagnosis not present

## 2020-06-02 DIAGNOSIS — M47812 Spondylosis without myelopathy or radiculopathy, cervical region: Secondary | ICD-10-CM | POA: Diagnosis not present

## 2020-06-02 DIAGNOSIS — K8020 Calculus of gallbladder without cholecystitis without obstruction: Secondary | ICD-10-CM | POA: Diagnosis not present

## 2020-06-02 DIAGNOSIS — M160 Bilateral primary osteoarthritis of hip: Secondary | ICD-10-CM | POA: Diagnosis not present

## 2020-06-02 DIAGNOSIS — K219 Gastro-esophageal reflux disease without esophagitis: Secondary | ICD-10-CM | POA: Diagnosis not present

## 2020-06-02 DIAGNOSIS — M5020 Other cervical disc displacement, unspecified cervical region: Secondary | ICD-10-CM | POA: Diagnosis not present

## 2020-06-02 DIAGNOSIS — E785 Hyperlipidemia, unspecified: Secondary | ICD-10-CM | POA: Diagnosis not present

## 2020-06-02 DIAGNOSIS — C9110 Chronic lymphocytic leukemia of B-cell type not having achieved remission: Secondary | ICD-10-CM | POA: Diagnosis not present

## 2020-06-02 DIAGNOSIS — M4312 Spondylolisthesis, cervical region: Secondary | ICD-10-CM | POA: Diagnosis not present

## 2020-06-02 DIAGNOSIS — E538 Deficiency of other specified B group vitamins: Secondary | ICD-10-CM | POA: Diagnosis not present

## 2020-06-02 DIAGNOSIS — M19042 Primary osteoarthritis, left hand: Secondary | ICD-10-CM | POA: Diagnosis not present

## 2020-06-02 DIAGNOSIS — I10 Essential (primary) hypertension: Secondary | ICD-10-CM | POA: Diagnosis not present

## 2020-06-02 DIAGNOSIS — G319 Degenerative disease of nervous system, unspecified: Secondary | ICD-10-CM | POA: Diagnosis not present

## 2020-06-02 DIAGNOSIS — N1831 Chronic kidney disease, stage 3a: Secondary | ICD-10-CM | POA: Diagnosis not present

## 2020-06-02 DIAGNOSIS — K449 Diaphragmatic hernia without obstruction or gangrene: Secondary | ICD-10-CM | POA: Diagnosis not present

## 2020-06-02 DIAGNOSIS — M19041 Primary osteoarthritis, right hand: Secondary | ICD-10-CM | POA: Diagnosis not present

## 2020-06-02 DIAGNOSIS — N390 Urinary tract infection, site not specified: Secondary | ICD-10-CM | POA: Diagnosis not present

## 2020-06-02 DIAGNOSIS — M4802 Spinal stenosis, cervical region: Secondary | ICD-10-CM | POA: Diagnosis not present

## 2020-06-02 DIAGNOSIS — M2578 Osteophyte, vertebrae: Secondary | ICD-10-CM | POA: Diagnosis not present

## 2020-06-02 DIAGNOSIS — K529 Noninfective gastroenteritis and colitis, unspecified: Secondary | ICD-10-CM | POA: Diagnosis not present

## 2020-06-02 DIAGNOSIS — M1711 Unilateral primary osteoarthritis, right knee: Secondary | ICD-10-CM | POA: Diagnosis not present

## 2020-06-02 DIAGNOSIS — I700 Atherosclerosis of aorta: Secondary | ICD-10-CM | POA: Diagnosis not present

## 2020-06-05 DIAGNOSIS — N390 Urinary tract infection, site not specified: Secondary | ICD-10-CM | POA: Diagnosis not present

## 2020-06-05 DIAGNOSIS — G9341 Metabolic encephalopathy: Secondary | ICD-10-CM | POA: Diagnosis not present

## 2020-06-05 DIAGNOSIS — I129 Hypertensive chronic kidney disease with stage 1 through stage 4 chronic kidney disease, or unspecified chronic kidney disease: Secondary | ICD-10-CM | POA: Diagnosis not present

## 2020-06-05 DIAGNOSIS — F0390 Unspecified dementia without behavioral disturbance: Secondary | ICD-10-CM | POA: Diagnosis not present

## 2020-06-05 DIAGNOSIS — N1831 Chronic kidney disease, stage 3a: Secondary | ICD-10-CM | POA: Diagnosis not present

## 2020-06-05 DIAGNOSIS — C9110 Chronic lymphocytic leukemia of B-cell type not having achieved remission: Secondary | ICD-10-CM | POA: Diagnosis not present

## 2020-06-06 ENCOUNTER — Telehealth: Payer: Self-pay

## 2020-06-06 DIAGNOSIS — N390 Urinary tract infection, site not specified: Secondary | ICD-10-CM | POA: Diagnosis not present

## 2020-06-06 DIAGNOSIS — N1831 Chronic kidney disease, stage 3a: Secondary | ICD-10-CM | POA: Diagnosis not present

## 2020-06-06 DIAGNOSIS — C9110 Chronic lymphocytic leukemia of B-cell type not having achieved remission: Secondary | ICD-10-CM | POA: Diagnosis not present

## 2020-06-06 DIAGNOSIS — I129 Hypertensive chronic kidney disease with stage 1 through stage 4 chronic kidney disease, or unspecified chronic kidney disease: Secondary | ICD-10-CM | POA: Diagnosis not present

## 2020-06-06 DIAGNOSIS — F0390 Unspecified dementia without behavioral disturbance: Secondary | ICD-10-CM | POA: Diagnosis not present

## 2020-06-06 DIAGNOSIS — G9341 Metabolic encephalopathy: Secondary | ICD-10-CM | POA: Diagnosis not present

## 2020-06-06 NOTE — Telephone Encounter (Signed)
12:40PM: Palliative care SW outreached patient to schedule initial visit.   Patients son, [name], answered the phone. Visit scheduled for Mon 12/20 @11am  with RN and SW.

## 2020-06-07 DIAGNOSIS — N390 Urinary tract infection, site not specified: Secondary | ICD-10-CM | POA: Diagnosis not present

## 2020-06-07 DIAGNOSIS — I129 Hypertensive chronic kidney disease with stage 1 through stage 4 chronic kidney disease, or unspecified chronic kidney disease: Secondary | ICD-10-CM | POA: Diagnosis not present

## 2020-06-07 DIAGNOSIS — C9110 Chronic lymphocytic leukemia of B-cell type not having achieved remission: Secondary | ICD-10-CM | POA: Diagnosis not present

## 2020-06-07 DIAGNOSIS — F0390 Unspecified dementia without behavioral disturbance: Secondary | ICD-10-CM | POA: Diagnosis not present

## 2020-06-07 DIAGNOSIS — G9341 Metabolic encephalopathy: Secondary | ICD-10-CM | POA: Diagnosis not present

## 2020-06-07 DIAGNOSIS — N1831 Chronic kidney disease, stage 3a: Secondary | ICD-10-CM | POA: Diagnosis not present

## 2020-06-08 DIAGNOSIS — F0390 Unspecified dementia without behavioral disturbance: Secondary | ICD-10-CM | POA: Diagnosis not present

## 2020-06-08 DIAGNOSIS — N1831 Chronic kidney disease, stage 3a: Secondary | ICD-10-CM | POA: Diagnosis not present

## 2020-06-08 DIAGNOSIS — G9341 Metabolic encephalopathy: Secondary | ICD-10-CM | POA: Diagnosis not present

## 2020-06-08 DIAGNOSIS — N390 Urinary tract infection, site not specified: Secondary | ICD-10-CM | POA: Diagnosis not present

## 2020-06-08 DIAGNOSIS — I129 Hypertensive chronic kidney disease with stage 1 through stage 4 chronic kidney disease, or unspecified chronic kidney disease: Secondary | ICD-10-CM | POA: Diagnosis not present

## 2020-06-08 DIAGNOSIS — C9110 Chronic lymphocytic leukemia of B-cell type not having achieved remission: Secondary | ICD-10-CM | POA: Diagnosis not present

## 2020-06-10 DIAGNOSIS — I129 Hypertensive chronic kidney disease with stage 1 through stage 4 chronic kidney disease, or unspecified chronic kidney disease: Secondary | ICD-10-CM | POA: Diagnosis not present

## 2020-06-10 DIAGNOSIS — C9110 Chronic lymphocytic leukemia of B-cell type not having achieved remission: Secondary | ICD-10-CM | POA: Diagnosis not present

## 2020-06-10 DIAGNOSIS — N1831 Chronic kidney disease, stage 3a: Secondary | ICD-10-CM | POA: Diagnosis not present

## 2020-06-10 DIAGNOSIS — F0390 Unspecified dementia without behavioral disturbance: Secondary | ICD-10-CM | POA: Diagnosis not present

## 2020-06-10 DIAGNOSIS — G9341 Metabolic encephalopathy: Secondary | ICD-10-CM | POA: Diagnosis not present

## 2020-06-10 DIAGNOSIS — N390 Urinary tract infection, site not specified: Secondary | ICD-10-CM | POA: Diagnosis not present

## 2020-06-12 ENCOUNTER — Other Ambulatory Visit: Payer: Self-pay

## 2020-06-12 ENCOUNTER — Other Ambulatory Visit: Payer: Medicare Other

## 2020-06-12 VITALS — BP 122/80 | HR 74 | Temp 97.2°F | Resp 20

## 2020-06-12 DIAGNOSIS — I129 Hypertensive chronic kidney disease with stage 1 through stage 4 chronic kidney disease, or unspecified chronic kidney disease: Secondary | ICD-10-CM | POA: Diagnosis not present

## 2020-06-12 DIAGNOSIS — M2041 Other hammer toe(s) (acquired), right foot: Secondary | ICD-10-CM | POA: Diagnosis not present

## 2020-06-12 DIAGNOSIS — R609 Edema, unspecified: Secondary | ICD-10-CM | POA: Diagnosis not present

## 2020-06-12 DIAGNOSIS — C9110 Chronic lymphocytic leukemia of B-cell type not having achieved remission: Secondary | ICD-10-CM | POA: Diagnosis not present

## 2020-06-12 DIAGNOSIS — G9340 Encephalopathy, unspecified: Secondary | ICD-10-CM | POA: Diagnosis not present

## 2020-06-12 DIAGNOSIS — L602 Onychogryphosis: Secondary | ICD-10-CM | POA: Diagnosis not present

## 2020-06-12 DIAGNOSIS — N183 Chronic kidney disease, stage 3 unspecified: Secondary | ICD-10-CM | POA: Diagnosis not present

## 2020-06-12 DIAGNOSIS — N390 Urinary tract infection, site not specified: Secondary | ICD-10-CM | POA: Diagnosis not present

## 2020-06-12 DIAGNOSIS — M170 Bilateral primary osteoarthritis of knee: Secondary | ICD-10-CM | POA: Diagnosis not present

## 2020-06-12 DIAGNOSIS — N1831 Chronic kidney disease, stage 3a: Secondary | ICD-10-CM | POA: Diagnosis not present

## 2020-06-12 DIAGNOSIS — Z515 Encounter for palliative care: Secondary | ICD-10-CM

## 2020-06-12 DIAGNOSIS — M2042 Other hammer toe(s) (acquired), left foot: Secondary | ICD-10-CM | POA: Diagnosis not present

## 2020-06-12 DIAGNOSIS — G9341 Metabolic encephalopathy: Secondary | ICD-10-CM | POA: Diagnosis not present

## 2020-06-12 DIAGNOSIS — F0390 Unspecified dementia without behavioral disturbance: Secondary | ICD-10-CM | POA: Diagnosis not present

## 2020-06-12 DIAGNOSIS — R413 Other amnesia: Secondary | ICD-10-CM | POA: Diagnosis not present

## 2020-06-12 NOTE — Progress Notes (Signed)
PATIENT NAME: Donna Burch DOB: Jan 04, 1927 MRN: [id number]  PRIMARY CARE PROVIDER: Harlan Stains, MD  RESPONSIBLE PARTY:  Acct ID - Guarantor Home Phone Work Phone Relationship Acct Type  [id number] - [phone]  Self P/F     [address], [name], [place] [address]    PLAN OF CARE and INTERVENTIONS:               1.  GOALS OF CARE/ ADVANCE CARE PLANNING:  Remain home with 24/7 care.               2.  PATIENT/CAREGIVER EDUCATION:  Palliative Care Services               3. PERSONAL EMERGENCY PLAN:  Activate 911 for emergencies.               4.  DISEASE STATUS:  Joint visit with Donna Burch, Donna Burch.  Meet with patient's 2 sons and private caregiver.  Son Donna Burch is able to provide background on patient.  Patient sits quietly on the sofa looking at her papers.  Minimal participation with this visit.  Patient lived independently prior to her hospitalization this month.  Patient previously used a cane.  Patient is currently working with [facility] for therapy.  She is currently using a walker but requires extensive assistance with adl's.  Patient has a chronic history of bilateral knee pain.  She was previously getting cortisone injections 4 x a year.  Donna Burch states that patient will have some relief for about a week and then it returns.  Patient is taking naps during the day per Donna Burch-sitter. Patient typically sleeps well during the night.  There have been 2 episodes of patient getting out of the bed unassisted.  Patient typically goes to bed around 8 pm and awakens around 8 am.  Patient's appetite has increased since her hospitalizations.  She is now eating 3 meals a day.  Liquid intake has been good.  Patient is drinking a Glucerna daily.  She keeps a cup of flavored water beside her and sips on this throughout the day.  Patient is continent of bowel and bladder.  No depends are bing used.  Patient is able to let caregivers know when she needs assistance with toileting.  Bowel movements have  been regular.  Son Donna Burch notes some tearful behaviors as the day progresses.  This is fairly new since her recent hospitalization.  Prior to the hospitalization patient was taking medications and missing doses frequently.  A pill box is now being completed by family.  Patient is scheduled to see Dr. Dema Burch today.  Son will address tearful behaviors and knee pain on this visit.    HISTORY OF PRESENT ILLNESS:  86 year old female with hx of Leukemia and frequent UTI's.  Patient is being followed by Palliative Care monthly and PRN.  CODE STATUS: DNR-posted on the fridge. ADVANCED DIRECTIVES: No MOST FORM: Yes PPS: 40%   PHYSICAL EXAM:   VITALS:  Temp 97.2 F BP 122/80 P 74 R 20 O2 Sats 94% LUNGS:  Diminished breath sounds CARDIAC: HRR EXTREMITIES: No edema SKIN: Warm and dry to touch.  No skin breakdown is noted.  Patient previously had a rash on her left thigh but this is resolved. NEURO: alert and oriented to self.  Has difficulty on occasion of recognizing family members.       Donna Burton, RN

## 2020-06-12 NOTE — Progress Notes (Signed)
COMMUNITY PALLIATIVE CARE SW NOTE  PATIENT NAME: Donna Burch DOB: 1926-07-16 MRN: [id number]  PRIMARY CARE PROVIDER: Harlan Stains, MD  RESPONSIBLE PARTY:  Acct ID - Guarantor Home Phone Work Phone Relationship Acct Type  [id number] - [phone]  Self P/F     [address], [name], [place] [address]     PLAN OF CARE and INTERVENTIONS:             1. GOALS OF CARE/ ADVANCE CARE PLANNING: Patient is a DNR. MOST form completed. Patients daughter is POA. Patient has living will. Patient's goal is to remain at home as independent as possible. 2.         SOCIAL/EMOTIONAL/SPIRITUAL ASSESSMENT/ INTERVENTIONS:  SW and RN Almyra Free met with patient in patients home for initial visit. Son, [name], present for visit. Patient lives in a one story home alone but now has caregivers Helene Kelp and [place]) M-F 0700-5pm, and children rotate sifts in the evening and weekends. Patient son updated SW and RN medical condition and changes. Patient had recent hospitalization due to disorientation (Acute encephalopathy). Patient is receiving [place] PT/OT by Alvis Lemmings. No falls reported. Patient eats well, nearly three meals a day and drinks glucerna daily. Patient complains of pain in knees. Patient gets injections in knees every 3-4 months. No other pain reported. Sleeping pretty well at night and naps during the day. RN reviewed medications and took vitals. Family prepares pill box. SW reviewed palliative care services. SW discussed goals, reviewed care plan, provided emotional support, used active and reflective listening. SW suggested life alert system and possible baby monitors in the home due patient not always making caregivers and family aware of hen she is ready to get out bed. Palliative care will continue to monitor and assist with long term care planning as needed.  3.         PATIENT/CAREGIVER EDUCATION/ COPING:  Patient A&O, Pt is HOH but able to answer simple questions appropriately. Son states that  patient exhibits signs of depression and having more crying spells since hospitalization. Patient appears to show signs of detachment. Patient was prescribed Mirtazapine by PCP a while ago and Patients family is supportive and open to all care suggestions. 4.         PERSONAL EMERGENCY PLAN:  Patient will call 9-1-1 for emergencies.  5.         COMMUNITY RESOURCES COORDINATION/ HEALTH CARE NAVIGATION:  Patients daughter manages her care. 6.         FINANCIAL/LEGAL CONCERNS/INTERVENTIONS:  None.     SOCIAL HX:  Social History   Tobacco Use  . Smoking status: Never Smoker  . Smokeless tobacco: Never Used  Substance Use Topics  . Alcohol use: No    CODE STATUS: DNR ADVANCED DIRECTIVES: Y MOST FORM COMPLETE:  Y HOSPICE EDUCATION PROVIDED: N  [id number] is ambulatory with RW. Patient is [place] with STS. Patient is MIN-MOD A with toileting assistance and hygiene. Patient is continent. Patient is Min-MOD A with bathing and dressing. Family and caregiver provides meal prep, patient is able to feedself.    Time spent: 1hr.     Doreene Eland, LCSW

## 2020-06-13 DIAGNOSIS — C9110 Chronic lymphocytic leukemia of B-cell type not having achieved remission: Secondary | ICD-10-CM | POA: Diagnosis not present

## 2020-06-13 DIAGNOSIS — N1831 Chronic kidney disease, stage 3a: Secondary | ICD-10-CM | POA: Diagnosis not present

## 2020-06-13 DIAGNOSIS — N390 Urinary tract infection, site not specified: Secondary | ICD-10-CM | POA: Diagnosis not present

## 2020-06-13 DIAGNOSIS — F0390 Unspecified dementia without behavioral disturbance: Secondary | ICD-10-CM | POA: Diagnosis not present

## 2020-06-13 DIAGNOSIS — G9341 Metabolic encephalopathy: Secondary | ICD-10-CM | POA: Diagnosis not present

## 2020-06-13 DIAGNOSIS — I129 Hypertensive chronic kidney disease with stage 1 through stage 4 chronic kidney disease, or unspecified chronic kidney disease: Secondary | ICD-10-CM | POA: Diagnosis not present

## 2020-06-14 DIAGNOSIS — F0390 Unspecified dementia without behavioral disturbance: Secondary | ICD-10-CM | POA: Diagnosis not present

## 2020-06-14 DIAGNOSIS — I129 Hypertensive chronic kidney disease with stage 1 through stage 4 chronic kidney disease, or unspecified chronic kidney disease: Secondary | ICD-10-CM | POA: Diagnosis not present

## 2020-06-14 DIAGNOSIS — G9341 Metabolic encephalopathy: Secondary | ICD-10-CM | POA: Diagnosis not present

## 2020-06-14 DIAGNOSIS — N1831 Chronic kidney disease, stage 3a: Secondary | ICD-10-CM | POA: Diagnosis not present

## 2020-06-14 DIAGNOSIS — C9110 Chronic lymphocytic leukemia of B-cell type not having achieved remission: Secondary | ICD-10-CM | POA: Diagnosis not present

## 2020-06-14 DIAGNOSIS — N390 Urinary tract infection, site not specified: Secondary | ICD-10-CM | POA: Diagnosis not present

## 2020-06-15 DIAGNOSIS — F0390 Unspecified dementia without behavioral disturbance: Secondary | ICD-10-CM | POA: Diagnosis not present

## 2020-06-15 DIAGNOSIS — C9110 Chronic lymphocytic leukemia of B-cell type not having achieved remission: Secondary | ICD-10-CM | POA: Diagnosis not present

## 2020-06-15 DIAGNOSIS — G9341 Metabolic encephalopathy: Secondary | ICD-10-CM | POA: Diagnosis not present

## 2020-06-15 DIAGNOSIS — N1831 Chronic kidney disease, stage 3a: Secondary | ICD-10-CM | POA: Diagnosis not present

## 2020-06-15 DIAGNOSIS — N390 Urinary tract infection, site not specified: Secondary | ICD-10-CM | POA: Diagnosis not present

## 2020-06-15 DIAGNOSIS — I129 Hypertensive chronic kidney disease with stage 1 through stage 4 chronic kidney disease, or unspecified chronic kidney disease: Secondary | ICD-10-CM | POA: Diagnosis not present

## 2020-06-21 DIAGNOSIS — G9341 Metabolic encephalopathy: Secondary | ICD-10-CM | POA: Diagnosis not present

## 2020-06-21 DIAGNOSIS — N1831 Chronic kidney disease, stage 3a: Secondary | ICD-10-CM | POA: Diagnosis not present

## 2020-06-21 DIAGNOSIS — C9110 Chronic lymphocytic leukemia of B-cell type not having achieved remission: Secondary | ICD-10-CM | POA: Diagnosis not present

## 2020-06-21 DIAGNOSIS — N390 Urinary tract infection, site not specified: Secondary | ICD-10-CM | POA: Diagnosis not present

## 2020-06-21 DIAGNOSIS — I129 Hypertensive chronic kidney disease with stage 1 through stage 4 chronic kidney disease, or unspecified chronic kidney disease: Secondary | ICD-10-CM | POA: Diagnosis not present

## 2020-06-21 DIAGNOSIS — F0390 Unspecified dementia without behavioral disturbance: Secondary | ICD-10-CM | POA: Diagnosis not present

## 2020-06-23 DIAGNOSIS — G9341 Metabolic encephalopathy: Secondary | ICD-10-CM | POA: Diagnosis not present

## 2020-06-23 DIAGNOSIS — C9110 Chronic lymphocytic leukemia of B-cell type not having achieved remission: Secondary | ICD-10-CM | POA: Diagnosis not present

## 2020-06-23 DIAGNOSIS — F0390 Unspecified dementia without behavioral disturbance: Secondary | ICD-10-CM | POA: Diagnosis not present

## 2020-06-23 DIAGNOSIS — N1831 Chronic kidney disease, stage 3a: Secondary | ICD-10-CM | POA: Diagnosis not present

## 2020-06-23 DIAGNOSIS — N390 Urinary tract infection, site not specified: Secondary | ICD-10-CM | POA: Diagnosis not present

## 2020-06-23 DIAGNOSIS — I129 Hypertensive chronic kidney disease with stage 1 through stage 4 chronic kidney disease, or unspecified chronic kidney disease: Secondary | ICD-10-CM | POA: Diagnosis not present

## 2020-06-26 DIAGNOSIS — F0390 Unspecified dementia without behavioral disturbance: Secondary | ICD-10-CM | POA: Diagnosis not present

## 2020-06-26 DIAGNOSIS — N390 Urinary tract infection, site not specified: Secondary | ICD-10-CM | POA: Diagnosis not present

## 2020-06-26 DIAGNOSIS — N1831 Chronic kidney disease, stage 3a: Secondary | ICD-10-CM | POA: Diagnosis not present

## 2020-06-26 DIAGNOSIS — I129 Hypertensive chronic kidney disease with stage 1 through stage 4 chronic kidney disease, or unspecified chronic kidney disease: Secondary | ICD-10-CM | POA: Diagnosis not present

## 2020-06-26 DIAGNOSIS — G9341 Metabolic encephalopathy: Secondary | ICD-10-CM | POA: Diagnosis not present

## 2020-06-26 DIAGNOSIS — C9110 Chronic lymphocytic leukemia of B-cell type not having achieved remission: Secondary | ICD-10-CM | POA: Diagnosis not present

## 2020-06-28 DIAGNOSIS — N390 Urinary tract infection, site not specified: Secondary | ICD-10-CM | POA: Diagnosis not present

## 2020-06-28 DIAGNOSIS — F0390 Unspecified dementia without behavioral disturbance: Secondary | ICD-10-CM | POA: Diagnosis not present

## 2020-06-28 DIAGNOSIS — G9341 Metabolic encephalopathy: Secondary | ICD-10-CM | POA: Diagnosis not present

## 2020-06-28 DIAGNOSIS — I129 Hypertensive chronic kidney disease with stage 1 through stage 4 chronic kidney disease, or unspecified chronic kidney disease: Secondary | ICD-10-CM | POA: Diagnosis not present

## 2020-06-28 DIAGNOSIS — N1831 Chronic kidney disease, stage 3a: Secondary | ICD-10-CM | POA: Diagnosis not present

## 2020-06-28 DIAGNOSIS — C9110 Chronic lymphocytic leukemia of B-cell type not having achieved remission: Secondary | ICD-10-CM | POA: Diagnosis not present

## 2020-07-02 DIAGNOSIS — M19041 Primary osteoarthritis, right hand: Secondary | ICD-10-CM | POA: Diagnosis not present

## 2020-07-02 DIAGNOSIS — K219 Gastro-esophageal reflux disease without esophagitis: Secondary | ICD-10-CM | POA: Diagnosis not present

## 2020-07-02 DIAGNOSIS — M4802 Spinal stenosis, cervical region: Secondary | ICD-10-CM | POA: Diagnosis not present

## 2020-07-02 DIAGNOSIS — M4314 Spondylolisthesis, thoracic region: Secondary | ICD-10-CM | POA: Diagnosis not present

## 2020-07-02 DIAGNOSIS — M5020 Other cervical disc displacement, unspecified cervical region: Secondary | ICD-10-CM | POA: Diagnosis not present

## 2020-07-02 DIAGNOSIS — M4312 Spondylolisthesis, cervical region: Secondary | ICD-10-CM | POA: Diagnosis not present

## 2020-07-02 DIAGNOSIS — I700 Atherosclerosis of aorta: Secondary | ICD-10-CM | POA: Diagnosis not present

## 2020-07-02 DIAGNOSIS — E538 Deficiency of other specified B group vitamins: Secondary | ICD-10-CM | POA: Diagnosis not present

## 2020-07-02 DIAGNOSIS — M2578 Osteophyte, vertebrae: Secondary | ICD-10-CM | POA: Diagnosis not present

## 2020-07-02 DIAGNOSIS — M1711 Unilateral primary osteoarthritis, right knee: Secondary | ICD-10-CM | POA: Diagnosis not present

## 2020-07-02 DIAGNOSIS — E785 Hyperlipidemia, unspecified: Secondary | ICD-10-CM | POA: Diagnosis not present

## 2020-07-02 DIAGNOSIS — F0390 Unspecified dementia without behavioral disturbance: Secondary | ICD-10-CM | POA: Diagnosis not present

## 2020-07-02 DIAGNOSIS — M160 Bilateral primary osteoarthritis of hip: Secondary | ICD-10-CM | POA: Diagnosis not present

## 2020-07-02 DIAGNOSIS — G319 Degenerative disease of nervous system, unspecified: Secondary | ICD-10-CM | POA: Diagnosis not present

## 2020-07-02 DIAGNOSIS — N390 Urinary tract infection, site not specified: Secondary | ICD-10-CM | POA: Diagnosis not present

## 2020-07-02 DIAGNOSIS — I129 Hypertensive chronic kidney disease with stage 1 through stage 4 chronic kidney disease, or unspecified chronic kidney disease: Secondary | ICD-10-CM | POA: Diagnosis not present

## 2020-07-02 DIAGNOSIS — N1831 Chronic kidney disease, stage 3a: Secondary | ICD-10-CM | POA: Diagnosis not present

## 2020-07-02 DIAGNOSIS — C9110 Chronic lymphocytic leukemia of B-cell type not having achieved remission: Secondary | ICD-10-CM | POA: Diagnosis not present

## 2020-07-02 DIAGNOSIS — K529 Noninfective gastroenteritis and colitis, unspecified: Secondary | ICD-10-CM | POA: Diagnosis not present

## 2020-07-02 DIAGNOSIS — M19042 Primary osteoarthritis, left hand: Secondary | ICD-10-CM | POA: Diagnosis not present

## 2020-07-02 DIAGNOSIS — K449 Diaphragmatic hernia without obstruction or gangrene: Secondary | ICD-10-CM | POA: Diagnosis not present

## 2020-07-02 DIAGNOSIS — M47812 Spondylosis without myelopathy or radiculopathy, cervical region: Secondary | ICD-10-CM | POA: Diagnosis not present

## 2020-07-02 DIAGNOSIS — K8020 Calculus of gallbladder without cholecystitis without obstruction: Secondary | ICD-10-CM | POA: Diagnosis not present

## 2020-07-02 DIAGNOSIS — G9341 Metabolic encephalopathy: Secondary | ICD-10-CM | POA: Diagnosis not present

## 2020-07-03 DIAGNOSIS — I129 Hypertensive chronic kidney disease with stage 1 through stage 4 chronic kidney disease, or unspecified chronic kidney disease: Secondary | ICD-10-CM | POA: Diagnosis not present

## 2020-07-03 DIAGNOSIS — G9341 Metabolic encephalopathy: Secondary | ICD-10-CM | POA: Diagnosis not present

## 2020-07-03 DIAGNOSIS — N390 Urinary tract infection, site not specified: Secondary | ICD-10-CM | POA: Diagnosis not present

## 2020-07-03 DIAGNOSIS — C9110 Chronic lymphocytic leukemia of B-cell type not having achieved remission: Secondary | ICD-10-CM | POA: Diagnosis not present

## 2020-07-03 DIAGNOSIS — N1831 Chronic kidney disease, stage 3a: Secondary | ICD-10-CM | POA: Diagnosis not present

## 2020-07-03 DIAGNOSIS — F0390 Unspecified dementia without behavioral disturbance: Secondary | ICD-10-CM | POA: Diagnosis not present

## 2020-07-05 DIAGNOSIS — F0390 Unspecified dementia without behavioral disturbance: Secondary | ICD-10-CM | POA: Diagnosis not present

## 2020-07-05 DIAGNOSIS — I129 Hypertensive chronic kidney disease with stage 1 through stage 4 chronic kidney disease, or unspecified chronic kidney disease: Secondary | ICD-10-CM | POA: Diagnosis not present

## 2020-07-05 DIAGNOSIS — G9341 Metabolic encephalopathy: Secondary | ICD-10-CM | POA: Diagnosis not present

## 2020-07-05 DIAGNOSIS — C9110 Chronic lymphocytic leukemia of B-cell type not having achieved remission: Secondary | ICD-10-CM | POA: Diagnosis not present

## 2020-07-05 DIAGNOSIS — N1831 Chronic kidney disease, stage 3a: Secondary | ICD-10-CM | POA: Diagnosis not present

## 2020-07-05 DIAGNOSIS — N390 Urinary tract infection, site not specified: Secondary | ICD-10-CM | POA: Diagnosis not present

## 2020-07-07 DIAGNOSIS — F0390 Unspecified dementia without behavioral disturbance: Secondary | ICD-10-CM | POA: Diagnosis not present

## 2020-07-07 DIAGNOSIS — G9341 Metabolic encephalopathy: Secondary | ICD-10-CM | POA: Diagnosis not present

## 2020-07-07 DIAGNOSIS — I129 Hypertensive chronic kidney disease with stage 1 through stage 4 chronic kidney disease, or unspecified chronic kidney disease: Secondary | ICD-10-CM | POA: Diagnosis not present

## 2020-07-07 DIAGNOSIS — N1831 Chronic kidney disease, stage 3a: Secondary | ICD-10-CM | POA: Diagnosis not present

## 2020-07-07 DIAGNOSIS — C9110 Chronic lymphocytic leukemia of B-cell type not having achieved remission: Secondary | ICD-10-CM | POA: Diagnosis not present

## 2020-07-07 DIAGNOSIS — N390 Urinary tract infection, site not specified: Secondary | ICD-10-CM | POA: Diagnosis not present

## 2020-07-11 DIAGNOSIS — N1831 Chronic kidney disease, stage 3a: Secondary | ICD-10-CM | POA: Diagnosis not present

## 2020-07-11 DIAGNOSIS — K219 Gastro-esophageal reflux disease without esophagitis: Secondary | ICD-10-CM | POA: Diagnosis not present

## 2020-07-11 DIAGNOSIS — E785 Hyperlipidemia, unspecified: Secondary | ICD-10-CM | POA: Diagnosis not present

## 2020-07-11 DIAGNOSIS — E890 Postprocedural hypothyroidism: Secondary | ICD-10-CM | POA: Diagnosis not present

## 2020-07-11 DIAGNOSIS — F0390 Unspecified dementia without behavioral disturbance: Secondary | ICD-10-CM | POA: Diagnosis not present

## 2020-07-11 DIAGNOSIS — I129 Hypertensive chronic kidney disease with stage 1 through stage 4 chronic kidney disease, or unspecified chronic kidney disease: Secondary | ICD-10-CM | POA: Diagnosis not present

## 2020-07-11 DIAGNOSIS — M170 Bilateral primary osteoarthritis of knee: Secondary | ICD-10-CM | POA: Diagnosis not present

## 2020-07-11 DIAGNOSIS — M47812 Spondylosis without myelopathy or radiculopathy, cervical region: Secondary | ICD-10-CM | POA: Diagnosis not present

## 2020-07-11 DIAGNOSIS — E039 Hypothyroidism, unspecified: Secondary | ICD-10-CM | POA: Diagnosis not present

## 2020-07-11 DIAGNOSIS — M1990 Unspecified osteoarthritis, unspecified site: Secondary | ICD-10-CM | POA: Diagnosis not present

## 2020-07-11 DIAGNOSIS — M810 Age-related osteoporosis without current pathological fracture: Secondary | ICD-10-CM | POA: Diagnosis not present

## 2020-07-12 DIAGNOSIS — G9341 Metabolic encephalopathy: Secondary | ICD-10-CM | POA: Diagnosis not present

## 2020-07-12 DIAGNOSIS — N1831 Chronic kidney disease, stage 3a: Secondary | ICD-10-CM | POA: Diagnosis not present

## 2020-07-12 DIAGNOSIS — C9110 Chronic lymphocytic leukemia of B-cell type not having achieved remission: Secondary | ICD-10-CM | POA: Diagnosis not present

## 2020-07-12 DIAGNOSIS — F0390 Unspecified dementia without behavioral disturbance: Secondary | ICD-10-CM | POA: Diagnosis not present

## 2020-07-12 DIAGNOSIS — I129 Hypertensive chronic kidney disease with stage 1 through stage 4 chronic kidney disease, or unspecified chronic kidney disease: Secondary | ICD-10-CM | POA: Diagnosis not present

## 2020-07-12 DIAGNOSIS — N390 Urinary tract infection, site not specified: Secondary | ICD-10-CM | POA: Diagnosis not present

## 2020-07-14 ENCOUNTER — Telehealth: Payer: Self-pay

## 2020-07-14 DIAGNOSIS — I129 Hypertensive chronic kidney disease with stage 1 through stage 4 chronic kidney disease, or unspecified chronic kidney disease: Secondary | ICD-10-CM | POA: Diagnosis not present

## 2020-07-14 DIAGNOSIS — N1831 Chronic kidney disease, stage 3a: Secondary | ICD-10-CM | POA: Diagnosis not present

## 2020-07-14 DIAGNOSIS — F0390 Unspecified dementia without behavioral disturbance: Secondary | ICD-10-CM | POA: Diagnosis not present

## 2020-07-14 DIAGNOSIS — C9110 Chronic lymphocytic leukemia of B-cell type not having achieved remission: Secondary | ICD-10-CM | POA: Diagnosis not present

## 2020-07-14 DIAGNOSIS — G9341 Metabolic encephalopathy: Secondary | ICD-10-CM | POA: Diagnosis not present

## 2020-07-14 DIAGNOSIS — N390 Urinary tract infection, site not specified: Secondary | ICD-10-CM | POA: Diagnosis not present

## 2020-07-14 NOTE — Telephone Encounter (Signed)
Phone call made to patient's home to follow up on her overall condition.  Patient answered the phone.  I introduced myself to patient and reminded her of my visit last month.  Patient states she is doing fine and does not need my assistance and requested that I not call back.  Patient states her female friend is in the home with her and assisting.  Female voice could be heard in the background telling patient to hang up the phone.  Phone call made to [name] to follow up on patient's condition.  I have advised of the above phone call.  He states he will be over to see patient this afternoon and will follow up.  Private caregivers are in the home Mon-Fri.  Both sons and daughter are rotating being with the patient.  Daughter is with patient over the weekend and sons are staying at night during the weekdays.    Patient continues to have some tearful episodes.  She was started on remeron.  Son notes patient's appetite has been good.  If she is not very hungry for a meal, she is eating snacks.    There was some concern that patient was getting dehydrated but son does not believe this was the case because patient is drinking liquids well.  A nurse did do a visit but no interventions were needed.  Alvis Lemmings is seeing patient 2x weekly.  Patient continues to be a fall risk due to her cognitive abilities and also chronic bilateral knee pain.   PT has scheduled patient with a podiatrist who will see patient in the home next Tuesday.  Son openly discussed caregiver fatigue.  Allowed active listening as he voiced the struggles of ongoing care.  The family has discussed this with their financial advisor and they are making some adjustments to have additional income should night time caregivers be needed.  Both of patient's sons are dealing with their own health issues and report using a cpap.  They are having issues with insomnia and are concerned about their own health.  In-person visit is scheduled for next month on  2/10 at 1230 pm.

## 2020-07-18 DIAGNOSIS — R609 Edema, unspecified: Secondary | ICD-10-CM | POA: Diagnosis not present

## 2020-07-18 DIAGNOSIS — C9110 Chronic lymphocytic leukemia of B-cell type not having achieved remission: Secondary | ICD-10-CM | POA: Diagnosis not present

## 2020-07-18 DIAGNOSIS — M79674 Pain in right toe(s): Secondary | ICD-10-CM | POA: Diagnosis not present

## 2020-07-18 DIAGNOSIS — B351 Tinea unguium: Secondary | ICD-10-CM | POA: Diagnosis not present

## 2020-07-18 DIAGNOSIS — G9341 Metabolic encephalopathy: Secondary | ICD-10-CM | POA: Diagnosis not present

## 2020-07-18 DIAGNOSIS — F0390 Unspecified dementia without behavioral disturbance: Secondary | ICD-10-CM | POA: Diagnosis not present

## 2020-07-18 DIAGNOSIS — M2042 Other hammer toe(s) (acquired), left foot: Secondary | ICD-10-CM | POA: Diagnosis not present

## 2020-07-18 DIAGNOSIS — M2041 Other hammer toe(s) (acquired), right foot: Secondary | ICD-10-CM | POA: Diagnosis not present

## 2020-07-18 DIAGNOSIS — N1831 Chronic kidney disease, stage 3a: Secondary | ICD-10-CM | POA: Diagnosis not present

## 2020-07-18 DIAGNOSIS — N390 Urinary tract infection, site not specified: Secondary | ICD-10-CM | POA: Diagnosis not present

## 2020-07-18 DIAGNOSIS — I129 Hypertensive chronic kidney disease with stage 1 through stage 4 chronic kidney disease, or unspecified chronic kidney disease: Secondary | ICD-10-CM | POA: Diagnosis not present

## 2020-07-18 DIAGNOSIS — N183 Chronic kidney disease, stage 3 unspecified: Secondary | ICD-10-CM | POA: Diagnosis not present

## 2020-07-26 DIAGNOSIS — F0390 Unspecified dementia without behavioral disturbance: Secondary | ICD-10-CM | POA: Diagnosis not present

## 2020-07-26 DIAGNOSIS — I129 Hypertensive chronic kidney disease with stage 1 through stage 4 chronic kidney disease, or unspecified chronic kidney disease: Secondary | ICD-10-CM | POA: Diagnosis not present

## 2020-07-26 DIAGNOSIS — N1831 Chronic kidney disease, stage 3a: Secondary | ICD-10-CM | POA: Diagnosis not present

## 2020-07-26 DIAGNOSIS — C9110 Chronic lymphocytic leukemia of B-cell type not having achieved remission: Secondary | ICD-10-CM | POA: Diagnosis not present

## 2020-07-26 DIAGNOSIS — G9341 Metabolic encephalopathy: Secondary | ICD-10-CM | POA: Diagnosis not present

## 2020-07-26 DIAGNOSIS — N390 Urinary tract infection, site not specified: Secondary | ICD-10-CM | POA: Diagnosis not present

## 2020-08-03 ENCOUNTER — Other Ambulatory Visit: Payer: Self-pay

## 2020-08-03 ENCOUNTER — Other Ambulatory Visit: Payer: Medicare Other

## 2020-08-03 VITALS — BP 146/68 | HR 80 | Temp 97.0°F | Resp 18 | Wt 119.0 lb

## 2020-08-03 DIAGNOSIS — Z515 Encounter for palliative care: Secondary | ICD-10-CM

## 2020-08-03 NOTE — Progress Notes (Unsigned)
PATIENT NAME: Donna Burch DOB: February 04, 1927 MRN: [id number]  PRIMARY CARE PROVIDER: Harlan Stains, MD  RESPONSIBLE PARTY:  Acct ID - Guarantor Home Phone Work Phone Relationship Acct Type  [id number] - [phone]  Self P/F     [address], [name], [place] [address]    PLAN OF CARE and INTERVENTIONS:               1.  GOALS OF CARE/ ADVANCE CARE PLANNING:  Remain home with private caregivers and assistance of 3 children.               2.  PATIENT/CAREGIVER EDUCATION:  Safety               4. PERSONAL EMERGENCY PLAN:  Activate 911 for emergencies.               5.  DISEASE STATUS:   Patient remains continent of bowel and bladder.  Her caregiver states she is having bowel movements 2 out of the 3 days that she is here.  Po intake remains good.  Patient believes she is eating well.  Caregivers are completing meal preparations.  Patient is eating 3 meals a day.  She does have Ensure when she does not eat well. Last documented weight is from December.   Patient has occasional issues with pain to her bilateral knees.  Patient is using a rolling walker to ambulate. No falls are reported.  Patient has completed physical therapy last week.  Her caregiver is having patient ambulate throughout the home for exercises.  She does report some shortness of breath after doing 2 rounds in the house.  Safety education provided.  Patient's 2 sons are staying with patient in home during the night.  She has private sitters during the day.  Her daughter is staying with her on the weekends.  Patient requires assistance with grooming and hygiene.  Caregiver states she often needs cues to complete tasks.  She is engaging in conversation but is repetitive.    HISTORY OF PRESENT ILLNESS:  87 year old female with HTN and memory deficits.  Patient is being followed by Palliative Care monthly and PRN.  CODE STATUS: DNR ADVANCED DIRECTIVES: N MOST FORM: Yes PPS: 50%   PHYSICAL EXAM:   VITALS:   Temp 97.0 F, BP 146/68, P 80 R 18 O2 sats 96% LUNGS: CTA CARDIAC: HRR EXTREMITIES: Trace edema to bilateral lower extremities. SKIN: warm and dry to touch.  No skin breakdown reported. NEURO: alert and oriented to self and location.  She recongizes faces but does not always recall names.  Conversation is repetitve.  Patient is frequently talking about her friend who's husband recently passed away.        Lorenza Burton, RN

## 2020-08-15 DIAGNOSIS — M25562 Pain in left knee: Secondary | ICD-10-CM | POA: Diagnosis not present

## 2020-08-15 DIAGNOSIS — M1712 Unilateral primary osteoarthritis, left knee: Secondary | ICD-10-CM | POA: Diagnosis not present

## 2020-08-15 DIAGNOSIS — M67912 Unspecified disorder of synovium and tendon, left shoulder: Secondary | ICD-10-CM | POA: Diagnosis not present

## 2020-09-04 DIAGNOSIS — E785 Hyperlipidemia, unspecified: Secondary | ICD-10-CM | POA: Diagnosis not present

## 2020-09-04 DIAGNOSIS — E039 Hypothyroidism, unspecified: Secondary | ICD-10-CM | POA: Diagnosis not present

## 2020-09-04 DIAGNOSIS — Z23 Encounter for immunization: Secondary | ICD-10-CM | POA: Diagnosis not present

## 2020-09-04 DIAGNOSIS — E559 Vitamin D deficiency, unspecified: Secondary | ICD-10-CM | POA: Diagnosis not present

## 2020-09-04 DIAGNOSIS — N1831 Chronic kidney disease, stage 3a: Secondary | ICD-10-CM | POA: Diagnosis not present

## 2020-09-04 DIAGNOSIS — F0391 Unspecified dementia with behavioral disturbance: Secondary | ICD-10-CM | POA: Diagnosis not present

## 2020-09-04 DIAGNOSIS — I129 Hypertensive chronic kidney disease with stage 1 through stage 4 chronic kidney disease, or unspecified chronic kidney disease: Secondary | ICD-10-CM | POA: Diagnosis not present

## 2020-09-14 ENCOUNTER — Telehealth: Payer: Self-pay

## 2020-09-14 NOTE — Telephone Encounter (Signed)
1113 am.  Telephone call made to Donna Burch to follow up on patient's overall condition since my last visit and to schedule a home visit.  No answer but HIPPA compliant message has been left requesting a call back.  PLAN:  Awaiting call back.  If no call back, Palliative Care will outreach son next month.

## 2020-09-23 ENCOUNTER — Emergency Department (HOSPITAL_COMMUNITY): Payer: Medicare Other

## 2020-09-23 ENCOUNTER — Emergency Department (HOSPITAL_COMMUNITY)
Admission: EM | Admit: 2020-09-23 | Discharge: 2020-09-23 | Disposition: A | Discharge status: Home / Self Care | Payer: Medicare Other | Attending: Emergency Medicine | Admitting: Emergency Medicine

## 2020-09-23 DIAGNOSIS — F29 Unspecified psychosis not due to a substance or known physiological condition: Secondary | ICD-10-CM | POA: Diagnosis not present

## 2020-09-23 DIAGNOSIS — I6782 Cerebral ischemia: Secondary | ICD-10-CM | POA: Diagnosis not present

## 2020-09-23 DIAGNOSIS — R079 Chest pain, unspecified: Secondary | ICD-10-CM | POA: Diagnosis not present

## 2020-09-23 DIAGNOSIS — F0390 Unspecified dementia without behavioral disturbance: Secondary | ICD-10-CM | POA: Diagnosis not present

## 2020-09-23 DIAGNOSIS — Z85828 Personal history of other malignant neoplasm of skin: Secondary | ICD-10-CM | POA: Diagnosis not present

## 2020-09-23 DIAGNOSIS — Z20822 Contact with and (suspected) exposure to covid-19: Secondary | ICD-10-CM | POA: Insufficient documentation

## 2020-09-23 DIAGNOSIS — E039 Hypothyroidism, unspecified: Secondary | ICD-10-CM | POA: Diagnosis not present

## 2020-09-23 DIAGNOSIS — R42 Dizziness and giddiness: Secondary | ICD-10-CM | POA: Diagnosis not present

## 2020-09-23 DIAGNOSIS — I129 Hypertensive chronic kidney disease with stage 1 through stage 4 chronic kidney disease, or unspecified chronic kidney disease: Secondary | ICD-10-CM | POA: Diagnosis not present

## 2020-09-23 DIAGNOSIS — R4182 Altered mental status, unspecified: Secondary | ICD-10-CM | POA: Diagnosis not present

## 2020-09-23 DIAGNOSIS — N183 Chronic kidney disease, stage 3 unspecified: Secondary | ICD-10-CM | POA: Insufficient documentation

## 2020-09-23 DIAGNOSIS — R9082 White matter disease, unspecified: Secondary | ICD-10-CM | POA: Insufficient documentation

## 2020-09-23 DIAGNOSIS — R0602 Shortness of breath: Secondary | ICD-10-CM | POA: Insufficient documentation

## 2020-09-23 DIAGNOSIS — Z8249 Family history of ischemic heart disease and other diseases of the circulatory system: Secondary | ICD-10-CM | POA: Diagnosis not present

## 2020-09-23 DIAGNOSIS — R404 Transient alteration of awareness: Secondary | ICD-10-CM | POA: Diagnosis not present

## 2020-09-23 DIAGNOSIS — R0902 Hypoxemia: Secondary | ICD-10-CM | POA: Diagnosis not present

## 2020-09-23 DIAGNOSIS — I10 Essential (primary) hypertension: Secondary | ICD-10-CM | POA: Diagnosis not present

## 2020-09-23 DIAGNOSIS — G319 Degenerative disease of nervous system, unspecified: Secondary | ICD-10-CM | POA: Diagnosis not present

## 2020-09-23 DIAGNOSIS — R000 Tachycardia, unspecified: Secondary | ICD-10-CM | POA: Diagnosis not present

## 2020-09-23 DIAGNOSIS — R0789 Other chest pain: Secondary | ICD-10-CM | POA: Diagnosis not present

## 2020-09-23 DIAGNOSIS — R451 Restlessness and agitation: Secondary | ICD-10-CM | POA: Diagnosis present

## 2020-09-23 LAB — CBC WITH DIFFERENTIAL/PLATELET
Abs Immature Granulocytes: 0.01 10*3/uL (ref 0.00–0.07)
Basophils Absolute: 0 10*3/uL (ref 0.0–0.1)
Basophils Relative: 1 %
Eosinophils Absolute: 0.1 10*3/uL (ref 0.0–0.5)
Eosinophils Relative: 1 %
HCT: 37 % (ref 36.0–46.0)
Hemoglobin: 12 g/dL (ref 12.0–15.0)
Immature Granulocytes: 0 %
Lymphocytes Relative: 40 %
Lymphs Abs: 2.6 10*3/uL (ref 0.7–4.0)
MCH: 31.1 pg (ref 26.0–34.0)
MCHC: 32.4 g/dL (ref 30.0–36.0)
MCV: 95.9 fL (ref 80.0–100.0)
Monocytes Absolute: 0.6 10*3/uL (ref 0.1–1.0)
Monocytes Relative: 9 %
Neutro Abs: 3.1 10*3/uL (ref 1.7–7.7)
Neutrophils Relative %: 49 %
Platelets: 197 10*3/uL (ref 150–400)
RBC: 3.86 MIL/uL — ABNORMAL LOW (ref 3.87–5.11)
RDW: 14.1 % (ref 11.5–15.5)
WBC: 6.4 10*3/uL (ref 4.0–10.5)
nRBC: 0 % (ref 0.0–0.2)

## 2020-09-23 LAB — COMPREHENSIVE METABOLIC PANEL
ALT: 18 U/L (ref 0–44)
AST: 29 U/L (ref 15–41)
Albumin: 2.9 g/dL — ABNORMAL LOW (ref 3.5–5.0)
Alkaline Phosphatase: 85 U/L (ref 38–126)
Anion gap: 10 (ref 5–15)
BUN: 32 mg/dL — ABNORMAL HIGH (ref 8–23)
CO2: 20 mmol/L — ABNORMAL LOW (ref 22–32)
Calcium: 9.3 mg/dL (ref 8.9–10.3)
Chloride: 107 mmol/L (ref 98–111)
Creatinine, Ser: 1.08 mg/dL — ABNORMAL HIGH (ref 0.44–1.00)
GFR, Estimated: 48 mL/min — ABNORMAL LOW (ref >60)
Glucose, Bld: 92 mg/dL (ref 70–99)
Potassium: 4 mmol/L (ref 3.5–5.1)
Sodium: 137 mmol/L (ref 135–145)
Total Bilirubin: 0.7 mg/dL (ref 0.3–1.2)
Total Protein: 7.4 g/dL (ref 6.5–8.1)

## 2020-09-23 LAB — ETHANOL: Alcohol, Ethyl (B): <10 mg/dL (ref <10)

## 2020-09-23 LAB — URINALYSIS, ROUTINE W REFLEX MICROSCOPIC
Bilirubin Urine: NEGATIVE
Glucose, UA: NEGATIVE mg/dL
Hgb urine dipstick: NEGATIVE
Ketones, ur: NEGATIVE mg/dL
Leukocytes,Ua: NEGATIVE
Nitrite: NEGATIVE
Protein, ur: NEGATIVE mg/dL
Specific Gravity, Urine: 1.011 (ref 1.005–1.030)
pH: 8 (ref 5.0–8.0)

## 2020-09-23 LAB — RESP PANEL BY RT-PCR (FLU A&B, COVID) ARPGX2
Influenza A by PCR: NEGATIVE
Influenza B by PCR: NEGATIVE
SARS Coronavirus 2 by RT PCR: NEGATIVE

## 2020-09-23 LAB — SALICYLATE LEVEL: Salicylate Lvl: <7 mg/dL — ABNORMAL LOW (ref 7.0–30.0)

## 2020-09-23 LAB — ACETAMINOPHEN LEVEL: Acetaminophen (Tylenol), Serum: <10 ug/mL — ABNORMAL LOW (ref 10–30)

## 2020-09-23 LAB — TROPONIN I (HIGH SENSITIVITY): Troponin I (High Sensitivity): 8 ng/L (ref <18)

## 2020-09-23 MED ORDER — HALOPERIDOL LACTATE 5 MG/ML IJ SOLN
2.0000 mg | Freq: Once | INTRAMUSCULAR | Status: AC
  Administered 2020-09-23: 2 mg via INTRAVENOUS
  Filled 2020-09-23: qty 1

## 2020-09-23 NOTE — ED Notes (Signed)
Hx of Dementia. Daughter reports pt woke up this AM inconsolable and crying. Pt was disoriented and initially was complaining of dizziness. Daughter reports pt has mild dementia but today is different as pt is more confused than her baseline. Continuous cardiac monitoring in place. MD aware of pt DNR papers. Daughter at bedside. EMS gave 324mg  Aspirin en route to ED d/t concern of cardiac issues.

## 2020-09-23 NOTE — ED Notes (Signed)
Patient transported to CT 

## 2020-09-23 NOTE — ED Provider Notes (Signed)
Emergency Department Provider Note   I have reviewed the triage vital signs and the nursing notes.   HISTORY  Chief Complaint Anxiety   HPI Donna Burch is a 87 y.o. female with past medical history reviewed below presents to the emergency department with her daughter after she woke up this morning and was very agitated which is unlike her.  She does have some mild dementia and the daughter states at baseline she has some mild confusion but is overall cooperative and able to handle most of her daily activities.  She is coming from home.  The daughter states that this morning she was complaining of dizziness and that they noticed her blood pressure was very high when they took it.  She became more more agitated and so they presented to the emergency department.  Patient endorses some chest pain to me and occasionally some shortness of breath but she is mainly repeating "I want to die now" and has trouble fully engaging with the history.   Level 5 caveat: AMS  Past Medical History:  Diagnosis Date  . Arthritis    "right middle finger; hands" (01/03/2015)  . CLL (chronic lymphocytic leukemia) ([facility])   . Dyspnea on exertion    Improved with exercise  . Elevated brain natriuretic peptide (BNP) level    Slight  . Family history of adverse reaction to anesthesia    son, Marita Snellen, w/PONV  . Frequent UTI    "used to; none lately" (01/03/2015)  . Gastroenteritis 09/2010   c. dificile.  hospitalized in ICU x 3 days  . GERD (gastroesophageal reflux disease)   . Hiatal hernia   . History of Clostridium difficile ?03/2012   "after taking ATB for one of my UTI's"  . Hyperlipidemia   . Hypertension   . Hypothyroidism   . Nephrolithiasis   . Nocturia    x3  . Pneumonia    Required hospital stay at the time  . PONV (postoperative nausea and vomiting)   . Renal insufficiency   . Seasonal allergies   . Skin cancer    right anterior neck  . Wears glasses     Patient Active Problem List    Diagnosis Date Noted  . Acute encephalopathy 05/25/2020  . Atherosclerosis of aorta ([facility]) 08/24/2018  . Chronic kidney disease, stage III (moderate) ([facility]) 08/24/2018  . Hearing loss of both ears 08/24/2018  . Nontoxic goiter 08/24/2018  . UTI (urinary tract infection) 03/15/2018  . Community acquired pneumonia 03/15/2018  . Syncope and collapse 03/15/2018  . Primary osteoarthritis of both hands 01/09/2018  . Common bile duct calculi   . Gallstone pancreatitis 10/16/2017  . Pancreatitis 10/15/2017  . Inflammatory arthritis 05/29/2016  . Pain in joint involving multiple sites 05/29/2016  . Screening-pulmonary TB 05/29/2016  . High risk medication use 04/25/2016  . Bradycardia   . Symptomatic bradycardia   . Junctional escape rhythm   . Orthostatic hypotension   . Thyroid activity decreased   . Hypomagnesemia   . Syncope 09/01/2015  . Nausea & vomiting 09/01/2015  . Hyponatremia 09/01/2015  . Cough 09/01/2015  . Hypertension   . Hyperlipidemia   . GERD (gastroesophageal reflux disease)   . Hypothyroidism   . Gastroesophageal reflux disease without esophagitis   . CLL (chronic lymphocytic leukemia) ([facility]) 07/14/2015  . Thyroid nodule, right 01/03/2015  . Thyroid nodule 01/03/2015  . Pelvic relaxation 04/08/2012  . Kidney stones 04/08/2012  . Recurrent UTI (urinary tract infection) 04/08/2012    Past  Surgical History:  Procedure Laterality Date  . CATARACT EXTRACTION, BILATERAL  2010  . CHOLECYSTECTOMY N/A 10/17/2017   Procedure: LAPAROSCOPIC CHOLECYSTECTOMY WITH INTRAOPERATIVE CHOLANGIOGRAM;  Surgeon: Clovis Riley, MD;  Location: [facility];  Service: General;  Laterality: N/A;  . COLONOSCOPY    . CYSTOSCOPY W/ STONE MANIPULATION Right   . CYSTOSCOPY WITH STENT PLACEMENT    . ERCP N/A 10/18/2017   Procedure: ENDOSCOPIC RETROGRADE CHOLANGIOPANCREATOGRAPHY (ERCP);  Surgeon: Ronnette Juniper, MD;  Location: [facility];  Service: Gastroenterology;  Laterality: N/A;  .  ESOPHAGOGASTRODUODENOSCOPY    . ESOPHAGOGASTRODUODENOSCOPY (EGD) WITH ESOPHAGEAL DILATION    . KNEE ARTHROSCOPY Left   . LITHOTRIPSY     "didn't work"  . SKIN CANCER EXCISION Right    anterior neck  . THYROID LOBECTOMY Right 01/03/2015  . THYROID LOBECTOMY Right 01/03/2015   Procedure: RIGHT THYROID LOBECTOMY;  Surgeon: Armandina Gemma, MD;  Location: [facility];  Service: General;  Laterality: Right;  . TONSILLECTOMY    . WISDOM TOOTH EXTRACTION      Allergies Prednisone, Zoledronic acid, Hydrochlorothiazide, and Lisinopril  Family History  Problem Relation Age of Onset  . Heart attack Mother   . Hyperlipidemia Mother   . Asthma Father   . Diabetes Father     Social History Social History   Tobacco Use  . Smoking status: Never Smoker  . Smokeless tobacco: Never Used  Vaping Use  . Vaping Use: Never used  Substance Use Topics  . Alcohol use: No  . Drug use: Never    Review of Systems  Constitutional: No fever/chills Eyes: No visual changes. ENT: No sore throat. Cardiovascular: Positive chest pain. Respiratory: Positive shortness of breath. Gastrointestinal: No abdominal pain.  No nausea, no vomiting.  No diarrhea.  No constipation. Genitourinary: Negative for dysuria. Musculoskeletal: Negative for back pain. Skin: Negative for rash. Neurological: Negative for headaches, focal weakness or numbness. Positive dizziness.   10-point ROS otherwise negative.  ____________________________________________   PHYSICAL EXAM:  VITAL SIGNS: ED Triage Vitals  Enc Vitals Group     BP 09/23/20 0832 (!) 184/144     Pulse Rate 09/23/20 0832 (!) 108     Resp 09/23/20 0832 (!) 25     Temp 09/23/20 0832 98.1 F (36.7 C)     Temp Source 09/23/20 0832 Oral     SpO2 09/23/20 0832 99 %   Constitutional: Alert with some agitation but able to engage in brief history and exam.  Eyes: Conjunctivae are normal.  Head: Atraumatic. Nose: No congestion/rhinnorhea. Mouth/Throat: Mucous  membranes are moist.  Neck: No stridor.  Cardiovascular: Tachycardia. Good peripheral circulation. Grossly normal heart sounds.   Respiratory: Normal respiratory effort.  No retractions. Lungs CTAB. Gastrointestinal: Soft and nontender. No distention.  Musculoskeletal: Pain with moving all extremities which limits range of motion but no deformity.  No focal bony tenderness.  Neurologic:  Normal speech and language.  No unilateral weakness or numbness appreciated of the patient's moderate agitation and underlying dementia does limit the exam to some degree. Skin:  Skin is warm, dry and intact. No rash noted.  ____________________________________________   LABS (all labs ordered are listed, but only abnormal results are displayed)  Labs Reviewed  COMPREHENSIVE METABOLIC PANEL - Abnormal; Notable for the following components:      Result Value   CO2 20 (*)    BUN 32 (*)    Creatinine, Ser 1.08 (*)    Albumin 2.9 (*)    GFR, Estimated 48 (*)  All other components within normal limits  ACETAMINOPHEN LEVEL - Abnormal; Notable for the following components:   Acetaminophen (Tylenol), Serum <10 (*)    All other components within normal limits  SALICYLATE LEVEL - Abnormal; Notable for the following components:   Salicylate Lvl <4.1 (*)    All other components within normal limits  CBC WITH DIFFERENTIAL/PLATELET - Abnormal; Notable for the following components:   RBC 3.86 (*)    All other components within normal limits  URINALYSIS, ROUTINE W REFLEX MICROSCOPIC - Abnormal; Notable for the following components:   Color, Urine STRAW (*)    All other components within normal limits  RESP PANEL BY RT-PCR (FLU A&B, COVID) [id number]  URINE CULTURE  ETHANOL  TROPONIN I (HIGH SENSITIVITY)  TROPONIN I (HIGH SENSITIVITY)   ____________________________________________  EKG   EKG Interpretation  Date/Time:  Saturday September 23 2020 08:43:48 EDT Ventricular Rate:  90 PR Interval:  178 QRS  Duration: 62 QT Interval:  350 QTC Calculation: 428 R Axis:   80 Text Interpretation: Sinus rhythm with marked sinus arrhythmia Septal infarct , age undetermined Abnormal ECG Confirmed by Nanda Quinton [phone]) on 09/23/2020 9:00:59 AM       ____________________________________________  RADIOLOGY  DG Chest 2 View  Result Date: 09/23/2020 CLINICAL DATA:  Shortness of breath. EXAM: CHEST - 2 VIEW COMPARISON:  05/25/2020 and 04/22/2018 FINDINGS: Lungs are hypoinflated with mild elevation of the left hemidiaphragm. Subtle opacification over the left posterior lung base which may be due to atelectasis versus small amount of pleural fluid. Cardiomediastinal silhouette and remainder of the exam is unchanged. IMPRESSION: Subtle posterior left base opacification likely atelectasis versus small amount of pleural fluid. Electronically Signed   By: Marin Olp M.D.   On: 09/23/2020 09:58   CT Head Wo Contrast  Result Date: 09/23/2020 CLINICAL DATA:  Mental status change. EXAM: CT HEAD WITHOUT CONTRAST TECHNIQUE: Contiguous axial images were obtained from the base of the skull through the vertex without intravenous contrast. COMPARISON:  05/25/2020 FINDINGS: Brain: Ventricles, cisterns and other CSF spaces are within normal. There is minimal age related atrophic change. There is mild chronic ischemic microvascular disease. Possible small old posterior left frontal infarct unchanged. No evidence of mass, mass effect, shift of midline structures or acute hemorrhage. No evidence of acute infarction. Vascular: No hyperdense vessel or unexpected calcification. Skull: Normal. Negative for fracture or focal lesion. Sinuses/Orbits: Orbits are normal. Subtle opacification over the ethmoid air cells. Remaining paranasal sinuses are clear. Other: None. IMPRESSION: 1. No acute findings. 2. Mild chronic ischemic microvascular disease and age related atrophic change. Possible small old posterior left frontal infarct unchanged.  Electronically Signed   By: Marin Olp M.D.   On: 09/23/2020 10:23   MR BRAIN WO CONTRAST  Result Date: 09/23/2020 CLINICAL DATA:  Mental status change. Dizziness. Baseline dementia. EXAM: MRI HEAD WITHOUT CONTRAST TECHNIQUE: Multiplanar, multiecho pulse sequences of the brain and surrounding structures were obtained without intravenous contrast. COMPARISON:  CT head without contrast 09/23/2020. MR head without contrast 05/27/2020 FINDINGS: Brain: No acute infarct, hemorrhage, or mass lesion is present. Moderate atrophy and white matter changes bilaterally are stable. The ventricles are proportionate to the degree of atrophy. The internal auditory canals are within normal limits. The brainstem and cerebellum are within normal limits. No significant extraaxial fluid collection is present. Vascular: Flow is present in the major intracranial arteries. Skull and upper cervical spine: The craniocervical junction is normal. Upper cervical spine is within normal limits. Marrow signal is  unremarkable. Sinuses/Orbits: Fluid is present in the right mastoid air cells. No obstructing nasopharyngeal lesion is present. The paranasal sinuses and mastoid air cells are otherwise clear. Bilateral lens replacements are noted. Globes and orbits are otherwise unremarkable. IMPRESSION: 1. No acute intracranial abnormality or significant interval change. 2. Stable atrophy and white matter disease. This likely reflects the sequela of chronic microvascular ischemia. 3. Right mastoid effusion. No obstructing nasopharyngeal lesion is present. Electronically Signed   By: San Morelle M.D.   On: 09/23/2020 14:55    ____________________________________________   PROCEDURES  Procedure(s) performed:   Procedures  None  ____________________________________________   INITIAL IMPRESSION / ASSESSMENT AND PLAN / ED COURSE  Pertinent labs & imaging results that were available during my care of the patient were reviewed by  me and considered in my medical decision making (see chart for details).   Patient presents to the emergency department for evaluation of altered mental status this morning.  Complaining to daughter of dizziness.  She mentions chest pain and trouble breathing to me.  Patient is DNR and has paperwork with her at bedside.  EKG does not show any acute ischemic change or arrhythmia.  I do not appreciate a focal neurologic deficit but the exam is somewhat limited by the patient's moderate agitation and dementia.  This does seem suddenly much worse than her baseline dementia symptoms which are fairly mild according to family.  Plan for CT imaging of the head along with chest x-ray, labs, UA and reassess.   Lab work reviewed.  Patient with UA showing no evidence of infection.  No electrolyte abnormality or kidney injury.  No leukocytosis or anemia.  CT scans of the head along with chest x-ray reviewed with no acute findings.  Sent patient for MRI with initial complaint of dizziness.  MRI resulting with no acute finding. COVID and Flu are negative.   Discussed the case with the patient's daughter by phone.  We discussed options including discussion with social worker regarding SNF placement and/or geriatric psychiatry evaluation.  The daughter and family feel that they have support at home with home health and family support.  They feel that they can handle the patient's behavioral disturbances as they arise.  They will continue to discuss with the primary care doctor if SNF placement becomes necessary from home.  Discussed ED return precautions in detail. ____________________________________________  FINAL CLINICAL IMPRESSION(S) / ED DIAGNOSES  Final diagnoses:  Transient alteration of awareness    MEDICATIONS GIVEN DURING THIS VISIT:  Medications  haloperidol lactate (HALDOL) injection 2 mg (2 mg Intravenous Given 09/23/20 0926)    Note:  This document was prepared using Dragon voice recognition  software and may include unintentional dictation errors.  Nanda Quinton, MD, [facility] Emergency Medicine    Isabel Freese, [facility], MD 09/24/20 [phone]

## 2020-09-23 NOTE — ED Notes (Signed)
Pt is now alert and oriented. Able to state her name, birthday. Able to identify her daughter. Pt keeps asking what happened as she does not remember events. Pt is redirectable at this time. Will continue to monitor. MRI called and will take pt as soon as they can.

## 2020-09-23 NOTE — Discharge Instructions (Signed)
You were seen in the emergency department today with change in your behavior seeming like anxiety possibly.  This is likely related to your underlying dementia.  Your MRI, chest x-ray, Covid test, urine test were all normal.  Please follow closely with your primary care doctor and take your home medications as prescribed.  Return to the emergency department any new or suddenly worsening symptoms.

## 2020-09-23 NOTE — ED Triage Notes (Signed)
Hx of dementia. Woke up yelling, screaming and inconsolable today. Oriented to self. EMS gave Asprin 324mg  PO d/t of chest pain. Pt denies. Lives at home.

## 2020-09-25 LAB — URINE CULTURE: Culture: NO GROWTH

## 2020-10-04 DIAGNOSIS — F0390 Unspecified dementia without behavioral disturbance: Secondary | ICD-10-CM | POA: Diagnosis not present

## 2020-10-04 DIAGNOSIS — F419 Anxiety disorder, unspecified: Secondary | ICD-10-CM | POA: Diagnosis not present

## 2020-10-04 DIAGNOSIS — I129 Hypertensive chronic kidney disease with stage 1 through stage 4 chronic kidney disease, or unspecified chronic kidney disease: Secondary | ICD-10-CM | POA: Diagnosis not present

## 2020-10-04 DIAGNOSIS — N183 Chronic kidney disease, stage 3 unspecified: Secondary | ICD-10-CM | POA: Diagnosis not present

## 2020-10-10 ENCOUNTER — Telehealth: Payer: Self-pay

## 2020-10-10 NOTE — Telephone Encounter (Signed)
12:05PM: Palliative care SW outreached patient/family for monthly telephonic visit and to schedule in person visit.  Call unsuccessful. SW left HIPPA compliant VM.  Awaiting return call. Will outreach again at later date and continue to offer palliative care support.

## 2020-10-10 NOTE — Telephone Encounter (Signed)
12PM: Palliative care SW outreached patient/family for monthly telephonic visit and to schedule in person visit.  Call unsuccessful. SW left HIPPA compliant VM.  Awaiting return call. Will outreach again at later date and continue to offer palliative care support.

## 2020-10-26 ENCOUNTER — Telehealth: Payer: Self-pay

## 2020-10-26 NOTE — Telephone Encounter (Signed)
905 am.  Phone call made to son [name] to schedule a home visit.  Son is agreeable to 5/13-10 am.  He will not be present for the visit but the private caregiver will be there.  Advised if there are any concerns, that I would follow up with a phone call.

## 2020-11-03 ENCOUNTER — Other Ambulatory Visit: Payer: Medicare Other

## 2020-11-03 ENCOUNTER — Other Ambulatory Visit: Payer: Self-pay

## 2020-11-03 VITALS — BP 140/100 | HR 78 | Temp 97.2°F | Resp 18

## 2020-11-03 DIAGNOSIS — Z515 Encounter for palliative care: Secondary | ICD-10-CM

## 2020-11-03 NOTE — Progress Notes (Signed)
COMMUNITY PALLIATIVE CARE SW NOTE  PATIENT NAME: Donna Burch DOB: 12/23/1926 MRN: [id number]  PRIMARY CARE PROVIDER: Harlan Stains, MD  RESPONSIBLE PARTY:  Acct ID - Guarantor Home Phone Work Phone Relationship Acct Type  [id number] - [phone]  Self P/F     [address], [name], [place] [address]     PLAN OF CARE and INTERVENTIONS:             1. GOALS OF CARE/ ADVANCE CARE PLANNING:  Patient is a DNR. MOST form completed. Patients daughter is POA. Patient has living will. Patient's goal is to remain at home as independent as possible.  2.         SOCIAL/EMOTIONAL/SPIRITUAL ASSESSMENT/ INTERVENTIONS:  SW and RN Almyra Free met with patient in patients home for initial visit. Son, [name], present for visit. Patient lives in a one story home alone but now has caregivers Helene Kelp and Dominica ([employer]) ) M-F 0700-5pm, and children rotate sifts in the evening and weekends.   Caregiver updated SW and RN medical condition and changes. Patient had recent ED visit due to complaints of dizziness and chest pains (anxiety). Caregiver noticing more cognitive deficits with patient and patient sleeping more. There is no dx of dementia listed in patients hx. No falls reported. Patient eats well, nearly three meals a day and drinks glucerna daily. Caregiver encouraging fluid intake, as patient does not like to drink water. Patient complains of pain in knees.   Patient gets injections in knees every 3-4 months. No other pain reported. Sleeping pretty well at night and naps during the day. Caregiver shares that patient is napping more during the day. RN reviewed medications and took vitals. Family prepares pill box.    SW discussed goals, reviewed care plan, provided emotional support, used active and reflective listening. Palliative care will continue to monitor and assist with long term care planning as needed.   3.         PATIENT/CAREGIVER EDUCATION/ COPING:  Patient A&O, Pt is HOH but able to  answer simple questions appropriately. Patient able to engage in linear conversation. No signs of depression or sadness. Patient was prescribed Mirtazapine by PCP a while ago and Patients family is supportive and open to all care suggestions.  4.         PERSONAL EMERGENCY PLAN:  Patient will call 9-1-1 for emergencies.   5.         COMMUNITY RESOURCES COORDINATION/ HEALTH CARE NAVIGATION:  Patients daughter manages her care.  6.         FINANCIAL/LEGAL CONCERNS/INTERVENTIONS:  None.      SOCIAL HX:  Social History   Tobacco Use  . Smoking status: Never Smoker  . Smokeless tobacco: Never Used  Substance Use Topics  . Alcohol use: No    CODE STATUS: DNR ADVANCED DIRECTIVES: Y MOST FORM COMPLETE:  N HOSPICE EDUCATION PROVIDED: N  PPS: Patient is MIN- MOD A with all task.    Time Spent: 45 min     [place], [place]

## 2020-11-03 NOTE — Progress Notes (Signed)
PATIENT NAME: Donna Burch DOB: 1926/09/05 MRN: [id number]  PRIMARY CARE PROVIDER: Harlan Stains, MD  RESPONSIBLE PARTY:  Acct ID - Guarantor Home Phone Work Phone Relationship Acct Type  [id number] - [phone]  Self P/F     [address], [name], [place] [address]    PLAN OF CARE and INTERVENTIONS:               1.  GOALS OF CARE/ ADVANCE CARE PLANNING:  Remain home with private sitters.  DNR and MOST form in place.                2.  PATIENT/CAREGIVER EDUCATION:  S/S of UTI               4. PERSONAL EMERGENCY PLAN:  Activate 911 for emergencies.               5.  DISEASE STATUS:  Joint visit with [place], SW, private sitter Clarene Critchley and patient.  Patient continues to ambulate with a rolling walker.  No falls are reported at this time.  Caregiver notes it has become more difficult to get patient to exercise has HH PT recommended. Patient requires assistance getting up from her chair and bed.  Patient often complains of bilateral knee pain.  She will occasionally get acetaminophen for more severe pain.  Caregiver is using a heating pad that has been beneficial in treating mild pain to patient bilateral knees.  Caregiver notes worsening cognitive function.  Patient is unable to engage in conversation for any length of time. She is more forgetful.  She is relying on her sitter to answer questions.  Patient is resistant to some personal care, especially bathing.  Patient is sleeping for about 6 hours total during the day.  She sleeps for 10-12 hours at night.  Caregiver notes patient will get up 1-2x during the night to void but returns back to sleep.  Po intake remains good.  Caregiver believes patient may have lost 1-2 lbs.  It is difficult to get patient to drink liquids.  She is given boost daily but rarely drinks this completely. Caregiver has to give her cues to drink liquids throughout the day.    Patient's children continue to rotate nights with patient and daughter  stays with patient on the weekends.  Son has been sick this week so Clarene Critchley has been staying day/night with patient.  Caregiver denies any s/s of UTI.  Patient remains continent of bowel and bladder.  She is having a bowel movement about every 3 days.  No issues with constipation are reported.    HISTORY OF PRESENT ILLNESS:  87 year old female with dementia.  Patient continues to be followed by Palliative Care monthly and PRN.  CODE STATUS: DNR ADVANCED DIRECTIVES: No MOST FORM: Yes PPS: weak 50%   PHYSICAL EXAM:   VITALS: Today's Vitals   11/03/20 1022  BP: (!) 140/100  Pulse: 78  Resp: 18  Temp: (!) 97.2 F (36.2 C)  SpO2: 92%  PainSc: 3   PainLoc: Knee    LUNGS: CTA, no wheezes present. CARDIAC: HRR EXTREMITIES: Trace edema present to right ankle SKIN: warm and dry to touch.  No skin breakdown reported.  NEURO: alert to self, worsening forgetfulness.       Lorenza Burton, RN

## 2020-12-11 DIAGNOSIS — M67912 Unspecified disorder of synovium and tendon, left shoulder: Secondary | ICD-10-CM | POA: Diagnosis not present

## 2020-12-11 DIAGNOSIS — M1712 Unilateral primary osteoarthritis, left knee: Secondary | ICD-10-CM | POA: Diagnosis not present

## 2020-12-11 DIAGNOSIS — M1711 Unilateral primary osteoarthritis, right knee: Secondary | ICD-10-CM | POA: Diagnosis not present

## 2020-12-19 ENCOUNTER — Telehealth: Payer: Self-pay

## 2020-12-19 NOTE — Telephone Encounter (Signed)
12:25 PM: Palliative care SW outreached patient son, [name], for monthly telephonic visit and to schedule in person visit.  Call unsuccessful. SW left HIPPA compliant VM.  Awaiting return call. Will outreach again at later date and continue to offer palliative care support.

## 2021-01-10 ENCOUNTER — Other Ambulatory Visit: Payer: Self-pay

## 2021-01-10 ENCOUNTER — Other Ambulatory Visit: Payer: Medicare Other

## 2021-01-10 VITALS — BP 126/78 | HR 81 | Temp 97.0°F

## 2021-01-10 DIAGNOSIS — Z515 Encounter for palliative care: Secondary | ICD-10-CM

## 2021-01-10 NOTE — Progress Notes (Signed)
PATIENT NAME: Donna Burch DOB: May 08, 1927 MRN: [id number]  PRIMARY CARE PROVIDER: Harlan Stains, MD  RESPONSIBLE PARTY:  Acct ID - Guarantor Home Phone Work Phone Relationship Acct Type  [id number] - [phone]  Self P/F     [address], [name], [place] [address]    PLAN OF CARE and INTERVENTIONS:               1.  GOALS OF CARE/ ADVANCE CARE PLANNING:  Family desires patient to remain home with private caregivers.               2.  PATIENT/CAREGIVER EDUCATION:  Disease Progression.               4. PERSONAL EMERGENCY PLAN:  Activate 911 for emergencies.               5.  DISEASE STATUS:  Joint visit completed with [place], SW, caregiver Clarene Critchley and patient.  Patient is found sitting on the sofa with a blanket on her legs. Caregiver notes worsening confusion and repetitive speech.   Patient's caregiver reports patient is sleeping more.  At times, she will got to sleep at 3 or 4 pm and will remain in the bed until the next morning.  Patient is sleeping for 6-7 hours during the caregiver's 10 hour shift.  Caregiver is reporting coughing with yellow sputum production.  Caregiver states patient will go through a box of tissues from coughing up yellow sputum.  No fevers are reported.  Caregiver reports this has been going on for about 2 weeks. PCP office is contacted and spoke with Tara-receptionist.  Update provided on cough and lung sounds.  Response pending.  329 pm  Incoming call from [place] from [facility].  PCP has ordered doxycycline for 7 days.  Script will be sent to [facility] on [address].  Should patient develop shortness of breath or symptoms worsen, PCP should be notified. 333 pm.  Phone call made to [facility] and updated on my visit today and also new orders for doxycycline.  Janace Litten states he will be going out of town this weekend but will notify the private caregiver and also his sister who will be overseeing patient's care while he is away.  Po intake  has decreased and variety of foods has decreased.  Caregiver states patient is preferring to eat more sweet foods where she would eat veggies and meats before. Caregiver feels like clothing is looser fitting.  Patient received cortisone injections to her knees but the caregiver as seen any improvement.  Patient is using a rolling walker.  No falls are reported by caregiver.  Patient remains continent of bowel and bladder.  No briefs are used.  Caregiver states patient is having bowel movements but not frequently on her shifts.  She is uncertain if patient is having bowel movements when her children are staying with her.   HISTORY OF PRESENT ILLNESS:  87 year old female with Dementia.  Patient is being followed by Palliative Care monthly and PRN.  CODE STATUS: DNR ADVANCED DIRECTIVES: No MOST FORM: Yes PPS: 40%   PHYSICAL EXAM:   VITALS: Today's Vitals   01/10/21 1243  BP: 126/78  Pulse: 81  Temp: (!) 97 F (36.1 C)  SpO2: 95%    LUNGS:  expiratory rales to left upper and middle lobe CARDIAC: Cor RRR}  EXTREMITIES: trace edema present to bilateral ankles. SKIN: Skin color, texture, turgor normal. No rashes or lesions or normal  NEURO: positive for gait problems and memory problems       Lorenza Burton, RN

## 2021-01-10 NOTE — Progress Notes (Signed)
COMMUNITY PALLIATIVE CARE SW NOTE  PATIENT NAME: Donna Burch DOB: 10/27/1926 MRN: [id number]  PRIMARY CARE PROVIDER: Harlan Stains, MD  RESPONSIBLE PARTY:  Acct ID - Guarantor Home Phone Work Phone Relationship Acct Type  [id number] - [phone]  Self P/F     [place], [name], [place] [address]     PLAN OF CARE and INTERVENTIONS:             GOALS OF CARE/ ADVANCE CARE PLANNING:  Patient is a DNR. MOST form completed. Patients daughter is POA. Patient has living will. Patient's goal is to remain at home as independent as possible.   2.         SOCIAL/EMOTIONAL/SPIRITUAL ASSESSMENT/ INTERVENTIONS:  SW and RN Almyra Free met with patient in patients home for follow up visit. Patient lives in a one story home alone and has caregivers Helene Kelp and Dominica ([employer]) ) M-F 0700-5pm, and children rotate sifts in the evening and weekends.  Caregiver updated SW and RN medical condition and changes. Caregiver noticing more cognitive deficits with patient and patient sleeping more. No falls reported. Caregiver encouraging fluid intake, as patient does not like to drink water. Patient complains of pain in knees. Recently had cortisone shots in knees, but still complain of pain. No other pain reported. Sleeping pretty well at night and naps during the day. Caregiver shares that patient is napping more during the day and wanting to go bed earlier, around 3-4PM.   RN reviewed medications and took vitals. Family prepares pill box.  Caregiver shares that patients son is concerned about patients coughing. Patient is producing some sputum with coughing. Caregiver shares that patient is more fatigued. Appetite has changed and decreased more.   SW discussed goals, reviewed care plan, provided emotional support, used active and reflective listening. Palliative care will continue to monitor and assist with long term care planning as needed.   3.       PATIENT/CAREGIVER EDUCATION/ COPING:  Patient A&O,  Pt is HOH but able to answer simple questions appropriately. Patient able to engage in linear conversation. No signs of depression or sadness. Patient was prescribed Mirtazapine by PCP a while ago and Patients family is supportive and open to all care suggestions.   4.         PERSONAL EMERGENCY PLAN:  Patient will call 9-1-1 for emergencies.   5.         COMMUNITY RESOURCES COORDINATION/ HEALTH CARE NAVIGATION:  Patients daughter manages her care.   6.         FINANCIAL/LEGAL CONCERNS/INTERVENTIONS:  None.      SOCIAL HX:  Social History   Tobacco Use   Smoking status: Never   Smokeless tobacco: Never  Substance Use Topics   Alcohol use: No    CODE STATUS: DNR  ADVANCED DIRECTIVES: Y MOST FORM COMPLETE: Y HOSPICE EDUCATION PROVIDED: N  PPS: Patient is MIN-MOD  A with ADL's. Caregivers assist with dressing and bathing and meal prep. Patient is ambulatory with RW and I with transfers.   Tim spent: 1 hr      [place], [place]

## 2021-02-16 ENCOUNTER — Telehealth: Payer: Self-pay

## 2021-02-16 NOTE — Telephone Encounter (Signed)
02/16/21 @ 12:30 PM: Palliative care SW outreached patients son, [name], to complete telephonic check in/visit and schedule next in home visit for patient.   Call unsuccessful. SW LVM. Awaiting return call.

## 2021-02-28 ENCOUNTER — Other Ambulatory Visit: Payer: Self-pay

## 2021-02-28 ENCOUNTER — Other Ambulatory Visit: Payer: Medicare Other

## 2021-02-28 VITALS — BP 118/72 | HR 75 | Temp 97.9°F

## 2021-02-28 DIAGNOSIS — R059 Cough, unspecified: Secondary | ICD-10-CM | POA: Diagnosis not present

## 2021-02-28 DIAGNOSIS — Z515 Encounter for palliative care: Secondary | ICD-10-CM

## 2021-02-28 NOTE — Progress Notes (Signed)
PATIENT NAME: Donna Burch DOB: 1927/06/04 MRN: [id number]  PRIMARY CARE PROVIDER: Harlan Stains, MD  RESPONSIBLE PARTY:  Acct ID - Guarantor Home Phone Work Phone Relationship Acct Type  [id number] - [phone]  Self P/F     [place], [name], [place] [address]    PLAN OF CARE and INTERVENTIONS:               1.  GOALS OF CARE/ ADVANCE CARE PLANNING:  Family would like to keep patient home with private caregivers during the day.               2.  PATIENT/CAREGIVER EDUCATION:  Disease Progression               4. PERSONAL EMERGENCY PLAN:  Activate 911 for emergencies.               5.  DISEASE STATUS:  Joint visit completed with [place], SW, caregiver Clarene Critchley and patient.  Patient is scheduled to be seen by PCP today.  Continues with coughing and phlegm production that is white in appearance. Patient reports some shortness of breath and difficulty taking deep breaths.  Sore throat reported today. Coughing spells are causing anxiety to patient and family is medicating her with lorazepam 0.5 mg as needed.  No coughing reported with food or liquids.   Family is trying to give medications in applesauce but are having some difficulty with this method.  Patient is requiring lots of encouragement to swallow her medications.  Caregiver notes sometimes patient does not want to swallow food and will spit this back out.   Caregiver notes that patient is asking for deceased family members.  No visual hallucinations are reported.  Patient is withdrawing more with caregiver.  Patient would previously argue with her caregiver but this has decreased significantly. Tearful behaviors are noted by caregiver.  Ongoing memory decline noted.   Po/fluid intake has decreased.  Caregiver feels patient may have lost a couple of pounds over the last month.  Family continues to encourage patient to drink liquids.  Caregiver states patient typically voids 2 x on a 10 hour shift.  Increase fatigue  and sleep noted especially during the day.  Caregiver is with patient 10 hours a day and notes patient will sleep for about 6 hours during her shift.  Uncertain how patient does during the night as her children rotate staying with her and caregiver does not have any reports. Caregiver is doing more for patient now.  Requires more assistance with adl's and can no longer stand on her own.  Once patient is in a standing position she is able to use a rolling walker.  Mobility has decreased due to the pain in her left knee.  Patient has discomfort to bilateral knees but left is worse per caregiver.  No falls are reported.   Patient remains continent of bowel and bladder.  She is able to verbal her need to toilet and has not required depends or briefs.   HISTORY OF PRESENT ILLNESS:  87 year old female with Dementia. Patient is being followed by Palliative Care monthly and PRN.  CODE STATUS: DNR ADVANCED DIRECTIVES: No MOST FORM: Yes PPS: 40%-caregiver now has to assist patient to a standing position.   PHYSICAL EXAM:   VITALS: Today's Vitals   02/28/21 0904  BP: 118/72  Pulse: 75  Temp: 97.9 F (36.6 C)  SpO2: 94%    LUNGS: decreased breath sounds CARDIAC: Cor RRR}  EXTREMITIES:  trace ankle edema SKIN: Skin color, texture, turgor normal. No rashes or lesions or normal  NEURO: positive for memory problems and weakness       Lorenza Burton, RN

## 2021-02-28 NOTE — Progress Notes (Signed)
COMMUNITY PALLIATIVE CARE SW NOTE  PATIENT NAME: Donna Burch DOB: 05/17/1927 MRN: [id number]  PRIMARY CARE PROVIDER: White, Cynthia, MD  RESPONSIBLE PARTY:  Acct ID - Guarantor Home Phone Work Phone Relationship Acct Type  [id number] - Core[place],FRANC* [phone]  Self P/F     [address], [place], [place] [address]     PLAN OF CARE and INTERVENTIONS:             GOALS OF CARE/ ADVANCE CARE PLANNING:  Patient is a DNR. MOST form completed. Patients daughter is POA. Patient has living will. Patient's goal is to remain at home as independent as possible.   2.         SOCIAL/EMOTIONAL/SPIRITUAL ASSESSMENT/ INTERVENTIONS:  SW and RN Julie met with patient in patients home for follow up visit. Patient lives in a one story home alone and has caregivers (Teresa and Vanessa ([employer]) ) M-F 0700-5pm, and children rotate sifts in the evening and weekends.   Caregiver updated SW and RN medical condition and changes. Patient shares she is not feeling good today. Patient has an appointment with PCP this morning, due to patient having a persistent cough with phylum production, which has resulted in patient having a sore throat. Caregiver noticing more cognitive deficits with patient and patient sleeping more. No falls reported. Patient complains of pain in knees, hands and L shoulder mainly due to arthritis. Caregiver shares that patient is not sleeping too well at night but tends to nap a lot during the day.  Caregiver shares that patient has had a decline since previous visit. Patient requiring more assistance with STS. Patient requiring more encouragement for eating and drinking fluids. Patient is sleeping more during the day. Patient has not had a bowel movement in nearly a week per caregiver.   RN reviewed medications and took vitals. Family prepares pill box.  Caregiver shares that patient has not taken her AM meds today due to patient complaining of sore throat from cough. Patient tends to not take  pills given in apple sauce either. Caregiver shares that patients son is concerned about patients coughing. Patient is producing some sputum with coughing. Caregiver shares that patient tends to get upset/irritated during the coughing spells that makes it worse, patient is given an anxiety pill, lorazepam, to assist with easing this. Appetite has changed and decreased more. Patient does not like supplement drinks. Caregiver encourages fluid, but patient does not consume them.  Psychosocial assessment done. No needs identified.    SW discussed goals, reviewed care plan, provided emotional support, used active and reflective listening. Palliative care will continue to monitor and assist with long term care planning as needed.   3.       PATIENT/CAREGIVER EDUCATION/ COPING:  Patient A&O, Pt is HOH but able to answer simple questions appropriately. Patient dozed off during the visit, and did not engage in much conversation during visit. No signs of depression or sadness. Patient does have symptoms of anxiety. Patient was prescribed Mirtazapine by PCP a while ago and Patients family is supportive and open to all care suggestions.Family could use more education dn support resources around dementia.   4.         PERSONAL EMERGENCY PLAN:  Patient will call 9-1-1 for emergencies.   5.         COMMUNITY RESOURCES COORDINATION/ HEALTH CARE NAVIGATION:  Patients daughter manages her care.   6.         FINANCIAL/LEGAL CONCERNS/INTERVENTIONS:  None.       SOCIAL HX:  Social History   Tobacco Use   Smoking status: Never   Smokeless tobacco: Never  Substance Use Topics   Alcohol use: No    CODE STATUS: DNR ADVANCED DIRECTIVES: Y MOST FORM COMPLETE:  Y HOSPICE EDUCATION PROVIDED: N  PPS: Patient is MIN A with all ADL's at this time and is ambulatory with RW short distances. Patient is A&O to self with poor insight and judgement.    Times spent: 45 min    , LCSW  

## 2021-03-09 ENCOUNTER — Emergency Department (HOSPITAL_COMMUNITY)
Admission: EM | Admit: 2021-03-09 | Discharge: 2021-03-09 | Disposition: A | Discharge status: Home / Self Care | Payer: Medicare Other | Attending: Physician Assistant | Admitting: Physician Assistant

## 2021-03-09 ENCOUNTER — Encounter (HOSPITAL_COMMUNITY): Payer: Self-pay

## 2021-03-09 ENCOUNTER — Other Ambulatory Visit: Payer: Self-pay

## 2021-03-09 DIAGNOSIS — F419 Anxiety disorder, unspecified: Secondary | ICD-10-CM | POA: Insufficient documentation

## 2021-03-09 DIAGNOSIS — R110 Nausea: Secondary | ICD-10-CM | POA: Diagnosis not present

## 2021-03-09 DIAGNOSIS — Z5321 Procedure and treatment not carried out due to patient leaving prior to being seen by health care provider: Secondary | ICD-10-CM | POA: Diagnosis not present

## 2021-03-09 DIAGNOSIS — F0390 Unspecified dementia without behavioral disturbance: Secondary | ICD-10-CM | POA: Insufficient documentation

## 2021-03-09 DIAGNOSIS — R000 Tachycardia, unspecified: Secondary | ICD-10-CM | POA: Diagnosis not present

## 2021-03-09 LAB — CBC WITH DIFFERENTIAL/PLATELET
Abs Immature Granulocytes: 0.02 10*3/uL (ref 0.00–0.07)
Basophils Absolute: 0.1 10*3/uL (ref 0.0–0.1)
Basophils Relative: 1 %
Eosinophils Absolute: 0.1 10*3/uL (ref 0.0–0.5)
Eosinophils Relative: 2 %
HCT: 35.8 % — ABNORMAL LOW (ref 36.0–46.0)
Hemoglobin: 11.7 g/dL — ABNORMAL LOW (ref 12.0–15.0)
Immature Granulocytes: 0 %
Lymphocytes Relative: 41 %
Lymphs Abs: 3 10*3/uL (ref 0.7–4.0)
MCH: 31.8 pg (ref 26.0–34.0)
MCHC: 32.7 g/dL (ref 30.0–36.0)
MCV: 97.3 fL (ref 80.0–100.0)
Monocytes Absolute: 0.6 10*3/uL (ref 0.1–1.0)
Monocytes Relative: 8 %
Neutro Abs: 3.5 10*3/uL (ref 1.7–7.7)
Neutrophils Relative %: 48 %
Platelets: 206 10*3/uL (ref 150–400)
RBC: 3.68 MIL/uL — ABNORMAL LOW (ref 3.87–5.11)
RDW: 13.5 % (ref 11.5–15.5)
WBC: 7.2 10*3/uL (ref 4.0–10.5)
nRBC: 0 % (ref 0.0–0.2)

## 2021-03-09 LAB — COMPREHENSIVE METABOLIC PANEL
ALT: 18 U/L (ref 0–44)
AST: 29 U/L (ref 15–41)
Albumin: 2.8 g/dL — ABNORMAL LOW (ref 3.5–5.0)
Alkaline Phosphatase: 73 U/L (ref 38–126)
Anion gap: 7 (ref 5–15)
BUN: 20 mg/dL (ref 8–23)
CO2: 26 mmol/L (ref 22–32)
Calcium: 9.2 mg/dL (ref 8.9–10.3)
Chloride: 106 mmol/L (ref 98–111)
Creatinine, Ser: 1.19 mg/dL — ABNORMAL HIGH (ref 0.44–1.00)
GFR, Estimated: 43 mL/min — ABNORMAL LOW (ref >60)
Glucose, Bld: 128 mg/dL — ABNORMAL HIGH (ref 70–99)
Potassium: 4 mmol/L (ref 3.5–5.1)
Sodium: 139 mmol/L (ref 135–145)
Total Bilirubin: 0.9 mg/dL (ref 0.3–1.2)
Total Protein: 7.5 g/dL (ref 6.5–8.1)

## 2021-03-09 LAB — LIPASE, BLOOD: Lipase: 29 U/L (ref 11–51)

## 2021-03-09 NOTE — ED Notes (Signed)
Pt expressed they no longer want to wait and are leaving with family at this time.

## 2021-03-09 NOTE — ED Triage Notes (Signed)
Pt accompanied by son who reports pt has caregivers who called him this morning reporting that she was feeling sick/nauseated this morning. Pt has hx of dementia. Pt denies abd pain, diarrhea or any specific complaints. Pt states "I am sick" and talks about her caregivers that she does not like. Pt given an anxiety pill she takes as needed. Pt alert.

## 2021-03-09 NOTE — ED Provider Notes (Signed)
Emergency Medicine Provider Triage Evaluation Note  Donna Burch , a 87 y.o. female  was evaluated in triage.  Pt complains of feeling anxious.  History is provided by son at the bedside.  Woke up this morning and states that she "felt sick."  She told her caretakers this.  There are unsure if this is related to anxiety, because her daughter has not visited her her in a while since she is in [place].  States that when her daughter has not visited her she will tend to feel anxious.  Patient denying any pain.  She is complaining about her caretakers.  Review of Systems  Positive: Anxiety, nausea Negative: Headache, chest pain, blurry vision  Physical Exam  BP (!) 158/99 (BP Location: Left Arm)   Pulse (!) 104   Temp 98.5 F (36.9 C) (Oral)   Resp (!) 22   SpO2 98%  Gen:   Awake, no distress   Resp:  Normal effort  MSK:   Moves extremities without difficulty  Other:  Symmetrical strength in bilateral upper and lower extremities.  No facial asymmetry.  Abdomen is soft  Medical Decision Making  Medically screening exam initiated at 9:32 AM.  Appropriate orders placed.  GWENDOLA POLLAND was informed that the remainder of the evaluation will be completed by another provider, this initial triage assessment does not replace that evaluation, and the importance of remaining in the ED until their evaluation is complete.  Will obtain lab work, EKG   Delia Heady, PA-C 03/09/21 [id number]    Valarie Merino, MD 03/10/21 1756

## 2021-03-12 DIAGNOSIS — Z0000 Encounter for general adult medical examination without abnormal findings: Secondary | ICD-10-CM | POA: Diagnosis not present

## 2021-03-12 DIAGNOSIS — I700 Atherosclerosis of aorta: Secondary | ICD-10-CM | POA: Diagnosis not present

## 2021-03-12 DIAGNOSIS — F419 Anxiety disorder, unspecified: Secondary | ICD-10-CM | POA: Diagnosis not present

## 2021-03-12 DIAGNOSIS — I129 Hypertensive chronic kidney disease with stage 1 through stage 4 chronic kidney disease, or unspecified chronic kidney disease: Secondary | ICD-10-CM | POA: Diagnosis not present

## 2021-03-12 DIAGNOSIS — C9110 Chronic lymphocytic leukemia of B-cell type not having achieved remission: Secondary | ICD-10-CM | POA: Diagnosis not present

## 2021-03-12 DIAGNOSIS — N183 Chronic kidney disease, stage 3 unspecified: Secondary | ICD-10-CM | POA: Diagnosis not present

## 2021-03-12 DIAGNOSIS — E890 Postprocedural hypothyroidism: Secondary | ICD-10-CM | POA: Diagnosis not present

## 2021-03-12 DIAGNOSIS — Z23 Encounter for immunization: Secondary | ICD-10-CM | POA: Diagnosis not present

## 2021-03-12 DIAGNOSIS — F0390 Unspecified dementia without behavioral disturbance: Secondary | ICD-10-CM | POA: Diagnosis not present

## 2021-03-12 DIAGNOSIS — E785 Hyperlipidemia, unspecified: Secondary | ICD-10-CM | POA: Diagnosis not present

## 2021-03-12 DIAGNOSIS — E441 Mild protein-calorie malnutrition: Secondary | ICD-10-CM | POA: Diagnosis not present

## 2021-03-12 DIAGNOSIS — E559 Vitamin D deficiency, unspecified: Secondary | ICD-10-CM | POA: Diagnosis not present

## 2021-04-02 DIAGNOSIS — Z23 Encounter for immunization: Secondary | ICD-10-CM | POA: Diagnosis not present

## 2021-04-03 DIAGNOSIS — I129 Hypertensive chronic kidney disease with stage 1 through stage 4 chronic kidney disease, or unspecified chronic kidney disease: Secondary | ICD-10-CM | POA: Diagnosis not present

## 2021-04-03 DIAGNOSIS — C9110 Chronic lymphocytic leukemia of B-cell type not having achieved remission: Secondary | ICD-10-CM | POA: Diagnosis not present

## 2021-04-03 DIAGNOSIS — N183 Chronic kidney disease, stage 3 unspecified: Secondary | ICD-10-CM | POA: Diagnosis not present

## 2021-04-03 DIAGNOSIS — E559 Vitamin D deficiency, unspecified: Secondary | ICD-10-CM | POA: Diagnosis not present

## 2021-04-03 DIAGNOSIS — E890 Postprocedural hypothyroidism: Secondary | ICD-10-CM | POA: Diagnosis not present

## 2021-04-03 DIAGNOSIS — E785 Hyperlipidemia, unspecified: Secondary | ICD-10-CM | POA: Diagnosis not present

## 2021-04-04 ENCOUNTER — Other Ambulatory Visit: Payer: Self-pay

## 2021-04-04 ENCOUNTER — Other Ambulatory Visit: Payer: Medicare Other

## 2021-04-04 DIAGNOSIS — Z515 Encounter for palliative care: Secondary | ICD-10-CM

## 2021-04-04 NOTE — Progress Notes (Signed)
COMMUNITY PALLIATIVE CARE SW NOTE  PATIENT NAME: Donna Burch DOB: [birth year] MRN: [id number]  PRIMARY CARE PROVIDER: Harlan Stains, MD  RESPONSIBLE PARTY:  Acct ID - Guarantor Home Phone Work Phone Relationship Acct Type  [id number] - [id number]  Self P/F     [place], [name], [place] [address]     PLAN OF CARE and INTERVENTIONS:             GOALS OF CARE/ ADVANCE CARE PLANNING:  Patient is a DNR. MOST form completed. Patients daughter is POA. Patient has living will. Patient's goal is to remain at home as independent as possible.   2.         SOCIAL/EMOTIONAL/SPIRITUAL ASSESSMENT/ INTERVENTIONS:  SW and RN Almyra Free met with patient in patients home for follow up visit. Patient lives in a one story home alone and has caregivers Helene Kelp and Dominica ([employer]) ) M-F 0700-5pm, and children rotate sifts in the evening and weekends.   Caregiver updated SW on medical condition and changes. Patient had ED visit on 9/16 due to anxiety and nausea. Caregiver shares that patient has been stable and back at baseline since ED visit. Caregiver shares that patient is no longer having causing spells and does not complain of sore throat. Per caregiver, cognitive decline continues to be present with patient and patient sleeping more. No falls reported. Patient complains of pain in knees, hands and L shoulder mainly due to arthritis. Patient recently booster shot to upper L shoulder and complains of this pain as well.   Patient is back to baseline with ADL's requiring some assistance with toileting hygiene and bathing. Patient becomes [facility] with exacerbation.     Caregiver shares that patient is taking her medications in applesauce with no issues. Appetite is back  to normal. Patient does not like supplement drinks. Fluid intake continues to be poor.   Psychosocial assessment done. No needs identified. Patients mood was jubilant and broad  Patient presented with her witty banter and engaged  fully during visit. No S/s of depression or anxiety during visit. Caregiver denies any tearfulness in patient lately. Patients brother visited her from [facility] a few weeks, patients sister whom lives locally visited her as well.   SW discussed goals, reviewed care plan, provided emotional support, used active and reflective listening. Palliative care will continue to monitor and assist with long term care planning as needed.   3.       PATIENT/CAREGIVER EDUCATION/ COPING:  Patient A&O, Pt is HOH but able to answer simple questions appropriately. No signs of depression or sadness. Patient does have hx of anxiety. Patient was prescribed Mirtazapine by PCP a while ago and Patients family is supportive and open to all care suggestions. Family could use more education and support resources around dementia.   4.         PERSONAL EMERGENCY PLAN:  Patient will call 9-1-1 for emergencies.   5.         COMMUNITY RESOURCES COORDINATION/ HEALTH CARE NAVIGATION:  Patients daughter manages her care.   6.         FINANCIAL/LEGAL CONCERNS/INTERVENTIONS:  None.     SOCIAL HX:  Social History   Tobacco Use   Smoking status: Never   Smokeless tobacco: Never  Substance Use Topics   Alcohol use: No    CODE STATUS: DNR ADVANCED DIRECTIVES: Y MOST FORM COMPLETE:  Y HOSPICE EDUCATION PROVIDED: N  PPS: Patient is [place] with all ADL's at this time. Patient  is A&O to self and place only. Patient present with poor insight and judgement due to advanced dementia.   Time spent: 45 min     [place], [place]

## 2021-04-09 DIAGNOSIS — E039 Hypothyroidism, unspecified: Secondary | ICD-10-CM | POA: Diagnosis not present

## 2021-04-09 DIAGNOSIS — Z8639 Personal history of other endocrine, nutritional and metabolic disease: Secondary | ICD-10-CM | POA: Diagnosis not present

## 2021-04-30 ENCOUNTER — Other Ambulatory Visit: Payer: Medicare Other

## 2021-04-30 ENCOUNTER — Ambulatory Visit: Payer: Medicare Other | Admitting: Hematology

## 2021-05-02 ENCOUNTER — Other Ambulatory Visit: Payer: Medicare Other

## 2021-05-02 ENCOUNTER — Other Ambulatory Visit: Payer: Self-pay

## 2021-05-02 VITALS — BP 110/62 | HR 73 | Temp 97.3°F | Resp 18

## 2021-05-02 DIAGNOSIS — Z515 Encounter for palliative care: Secondary | ICD-10-CM

## 2021-05-02 NOTE — Progress Notes (Signed)
COMMUNITY PALLIATIVE CARE SW NOTE  PATIENT NAME: Donna Burch DOB: 1927-02-04 MRN: [id number]  PRIMARY CARE PROVIDER: Harlan Stains, MD  RESPONSIBLE PARTY:  Acct ID - Guarantor Home Phone Work Phone Relationship Acct Type  [id number] - [phone]  Self P/F     [address], [name], [place] [address]     PLAN OF CARE and INTERVENTIONS:             GOALS OF CARE/ ADVANCE CARE PLANNING:  Patient is a DNR. MOST form completed. Patients daughter is POA. Patient has living will. Patient's goal is to remain at home as independent as possible.   2.         SOCIAL/EMOTIONAL/SPIRITUAL ASSESSMENT/ INTERVENTIONS:  SW and RN Almyra Free met with patient in patients home for follow up visit. Patient lives in a one story home alone and has caregivers Helene Kelp and Dominica (T&Th) ) M-F 0700-5pm, and children rotate sifts in the evening and weekends.   Caregiver updated SW on medical condition and changes. Patient saw PCP on 10/18. Patient started omeprazole.  Caregiver shares that patient is better today, but has been weaker and more confused over the past 2 weeks. Per caregiver, cognitive decline continues to be present with patient and patient sleeping more during the day. No falls reported. Patient complains of pain in knees, hands and L shoulder mainly due to arthritis. Patient requiring more assistance with STS.    Patient is experiencing disease progression. Per caregiver due to clients weakness over the past 2 weeks, client is requiring more assistance with ADL's, toileting hygiene and bathing. Patient becomes [facility] with exacerbation.     Caregiver shares that patient is taking her medications in applesauce with no issues. Appetite is normal. Patient does not like supplement drinks. Fluid intake continues to be poor. Family is concerned about dehydration in patient. Patient is becoming more incontinent with bladder. Patient not having regular bowel movement.   RN reviewed and checked vitals  and medications. Patient advised to take Metamucil or a stool softener daily. RN advised that patient be taken to urgent care if concerned about dehydration to receive IV fluids.   Psychosocial assessment done. No needs identified. Patients less engaged this visit. No S/s of depression or anxiety during visit. Caregiver shares that patient has tearful episodes when her daughter does not visit.    SW discussed goals, reviewed care plan, provided emotional support, used active and reflective listening. Palliative care will continue to monitor and assist with long term care planning as needed.   3.       PATIENT/CAREGIVER EDUCATION/ COPING:  Patient A&O, Pt is HOH but able to answer simple questions appropriately. No signs of depression or sadness. Patient does have hx of anxiety. Patient was prescribed Mirtazapine by PCP a while ago and Patients family is supportive and open to all care suggestions. Family could use more education and support resources around dementia.   4.         PERSONAL EMERGENCY PLAN:  Patient will call 9-1-1 for emergencies.   5.         COMMUNITY RESOURCES COORDINATION/ HEALTH CARE NAVIGATION:  Patients daughter manages her care.   6.         FINANCIAL/LEGAL CONCERNS/INTERVENTIONS:  None.        SOCIAL HX:  Social History   Tobacco Use   Smoking status: Never   Smokeless tobacco: Never  Substance Use Topics   Alcohol use: No    CODE STATUS: DNR  ADVANCED DIRECTIVES: Y MOST FORM COMPLETE: Y HOSPICE EDUCATION PROVIDED: N  PPS: Patient is MOD-A with all ADL's at this time. Patient is A&O to self with poor average insight and judgement.   Time spent: 45 min     [place], [place]

## 2021-05-02 NOTE — Progress Notes (Signed)
PATIENT NAME: Donna Burch DOB: 1926-10-13 MRN: [id number]  PRIMARY CARE PROVIDER: Harlan Stains, MD  RESPONSIBLE PARTY:  Acct ID - Guarantor Home Phone Work Phone Relationship Acct Type  [id number] - [phone]  Self P/F     [address], [name], [place] [address]    PLAN OF CARE and INTERVENTIONS:               1.  GOALS OF CARE/ ADVANCE CARE PLANNING:  Remain home with private caregivers and family.               2.  PATIENT/CAREGIVER EDUCATION:  Disease Progression               4. PERSONAL EMERGENCY PLAN:  Activate 911 for emergencies.                5.  DISEASE STATUS:  Joint visit completed with patient, sitter Clarene Critchley and [place], SW.  Weakness:  Patient with noticeable decline in strength.  Leaning towards the right side during this visit.  Caregiver reports patient will also lean to the left side.  When she is sleeping she will lean forward, almost to the point her head is resting on her knees.   No falls are reported.  Patient continues to utilize a rolling walker for safe ambulation.  No longer ambulating to the kitchen for meals.  Family is now bringing meals to her on a tray.  Patient is only ambulating to bed or the bathroom.  Caregiver is assisting patient to standing position.  She is assisting more with dressing, grooming and hygiene.  Appetite:  Patient's appetite is good per caregiver.  Main concern is hydration as patient does not drink liquids well.  Patient drank 2 sips of lemonade and made faces.  Attempted to give her [employer] but patient only drinks one sip before refusing any more. Caregiver notes decrease in urination.  Urine is cloudy in appearance.   Caregiver has asked if Palliative Care is able to provide IVF if patient is dehydrated.  Education provided on Palliative Care Services.  Encouraged Urgent Care visit or contacting PCP if fluid intake continues to be poor.  Education provided on disease progression.  Constipation:   Patient is taking Metamucil as needed to address constipation.  Caregiver reports hard, stools when on her visits.  Uncertain how often patient is having bowel movements as she has multiple caregivers.  Encouraged using metamucil on a daily basis.  If this is not effective, patient may benefit from a stool softner.  Dysphagia:  Caregiver reports patient reports having difficulty swallowing.  She is now administering medications in applesauce.  Patient will frequently complain of difficulty swallowing and coughs up phlegm.   Multiple tissues are used for phlegm.  Patient saw her PCP for this issue and was started on Omeprozole.  Caregiver does not feel there has been any change since the start of this medication.   Visit scheduled for 06/03/21 @ 11 am.    HISTORY OF PRESENT ILLNESS:  87 year old female with progressive Dementia.  Patient is being followed by Palliative Care every 4-8 weeks and PRN. MOST FORM: Yes PPS: 40%   PHYSICAL EXAM:   VITALS: Today's Vitals   05/02/21 1016  BP: 110/62  Pulse: 73  Resp: 18  Temp: (!) 97.3 F (36.3 C)  SpO2: 99%  PainSc: 0-No pain    LUNGS: clear to auscultation  CARDIAC: Cor RRR}  EXTREMITIES: - for edema SKIN:  Skin color, texture, turgor normal. No rashes or lesions or no lesions noted  NEURO: positive for gait problems, memory problems, and weakness       Lorenza Burton, RN

## 2021-05-21 DIAGNOSIS — E039 Hypothyroidism, unspecified: Secondary | ICD-10-CM | POA: Diagnosis not present

## 2021-06-04 ENCOUNTER — Other Ambulatory Visit: Payer: Self-pay

## 2021-06-04 ENCOUNTER — Other Ambulatory Visit: Payer: Medicare Other

## 2021-06-04 VITALS — BP 122/70 | HR 72 | Temp 97.5°F | Resp 18

## 2021-06-04 DIAGNOSIS — Z515 Encounter for palliative care: Secondary | ICD-10-CM

## 2021-06-04 NOTE — Progress Notes (Signed)
PATIENT NAME: Donna Burch DOB: [birth year] MRN: [id number]  PRIMARY CARE PROVIDER: Harlan Stains, MD  RESPONSIBLE PARTY:  Acct ID - Guarantor Home Phone Work Phone Relationship Acct Type  [id number] - [phone]  Self P/F     [address], [name], [place] [address]    PLAN OF CARE and INTERVENTIONS:               1.  GOALS OF CARE/ ADVANCE CARE PLANNING:  Remain home with private caregivers and family.               2.  PATIENT/CAREGIVER EDUCATION:  explained Palliative Care vs Hospice to private caregiver.               4. PERSONAL EMERGENCY PLAN:  Activate 911 for emergencies.               5.  DISEASE STATUS:  Joint visit completed with patient and private caregiver Lorriane Shire.   Hydration:  Private caregiver Lorriane Shire, reports fluid intake has improved some.  Probably not where it needs to be but much better.  Patient is drinking 1 pepsi a day and drinks flavored water throughout the day.  Patient drank mild with breakfast and has already completed a 8 oz bottle of flavored water before my visit.   Appetite- Patient is eating well.  No observed weight loss noted today.   Typically eating 2 meals a day with the private sitters.   Uncertain how patient eats for dinner with the children.  Clothes are not looser fitting.   Mobility:  Patient continues to use a rolling walker.  Able to get up from the chair without assistance.   No recent falls.  Caregiver reports family is having a harder time getting patient out of the house.   Patient continues to lean more to the left side.  Caregivers assist with dressing and bathing.  Patient is able to assist with hair and teeth brushing.   Constipation:  taking stool softner daily.   Small bowel movement this am but caregiver reports occasional issues with constipation.  Discussed use of higher fiber diet or addition of miralax if PCP agrees.   Dysphagia:  Patient continues to take medications in applesauce.  She is doing better with this  and no coughing is reported with meals.  Coughing up some phelgm but not as much as last visit.  Pain:  Caregiver reports some moaning last week but patient was not able to identify any pain.  ES Tylenol was administered with good effect.   Visit scheduled for January 9th @ 11 am.    HISTORY OF PRESENT ILLNESS:  87 year old female with Dementia.  Patient is being followed by Palliative Care monthly and as needed.  CODE STATUS: DNR ADVANCED DIRECTIVES: No MOST FORM: Yes PPS: 40%   PHYSICAL EXAM:   VITALS: Today's Vitals   06/04/21 1059  BP: 122/70  Pulse: 72  Resp: 18  Temp: (!) 97.5 F (36.4 C)  SpO2: 94%  PainSc: 0-No pain    LUNGS: clear to auscultation  CARDIAC: Cor RRR}  EXTREMITIES: - lower extremity edema SKIN: Skin color, texture, turgor normal. No rashes or lesions or normal  NEURO: positive for gait problems and memory problems       Lorenza Burton, RN

## 2021-06-12 DIAGNOSIS — M79674 Pain in right toe(s): Secondary | ICD-10-CM | POA: Diagnosis not present

## 2021-06-12 DIAGNOSIS — M2042 Other hammer toe(s) (acquired), left foot: Secondary | ICD-10-CM | POA: Diagnosis not present

## 2021-06-12 DIAGNOSIS — B351 Tinea unguium: Secondary | ICD-10-CM | POA: Diagnosis not present

## 2021-06-12 DIAGNOSIS — N183 Chronic kidney disease, stage 3 unspecified: Secondary | ICD-10-CM | POA: Diagnosis not present

## 2021-06-12 DIAGNOSIS — R609 Edema, unspecified: Secondary | ICD-10-CM | POA: Diagnosis not present

## 2021-06-12 DIAGNOSIS — M2041 Other hammer toe(s) (acquired), right foot: Secondary | ICD-10-CM | POA: Diagnosis not present

## 2021-06-27 ENCOUNTER — Telehealth: Payer: Self-pay

## 2021-06-27 NOTE — Telephone Encounter (Signed)
PC SW outreached patients dtr, Sharee Pimple, returning her call.  Call unsuccessful. SW LVM. Awaiting return call.

## 2021-06-27 NOTE — Telephone Encounter (Signed)
PC SW spoke with patients dtr, Donna Burch, and confirmed in person PC visit for Tue 07-03-21 @1215  with PC SW and RN.

## 2021-07-03 ENCOUNTER — Other Ambulatory Visit: Payer: Medicare Other

## 2021-07-03 ENCOUNTER — Other Ambulatory Visit: Payer: Self-pay

## 2021-07-03 VITALS — BP 154/78 | HR 74 | Temp 97.7°F | Resp 18

## 2021-07-03 DIAGNOSIS — Z515 Encounter for palliative care: Secondary | ICD-10-CM

## 2021-07-03 NOTE — Progress Notes (Signed)
PATIENT NAME: Donna Burch DOB: Jul 04, 1926 MRN: [id number]  PRIMARY CARE PROVIDER: Harlan Stains, MD  RESPONSIBLE PARTY:  Acct ID - Guarantor Home Phone Work Phone Relationship Acct Type  [id number] - [phone]  Self P/F     [address], [name], [place] [address]    PLAN OF CARE and INTERVENTIONS:               1.  GOALS OF CARE/ ADVANCE CARE PLANNING:  Remain home with private caregivers and family.               2.  PATIENT/CAREGIVER EDUCATION:  Disease progression.               4. PERSONAL EMERGENCY PLAN:  Activate 911 for emergencies.               5.  DISEASE STATUS:  Joint visit completed with [place], [place] with patient and private sitter Lorriane Shire for part of this visit and daughter Sharee Pimple separately.   Dementia:  Patient continues to show a decline in her overall condition.  She is not as engaged with story-telling as she was 6 months ago.  Repetitive in speech and often asks about the mail man.  Patient with upper body weakness and often leans on her elbow to hold herself upright on the sofa.  Decrease in mobility as patient will only ambulate with her walker to her bedroom or the bathroom.  Mostly taking her meals in the living room with a tv tray.  Starting to display more bowel and bladder incontinence.  Wearing depends daily.  Sleeping well throughout the night and napping 1-2 hours during the day with current sitter.  Daughter notes some hallucinations have been reported by the private caregivers.  Patient talking to her deceased spouse and thought her deceased dog was present.  Increase in sun-downing around 3 pm with increase confusion, patient wanting to go home, tearful behaviors. Appetite has remained good.  There does not appear to be any visible weight loss.  Meds are taken in applesauce.  Meats are limited due to difficulty swallowing.  Discussed softer foods/chopped food and risk for aspiration/choking.  Sitter notes phlegm production has decreased with  the use of an antihistamine.  Spoke with Jill-daughter at length regarding disease progression.  She discussed caregiver fatigue and is working to remedy the issue between her brothers.  They are considering having a private caregiver come in 1-2x nightly and to continue with daily caregivers during the week.  At this time, family is not looking a facility placement.  They would like to keep patient home for as long as possible.     HISTORY OF PRESENT ILLNESS:  88 year old female with Dementia.  Patient is being followed by Palliative Care monthly and as needed.  CODE STATUS: DNR ADVANCED DIRECTIVES: No MOST FORM: Yes PPS: 50% weak   PHYSICAL EXAM:   VITALS: Today's Vitals   07/03/21 1218  BP: (!) 154/78  Pulse: 74  Resp: 18  Temp: 97.7 F (36.5 C)  SpO2: 95%  PainSc: 0-No pain    LUNGS: clear to auscultation  CARDIAC: Cor irreg, irreg RRR}  EXTREMITIES: 2+ pitting edema present to bilateral lower calves. SKIN: Skin color, texture, turgor normal. No rashes or lesions or normal  NEURO: positive for gait problems and memory problems       Lorenza Burton, RN

## 2021-07-03 NOTE — Progress Notes (Signed)
COMMUNITY PALLIATIVE CARE SW NOTE ° °PATIENT NAME: Donna Burch °DOB: 03/10/1927 °MRN: [id number] ° °PRIMARY CARE PROVIDER: White, Cynthia, MD ° °RESPONSIBLE PARTY:  °Acct ID - Guarantor Home Phone Work Phone Relationship Acct Type  °[id number] - [place],FRANC* [phone]  Self P/F  °   [address], [place], [place] [address]  ° ° ° °PLAN OF CARE and INTERVENTIONS:             °GOALS OF CARE/ ADVANCE CARE PLANNING:  Goals include to maximize quality of life and symptom management. Our advance care planning conversation included a discussion about:    °The value and importance of advance care planning  °Review and updating or creation of an advance directive document.  ° Patient is a DNR. MOST form completed.   ° °2. Palliative care encounter: Palliative medicine team will continue to support patient, patient's family, and medical team. Visit consisted of counseling and education dealing with the complex and emotionally intense issues of symptom management and palliative care in the setting of serious and potentially life-threatening illness °SW and RN met with patient and patients caregiver Vanessa. Patients daughter Jill present for visit as well. Patient resides in a one story home.  ° °Functional changes/updates: Patient has had a functional and cognitive decline since last in person PC visit. No falls reported. Patient uses RW to ambulate but does not ambulate as much any longer. Patient is requiring more assistance with ADL's. Patient requires assistance with STS and hygiene patient is more incontinent now. ° °PC team discussed patient's disease prognosis in detail with patients daughter. Patient presents in the moderate to 5-6 stage of Alzheimer's currently as she is needing more assistance ADL's, changes in personality & behavior and experiencing more memory deficits. Families goal is for patient to remain in the home. Daughter share that they will be incorporating additional caregivers to provide overnight  care for patient to relieve caregiver fatigue/stress from family members.  ° °Home & Environment assessment: No concerns. ° °Protein calorie malnutrition/weight loss/Appetite: Patient and family share that his appetite has a good appetite. Fluid intake is poor-fair. Patient takes medications in applesauce.   ° °Family share patient is sleeping wells and naps during the day. ° °Psychosocial assessment: completed. No additional physical needs identified at this time. Ongoing support/resources will continued to be offered if needed. ° °Military hx: N/A  ° °Medical assessment: RN reviewed medications and took vitals. RN and SW provided education around potential life threatening illness and symptom management.  ° °Hospice discussion: N/A ° °SW discussed goals, reviewed care plan, provided emotional support, used active and reflective listening in the form of reciprocity emotional response. Questions and concerns were addressed. The patient/family was encouraged to call with any additional questions and/or concerns. PC Provided general support and encouragement, no other unmet needs identified. Will continue to follow. ° °3.         PATIENT/CAREGIVER EDUCATION/ COPING:   °Appearance: well groomed, appropriate given situation  °Mental Status: Alert/oriented to self. cognitive deficits present. °Eye Contact: fair. Patients eyes closed majority of visit, unless addressed directly.  °Thought Process: irrational  °Thought Content: not assessed  °Speech: Normal rate, volume, tone  °Mood: Normal and calm °Affect: Congruent to endorsed mood, full ranging °Insight: poor °Judgement: poor °Interaction Style: Cooperative ° °Patient A&O to self, able to simple conversation and answer simple Y/N questions. PHQ 9 not completed. Patients family is supportive.  ° °4.         PERSONAL EMERGENCY PLAN:    Patient/family will call 9-1-1 for emergencies.  ° °5.         COMMUNITY RESOURCES COORDINATION/ HEALTH CARE NAVIGATION:  daughter manages  his care. ° °6.  FINANCIAL CONCERNS/NEEDS: None. °  Primary Health Insurance: Medicare °Secondary Health Insurance: AARP °Prescription Coverage: Yes, no history of difficulty obtaining or affording prescriptions reported °   ° °SOCIAL HX:  °Social History  ° °Tobacco Use  ° Smoking status: Never  ° Smokeless tobacco: Never  °Substance Use Topics  ° Alcohol use: No  ° ° °CODE STATUS: DNR  °ADVANCED DIRECTIVES: Y °MOST FORM COMPLETE:  Y °HOSPICE EDUCATION PROVIDED: N ° °Time spent: 1hr ° ° ° ° ° ° , LCSW ° °

## 2021-07-20 ENCOUNTER — Other Ambulatory Visit: Payer: Self-pay

## 2021-07-20 ENCOUNTER — Other Ambulatory Visit: Payer: Medicare Other

## 2021-07-20 ENCOUNTER — Telehealth: Payer: Self-pay

## 2021-07-20 DIAGNOSIS — Z515 Encounter for palliative care: Secondary | ICD-10-CM

## 2021-07-20 NOTE — Telephone Encounter (Signed)
07/20/21 @ 2:30 PM: PC SW outreached patients daughter Sharee Pimple, to follow up on email inquiry she sent to [facility] about possible patient decline.  Call unsuccessful. SW LVM awaiting return call.

## 2021-07-23 ENCOUNTER — Other Ambulatory Visit: Payer: Medicare Other

## 2021-07-23 ENCOUNTER — Other Ambulatory Visit: Payer: Self-pay

## 2021-07-23 VITALS — BP 152/92 | HR 83 | Temp 97.3°F | Resp 22

## 2021-07-23 DIAGNOSIS — Z515 Encounter for palliative care: Secondary | ICD-10-CM

## 2021-07-23 NOTE — Progress Notes (Signed)
PATIENT NAME: Donna Burch DOB: Nov 06, 1926 MRN: [id number]  PRIMARY CARE PROVIDER: Harlan Stains, MD  RESPONSIBLE PARTY:  Acct ID - Guarantor Home Phone Work Phone Relationship Acct Type  [id number] - [phone]  Self P/F     [address], [name], [place] [address]    PLAN OF CARE and INTERVENTIONS:               1.  GOALS OF CARE/ ADVANCE CARE PLANNING:  Remain home with the assistance of her family and private caregivers.               2.  PATIENT/CAREGIVER EDUCATION:  Disease Progression               4. PERSONAL EMERGENCY PLAN:  Activate 911 for emergencies.                5.  DISEASE STATUS:  Joint visit completed with patient, caregiver-Vanessa and [place] Henrene Pastor, SW.  Nutrition:  Caregiver Lorriane Shire continues to endorse a good appetite.  They are focusing more on soft textured food with chopped meats.  Does not appear to have any weight loss.   Anxiety:  Patient had tearful/anxiety behavior about 1 week ago requiring ativan.  Lorriane Shire is usually able to re-direct patient on the rare occasions this happens with her. We discussed changes in routine can often cause anxiety.  Mobility:  Slow decline in overall mobility.  Patient has discomfort to her bilateral knees that have been chronic.  She is taking occasional acetaminophen to address her pain.  Daughter Sharee Pimple previously stayed with patient on the weekends and would take her out for breakfast, hair appointments and other outings.  Due to the decline in patient's mobility they are no longer able to go out.  Patient is requiring more assistance with sit to stand.  Continues to use her rolling walker to ambulate to the bathroom and her bedroom.   No falls reported.   Memory:  Patient is more forgetful and repetitive in speech.  No sense of time.  Still knows her children and able to call them by name.  Recognizes faces of visitors but unable to recall names.   Medication Management:  Taking medication whole in apple sauce  without issues.  Family continues to manage medications.   HISTORY OF PRESENT ILLNESS:  Donna Burch is a 88 year old female with a history of HTN, Atherosclerosis of the Aorta, GERD, Hypothyroidism, and Dementia.  Patient is being followed by Palliative Care monthly and PRN.   CODE STATUS: DNR ADVANCED DIRECTIVES: No MOST FORM: Yes PPS: 40%   PHYSICAL EXAM:   VITALS: Today's Vitals   07/23/21 1239  BP: (!) 152/92  Pulse: 83  Resp: (!) 22  Temp: (!) 97.3 F (36.3 C)  SpO2: 94%    LUNGS: clear to auscultation  CARDIAC: Cor RRR}  EXTREMITIES: - for edema SKIN: Skin color, texture, turgor normal. No rashes or lesions or mobility and turgor normal  NEURO: positive for coordination problems, gait problems, and memory problems       Lorenza Burton, RN

## 2021-07-23 NOTE — Progress Notes (Addendum)
PATIENT NAME: ELLAYNA HILLIGOSS DOB: July 12, 1926 MRN: [id number]  PRIMARY CARE PROVIDER: Harlan Stains, MD  RESPONSIBLE PARTY:  Acct ID - Guarantor Home Phone Work Phone Relationship Acct Type  [id number] [phone]  Self P/F     [address], [name], [place] [address]    Due to the COVID-19 crisis, this visit was done via telemedicine from my office and it was initiated and consent by this patient and or family.  I connected with  Joanette Gula OR PROXY on 07/23/21 by telephone and verified that I am speaking with the correct person using two identifiers.   I discussed the limitations of evaluation and management by telemedicine. The patient expressed understanding and agreed to proceed.   PLAN OF CARE and INTERVENTIONS:               1.  GOALS OF CARE/ ADVANCE CARE PLANNING:  Remain home with the assistance of family and private caregivers.                2.  PATIENT/CAREGIVER EDUCATION:  Disease Progression               4. PERSONAL EMERGENCY PLAN:  Activate 911 for emergencies.               5.  DISEASE STATUS:  Message received from [facility] regarding changes in patient over the weekend.  Patient started with a new caregiver last weekend and was agitated, excessive crying and confusion. Improved on Monday and no further issues since the weekend.   Patient continues with remeron and buspar routinely.  She was given a prn ativan which helped address agitation and crying over the weekend.  Discussed challenges of a new caregiver coming in with patient.  Change in routine will cause increase confusion and agitation with patient.  Discussed disease progression and use of ativan. Advised if symptoms persisted and use of ativan was required more regularly, discussion would need to had regarding medication adjustments and possible further testing to ensure something more was not present vs disease progression.   Advised daughter visit could be made on Monday to assess  patient.     HISTORY OF PRESENT ILLNESS:  88 year old female with Dementia.  Patient is being followed by Palliative Care monthly and as needed.  CODE STATUS: DNR ADVANCED DIRECTIVES: No MOST FORM: Yes PPS: 40%        Lorenza Burton, RN

## 2021-07-23 NOTE — Progress Notes (Signed)
COMMUNITY PALLIATIVE CARE SW NOTE  PATIENT NAME: Donna Burch DOB: 04/14/1927 MRN: [id number]  PRIMARY CARE PROVIDER: Harlan Stains, MD  RESPONSIBLE PARTY:  Acct ID - Guarantor Home Phone Work Phone Relationship Acct Type  [id number] - [phone]  Self P/F     [place], [place] [address]     PLAN OF CARE and INTERVENTIONS:             GOALS OF CARE/ ADVANCE CARE PLANNING:  Goals include to maximize quality of life and symptom management. Our advance care planning conversation included a discussion about:    The value and importance of advance care planning  Review and updating or creation of an advance directive document.              Patient is a DNR. MOST form completed.     Palliative care encounter: Palliative medicine team will continue to support patient, patient's family, and medical team. Visit consisted of counseling and education dealing with the complex and emotionally intense issues of symptom management and palliative care in the setting of serious and potentially life-threatening illness.  SW and RN met with patient and patients caregiver Donna Burch. Patient resides in a one story home.    Functional changes/updates: Patient has had a functional and cognitive decline since last in person PC visit. No falls reported. Patient uses RW to ambulate but does not ambulate as much any longer. Patient is requiring more assistance with ADL's. Patient requires assistance with STS and hygiene patient is more incontinent now.   Patients daughter voiced concerns of patient seeming to have gotten more confused and to the point it almost seems like she has forgotten how to walk along with more tearfulness and agitation.   Today patients reported symptoms have subsided. Caregiver share no concerns. Patient has not had episodes of tearfulness over the past few days.    Home & Environment assessment: No concerns.   Protein calorie malnutrition/weight  loss/Appetite: Patient and family share that appetite has been good. Fluid intake is poor-fair. Patient takes medications in applesauce.     Family share patient is sleeping wells and naps during the day.   Psychosocial assessment: completed. No additional physical needs identified at this time. Ongoing support/resources will continued to be offered if needed.   Military hx: N/A    Medical assessment: RN reviewed medications and took vitals. RN and SW provided education around potential life threatening illness and symptom management. Caregiver and family advised to continue to monitor symptoms, and if worsen to make PC and PCP office aware.    Hospice discussion: N/A   SW discussed goals, reviewed care plan, provided emotional support, used active and reflective listening in the form of reciprocity emotional response. Questions and concerns were addressed. The patient/family was encouraged to call with any additional questions and/or concerns. PC Provided general support and encouragement, no other unmet needs identified. Will continue to follow.   3.         PATIENT/CAREGIVER EDUCATION/ COPING:   Appearance: well groomed, appropriate given situation  Mental Status: Alert/oriented to self. cognitive deficits present. Eye Contact: fair. Patients eyes closed majority of visit, unless addressed directly.  Thought Process: irrational  Thought Content: not assessed  Speech: Normal rate, volume, tone  Mood: Normal and calm Affect: Congruent to endorsed mood, full ranging Insight: poor Judgement: poor Interaction Style: Cooperative   Patient A&O to self, able to simple conversation and answer simple Y/N questions. PHQ 9 not completed. Patients  family is supportive.    4.         PERSONAL EMERGENCY PLAN:  Patient/family will call 9-1-1 for emergencies.    5.         COMMUNITY RESOURCES COORDINATION/ HEALTH CARE NAVIGATION:  daughter manages his care.   6.        FINANCIAL CONCERNS/NEEDS:  None.                         Primary Health Insurance: Medicare Secondary Health Insurance: [employer] Prescription Coverage: Yes, no history of difficulty obtaining or affording prescriptions reported     SOCIAL HX:  Social History   Tobacco Use   Smoking status: Never   Smokeless tobacco: Never  Substance Use Topics   Alcohol use: No    CODE STATUS: DNR  ADVANCED DIRECTIVES: Y MOST FORM COMPLETE:  Y HOSPICE EDUCATION PROVIDED: N  PPS:50   TIME SPENT: 59 MIN    [place], [place]

## 2021-07-24 ENCOUNTER — Telehealth: Payer: Self-pay

## 2021-07-24 NOTE — Telephone Encounter (Signed)
1020 am.  Message received from daughter Sharee Pimple.  Concerned over increase tearful behaviors and anxiety.  Caregiver Lorriane Shire is typically able to manage this by redirecting patient.  Other private caregivers are not as successful.  Recent addition of weekend private duty caregiver did cause anxiety and tearfulness about 2 weeks ago and ativan was used.  Ativan is effective but causes to much drowsiness.  Daughter would like to explore remeron and buspar dosage to ensure patient has the best coverage to manage her anxiety and tearfulness.  Phone cal made to PCP office and update provided.  Response pending.

## 2021-07-24 NOTE — Telephone Encounter (Signed)
Incoming call from [place] with PCP office.  MD has increased Buspar to 10 mg po bid.  Patient may take (2) 5 mg tablets bid until current supply is exhausted.  New script will be faxed to pharmacy for 10 mg tablets.   345 pm.  Phone call made to daughter Alverda Skeans with update on medication increase.  Message left on VM.

## 2021-08-09 ENCOUNTER — Other Ambulatory Visit: Payer: Medicare Other

## 2021-08-09 ENCOUNTER — Other Ambulatory Visit: Payer: Self-pay

## 2021-08-09 DIAGNOSIS — Z515 Encounter for palliative care: Secondary | ICD-10-CM

## 2021-08-09 NOTE — Progress Notes (Signed)
PATIENT NAME: CHLORA MCBAIN DOB: August 04, 1926 MRN: [id number]  PRIMARY CARE PROVIDER: Harlan Stains, MD  RESPONSIBLE PARTY:  Acct ID - Guarantor Home Phone Work Phone Relationship Acct Type  [id number] [phone]  Self P/F     [place], [name], [place] [address]   Due to the COVID-19 crisis, this visit was done via telemedicine from my office and it was initiated and consent by this patient and or family.  I connected with  Joanette Gula OR PROXY on 08/09/21 by telephone and verified that I am speaking with the correct person using two identifiers.   I discussed the limitations of evaluation and management by telemedicine. The patient expressed understanding and agreed to proceed.   Return call made to [facility] regarding nighttime behaviors she is seeing in patient.  Sharee Pimple reports restlessness and anxiety are displayed in patient periodically at night when she is in the bed.  We discussed the use of lorazepam and purpose of administering this.  Discussed possible side effect of drowsiness/sedation that has previously been seen in patient when this was given.   Discussed concern for fall risk as patient will occasionally get up during the night use the bathroom.  Daughter will try this on a prn basis and if not effective will notify Palliative Care or PCP.  Lorenza Burton, RN

## 2021-08-22 ENCOUNTER — Other Ambulatory Visit: Payer: Medicare Other

## 2021-08-22 ENCOUNTER — Other Ambulatory Visit: Payer: Self-pay

## 2021-08-22 VITALS — BP 142/86 | HR 95 | Temp 97.5°F | Resp 24 | Ht 64.0 in | Wt 120.0 lb

## 2021-08-22 DIAGNOSIS — Z515 Encounter for palliative care: Secondary | ICD-10-CM

## 2021-08-22 NOTE — Progress Notes (Signed)
COMMUNITY PALLIATIVE CARE SW NOTE ? ?PATIENT NAME: Donna Burch ?DOB: Aug 11, 1926 ?MRN: [id number] ? ?PRIMARY CARE PROVIDER: Harlan Stains, MD ? ?RESPONSIBLE PARTY:  ?Acct ID - Guarantor Home Phone Work Phone Relationship Acct Type  ?[id number] [facility]* [phone]  Self P/F  ?   [place], [name], [place] [address]  ? ? ? ?PLAN OF CARE and INTERVENTIONS:             ?GOALS OF CARE/ ADVANCE CARE PLANNING:  Goals include to maximize quality of life and symptom management. Our advance care planning conversation included a discussion about:    ?The value and importance of advance care planning  ?Review and updating or creation of an advance directive document.  ?            Patient is a DNR. MOST form completed.   ? ?2.  Palliative care encounter: Palliative medicine team will continue to support patient, patient's family, and medical team. Visit consisted of counseling and education dealing with the complex and emotionally intense issues of symptom management and palliative care in the setting of serious and potentially life-threatening illness. ?  ?SW and RN met with patient and patients caregiver Helene Kelp. Patient resides in a one story home.  ?  ?Functional changes/updates: Patient has had a functional and cognitive decline since last in person PC visit. No falls reported. Patient uses RW to ambulate but does not ambulate as much any longer. Patient is having more difficulty with STS, has become more MIN-MOD A. Patient is requiring more assistance with ADL's. Patient requires assistance with STS and hygiene patient is more incontinent now. ?   ?Today patients reported symptoms have subsided. Caregiver share that patient can become "rowdy" at times mainly around hygiene/bathing times. Patient also becomes agitated when daughter leaves. Patient has not had episodes of tearfulness over the past few days.  ?  ?Home & Environment assessment: No concerns. ?  ?Protein calorie malnutrition/weight loss/Appetite:  Patient and family share that appetite has been good. Fluid intake is poor-fair. Patient takes medications in applesauce.   ?  ?Caregiver share patient is sleeping wells and naps majority of the day. ?  ?Psychosocial assessment: completed. No additional physical needs identified at this time. Ongoing support/resources will continued to be offered if needed. Patient has private caregivers 7 days a week.  ?  ?Military hx: N/A   ?  ?Medical assessment: RN reviewed medications and took vitals. RN and SW provided education around potential life threatening illness and symptom management. Caregiver and family advised to continue to monitor symptoms, and if worsen to make PC and PCP office aware. Patient started on Buspar for anxiety last month PRN. Current weight is 120lbs.  ?  ?Hospice discussion: N/A ?  ?SW discussed goals, reviewed care plan, provided emotional support, used active and reflective listening in the form of reciprocity emotional response. Questions and concerns were addressed. The patient/family was encouraged to call with any additional questions and/or concerns. PC Provided general support and encouragement, no other unmet needs identified. Will continue to follow. ?  ?3.         PATIENT/CAREGIVER EDUCATION/ COPING:   ?Appearance: well groomed, appropriate given situation  ?Mental Status: Alert/oriented to self. cognitive deficits present. ?Eye Contact: fair. Patients eyes closed majority of visit, unless addressed directly.  ?Thought Process: irrational  ?Thought Content: not assessed  ?Speech: Normal rate, volume, tone  ?Mood: Normal and calm ?Affect: Congruent to endorsed mood, full ranging ?Insight: poor ?Judgement: poor ?Interaction Style:  Cooperative ?  ?Patient A&O to self, able to simple conversation and answer simple Y/N questions. PHQ 9 not completed. Patients family is supportive.  ?  ?4.         PERSONAL EMERGENCY PLAN:  Patient/family will call 9-1-1 for emergencies.  ?  ?5.          COMMUNITY RESOURCES COORDINATION/ HEALTH CARE NAVIGATION:  daughter manages his care. ?  ?6.        FINANCIAL CONCERNS/NEEDS: None. ?                        Primary Health Insurance: Medicare ?Secondary Health Insurance: [place] ?Prescription Coverage: Yes, no history of difficulty obtaining or affording prescriptions reported  ? ?SOCIAL HX:  ?Social History  ? ?Tobacco Use  ? Smoking status: Never  ? Smokeless tobacco: Never  ?Substance Use Topics  ? Alcohol use: No  ? ? ?CODE STATUS: DNR  ?ADVANCED DIRECTIVES: Y ?MOST FORM COMPLETE:  Y ?HOSPICE EDUCATION PROVIDED: N ? ?PPS: Patient is MOD-MIN A with all ADL's at this time. Patient is A&O X1 with poor insight and judgement.  ? ? ?Time spent: 45 min  ? ? ? ? ? ?Doreene Eland, LCSW ? ?

## 2021-08-22 NOTE — Progress Notes (Signed)
PATIENT NAME: Donna Burch ?DOB: 1927/03/27 ?MRN: [id number] ? ?PRIMARY CARE PROVIDER: Harlan Stains, MD ? ?RESPONSIBLE PARTY:  ?Acct ID - Guarantor Home Phone Work Phone Relationship Acct Type  ?[id number] [facility]* [phone]  Self P/F  ?   [place], [name],  [address]  ? ? ?PLAN OF CARE and INTERVENTIONS: ?              1.  GOALS OF CARE/ ADVANCE CARE PLANNING:  Remain home with private caregivers and family.  Avoid hospitalizations if possible.  ?              2.  PATIENT/CAREGIVER EDUCATION:  Disease progression. ?              4. PERSONAL EMERGENCY PLAN:  Activate 911 for emergencies.  ?              5.  DISEASE STATUS: ? ?Anxiety:  Decrease in anxiety noted since buspar was increased last month.  Patient struggles with changes in her routine and changes in private caregivers.  These seem to be triggers for her anxiety.  Caregiver notes some sun-downing but patient is typically able to be re-directed.  ? ?Appetite:  No changes with appetite per caregiver.  Weight obtained and this remains stable at 120 lbs.  ? ?Debility:  Ongoing gradual decline noted in mobility.  Patient is unable to get up from her chair without assistance.  Once she is up, gait is slow and shuffling.  High risk for falls.  ? ?Dementia:  Ongoing decline with disease progression.  Patient is requiring more cues to complete tasks. Caregiver is assisting with dressing, grooming and incontinence care.  Now completely incontinent of urine and on occasion incontinent of bowel. ? ? ?HISTORY OF PRESENT ILLNESS:   [name] is a 88 year old female with a history of HTN, Atherosclerosis of the Aorta, GERD, Hypothyroidism, and Dementia.  Patient is being followed by Palliative Care monthly and PRN.  ?  ? ?CODE STATUS: DNR-form in the home.  ?ADVANCED DIRECTIVES: No ?MOST FORM: Yes ?PPS: 40% ? ? ?PHYSICAL EXAM:  ? ?VITALS: ?Today's Vitals  ? 08/22/21 1137  ?BP: (!) 142/86  ?Pulse: 95  ?Resp: (!) 24  ?Temp: (!) 97.5 ?F (36.4  ?C)  ?SpO2: 97%  ?Weight: 120 lb (54.4 kg)  ?Height: 5\' 4"  (1.626 m)  ?PainSc: 0-No pain  ?  ?LUNGS: clear to auscultation  ?CARDIAC: Cor RRR}  ?EXTREMITIES: - for edema ?SKIN: Skin color, texture, turgor normal. No rashes or lesions or mobility and turgor normal  ?NEURO: positive for gait problems, memory problems, and weakness ? ? ? ? ? ? ?Lorenza Burton, RN ? ?

## 2021-08-31 ENCOUNTER — Other Ambulatory Visit: Payer: Medicare Other

## 2021-08-31 ENCOUNTER — Other Ambulatory Visit: Payer: Self-pay

## 2021-08-31 VITALS — BP 122/80 | HR 96 | Temp 98.2°F | Resp 22

## 2021-08-31 DIAGNOSIS — Z515 Encounter for palliative care: Secondary | ICD-10-CM

## 2021-08-31 NOTE — Progress Notes (Signed)
PATIENT NAME: Donna Burch ?DOB: Sep 13, 1926 ?MRN: [id number] ? ?PRIMARY CARE PROVIDER: Harlan Stains, MD ? ?RESPONSIBLE PARTY:  ?Acct ID - Guarantor Home Phone Work Phone Relationship Acct Type  ?[id number] [facility]* [phone]  Self P/F  ?   [place], [name], [place] [address]  ? ? ?PLAN OF CARE and INTERVENTIONS: ?              1.  GOALS OF CARE/ ADVANCE CARE PLANNING:  DNR in place.  Avoid hospitalizations if possible.  ?              2.  PATIENT/CAREGIVER EDUCATION:  Safety, Disease progression ?              4. PERSONAL EMERGENCY PLAN:  Activate 911 for emergencies. ?              5.  DISEASE STATUS: ? ?Edema:  This has improved from yesterday.  Caregiver Clarene Critchley believes excessive edema was related to tight socks.  She has 1+ pedal edema today and is wearing loose socks.  We discussed elevated patient's legs but she has been non-compliant.  ? ?Fall Risk:  Patient has sustained 3 falls this week.  Caregiver/family have found patient in the floor beside her bed x 2 and she slid into the floor from the couch with her sitter.  No injuries reported.  ? ?Functional Decline:  Patient is unable to stand without max assistance.  Ambulation is very slow and patient needs significant direction to walk short distances.  We discussed the use of a wheelchair that patient has in the home.  Patient will likely need this in the near future given her increase in weakness.  She is unable to sit upright  on the sofa for an extended amount of time. Often leans to the left side and using a pillow to keep her propped upright. ? ?Incontinence:  Patient is completely incontinent of bowel and bladder. ? ?Medication Management:  Discussed medication reduction.  Patient will follow up with PCP on 3/21 and further discuss.  ? ?Nutrition/Swallowing:  Displaying difficulty with swallowing.  Unable to swallow pills whole unless they are very small in size.  Caregiver is crushing medication but still unable to get patient  swallow.  Eating an average of 2 meals a day.  Best meal is breakfast.  Often will miss dinner as she is typically asleep in the bed.  Caregiver feels clothes are looser now.  Patient was feed meals yesterday as she was unable to use her utensils.  Today she is using her fingers to eat a waffle and bacon that has been cut into small pieces.  ? ?Skin Breakdown:  Stage 2 present to left buttock.  Caregivers are applying A & D ointment ? ? ?HISTORY OF PRESENT ILLNESS:  [name] is a 88 year old female with a history of HTN, Atherosclerosis of the Aorta, GERD, Hypothyroidism, and Dementia.  Patient is being followed by Palliative Care monthly and PRN.  ? ?CODE STATUS: DNR ?ADVANCED DIRECTIVES: No ?MOST FORM: Yes ?PPS: 40%  (weak) ? ? ?PHYSICAL EXAM:  ? ?VITALS: ?Today's Vitals  ? 08/31/21 0850  ?BP: 122/80  ?Pulse: 96  ?Resp: (!) 22  ?Temp: 98.2 ?F (36.8 ?C)  ?SpO2: 93%  ?  ?LUNGS: clear to auscultation  ?CARDIAC: Cor RRR}  ?EXTREMITIES: 1+ pedal edema ?SKIN: Skin color, texture, turgor normal. No rashes or lesions or Stage 2 to left buttock.    ?NEURO: positive for coordination  problems, gait problems, memory problems, and weakness ? ? ? ? ? ? ?Lorenza Burton, RN ? ?

## 2021-09-10 ENCOUNTER — Other Ambulatory Visit: Payer: Medicare Other

## 2021-09-10 ENCOUNTER — Other Ambulatory Visit: Payer: Self-pay

## 2021-09-10 VITALS — BP 150/80 | HR 87 | Temp 98.2°F | Resp 22

## 2021-09-10 DIAGNOSIS — Z515 Encounter for palliative care: Secondary | ICD-10-CM

## 2021-09-10 NOTE — Progress Notes (Signed)
PATIENT NAME: Donna Burch ?DOB: 1927/03/22 ?MRN: [id number] ? ?PRIMARY CARE PROVIDER: Harlan Stains, MD ? ?RESPONSIBLE PARTY:  ?Acct ID - Guarantor Home Phone Work Phone Relationship Acct Type  ?[id number] [facility]* [phone]  Self P/F  ?   [place], [place], [name] [address]  ? ? ?PLAN OF CARE and INTERVENTIONS: ?              1.  GOALS OF CARE/ ADVANCE CARE PLANNING:  Avoid hospitalizations if possible.  ?              2.  PATIENT/CAREGIVER EDUCATION:  Disease Progression ?              4. PERSONAL EMERGENCY PLAN:  Activate 911 for emergencies.  ?              5.  DISEASE STATUS: ? ?Falls:  Patient sustained a fall on Sunday in her room. No reported injuries.  Caregiver Donna Burch, believes patient remained in the bed after her fall yesterday.  Patient is having multiple falls weekly.  Sliding into the floor from her bed and the sofa. ? ?Pain:  Patient with discomfort present when repositioning and ambulation to the living room.  Once she is on the sofa, no further issues with pain are noted.  I have instructed the caregiver to notify me if pain presents again or worsens.  Advised discomfort/pain maybe present after falls but there could be concern for a fracture.  Donna Burch verbalized understanding.  ? ?Safety:  Discussion completed on safety and use of a wheelchair if patient is unsafe to ambulate with her Burch. ? ?Update provided to Donna Burch.  Follow up visit will be completed tomorrow and virtual visit will occur with Donna Sorenson, NP.  ? ? ?HISTORY OF PRESENT ILLNESS:  Donna Burch is a 88 year old female with a history of HTN, Atherosclerosis of the Aorta, GERD, Hypothyroidism, and Dementia.  Patient is being followed by Palliative Care monthly and PRN ? ?CODE STATUS: DNR ?ADVANCED DIRECTIVES: No ?MOST FORM: Yes ?PPS: 40% ? ? ?PHYSICAL EXAM:  ? ?VITALS: ?Today's Vitals  ? 09/10/21 0833  ?BP: (!) 150/80  ?Pulse: 87  ?Resp: (!) 22  ?Temp: 98.2 ?F (36.8 ?C)  ?SpO2: 94%  ?PainSc: 6    ?PainLoc: Back  ?  ?LUNGS: clear to auscultation  ?CARDIAC: Cor RRR}  ?EXTREMITIES: - for edema ?SKIN: Skin color, texture, turgor normal. No rashes or lesions or mobility and turgor normal  ?NEURO: positive for gait problems, memory problems, and weakness ? ? ? ? ? ? ?Donna Burton, RN ? ?

## 2021-09-11 ENCOUNTER — Other Ambulatory Visit: Payer: Self-pay

## 2021-09-11 ENCOUNTER — Other Ambulatory Visit: Payer: Medicare Other | Admitting: Hospice

## 2021-09-11 VITALS — BP 122/80 | HR 93 | Temp 97.7°F | Resp 20

## 2021-09-11 DIAGNOSIS — Z515 Encounter for palliative care: Secondary | ICD-10-CM

## 2021-09-11 DIAGNOSIS — F0390 Unspecified dementia without behavioral disturbance: Secondary | ICD-10-CM

## 2021-09-11 DIAGNOSIS — M545 Low back pain, unspecified: Secondary | ICD-10-CM

## 2021-09-11 DIAGNOSIS — R269 Unspecified abnormalities of gait and mobility: Secondary | ICD-10-CM

## 2021-09-11 NOTE — Progress Notes (Signed)
? ? ?Manufacturing engineer ?Community Palliative Care Consult Note ?Telephone: [phone]  ?Fax: [phone] ? ?PATIENT NAME: Donna Burch ?[address] ?[place] [address] ?[phone] (home)  ?DOB: 1927-01-05 ?MRN: [id number] ? ?PRIMARY CARE PROVIDER:    ?Harlan Stains, MD,  ?[address] ?[place] [address] ?[phone] ? ?REFERRING PROVIDER:   ?Harlan Stains, MD ?[address] ?[address] ?[place],  [place] [address] ?[phone] ? ?RESPONSIBLE PARTY:    ?Contact Information   ? ? Name Relation Home Work Mobile  ? Shore,Jill Daughter [phone]  [phone]  ? Ling, Flesch Son [phone]  [phone]  ? Katlen, Seyer Son   [phone]  ? Arvil Chaco Granddaughter   [phone]  ? ?  ? ? ?TELEHEALTH VISIT STATEMENT ?Due to the COVID-19 crisis, this visit was done via telemedicine from my office and it was initiated and consent by this patient and or family.  ?I connected with patient OR PROXY by a telephone/video  and verified that I am speaking with the correct person. I discussed the limitations of evaluation and management by telemedicine. The patient expressed understanding and agreed to proceed. ?Palliative Care was asked to follow this patient to address advance care planning, complex medical decision making and goals of care clarification. Vance Gather RN is with patient during visit. This is the initial visit.  ? ?  ASSESSMENT AND / RECOMMENDATIONS:  ? ?CODE STATUS:  DNR-Gold form in the home.  ? ?Goals of Care: Goals include to maximize quality of life and symptom management ? ?Symptom Management/Plan: ? ?Dementia: Memory loss/confusion in line with dementia disease trajectory.  Incontinent of bowel and bladder, FAST 6D.  Encourage reminiscence, use cues as needed, encourage social interactions.  Continue ongoing supportive care. ?Low back/right hip pain: Related to fall 2 days ago.  Currently using Tylenol 650 mg effective.  Take Tylenol 650 mg twice daily x3  days, and 650 mg every 12 hours as needed for pain.  Continue nonpharmacological methods-ice pack, heat pad, repositioning. ?Gait disturbance: Fall 09/10/18 with no visible injuries. Family does not want patient checked out in the ED. Difficulty getting in and out of bed because bed is low and not adjustable.  ?Recommendation: Patient will benefit from home health PT/OT, and a hospital bed to help getting in and out of bed easily. ? ?Entry by Vance Gather: ?Phone call made to [facility] with update on this visit.   Advised if patient's pain worsens to transport patient to the ED for further evaluation.  Follow up visit will be made on Friday at 1 pm by Vance Gather, RN.  ? ?Follow up: Palliative care will continue to follow for complex medical decision making, advance care planning, and clarification of goals. Return 6 weeks or prn. Encouraged to call provider sooner with any concerns. ? ?HOSPICE ELIGIBILITY/DIAGNOSIS: TBD ? ?Chief Complaint: Initial Palliative care visit ? ?HISTORY OF PRESENT ILLNESS:  Donna Burch is a 88 y.o. year old female  with multiple morbidities requiring close monitoring and with high risk of complications and  mortality: Dementia, hypothyroidism, unsteady gait.  Report of mild to moderate low back/right hip pain following a fall 2 days ago, intermittent, responsive to Tylenol. ?History obtained from review of EMR, discussion with primary team, caregiver, family and/or Donna Burch.  ?Review and summarization of Epic records shows history from other than patient. Rest of 10 point ROS asked and negative.  ?Reviewed as needed, available labs, patient records, imaging, studies and related documents from the EMR. ? ? ? ?  ROS ?General: NAD ?EYES: denies vision changes ?ENMT: denies dysphagia ?Cardiovascular: denies chest pain/discomfort ?Pulmonary: denies cough, denies SOB ?Abdomen: endorses good appetite, denies constipation/diarrhea ?GU: denies dysuria, urinary frequency ?MSK:  +  weakness,  most recent fall was on Sunday in the bedroom. ?Skin: denies rashes or wounds ?Neurological: +occasional mild pain, none at visit; denies insomnia ?Psych: Endorses positive mood ?Heme/lymph/immuno: denies bruises, abnormal bleeding ? ? ?PAST MEDICAL HISTORY:  ?Active Ambulatory Problems  ?  Diagnosis Date Noted  ? Pelvic relaxation 04/08/2012  ? Kidney stones 04/08/2012  ? Recurrent UTI (urinary tract infection) 04/08/2012  ? Thyroid nodule, right 01/03/2015  ? Thyroid nodule 01/03/2015  ? CLL (chronic lymphocytic leukemia) ([place]) 07/14/2015  ? Hypertension   ? Hyperlipidemia   ? GERD (gastroesophageal reflux disease)   ? Hypothyroidism   ? Syncope 09/01/2015  ? Nausea & vomiting 09/01/2015  ? Hyponatremia 09/01/2015  ? Cough 09/01/2015  ? Gastroesophageal reflux disease without esophagitis   ? Thyroid activity decreased   ? Hypomagnesemia   ? Bradycardia   ? Symptomatic bradycardia   ? Junctional escape rhythm   ? Orthostatic hypotension   ? High risk medication use 04/25/2016  ? Inflammatory arthritis 05/29/2016  ? Pain in joint involving multiple sites 05/29/2016  ? Screening-pulmonary TB 05/29/2016  ? Pancreatitis 10/15/2017  ? Gallstone pancreatitis 10/16/2017  ? Common bile duct calculi   ? Primary osteoarthritis of both hands 01/09/2018  ? UTI (urinary tract infection) 03/15/2018  ? Community acquired pneumonia 03/15/2018  ? Syncope and collapse 03/15/2018  ? Atherosclerosis of aorta ([facility]) 08/24/2018  ? Chronic kidney disease, stage III (moderate) ([facility]) 08/24/2018  ? Hearing loss of both ears 08/24/2018  ? Nontoxic goiter 08/24/2018  ? Acute encephalopathy 05/25/2020  ? ?Resolved Ambulatory Problems  ?  Diagnosis Date Noted  ? No Resolved Ambulatory Problems  ? ?Past Medical History:  ?Diagnosis Date  ? Arthritis   ? Dyspnea on exertion   ? Elevated brain natriuretic peptide (BNP) level   ? Family history of adverse reaction to anesthesia   ? Frequent UTI   ? Gastroenteritis 09/2010  ? Hiatal hernia    ? History of Clostridium difficile ?03/2012  ? Nephrolithiasis   ? Nocturia   ? Pneumonia   ? PONV (postoperative nausea and vomiting)   ? Renal insufficiency   ? Seasonal allergies   ? Skin cancer   ? Wears glasses   ? ? ?SOCIAL HX:  ?Social History  ? ?Tobacco Use  ? Smoking status: Never  ? Smokeless tobacco: Never  ?Substance Use Topics  ? Alcohol use: No  ? ?  ?FAMILY HX:  ?Family History  ?Problem Relation Age of Onset  ? Heart attack Mother   ? Hyperlipidemia Mother   ? Asthma Father   ? Diabetes Father   ?   ? ?ALLERGIES:  ?Allergies  ?Allergen Reactions  ? Prednisone Other (See Comments)  ?  "Jittery"  ? Zoledronic Acid Other (See Comments)  ?  Pt did not have a memory, from the day after she took that medication  ?Other reaction(s): fever, stayed in bed for 24 hours  ? Hydrochlorothiazide Other (See Comments)  ?  Caused low sodium blood levels per [facility]  ? Lisinopril Cough  ?   ? ?PERTINENT MEDICATIONS:  ?Outpatient Encounter Medications as of 09/11/2021  ?Medication Sig  ? aspirin 81 MG chewable tablet Take 81 mg by mouth daily.  ? atorvastatin (LIPITOR) 80 MG tablet Take 80 mg by mouth  daily.   ? B Complex-C (SUPER B COMPLEX PO) Take 1 tablet by mouth daily.  ? cephALEXin (KEFLEX) 500 MG capsule Take 1 capsule (500 mg total) by mouth every 12 (twelve) hours. (Patient not taking: Reported on 05/02/2021)  ? cholecalciferol (VITAMIN D) 1000 units tablet Take 2 tablets (2,000 Units total) by mouth daily.  ? ferrous sulfate 325 (65 FE) MG EC tablet Take 1 tablet (325 mg total) by mouth daily with breakfast.  ? fluticasone (FLONASE) 50 MCG/ACT nasal spray Place 1 spray into both nostrils daily. (Patient taking differently: Place 1 spray into both nostrils daily as needed for allergies. )  ? guaiFENesin (MUCINEX) 600 MG 12 hr tablet Take 1 tablet (600 mg total) by mouth 2 (two) times daily. (Patient not taking: Reported on 05/02/2021)  ? Krill Oil (OMEGA-3) 500 MG CAPS Take 500 mg by mouth daily.  ?  levothyroxine (SYNTHROID, LEVOTHROID) 75 MCG tablet Take 37.5-75 mcg by mouth See admin instructions. Take 1 tablet (75 mcg) by mouth on Monday thru Friday mornings before breakfast and take 1/2 tablet (37.5

## 2021-09-12 ENCOUNTER — Other Ambulatory Visit: Payer: Self-pay

## 2021-09-12 ENCOUNTER — Emergency Department (HOSPITAL_BASED_OUTPATIENT_CLINIC_OR_DEPARTMENT_OTHER)
Admission: EM | Admit: 2021-09-12 | Discharge: 2021-09-13 | Disposition: A | Discharge status: Home / Self Care | Payer: Medicare Other | Attending: Emergency Medicine | Admitting: Emergency Medicine

## 2021-09-12 ENCOUNTER — Emergency Department (HOSPITAL_BASED_OUTPATIENT_CLINIC_OR_DEPARTMENT_OTHER): Payer: Medicare Other

## 2021-09-12 ENCOUNTER — Telehealth: Payer: Self-pay

## 2021-09-12 ENCOUNTER — Encounter (HOSPITAL_BASED_OUTPATIENT_CLINIC_OR_DEPARTMENT_OTHER): Payer: Self-pay

## 2021-09-12 ENCOUNTER — Emergency Department (HOSPITAL_BASED_OUTPATIENT_CLINIC_OR_DEPARTMENT_OTHER): Payer: Medicare Other | Admitting: Radiology

## 2021-09-12 DIAGNOSIS — Z79899 Other long term (current) drug therapy: Secondary | ICD-10-CM | POA: Diagnosis not present

## 2021-09-12 DIAGNOSIS — I129 Hypertensive chronic kidney disease with stage 1 through stage 4 chronic kidney disease, or unspecified chronic kidney disease: Secondary | ICD-10-CM | POA: Insufficient documentation

## 2021-09-12 DIAGNOSIS — M25551 Pain in right hip: Secondary | ICD-10-CM | POA: Diagnosis not present

## 2021-09-12 DIAGNOSIS — S7001XA Contusion of right hip, initial encounter: Secondary | ICD-10-CM | POA: Diagnosis not present

## 2021-09-12 DIAGNOSIS — M549 Dorsalgia, unspecified: Secondary | ICD-10-CM | POA: Diagnosis not present

## 2021-09-12 DIAGNOSIS — Z7982 Long term (current) use of aspirin: Secondary | ICD-10-CM | POA: Diagnosis not present

## 2021-09-12 DIAGNOSIS — W06XXXA Fall from bed, initial encounter: Secondary | ICD-10-CM | POA: Diagnosis not present

## 2021-09-12 DIAGNOSIS — E039 Hypothyroidism, unspecified: Secondary | ICD-10-CM | POA: Insufficient documentation

## 2021-09-12 DIAGNOSIS — M545 Low back pain, unspecified: Secondary | ICD-10-CM | POA: Diagnosis not present

## 2021-09-12 DIAGNOSIS — N189 Chronic kidney disease, unspecified: Secondary | ICD-10-CM | POA: Diagnosis not present

## 2021-09-12 DIAGNOSIS — F0390 Unspecified dementia without behavioral disturbance: Secondary | ICD-10-CM | POA: Diagnosis not present

## 2021-09-12 DIAGNOSIS — W19XXXA Unspecified fall, initial encounter: Secondary | ICD-10-CM | POA: Diagnosis not present

## 2021-09-12 DIAGNOSIS — S79911A Unspecified injury of right hip, initial encounter: Secondary | ICD-10-CM | POA: Diagnosis present

## 2021-09-12 MED ORDER — ACETAMINOPHEN 325 MG PO TABS
650.0000 mg | ORAL_TABLET | Freq: Once | ORAL | Status: AC
  Administered 2021-09-12: 650 mg via ORAL
  Filled 2021-09-12: qty 2

## 2021-09-12 NOTE — ED Notes (Signed)
PTAR notified for pt transfer.  

## 2021-09-12 NOTE — ED Triage Notes (Signed)
Patient BIB [facility] from Home with Back and Hip Pain. ? ?Per [facility], Patient was with Caregiver on Sunday when she slipped and fell backwards. Endorses Worsening Discomfort to Lower Back and Right Hip since Fall. ? ?Patient did ambulate to [facility] with Assistance.  ? ?VSS with [facility]. No Anticoagulants.  ? ?Patient uncomfortable in Triage. History of Dementia (Confusion at Baseline). BIB [facility]. ?

## 2021-09-12 NOTE — ED Notes (Signed)
Daughter leaving bedside to prepare home for patient to return via [place]. Consents to travel home via [place]. Aware of DC instructions.  ?

## 2021-09-12 NOTE — Telephone Encounter (Signed)
335 pm.  Message received from [facility] that private caregiver is reporting patient is screaming in pain with movement to the right hip.   Return call made to [facility] and advised patient should be seen in the ED for further evaluation.  Daughter advised she will transport patient for evaluation.  ?

## 2021-09-12 NOTE — ED Provider Notes (Signed)
?[facility] EMERGENCY DEPT ?Provider Note ? ? ?CSN: [id number] ?Arrival date & time: 09/12/21  1647 ? ?  ? ?History ? ?Chief Complaint  ?Patient presents with  ? Fall  ? ? ?Donna Burch is a 88 y.o. female. ? ?Pt is a 88 yo female with dementia, htn, hyperlipidemia, gerd, hypothyroidism, ckd, and CLL.  Pt's daughter gives the hx.  Daughter said she was with her caregiver on Sunday (3/19).  The caregiver was trying to help her put on clothes when she slid down the edge of the bed and hit her right hip.  She did not hit her head.  No loc.  No blood thinners. Pt seems to have pain in that hip, but she is able to ambulate.  Her daughter just wanted her to be checked for fx. ? ? ?  ? ?Home Medications ?Prior to Admission medications   ?Medication Sig Start Date End Date Taking? Authorizing Provider  ?aspirin 81 MG chewable tablet Take 81 mg by mouth daily.    [provider]  ?atorvastatin (LIPITOR) 80 MG tablet Take 80 mg by mouth daily.  07/01/18   [provider]  ?B Complex-C (SUPER B COMPLEX PO) Take 1 tablet by mouth daily.    [provider]  ?cephALEXin (KEFLEX) 500 MG capsule Take 1 capsule (500 mg total) by mouth every 12 (twelve) hours. ?Patient not taking: Reported on 05/02/2021 05/29/20   Hosie Poisson, MD  ?cholecalciferol (VITAMIN D) 1000 units tablet Take 2 tablets (2,000 Units total) by mouth daily. 03/17/18   Florencia Reasons, MD  ?ferrous sulfate 325 (65 FE) MG EC tablet Take 1 tablet (325 mg total) by mouth daily with breakfast. 03/17/18   Florencia Reasons, MD  ?fluticasone (FLONASE) 50 MCG/ACT nasal spray Place 1 spray into both nostrils daily. ?Patient taking differently: Place 1 spray into both nostrils daily as needed for allergies.  03/17/18   Florencia Reasons, MD  ?guaiFENesin (MUCINEX) 600 MG 12 hr tablet Take 1 tablet (600 mg total) by mouth 2 (two) times daily. ?Patient not taking: Reported on 05/02/2021 03/17/18   Florencia Reasons, MD  ?Astrid Drafts (OMEGA-3) 500 MG CAPS Take 500 mg by mouth  daily.    [provider]  ?levothyroxine (SYNTHROID, LEVOTHROID) 75 MCG tablet Take 37.5-75 mcg by mouth See admin instructions. Take 1 tablet (75 mcg) by mouth on Monday thru Friday mornings before breakfast and take 1/2 tablet (37.5 mcg) on Saturday and Sunday before breakfast    [provider]  ?losartan (COZAAR) 100 MG tablet Take 100 mg by mouth daily.    [provider]  ?magnesium gluconate (MAGONATE) 30 MG tablet Take 1 tablet (30 mg total) by mouth daily. ?Patient taking differently: Take 30 mg by mouth 2 (two) times daily. For 2 weeks pt started this regimen 05/22/2020 03/17/18   Florencia Reasons, MD  ?mirtazapine (REMERON) 15 MG tablet Take 15 mg by mouth at bedtime. 03/20/20   [provider]  ?Multiple Vitamins-Minerals (ONE-A-DAY WOMENS 50 PLUS PO) Take 1 tablet by mouth daily.    [provider]  ?omeprazole (PRILOSEC) 40 MG capsule Take 40 mg by mouth daily.     [provider]  ?Probiotic Product ([place]) CAPS Take 1 capsule by mouth daily.    [provider]  ?vitamin B-12 (CYANOCOBALAMIN) 500 MCG tablet Take 1 tablet (500 mcg total) by mouth daily. 05/30/20   Hosie Poisson, MD  ?   ? ?Allergies    ?Prednisone, Zoledronic  acid, Hydrochlorothiazide, and Lisinopril   ? ?Review of Systems   ?Review of Systems  ?Musculoskeletal:   ?     Right hip pain  ?All other systems reviewed and are negative. ? ?Physical Exam ?Updated Vital Signs ?BP (!) 154/54   Pulse 74   Temp 98.7 ?F (37.1 ?C) (Oral)   Resp (!) 21   Ht '5\' 4"'$  (1.626 m)   Wt 54.4 kg   SpO2 100%   BMI 20.59 kg/m?  ?Physical Exam ?Vitals and nursing note reviewed.  ?HENT:  ?   Head: Normocephalic and atraumatic.  ?   Right Ear: External ear normal.  ?   Left Ear: External ear normal.  ?   Mouth/Throat:  ?   Mouth: Mucous membranes are moist.  ?   Pharynx: Oropharynx is clear.  ?Eyes:  ?   Extraocular Movements: Extraocular movements intact.  ?   Conjunctiva/sclera:  Conjunctivae normal.  ?   Pupils: Pupils are equal, round, and reactive to light.  ?Cardiovascular:  ?   Rate and Rhythm: Normal rate and regular rhythm.  ?   Pulses: Normal pulses.  ?   Heart sounds: Normal heart sounds.  ?Pulmonary:  ?   Effort: Pulmonary effort is normal.  ?   Breath sounds: Normal breath sounds.  ?Abdominal:  ?   General: Abdomen is flat. Bowel sounds are normal.  ?   Palpations: Abdomen is soft.  ?Musculoskeletal:  ?   Cervical back: Normal range of motion and neck supple.  ?     Legs: ? ?   Comments: No deformities  ?Skin: ?   General: Skin is warm.  ?   Capillary Refill: Capillary refill takes less than 2 seconds.  ?Neurological:  ?   General: No focal deficit present.  ?   Mental Status: She is alert. Mental status is at baseline.  ?Psychiatric:     ?   Mood and Affect: Mood is anxious.  ? ? ?ED Results / Procedures / Treatments   ?Labs ?(all labs ordered are listed, but only abnormal results are displayed) ?Labs Reviewed - No data to display ? ?EKG ?None ? ?Radiology ?DG Lumbar Spine Complete ? ?Result Date: 09/12/2021 ?CLINICAL DATA:  Back pain after fall EXAM: LUMBAR SPINE - COMPLETE 4+ VIEW COMPARISON:  03/25/2014 FINDINGS: Osteopenia. Five lumbar type vertebral segments. Vertebral body heights and alignment are maintained. No fracture identified. Disc height loss at L4-5. The remaining intervertebral disc spaces are relatively preserved. Minimal degenerative endplate changes. Mild lower lumbar facet arthrosis. Abdominal aortic atherosclerosis. IMPRESSION: 1. No fracture or static listhesis of the lumbar spine. 2. Mild degenerative changes. Electronically Signed   By: Davina Poke D.O.   On: 09/12/2021 17:43  ? ?CT Hip Right Wo Contrast ? ?Result Date: 09/12/2021 ?CLINICAL DATA:  Fall, right hip pain. Clinical suspicion for occult fracture EXAM: CT OF THE RIGHT HIP WITHOUT CONTRAST TECHNIQUE: Multidetector CT imaging of the right hip was performed according to the standard protocol.  Multiplanar CT image reconstructions were also generated. RADIATION DOSE REDUCTION: This exam was performed according to the departmental dose-optimization program which includes automated exposure control, adjustment of the mA and/or kV according to patient size and/or use of iterative reconstruction technique. COMPARISON:  Same-day x-ray FINDINGS: Bones/Joint/Cartilage No acute fracture. No dislocation. Mild-moderate osteoarthritis of the right hip. Trace right hip joint effusion, nonspecific. Moderate arthropathy of the pubic symphysis. No pubic symphysis diastasis. No lytic or sclerotic bone lesion identified. Ligaments Suboptimally assessed by CT. Muscles  and Tendons No acute musculotendinous abnormality by CT. Soft tissues Mild induration within the subcutaneous soft tissues overlying the lateral aspect of the right hip. No organized fluid collection or hematoma. Scattered atherosclerotic vascular calcifications. IMPRESSION: 1. No acute fracture or dislocation of the right hip. 2. Mild induration within the subcutaneous soft tissues overlying the lateral aspect of the right hip, suggestive of a soft tissue contusion. No organized fluid collection or hematoma. 3. Mild-moderate osteoarthritis of the right hip. Electronically Signed   By: Davina Poke D.O.   On: 09/12/2021 18:36  ? ?DG Hip Unilat W or Wo Pelvis 2-3 Views Right ? ?Result Date: 09/12/2021 ?CLINICAL DATA:  Fall, right hip pain EXAM: DG HIP (WITH OR WITHOUT PELVIS) 2-3V RIGHT COMPARISON:  05/25/2020 FINDINGS: Diffuse osseous demineralization. Similar degree of osteoarthritis of the bilateral hips. No definite fracture line is identified. No dislocation. No lytic or sclerotic bone lesion. No soft tissue abnormality. IMPRESSION: Osteopenia. No acute osseous abnormality identified. If high clinical suspicion for occult fracture persists, consider CT or MRI. Electronically Signed   By: Davina Poke D.O.   On: 09/12/2021 17:37    ? ?Procedures ?Procedures  ? ? ?Medications Ordered in ED ?Medications  ?acetaminophen (TYLENOL) tablet 650 mg (650 mg Oral Given 09/12/21 1904)  ? ? ?ED Course/ Medical Decision Making/ A&P ?  ?                        ?Medical Decision

## 2021-09-13 DIAGNOSIS — R4182 Altered mental status, unspecified: Secondary | ICD-10-CM | POA: Diagnosis not present

## 2021-09-13 DIAGNOSIS — Z7401 Bed confinement status: Secondary | ICD-10-CM | POA: Diagnosis not present

## 2021-09-13 DIAGNOSIS — W19XXXA Unspecified fall, initial encounter: Secondary | ICD-10-CM | POA: Diagnosis not present

## 2021-09-13 NOTE — ED Notes (Signed)
PTAR present for pt transfer to home residence, Daughter notified and at home waiting.  ?

## 2021-09-14 ENCOUNTER — Other Ambulatory Visit: Payer: Medicare Other

## 2021-09-14 ENCOUNTER — Other Ambulatory Visit: Payer: Self-pay

## 2021-09-14 DIAGNOSIS — Z515 Encounter for palliative care: Secondary | ICD-10-CM

## 2021-09-14 NOTE — Progress Notes (Signed)
PATIENT NAME: Donna Burch ?DOB: February 10, 1927 ?MRN: [id number] ? ?PRIMARY CARE PROVIDER: Harlan Stains, MD ? ?RESPONSIBLE PARTY:  ?Acct ID - Guarantor Home Phone Work Phone Relationship Acct Type  ?[id number] [facility]* [id number]  Self P/F  ?   [place], [name], [place] [address]  ? ? ?PLAN OF CARE and INTERVENTIONS: ?              1.  GOALS OF CARE/ ADVANCE CARE PLANNING:  Remain home under the care of her family and private sitters.  ?              2.  PATIENT/CAREGIVER EDUCATION:  Disease Progression.  ?              4. PERSONAL EMERGENCY PLAN:  Activate 911 for emergencies. ?              5.  DISEASE STATUS:  Follow up visit completed after ED visit.  Patient was evaluated after a fall that occurred on Sunday with worsening right hip pain throughout the week.  Fracture was ruled out and patient returned home.  She is sitting on the sofa with her private sitter Clarene Critchley.  Patient continues to require assistance with standing but once standing she is able to ambulate with a rolling walker and one person assistance.  She will lean forward on the sofa and fall asleep throughout the visit.  Today, she is talking to her deceased mother.  Clarene Critchley advises that patient does this often and talks about going home.  She was seeing children prior to my visit.   Patient continues to feed herself using her fingers and at times utensils.  Appetite has declined slightly this week.  Patient was weighed on her ED visit which was 119 lbs.  She weighed the same amount in February of 2022.  Clarene Critchley believes her weight has fluctuated from 115-120 lbs since she has been sitting with her for over 1 year now.  We discussed disease progression with dementia.  Discussed use of a wheelchair on days when patient is weaker.  Discussed hospital bed which would allow for better mobility for patient and caregivers.  I will follow up with daughter Sharee Pimple regarding today's visit.  ? ? ?HISTORY OF PRESENT ILLNESS:  MARCIA Burch is a 88  y.o. year old female  with multiple morbidities requiring close monitoring and with high risk of complications and  mortality: Dementia, hypothyroidism, unsteady gait.   Patient is being followed by Palliative Care every 4-8 weeks and PRN.  ? ?CODE STATUS: DNR ?ADVANCED DIRECTIVES: No ?MOST FORM: Yes ?PPS: 40% ? ? ? ? ? ? ? ?Lorenza Burton, RN ? ?

## 2021-09-14 NOTE — Progress Notes (Signed)
COMMUNITY PALLIATIVE CARE SW NOTE ? ?PATIENT NAME: Donna Burch ?DOB: [birth year] ?MRN: [id number] ? ?PRIMARY CARE PROVIDER: Harlan Stains, MD ? ?RESPONSIBLE PARTY:  ?Acct ID - Guarantor Home Phone Work Phone Relationship Acct Type  ?[id number] [facility]* [id number]  Self P/F  ?   [place], [name], [place] [address]  ? ? ? ?PLAN OF CARE and INTERVENTIONS:            ? ?GOALS OF CARE/ ADVANCE CARE PLANNING:  Goals include to maximize quality of life and symptom management. Our advance care planning conversation included a discussion about:    ?The value and importance of advance care planning  ?Review and updating or creation of an advance directive document.  ?            Patient is a DNR. MOST form completed.   ?  ?2.  Palliative care encounter: Palliative medicine team will continue to support patient, patient's family, and medical team. Visit consisted of counseling and education dealing with the complex and emotionally intense issues of symptom management and palliative care in the setting of serious and potentially life-threatening illness. ?  ?SW and RN met with patient and patients caregiver Helene Kelp. Patient resides in a one story home.  ?  ?Functional changes/updates: Patient has had a functional and cognitive decline since last in person PC visit. Multiple falls reported with a recent ED visit due to patient complaining of constant pain. Patient is having more difficulty with STS, has become more MIN-MOD A. Patient is requiring more assistance with ADL's. Patient requires assistance with STS and hygiene patient is more incontinent now. ?   ?Today patient is not as engaging during visit and is visible pain, as she is runting with movements and pointing to her side. Patient is not willing to take tylenol to assist with pain, even with [facility] RN encouragement.  ?  ?  ?Psychosocial assessment: completed. No additional physical needs identified at this time. Ongoing support/resources will continued to be  offered if needed. Patient has private caregivers 7 days a week.  ?  ?Military hx: N/A   ?  ?Medical assessment: Reviewed by RN. ?  ?Hospice discussion: Family interested in Hospice eligibility criteria for patient. PC RN to provide feedback and education. ?  ?SW discussed goals, reviewed care plan, provided emotional support, used active and reflective listening in the form of reciprocity emotional response. Questions and concerns were addressed. The patient/family was encouraged to call with any additional questions and/or concerns. PC Provided general support and encouragement, no other unmet needs identified. Will continue to follow. ?  ?3.         PATIENT/CAREGIVER EDUCATION/ COPING:   ?Appearance: well groomed, appropriate given situation  ?Mental Status: Alert/oriented to self. cognitive deficits present. ?Eye Contact: fair. Patients eyes closed majority of visit, unless addressed directly.  ?Thought Process: irrational  ?Thought Content: not assessed  ?Speech: Normal rate, volume, tone  ?Mood: Normal and calm ?Affect: Congruent to endorsed mood, full ranging ?Insight: poor ?Judgement: poor ?Interaction Style: Cooperative ?  ?Patient A&O to self, unable to hold simple conversation or answer simple Y/N questions this visit. PHQ 9 not completed. Patients family is supportive.  ?  ?4.         PERSONAL EMERGENCY PLAN:  Patient/family will call 9-1-1 for emergencies.  ?  ?5.         COMMUNITY RESOURCES COORDINATION/ HEALTH CARE NAVIGATION:  daughter manages his care. ?  ?6.  FINANCIAL CONCERNS/NEEDS: None. ?                        Primary Health Insurance: Medicare ?Secondary Health Insurance: [place] ?Prescription Coverage: Yes, no history of difficulty obtaining or affording prescriptions reported             ?   ? ?SOCIAL HX:  ?Social History  ? ?Tobacco Use  ? Smoking status: Never  ? Smokeless tobacco: Never  ?Substance Use Topics  ? Alcohol use: No  ? ? ?CODE STATUS: DNR ?ADVANCED DIRECTIVES: Y ?MOST FORM  COMPLETE:  N ?HOSPICE EDUCATION PROVIDED: y ? ?PPS:30-40 % ? ?Time spent: 45 min  ? ? ? ? ? ?Doreene Eland, LCSW ? ?

## 2021-09-17 ENCOUNTER — Telehealth: Payer: Self-pay

## 2021-09-17 NOTE — Telephone Encounter (Signed)
920 am.  Update received from [facility].  Patient has remained in the bed over the weekend and did not eat.   ? ?Dr. Odetta Pink notified of patient's ongoing decline over the last week.  Approval received for hospice referral. ? ?Daughter updated and agrees to plan. ? ?Phone call made to PCP office with request for hospice referral and if PCP will remain attending of record while patient is under hospice.  Response pending.  ?

## 2021-09-17 NOTE — Telephone Encounter (Signed)
12 pm.  Incoming call from [place] with Dr. Orest Dikes office.  Telephone orders received for a hospice referral and PCP will serve as attending of record while patient is under hospice services.  ? ?[facility] referral center notified.  ?

## 2021-09-18 DIAGNOSIS — M810 Age-related osteoporosis without current pathological fracture: Secondary | ICD-10-CM | POA: Diagnosis not present

## 2021-09-18 DIAGNOSIS — E785 Hyperlipidemia, unspecified: Secondary | ICD-10-CM | POA: Diagnosis not present

## 2021-09-18 DIAGNOSIS — I129 Hypertensive chronic kidney disease with stage 1 through stage 4 chronic kidney disease, or unspecified chronic kidney disease: Secondary | ICD-10-CM | POA: Diagnosis not present

## 2021-09-18 DIAGNOSIS — N183 Chronic kidney disease, stage 3 unspecified: Secondary | ICD-10-CM | POA: Diagnosis not present

## 2021-09-18 DIAGNOSIS — E039 Hypothyroidism, unspecified: Secondary | ICD-10-CM | POA: Diagnosis not present

## 2021-09-18 DIAGNOSIS — F0153 Vascular dementia, unspecified severity, with mood disturbance: Secondary | ICD-10-CM | POA: Diagnosis not present

## 2021-09-18 DIAGNOSIS — Z87440 Personal history of urinary (tract) infections: Secondary | ICD-10-CM | POA: Diagnosis not present

## 2021-09-18 DIAGNOSIS — R55 Syncope and collapse: Secondary | ICD-10-CM | POA: Diagnosis not present

## 2021-09-18 DIAGNOSIS — I69818 Other symptoms and signs involving cognitive functions following other cerebrovascular disease: Secondary | ICD-10-CM | POA: Diagnosis not present

## 2021-09-18 DIAGNOSIS — K219 Gastro-esophageal reflux disease without esophagitis: Secondary | ICD-10-CM | POA: Diagnosis not present

## 2021-09-18 DIAGNOSIS — C9110 Chronic lymphocytic leukemia of B-cell type not having achieved remission: Secondary | ICD-10-CM | POA: Diagnosis not present

## 2021-09-18 DIAGNOSIS — J309 Allergic rhinitis, unspecified: Secondary | ICD-10-CM | POA: Diagnosis not present

## 2021-09-19 DIAGNOSIS — I69818 Other symptoms and signs involving cognitive functions following other cerebrovascular disease: Secondary | ICD-10-CM | POA: Diagnosis not present

## 2021-09-19 DIAGNOSIS — N183 Chronic kidney disease, stage 3 unspecified: Secondary | ICD-10-CM | POA: Diagnosis not present

## 2021-09-19 DIAGNOSIS — K219 Gastro-esophageal reflux disease without esophagitis: Secondary | ICD-10-CM | POA: Diagnosis not present

## 2021-09-19 DIAGNOSIS — I129 Hypertensive chronic kidney disease with stage 1 through stage 4 chronic kidney disease, or unspecified chronic kidney disease: Secondary | ICD-10-CM | POA: Diagnosis not present

## 2021-09-19 DIAGNOSIS — F0153 Vascular dementia, unspecified severity, with mood disturbance: Secondary | ICD-10-CM | POA: Diagnosis not present

## 2021-09-19 DIAGNOSIS — E785 Hyperlipidemia, unspecified: Secondary | ICD-10-CM | POA: Diagnosis not present

## 2021-09-20 DIAGNOSIS — N183 Chronic kidney disease, stage 3 unspecified: Secondary | ICD-10-CM | POA: Diagnosis not present

## 2021-09-20 DIAGNOSIS — E785 Hyperlipidemia, unspecified: Secondary | ICD-10-CM | POA: Diagnosis not present

## 2021-09-20 DIAGNOSIS — K219 Gastro-esophageal reflux disease without esophagitis: Secondary | ICD-10-CM | POA: Diagnosis not present

## 2021-09-20 DIAGNOSIS — F0153 Vascular dementia, unspecified severity, with mood disturbance: Secondary | ICD-10-CM | POA: Diagnosis not present

## 2021-09-20 DIAGNOSIS — I69818 Other symptoms and signs involving cognitive functions following other cerebrovascular disease: Secondary | ICD-10-CM | POA: Diagnosis not present

## 2021-09-20 DIAGNOSIS — I129 Hypertensive chronic kidney disease with stage 1 through stage 4 chronic kidney disease, or unspecified chronic kidney disease: Secondary | ICD-10-CM | POA: Diagnosis not present

## 2021-09-21 DIAGNOSIS — F0153 Vascular dementia, unspecified severity, with mood disturbance: Secondary | ICD-10-CM | POA: Diagnosis not present

## 2021-09-21 DIAGNOSIS — I129 Hypertensive chronic kidney disease with stage 1 through stage 4 chronic kidney disease, or unspecified chronic kidney disease: Secondary | ICD-10-CM | POA: Diagnosis not present

## 2021-09-21 DIAGNOSIS — E785 Hyperlipidemia, unspecified: Secondary | ICD-10-CM | POA: Diagnosis not present

## 2021-09-21 DIAGNOSIS — I69818 Other symptoms and signs involving cognitive functions following other cerebrovascular disease: Secondary | ICD-10-CM | POA: Diagnosis not present

## 2021-09-21 DIAGNOSIS — N183 Chronic kidney disease, stage 3 unspecified: Secondary | ICD-10-CM | POA: Diagnosis not present

## 2021-09-21 DIAGNOSIS — K219 Gastro-esophageal reflux disease without esophagitis: Secondary | ICD-10-CM | POA: Diagnosis not present

## 2021-09-22 DIAGNOSIS — I69818 Other symptoms and signs involving cognitive functions following other cerebrovascular disease: Secondary | ICD-10-CM | POA: Diagnosis not present

## 2021-09-22 DIAGNOSIS — C9110 Chronic lymphocytic leukemia of B-cell type not having achieved remission: Secondary | ICD-10-CM | POA: Diagnosis not present

## 2021-09-22 DIAGNOSIS — E039 Hypothyroidism, unspecified: Secondary | ICD-10-CM | POA: Diagnosis not present

## 2021-09-22 DIAGNOSIS — F0153 Vascular dementia, unspecified severity, with mood disturbance: Secondary | ICD-10-CM | POA: Diagnosis not present

## 2021-09-22 DIAGNOSIS — K219 Gastro-esophageal reflux disease without esophagitis: Secondary | ICD-10-CM | POA: Diagnosis not present

## 2021-09-22 DIAGNOSIS — M810 Age-related osteoporosis without current pathological fracture: Secondary | ICD-10-CM | POA: Diagnosis not present

## 2021-09-22 DIAGNOSIS — R55 Syncope and collapse: Secondary | ICD-10-CM | POA: Diagnosis not present

## 2021-09-22 DIAGNOSIS — J309 Allergic rhinitis, unspecified: Secondary | ICD-10-CM | POA: Diagnosis not present

## 2021-09-22 DIAGNOSIS — N183 Chronic kidney disease, stage 3 unspecified: Secondary | ICD-10-CM | POA: Diagnosis not present

## 2021-09-22 DIAGNOSIS — E785 Hyperlipidemia, unspecified: Secondary | ICD-10-CM | POA: Diagnosis not present

## 2021-09-22 DIAGNOSIS — I129 Hypertensive chronic kidney disease with stage 1 through stage 4 chronic kidney disease, or unspecified chronic kidney disease: Secondary | ICD-10-CM | POA: Diagnosis not present

## 2021-09-22 DIAGNOSIS — Z87440 Personal history of urinary (tract) infections: Secondary | ICD-10-CM | POA: Diagnosis not present

## 2021-09-23 DIAGNOSIS — K219 Gastro-esophageal reflux disease without esophagitis: Secondary | ICD-10-CM | POA: Diagnosis not present

## 2021-09-23 DIAGNOSIS — F0153 Vascular dementia, unspecified severity, with mood disturbance: Secondary | ICD-10-CM | POA: Diagnosis not present

## 2021-09-23 DIAGNOSIS — N183 Chronic kidney disease, stage 3 unspecified: Secondary | ICD-10-CM | POA: Diagnosis not present

## 2021-09-23 DIAGNOSIS — E785 Hyperlipidemia, unspecified: Secondary | ICD-10-CM | POA: Diagnosis not present

## 2021-09-23 DIAGNOSIS — I69818 Other symptoms and signs involving cognitive functions following other cerebrovascular disease: Secondary | ICD-10-CM | POA: Diagnosis not present

## 2021-09-23 DIAGNOSIS — I129 Hypertensive chronic kidney disease with stage 1 through stage 4 chronic kidney disease, or unspecified chronic kidney disease: Secondary | ICD-10-CM | POA: Diagnosis not present

## 2021-09-24 DIAGNOSIS — E785 Hyperlipidemia, unspecified: Secondary | ICD-10-CM | POA: Diagnosis not present

## 2021-09-24 DIAGNOSIS — N183 Chronic kidney disease, stage 3 unspecified: Secondary | ICD-10-CM | POA: Diagnosis not present

## 2021-09-24 DIAGNOSIS — F0153 Vascular dementia, unspecified severity, with mood disturbance: Secondary | ICD-10-CM | POA: Diagnosis not present

## 2021-09-24 DIAGNOSIS — I129 Hypertensive chronic kidney disease with stage 1 through stage 4 chronic kidney disease, or unspecified chronic kidney disease: Secondary | ICD-10-CM | POA: Diagnosis not present

## 2021-09-24 DIAGNOSIS — K219 Gastro-esophageal reflux disease without esophagitis: Secondary | ICD-10-CM | POA: Diagnosis not present

## 2021-09-24 DIAGNOSIS — I69818 Other symptoms and signs involving cognitive functions following other cerebrovascular disease: Secondary | ICD-10-CM | POA: Diagnosis not present

## 2021-09-25 DIAGNOSIS — N183 Chronic kidney disease, stage 3 unspecified: Secondary | ICD-10-CM | POA: Diagnosis not present

## 2021-09-25 DIAGNOSIS — K219 Gastro-esophageal reflux disease without esophagitis: Secondary | ICD-10-CM | POA: Diagnosis not present

## 2021-09-25 DIAGNOSIS — F0153 Vascular dementia, unspecified severity, with mood disturbance: Secondary | ICD-10-CM | POA: Diagnosis not present

## 2021-09-25 DIAGNOSIS — I129 Hypertensive chronic kidney disease with stage 1 through stage 4 chronic kidney disease, or unspecified chronic kidney disease: Secondary | ICD-10-CM | POA: Diagnosis not present

## 2021-09-25 DIAGNOSIS — E785 Hyperlipidemia, unspecified: Secondary | ICD-10-CM | POA: Diagnosis not present

## 2021-09-25 DIAGNOSIS — I69818 Other symptoms and signs involving cognitive functions following other cerebrovascular disease: Secondary | ICD-10-CM | POA: Diagnosis not present

## 2021-09-26 DIAGNOSIS — I129 Hypertensive chronic kidney disease with stage 1 through stage 4 chronic kidney disease, or unspecified chronic kidney disease: Secondary | ICD-10-CM | POA: Diagnosis not present

## 2021-09-26 DIAGNOSIS — N183 Chronic kidney disease, stage 3 unspecified: Secondary | ICD-10-CM | POA: Diagnosis not present

## 2021-09-26 DIAGNOSIS — E785 Hyperlipidemia, unspecified: Secondary | ICD-10-CM | POA: Diagnosis not present

## 2021-09-26 DIAGNOSIS — I69818 Other symptoms and signs involving cognitive functions following other cerebrovascular disease: Secondary | ICD-10-CM | POA: Diagnosis not present

## 2021-09-26 DIAGNOSIS — F0153 Vascular dementia, unspecified severity, with mood disturbance: Secondary | ICD-10-CM | POA: Diagnosis not present

## 2021-09-26 DIAGNOSIS — K219 Gastro-esophageal reflux disease without esophagitis: Secondary | ICD-10-CM | POA: Diagnosis not present

## 2021-10-22 DEATH — deceased
